# Patient Record
Sex: Female | Born: 1935 | Race: White | Hispanic: No | Smoking: Former smoker
Health system: Southern US, Community
[De-identification: ages and names within clinical notes are randomized; demographics above are authoritative.]

## PROBLEM LIST (undated history)

## (undated) DIAGNOSIS — E039 Hypothyroidism, unspecified: Secondary | ICD-10-CM

## (undated) DIAGNOSIS — I5042 Chronic combined systolic (congestive) and diastolic (congestive) heart failure: Secondary | ICD-10-CM

## (undated) DIAGNOSIS — I4891 Unspecified atrial fibrillation: Principal | ICD-10-CM

## (undated) DIAGNOSIS — K648 Other hemorrhoids: Secondary | ICD-10-CM

## (undated) DIAGNOSIS — M1990 Unspecified osteoarthritis, unspecified site: Secondary | ICD-10-CM

## (undated) DIAGNOSIS — N183 Chronic kidney disease, stage 3 unspecified: Secondary | ICD-10-CM

## (undated) DIAGNOSIS — S6291XA Unspecified fracture of right wrist and hand, initial encounter for closed fracture: Secondary | ICD-10-CM

## (undated) DIAGNOSIS — N63 Unspecified lump in unspecified breast: Secondary | ICD-10-CM

## (undated) DIAGNOSIS — N329 Bladder disorder, unspecified: Secondary | ICD-10-CM

## (undated) DIAGNOSIS — K5730 Diverticulosis of large intestine without perforation or abscess without bleeding: Secondary | ICD-10-CM

## (undated) DIAGNOSIS — R040 Epistaxis: Secondary | ICD-10-CM

## (undated) DIAGNOSIS — N3010 Interstitial cystitis (chronic) without hematuria: Secondary | ICD-10-CM

## (undated) DIAGNOSIS — I447 Left bundle-branch block, unspecified: Secondary | ICD-10-CM

## (undated) DIAGNOSIS — I2510 Atherosclerotic heart disease of native coronary artery without angina pectoris: Secondary | ICD-10-CM

## (undated) DIAGNOSIS — Z9889 Other specified postprocedural states: Secondary | ICD-10-CM

## (undated) DIAGNOSIS — I951 Orthostatic hypotension: Secondary | ICD-10-CM

## (undated) DIAGNOSIS — I10 Essential (primary) hypertension: Secondary | ICD-10-CM

## (undated) DIAGNOSIS — E785 Hyperlipidemia, unspecified: Secondary | ICD-10-CM

## (undated) DIAGNOSIS — R001 Bradycardia, unspecified: Secondary | ICD-10-CM

## (undated) DIAGNOSIS — K219 Gastro-esophageal reflux disease without esophagitis: Secondary | ICD-10-CM

## (undated) HISTORY — PX: APPENDECTOMY: SHX54

## (undated) HISTORY — DX: Interstitial cystitis (chronic) without hematuria: N30.10

## (undated) HISTORY — DX: Chronic combined systolic (congestive) and diastolic (congestive) heart failure: I50.42

## (undated) HISTORY — DX: Other specified postprocedural states: Z98.890

## (undated) HISTORY — PX: CHOLECYSTECTOMY: SHX55

## (undated) HISTORY — PX: BREAST SURGERY: SHX581

## (undated) HISTORY — PX: ABDOMINAL HYSTERECTOMY: SHX81

## (undated) HISTORY — DX: Bladder disorder, unspecified: N32.9

## (undated) HISTORY — PX: ANKLE RECONSTRUCTION: SHX1151

## (undated) HISTORY — DX: Gastro-esophageal reflux disease without esophagitis: K21.9

## (undated) HISTORY — PX: WRIST RECONSTRUCTION: SHX2675

## (undated) HISTORY — PX: CATARACT EXTRACTION, BILATERAL: SHX1313

## (undated) HISTORY — DX: Unspecified fracture of right wrist and hand, initial encounter for closed fracture: S62.91XA

## (undated) HISTORY — PX: TONSILLECTOMY: SHX5217

## (undated) HISTORY — DX: Chronic kidney disease, stage 3 (moderate): N18.3

## (undated) HISTORY — DX: Essential (primary) hypertension: I10

## (undated) HISTORY — DX: Unspecified lump in unspecified breast: N63.0

## (undated) HISTORY — DX: Atherosclerotic heart disease of native coronary artery without angina pectoris: I25.10

## (undated) HISTORY — DX: Hypothyroidism, unspecified: E03.9

## (undated) HISTORY — DX: Other hemorrhoids: K64.8

## (undated) HISTORY — DX: Epistaxis: R04.0

## (undated) HISTORY — DX: Chronic kidney disease, stage 3 unspecified: N18.30

## (undated) HISTORY — DX: Diverticulosis of large intestine without perforation or abscess without bleeding: K57.30

## (undated) HISTORY — DX: Hyperlipidemia, unspecified: E78.5

## (undated) HISTORY — DX: Unspecified osteoarthritis, unspecified site: M19.90

---

## 1998-12-14 ENCOUNTER — Encounter: Payer: Self-pay | Admitting: Emergency Medicine

## 1998-12-14 ENCOUNTER — Emergency Department (HOSPITAL_COMMUNITY): Admission: EM | Admit: 1998-12-14 | Discharge: 1998-12-14 | Payer: Self-pay | Admitting: Emergency Medicine

## 2000-02-14 ENCOUNTER — Encounter: Payer: Self-pay | Admitting: *Deleted

## 2000-02-14 ENCOUNTER — Encounter: Admission: RE | Admit: 2000-02-14 | Discharge: 2000-02-14 | Payer: Self-pay | Admitting: *Deleted

## 2000-06-05 ENCOUNTER — Inpatient Hospital Stay (HOSPITAL_COMMUNITY): Admission: AD | Admit: 2000-06-05 | Discharge: 2000-06-09 | Payer: Self-pay | Admitting: Cardiology

## 2000-06-23 ENCOUNTER — Encounter (HOSPITAL_COMMUNITY): Admission: RE | Admit: 2000-06-23 | Discharge: 2000-09-21 | Payer: Self-pay | Admitting: Cardiology

## 2001-03-29 ENCOUNTER — Other Ambulatory Visit: Admission: RE | Admit: 2001-03-29 | Discharge: 2001-03-29 | Payer: Self-pay | Admitting: Gynecology

## 2002-11-01 ENCOUNTER — Ambulatory Visit (HOSPITAL_COMMUNITY): Admission: RE | Admit: 2002-11-01 | Discharge: 2002-11-02 | Payer: Self-pay | Admitting: Cardiology

## 2003-09-08 ENCOUNTER — Ambulatory Visit (HOSPITAL_COMMUNITY): Admission: RE | Admit: 2003-09-08 | Discharge: 2003-09-08 | Payer: Self-pay | Admitting: Cardiology

## 2003-10-03 ENCOUNTER — Encounter: Payer: Self-pay | Admitting: Gastroenterology

## 2003-11-30 ENCOUNTER — Emergency Department (HOSPITAL_COMMUNITY): Admission: EM | Admit: 2003-11-30 | Discharge: 2003-12-01 | Payer: Self-pay | Admitting: Emergency Medicine

## 2003-12-01 ENCOUNTER — Inpatient Hospital Stay (HOSPITAL_COMMUNITY): Admission: EM | Admit: 2003-12-01 | Discharge: 2003-12-11 | Payer: Self-pay | Admitting: Specialist

## 2004-04-03 ENCOUNTER — Other Ambulatory Visit: Admission: RE | Admit: 2004-04-03 | Discharge: 2004-04-03 | Payer: Self-pay | Admitting: Obstetrics and Gynecology

## 2004-09-24 ENCOUNTER — Ambulatory Visit: Payer: Self-pay | Admitting: Internal Medicine

## 2004-12-19 ENCOUNTER — Ambulatory Visit: Payer: Self-pay | Admitting: Internal Medicine

## 2005-02-20 ENCOUNTER — Ambulatory Visit: Payer: Self-pay | Admitting: Internal Medicine

## 2005-03-18 ENCOUNTER — Ambulatory Visit: Payer: Self-pay | Admitting: Internal Medicine

## 2005-03-20 ENCOUNTER — Ambulatory Visit: Payer: Self-pay | Admitting: Cardiology

## 2005-03-21 ENCOUNTER — Ambulatory Visit: Payer: Self-pay

## 2005-03-27 ENCOUNTER — Ambulatory Visit: Payer: Self-pay | Admitting: Cardiology

## 2005-07-15 ENCOUNTER — Ambulatory Visit: Payer: Self-pay | Admitting: Cardiology

## 2005-07-22 ENCOUNTER — Ambulatory Visit: Payer: Self-pay | Admitting: Cardiology

## 2005-08-18 ENCOUNTER — Ambulatory Visit: Payer: Self-pay | Admitting: Internal Medicine

## 2005-09-17 ENCOUNTER — Ambulatory Visit: Payer: Self-pay | Admitting: Internal Medicine

## 2005-09-18 ENCOUNTER — Ambulatory Visit: Payer: Self-pay | Admitting: Cardiology

## 2005-10-01 ENCOUNTER — Ambulatory Visit: Payer: Self-pay | Admitting: Gastroenterology

## 2005-10-16 ENCOUNTER — Ambulatory Visit: Payer: Self-pay | Admitting: Gastroenterology

## 2005-10-16 ENCOUNTER — Encounter (INDEPENDENT_AMBULATORY_CARE_PROVIDER_SITE_OTHER): Payer: Self-pay | Admitting: *Deleted

## 2005-11-19 ENCOUNTER — Ambulatory Visit: Payer: Self-pay | Admitting: Internal Medicine

## 2005-11-21 ENCOUNTER — Ambulatory Visit: Payer: Self-pay | Admitting: Internal Medicine

## 2005-12-22 ENCOUNTER — Ambulatory Visit: Payer: Self-pay | Admitting: Internal Medicine

## 2006-03-23 ENCOUNTER — Ambulatory Visit: Payer: Self-pay | Admitting: Internal Medicine

## 2006-03-25 ENCOUNTER — Ambulatory Visit: Payer: Self-pay | Admitting: Cardiology

## 2006-05-25 ENCOUNTER — Ambulatory Visit: Payer: Self-pay | Admitting: Internal Medicine

## 2006-07-14 ENCOUNTER — Ambulatory Visit: Payer: Self-pay | Admitting: Internal Medicine

## 2006-07-17 ENCOUNTER — Ambulatory Visit: Payer: Self-pay | Admitting: Internal Medicine

## 2006-09-17 ENCOUNTER — Ambulatory Visit: Payer: Self-pay | Admitting: Internal Medicine

## 2006-11-17 ENCOUNTER — Ambulatory Visit: Payer: Self-pay | Admitting: Internal Medicine

## 2006-11-17 LAB — CONVERTED CEMR LAB
BUN: 28 mg/dL — ABNORMAL HIGH (ref 6–23)
Basophils Absolute: 0 10*3/uL (ref 0.0–0.1)
Basophils Relative: 0.8 % (ref 0.0–1.0)
Creatinine, Ser: 1.3 mg/dL — ABNORMAL HIGH (ref 0.4–1.2)
Eosinophil percent: 2.3 % (ref 0.0–5.0)
HCT: 37 % (ref 36.0–46.0)
Hemoglobin: 12.5 g/dL (ref 12.0–15.0)
Lymphocytes Relative: 31.2 % (ref 12.0–46.0)
MCHC: 33.7 g/dL (ref 30.0–36.0)
MCV: 87 fL (ref 78.0–100.0)
Monocytes Absolute: 0.4 10*3/uL (ref 0.2–0.7)
Monocytes Relative: 7.4 % (ref 3.0–11.0)
Neutro Abs: 3.1 10*3/uL (ref 1.4–7.7)
Neutrophils Relative %: 58.3 % (ref 43.0–77.0)
Platelets: 221 10*3/uL (ref 150–400)
Potassium: 3.6 meq/L (ref 3.5–5.1)
RBC: 4.25 M/uL (ref 3.87–5.11)
RDW: 12.5 % (ref 11.5–14.6)
TSH: 0.48 microintl units/mL (ref 0.35–5.50)
WBC: 5.3 10*3/uL (ref 4.5–10.5)

## 2007-02-24 ENCOUNTER — Ambulatory Visit: Payer: Self-pay | Admitting: Internal Medicine

## 2007-06-07 ENCOUNTER — Encounter: Payer: Self-pay | Admitting: Internal Medicine

## 2007-08-03 ENCOUNTER — Ambulatory Visit: Payer: Self-pay | Admitting: Internal Medicine

## 2007-08-03 DIAGNOSIS — E039 Hypothyroidism, unspecified: Secondary | ICD-10-CM

## 2007-08-03 DIAGNOSIS — T887XXA Unspecified adverse effect of drug or medicament, initial encounter: Secondary | ICD-10-CM

## 2007-08-03 DIAGNOSIS — I25119 Atherosclerotic heart disease of native coronary artery with unspecified angina pectoris: Secondary | ICD-10-CM

## 2007-08-03 DIAGNOSIS — Z8719 Personal history of other diseases of the digestive system: Secondary | ICD-10-CM

## 2007-08-03 DIAGNOSIS — N3289 Other specified disorders of bladder: Secondary | ICD-10-CM

## 2007-08-03 DIAGNOSIS — I10 Essential (primary) hypertension: Secondary | ICD-10-CM | POA: Insufficient documentation

## 2007-08-03 DIAGNOSIS — K5730 Diverticulosis of large intestine without perforation or abscess without bleeding: Secondary | ICD-10-CM | POA: Insufficient documentation

## 2007-08-03 HISTORY — DX: Essential (primary) hypertension: I10

## 2007-08-06 ENCOUNTER — Telehealth: Payer: Self-pay | Admitting: Internal Medicine

## 2007-08-06 ENCOUNTER — Encounter: Payer: Self-pay | Admitting: Internal Medicine

## 2007-08-31 ENCOUNTER — Ambulatory Visit (HOSPITAL_COMMUNITY): Admission: RE | Admit: 2007-08-31 | Discharge: 2007-08-31 | Payer: Self-pay | Admitting: Urology

## 2007-08-31 ENCOUNTER — Encounter: Payer: Self-pay | Admitting: Urology

## 2007-09-09 ENCOUNTER — Ambulatory Visit: Payer: Self-pay | Admitting: Internal Medicine

## 2007-09-16 ENCOUNTER — Encounter: Payer: Self-pay | Admitting: Internal Medicine

## 2007-09-30 ENCOUNTER — Telehealth: Payer: Self-pay | Admitting: Internal Medicine

## 2007-10-15 ENCOUNTER — Encounter: Payer: Self-pay | Admitting: Internal Medicine

## 2007-10-18 ENCOUNTER — Ambulatory Visit: Payer: Self-pay | Admitting: Internal Medicine

## 2007-10-18 DIAGNOSIS — K219 Gastro-esophageal reflux disease without esophagitis: Secondary | ICD-10-CM

## 2007-11-23 ENCOUNTER — Ambulatory Visit: Payer: Self-pay | Admitting: Internal Medicine

## 2007-11-23 LAB — CONVERTED CEMR LAB: TSH: 0.64 microintl units/mL (ref 0.35–5.50)

## 2008-01-14 ENCOUNTER — Encounter: Payer: Self-pay | Admitting: Internal Medicine

## 2008-02-22 ENCOUNTER — Ambulatory Visit: Payer: Self-pay | Admitting: Internal Medicine

## 2008-02-22 DIAGNOSIS — R197 Diarrhea, unspecified: Secondary | ICD-10-CM

## 2008-03-07 ENCOUNTER — Ambulatory Visit: Payer: Self-pay | Admitting: Internal Medicine

## 2008-03-07 DIAGNOSIS — E785 Hyperlipidemia, unspecified: Secondary | ICD-10-CM | POA: Insufficient documentation

## 2008-03-07 HISTORY — DX: Hyperlipidemia, unspecified: E78.5

## 2008-04-05 ENCOUNTER — Encounter: Admission: RE | Admit: 2008-04-05 | Discharge: 2008-04-05 | Payer: Self-pay | Admitting: Obstetrics and Gynecology

## 2008-04-11 ENCOUNTER — Encounter: Admission: RE | Admit: 2008-04-11 | Discharge: 2008-04-11 | Payer: Self-pay | Admitting: Obstetrics and Gynecology

## 2008-04-17 ENCOUNTER — Encounter: Payer: Self-pay | Admitting: Internal Medicine

## 2008-08-14 ENCOUNTER — Telehealth: Payer: Self-pay | Admitting: Internal Medicine

## 2008-08-15 ENCOUNTER — Telehealth (INDEPENDENT_AMBULATORY_CARE_PROVIDER_SITE_OTHER): Payer: Self-pay | Admitting: *Deleted

## 2008-08-16 ENCOUNTER — Ambulatory Visit: Payer: Self-pay | Admitting: Internal Medicine

## 2008-08-16 LAB — CONVERTED CEMR LAB
BUN: 23 mg/dL (ref 6–23)
Basophils Absolute: 0 10*3/uL (ref 0.0–0.1)
Basophils Relative: 0.9 % (ref 0.0–3.0)
CO2: 30 meq/L (ref 19–32)
Calcium: 9.2 mg/dL (ref 8.4–10.5)
Chloride: 108 meq/L (ref 96–112)
Creatinine, Ser: 1.1 mg/dL (ref 0.4–1.2)
Eosinophils Absolute: 0.2 10*3/uL (ref 0.0–0.7)
Eosinophils Relative: 3.5 % (ref 0.0–5.0)
GFR calc Af Amer: 61 mL/min
GFR calc non Af Amer: 51 mL/min
Glucose, Bld: 103 mg/dL — ABNORMAL HIGH (ref 70–99)
HCT: 35.5 % — ABNORMAL LOW (ref 36.0–46.0)
Hemoglobin: 12.2 g/dL (ref 12.0–15.0)
Lymphocytes Relative: 25.3 % (ref 12.0–46.0)
MCHC: 34.3 g/dL (ref 30.0–36.0)
MCV: 87.8 fL (ref 78.0–100.0)
Monocytes Absolute: 0.4 10*3/uL (ref 0.1–1.0)
Monocytes Relative: 7.4 % (ref 3.0–12.0)
Neutro Abs: 3.1 10*3/uL (ref 1.4–7.7)
Neutrophils Relative %: 62.9 % (ref 43.0–77.0)
Platelets: 188 10*3/uL (ref 150–400)
Potassium: 3.8 meq/L (ref 3.5–5.1)
RBC: 4.04 M/uL (ref 3.87–5.11)
RDW: 12.5 % (ref 11.5–14.6)
Sodium: 144 meq/L (ref 135–145)
TSH: 0.68 microintl units/mL (ref 0.35–5.50)
WBC: 5 10*3/uL (ref 4.5–10.5)

## 2008-08-24 ENCOUNTER — Ambulatory Visit: Payer: Self-pay

## 2008-08-25 ENCOUNTER — Encounter: Payer: Self-pay | Admitting: Internal Medicine

## 2008-08-25 ENCOUNTER — Ambulatory Visit: Payer: Self-pay | Admitting: Internal Medicine

## 2008-08-25 LAB — HM DEXA SCAN

## 2008-08-29 ENCOUNTER — Telehealth: Payer: Self-pay | Admitting: Internal Medicine

## 2008-09-11 ENCOUNTER — Encounter: Payer: Self-pay | Admitting: Internal Medicine

## 2008-10-02 ENCOUNTER — Encounter: Payer: Self-pay | Admitting: Internal Medicine

## 2008-10-12 ENCOUNTER — Encounter: Payer: Self-pay | Admitting: Internal Medicine

## 2008-11-15 ENCOUNTER — Ambulatory Visit: Payer: Self-pay | Admitting: Internal Medicine

## 2008-11-16 ENCOUNTER — Encounter: Payer: Self-pay | Admitting: Internal Medicine

## 2008-11-16 ENCOUNTER — Ambulatory Visit: Payer: Self-pay | Admitting: Gastroenterology

## 2008-11-30 ENCOUNTER — Telehealth: Payer: Self-pay | Admitting: Internal Medicine

## 2008-12-21 ENCOUNTER — Ambulatory Visit: Payer: Self-pay | Admitting: Internal Medicine

## 2008-12-21 DIAGNOSIS — R040 Epistaxis: Secondary | ICD-10-CM | POA: Insufficient documentation

## 2008-12-21 LAB — CONVERTED CEMR LAB
BUN: 33 mg/dL — ABNORMAL HIGH (ref 6–23)
CO2: 27 meq/L (ref 19–32)
Calcium: 9.5 mg/dL (ref 8.4–10.5)
Chloride: 105 meq/L (ref 96–112)
Cholesterol: 131 mg/dL (ref 0–200)
Creatinine, Ser: 1.1 mg/dL (ref 0.4–1.2)
Direct LDL: 60.8 mg/dL
GFR calc Af Amer: 61 mL/min
GFR calc non Af Amer: 51 mL/min
Glucose, Bld: 88 mg/dL (ref 70–99)
HDL: 48.1 mg/dL (ref >39.0)
Potassium: 4 meq/L (ref 3.5–5.1)
Sodium: 140 meq/L (ref 135–145)
TSH: 0.92 microintl units/mL (ref 0.35–5.50)

## 2009-01-29 ENCOUNTER — Encounter: Payer: Self-pay | Admitting: Internal Medicine

## 2009-02-02 ENCOUNTER — Encounter: Payer: Self-pay | Admitting: Internal Medicine

## 2009-02-17 ENCOUNTER — Encounter: Payer: Self-pay | Admitting: Internal Medicine

## 2009-02-19 ENCOUNTER — Ambulatory Visit: Payer: Self-pay | Admitting: Internal Medicine

## 2009-02-19 DIAGNOSIS — R609 Edema, unspecified: Secondary | ICD-10-CM | POA: Insufficient documentation

## 2009-03-21 ENCOUNTER — Ambulatory Visit: Payer: Self-pay | Admitting: Internal Medicine

## 2009-04-12 ENCOUNTER — Encounter: Admission: RE | Admit: 2009-04-12 | Discharge: 2009-04-12 | Payer: Self-pay | Admitting: Internal Medicine

## 2009-04-16 ENCOUNTER — Encounter: Payer: Self-pay | Admitting: Internal Medicine

## 2009-05-23 ENCOUNTER — Ambulatory Visit: Payer: Self-pay | Admitting: Internal Medicine

## 2009-05-23 DIAGNOSIS — Z9889 Other specified postprocedural states: Secondary | ICD-10-CM

## 2009-05-23 HISTORY — DX: Other specified postprocedural states: Z98.890

## 2009-07-24 ENCOUNTER — Ambulatory Visit: Payer: Self-pay | Admitting: Internal Medicine

## 2009-09-17 ENCOUNTER — Ambulatory Visit: Payer: Self-pay | Admitting: Internal Medicine

## 2009-09-17 LAB — CONVERTED CEMR LAB
ALT: 19 units/L (ref 0–35)
AST: 26 units/L (ref 0–37)
Albumin: 4 g/dL (ref 3.5–5.2)
Alkaline Phosphatase: 73 units/L (ref 39–117)
BUN: 20 mg/dL (ref 6–23)
Bilirubin, Direct: 0.1 mg/dL (ref 0.0–0.3)
CO2: 27 meq/L (ref 19–32)
Calcium: 9.7 mg/dL (ref 8.4–10.5)
Chloride: 108 meq/L (ref 96–112)
Cholesterol: 143 mg/dL (ref 0–200)
Creatinine, Ser: 1 mg/dL (ref 0.4–1.2)
GFR calc non Af Amer: 56.31 mL/min (ref >60)
Glucose, Bld: 110 mg/dL — ABNORMAL HIGH (ref 70–99)
HDL: 53.4 mg/dL (ref >39.00)
LDL Cholesterol: 70 mg/dL (ref 0–99)
Potassium: 4.3 meq/L (ref 3.5–5.1)
Sodium: 144 meq/L (ref 135–145)
TSH: 1.09 microintl units/mL (ref 0.35–5.50)
Total Bilirubin: 0.8 mg/dL (ref 0.3–1.2)
Total CHOL/HDL Ratio: 3
Total Protein: 7.5 g/dL (ref 6.0–8.3)
Triglycerides: 99 mg/dL (ref 0.0–149.0)
VLDL: 19.8 mg/dL (ref 0.0–40.0)

## 2009-09-25 ENCOUNTER — Ambulatory Visit: Payer: Self-pay | Admitting: Internal Medicine

## 2009-10-17 ENCOUNTER — Encounter (INDEPENDENT_AMBULATORY_CARE_PROVIDER_SITE_OTHER): Payer: Self-pay | Admitting: *Deleted

## 2009-10-23 ENCOUNTER — Encounter (INDEPENDENT_AMBULATORY_CARE_PROVIDER_SITE_OTHER): Payer: Self-pay | Admitting: *Deleted

## 2009-12-25 ENCOUNTER — Ambulatory Visit: Payer: Self-pay | Admitting: Internal Medicine

## 2010-02-06 ENCOUNTER — Telehealth: Payer: Self-pay | Admitting: Internal Medicine

## 2010-02-19 ENCOUNTER — Ambulatory Visit: Payer: Self-pay | Admitting: Internal Medicine

## 2010-02-19 LAB — CONVERTED CEMR LAB
BUN: 17 mg/dL (ref 6–23)
Basophils Absolute: 0 10*3/uL (ref 0.0–0.1)
Basophils Relative: 0.4 % (ref 0.0–3.0)
CO2: 30 meq/L (ref 19–32)
Calcium: 9.3 mg/dL (ref 8.4–10.5)
Chloride: 107 meq/L (ref 96–112)
Cholesterol: 140 mg/dL (ref 0–200)
Creatinine, Ser: 0.9 mg/dL (ref 0.4–1.2)
Direct LDL: 81 mg/dL
Eosinophils Absolute: 0.3 10*3/uL (ref 0.0–0.7)
Eosinophils Relative: 4.9 % (ref 0.0–5.0)
GFR calc non Af Amer: 63.52 mL/min (ref >60)
Glucose, Bld: 100 mg/dL — ABNORMAL HIGH (ref 70–99)
HCT: 36.8 % (ref 36.0–46.0)
HDL: 55.4 mg/dL (ref >39.00)
Hemoglobin: 12.5 g/dL (ref 12.0–15.0)
Lymphocytes Relative: 24.9 % (ref 12.0–46.0)
Lymphs Abs: 1.7 10*3/uL (ref 0.7–4.0)
MCHC: 34.1 g/dL (ref 30.0–36.0)
MCV: 87.5 fL (ref 78.0–100.0)
Monocytes Absolute: 0.4 10*3/uL (ref 0.1–1.0)
Monocytes Relative: 6.3 % (ref 3.0–12.0)
Neutro Abs: 4.2 10*3/uL (ref 1.4–7.7)
Neutrophils Relative %: 63.5 % (ref 43.0–77.0)
Platelets: 217 10*3/uL (ref 150.0–400.0)
Potassium: 3.6 meq/L (ref 3.5–5.1)
RBC: 4.21 M/uL (ref 3.87–5.11)
RDW: 14.6 % (ref 11.5–14.6)
Sodium: 145 meq/L (ref 135–145)
TSH: 0.85 microintl units/mL (ref 0.35–5.50)
WBC: 6.7 10*3/uL (ref 4.5–10.5)

## 2010-04-09 ENCOUNTER — Telehealth: Payer: Self-pay | Admitting: Internal Medicine

## 2010-04-16 ENCOUNTER — Encounter: Admission: RE | Admit: 2010-04-16 | Discharge: 2010-04-16 | Payer: Self-pay | Admitting: Internal Medicine

## 2010-04-17 ENCOUNTER — Encounter: Payer: Self-pay | Admitting: Internal Medicine

## 2010-05-08 ENCOUNTER — Telehealth: Payer: Self-pay | Admitting: Internal Medicine

## 2010-05-31 ENCOUNTER — Ambulatory Visit: Payer: Self-pay | Admitting: Internal Medicine

## 2010-06-14 ENCOUNTER — Encounter: Payer: Self-pay | Admitting: Internal Medicine

## 2010-07-26 ENCOUNTER — Telehealth: Payer: Self-pay | Admitting: Internal Medicine

## 2010-09-04 ENCOUNTER — Ambulatory Visit: Payer: Self-pay | Admitting: Internal Medicine

## 2010-09-04 LAB — CONVERTED CEMR LAB
ALT: 13 units/L (ref 0–35)
AST: 18 units/L (ref 0–37)
Albumin: 3.5 g/dL (ref 3.5–5.2)
Alkaline Phosphatase: 76 units/L (ref 39–117)
BUN: 14 mg/dL (ref 6–23)
Bilirubin, Direct: 0.1 mg/dL (ref 0.0–0.3)
CO2: 27 meq/L (ref 19–32)
Calcium: 8.9 mg/dL (ref 8.4–10.5)
Chloride: 105 meq/L (ref 96–112)
Cholesterol: 133 mg/dL (ref 0–200)
Creatinine, Ser: 1 mg/dL (ref 0.4–1.2)
GFR calc non Af Amer: 56.83 mL/min (ref >60)
Glucose, Bld: 97 mg/dL (ref 70–99)
HDL: 40.9 mg/dL (ref >39.00)
LDL Cholesterol: 73 mg/dL (ref 0–99)
Potassium: 4.2 meq/L (ref 3.5–5.1)
Sodium: 140 meq/L (ref 135–145)
TSH: 1.22 microintl units/mL (ref 0.35–5.50)
Total Bilirubin: 0.5 mg/dL (ref 0.3–1.2)
Total CHOL/HDL Ratio: 3
Total Protein: 6.4 g/dL (ref 6.0–8.3)
Triglycerides: 98 mg/dL (ref 0.0–149.0)
VLDL: 19.6 mg/dL (ref 0.0–40.0)

## 2010-09-11 ENCOUNTER — Ambulatory Visit: Payer: Self-pay | Admitting: Internal Medicine

## 2010-09-20 ENCOUNTER — Encounter: Payer: Self-pay | Admitting: Internal Medicine

## 2010-11-19 ENCOUNTER — Encounter: Payer: Self-pay | Admitting: *Deleted

## 2010-12-01 ENCOUNTER — Encounter: Payer: Self-pay | Admitting: Internal Medicine

## 2010-12-10 NOTE — Progress Notes (Signed)
Summary: call  Phone Note From Pharmacy   Caller: [facility] [phone] 8-7:30EST M-F Summary of Call: Ref # [id number].  Nexium 40mg  caps 3 stripe.  By 6-2 2pm to avaoid cancellation of RX.  Pharmacist notes that lansoprazole has just been refilled.  Nexium not on file.  He will not fill it.  Proble old Rx.  Rudy Jew, RN  Apr 09, 2010 5:04 PM  Initial call taken by: Rudy Jew, RN,  Apr 09, 2010 11:49 AM
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
## 2010-12-10 NOTE — Letter (Signed)
Summary: [facility]  [facility]   Imported By: Maryln Gottron 04/23/2010 13:40:03  _____________________________________________________________________  External Attachment:    Type:   Image     Comment:   External Document

## 2010-12-10 NOTE — Assessment & Plan Note (Signed)
Summary: 3 month rov/njr   Vital Signs:  Patient profile:   77 year old female Height:      66 inches Weight:      164 pounds BMI:     26.57 Temp:     98.2 degrees F oral Pulse rate:   72 / minute Resp:     14 per minute BP sitting:   140 / 70  (left arm)  Vitals Entered By: Willy Eddy, LPN (December 25, 2009 2:11 PM) CC: roa, Hypertension Management   Primary Care Provider:  Darryll Capers, MD  CC:  roa and Hypertension Management.  History of Present Illness: Weight is stable no signs of CHF ( the temporay swelling in her feet has improved) angina is stable takes a NTG  rarely ( had an episode where took them weekly but a shot in back stopped this) refills and reveiw of cholestrol and hypertensive meds as well asn synthjoid and NTG    Hypertension History:      She denies headache, chest pain, palpitations, dyspnea with exertion, orthopnea, PND, peripheral edema, visual symptoms, neurologic problems, syncope, and side effects from treatment.        Positive major cardiovascular risk factors include female age 14 years old or older, hyperlipidemia, and hypertension.  Negative major cardiovascular risk factors include negative family history for ischemic heart disease and non-tobacco-user status.        Positive history for target organ damage include ASHD (either angina/prior MI/prior CABG) and cardiac end organ damage (either CHF or LVH).     Preventive Screening-Counseling & Management  Alcohol-Tobacco     Smoking Status: quit     Passive Smoke Exposure: no  Problems Prior to Update: 1)  Acute On Chronic Systolic Heart Failure  (ICD-428.23) 2)  Hx of Breast Mass, Benign  (ICD-611.72) 3)  Edema  (ICD-782.3) 4)  Epistaxis, Recurrent  (ICD-784.7) 5)  Colorectal Cancer, Family Hx  (ICD-V16.0) 6)  Fracture, Right Hand  (ICD-815.00) 7)  Fracture, Hand  (ICD-815.00) 8)  Hyperlipidemia  (ICD-272.4) 9)  Diarrhea  (ICD-787.91) 10)  Esophageal Reflux   (ICD-530.81) 11)  Hypothyroidism  (ICD-244.9) 12)  Disorder, Bladder Nec  (ICD-596.8) 13)  Advef, Drug/medicinal/biological Subst Nos  (ICD-995.20) 14)  Coronary Artery Disease  (ICD-414.00) 15)  Diverticulosis, Colon  (ICD-562.10) 16)  Barrett's Esophagus, Hx of  (ICD-V12.79) 17)  Hypertension  (ICD-401.9)  Current Problems (verified): 1)  Acute On Chronic Systolic Heart Failure  (ICD-428.23) 2)  Hx of Breast Mass, Benign  (ICD-611.72) 3)  Edema  (ICD-782.3) 4)  Epistaxis, Recurrent  (ICD-784.7) 5)  Colorectal Cancer, Family Hx  (ICD-V16.0) 6)  Fracture, Right Hand  (ICD-815.00) 7)  Fracture, Hand  (ICD-815.00) 8)  Hyperlipidemia  (ICD-272.4) 9)  Diarrhea  (ICD-787.91) 10)  Esophageal Reflux  (ICD-530.81) 11)  Hypothyroidism  (ICD-244.9) 12)  Disorder, Bladder Nec  (ICD-596.8) 13)  Advef, Drug/medicinal/biological Subst Nos  (ICD-995.20) 14)  Coronary Artery Disease  (ICD-414.00) 15)  Diverticulosis, Colon  (ICD-562.10) 16)  Barrett's Esophagus, Hx of  (ICD-V12.79) 17)  Hypertension  (ICD-401.9)  Medications Prior to Update: 1)  Ditropan Xl 10 Mg  Tb24 (Oxybutynin Chloride) .... Once Daily 2)  Aspirin Ec 81 Mg Tbec (Aspirin) .... Take 1 Tablet By Mouth Once A Day 3)  Isosorbide Mononitrate Cr 60 Mg Xr24h-Tab (Isosorbide Mononitrate) .[name].. 1 Two Times A Day Pt Request [place] Tabs 4)  Klor-Con 10 10 Meq Tbcr (Potassium Chloride) .... Take 1 Tablet By Mouth Once A Day 5)  Nitroquick 0.4 Mg Subl (Nitroglycerin) .... As Needed 6)  Synthroid 50 Mcg Tabs (Levothyroxine Sodium) .... Take 1 Tablet By Mouth Once A Day 7)  Furosemide 40 Mg Tabs (Furosemide) .... One By Mouth Daily 8)  Bystolic 5 Mg Tabs (Nebivolol Hcl) .... One By Mouth Daly 9)  Multivitamins   Tabs (Multiple Vitamin) .... Once Daily 10)  Calcium 600/vitamin D 600-200 Mg-Unit  Tabs (Calcium Carbonate-Vitamin D) .... Two Times A Day 11)  Elmiron 100 Mg  Caps (Pentosan Polysulfate Sodium) .... One Tid 12)  Nexium 40 Mg   Cpdr (Esomeprazole Magnesium) .... One By Mouth Daily 13)  Exforge 5-320 Mg Tabs (Amlodipine Besylate-Valsartan) .... One By Mouth Daily 14)  Simvastatin 40 Mg Tabs (Simvastatin) .[name].. 1 Once Daily 15)  Tramadol Hcl 50 Mg Tabs (Tramadol Hcl) .... One By Mouth Q 4 Hour As Needed Pain  Current Medications (verified): 1)  Ditropan Xl 10 Mg  Tb24 (Oxybutynin Chloride) .... Once Daily 2)  Aspirin Ec 81 Mg Tbec (Aspirin) .... Take 1 Tablet By Mouth Once A Day 3)  Isosorbide Mononitrate Cr 60 Mg Xr24h-Tab (Isosorbide Mononitrate) .[name].. 1 Two Times A Day Pt Request [place] Tabs 4)  Klor-Con 10 10 Meq Tbcr (Potassium Chloride) .... Take 1 Tablet By Mouth Once A Day 5)  Nitroquick 0.4 Mg Subl (Nitroglycerin) .... As Needed 6)  Synthroid 50 Mcg Tabs (Levothyroxine Sodium) .... Take 1 Tablet By Mouth Once A Day 7)  Furosemide 40 Mg Tabs (Furosemide) .... One By Mouth Daily 8)  Bystolic 5 Mg Tabs (Nebivolol Hcl) .... One By Mouth Daly 9)  Multivitamins   Tabs (Multiple Vitamin) .... Once Daily 10)  Calcium 600/vitamin D 600-200 Mg-Unit  Tabs (Calcium Carbonate-Vitamin D) .... Two Times A Day 11)  Elmiron 100 Mg  Caps (Pentosan Polysulfate Sodium) .... One Tid 12)  Nexium 40 Mg  Cpdr (Esomeprazole Magnesium) .... One By Mouth Daily 13)  Exforge 5-320 Mg Tabs (Amlodipine Besylate-Valsartan) .... One By Mouth Daily 14)  Simvastatin 40 Mg Tabs (Simvastatin) .[name].. 1 Once Daily 15)  Tramadol Hcl 50 Mg Tabs (Tramadol Hcl) .... One By Mouth Q 4 Hour As Needed Pain  Allergies (verified): 1)  ! Codeine 2)  ! Sulfa  Past History:  Family History: Last updated: 11/16/2008 Family History Hypertension Family History of Colon Cancer: Mother Family History of Colon Polyps: Sister Family History of Diabetes: Son Family History of Heart Disease: Mother, Father, Brother x2, Sister  Social History: Last updated: 11/16/2008 Retired Married Alcohol use-no Patient is a former smoker. -stopped 40 years  ago Illicit Drug Use - no Patient does not get regular exercise.   Risk Factors: Exercise: no (11/16/2008)  Risk Factors: Smoking Status: quit (12/25/2009) Passive Smoke Exposure: no (12/25/2009)  Past medical, surgical, family and social histories (including risk factors) reviewed, and no changes noted (except as noted below).  Past Medical History: Reviewed history from 11/16/2008 and no changes required. GERD Hypertension Diverticulosis Internal hemorrhoids Coronary artery disease Hypothyroidism Hyperlipidemia Arthritis Interstitial Cystitis Pneumonia  Past Surgical History: Reviewed history from 11/16/2008 and no changes required. stent 06/08/00 Hysterectomy Cholecystectomy C-section Appendectomy Cataract Extraction-bilaterally Tonsillectomy  Family History: Reviewed history from 11/16/2008 and no changes required. Family History Hypertension Family History of Colon Cancer: Mother Family History of Colon Polyps: Sister Family History of Diabetes: Son Family History of Heart Disease: Mother, Father, Brother x2, Sister  Social History: Reviewed history from 11/16/2008 and no changes required. Retired Married Alcohol use-no Patient is a former smoker. -stopped  40 years ago Illicit Drug Use - no Patient does not get regular exercise.   Review of Systems  The patient denies anorexia, fever, weight loss, weight gain, vision loss, decreased hearing, hoarseness, chest pain, syncope, dyspnea on exertion, peripheral edema, prolonged cough, headaches, hemoptysis, abdominal pain, melena, hematochezia, severe indigestion/heartburn, hematuria, incontinence, genital sores, muscle weakness, suspicious skin lesions, transient blindness, difficulty walking, depression, unusual weight change, abnormal bleeding, enlarged lymph nodes, angioedema, and breast masses.    Physical Exam  General:  Well developed, well nourished, no acute distress. Head:  Normocephalic and  atraumatic. Ears:  Normal auditory acuity. Nose:  no external deformity and no nasal discharge.   Neck:  Supple; no masses or thyromegaly. Lungs:  normal respiratory effort and no crackles.   Heart:  normal rate and bradycardia.     Impression & Recommendations:  Problem # 1:  ACUTE ON CHRONIC SYSTOLIC HEART FAILURE (ICD-428.23) stable with no signs on weight changes! Her updated medication list for this problem includes:    Aspirin Ec 81 Mg Tbec (Aspirin) .[name]... Take 1 tablet by mouth once a day    Furosemide 40 Mg Tabs (Furosemide) ..... One by mouth daily    Bystolic 5 Mg Tabs (Nebivolol hcl) ..... One by mouth daly    Exforge 5-320 Mg Tabs (Amlodipine besylate-valsartan) ..... One by mouth daily  Problem # 2:  ESOPHAGEAL REFLUX (ICD-530.81) stable when watches food intake  chajge med to generic for saving Her updated medication list for this problem includes:    Lansoprazole 30 Mg Tbdp (Lansoprazole) ..... One by mouth two times a day  EGD: Findings: Normal  Location: [facility]   (10/16/2005)  Labs Reviewed: Hgb: 12.2 (08/16/2008)   Hct: 35.5 (08/16/2008)  Problem # 3:  HYPERTENSION (ICD-401.9) stable blood pressure Her updated medication list for this problem includes:    Furosemide 40 Mg Tabs (Furosemide) ..... One by mouth daily    Bystolic 5 Mg Tabs (Nebivolol hcl) ..... One by mouth daly    Exforge 5-320 Mg Tabs (Amlodipine besylate-valsartan) ..... One by mouth daily  BP today: 140/70 Prior BP: 110/60 (09/25/2009)  Prior 10 Yr Risk Heart Disease: N/A (08/03/2007)  Labs Reviewed: K+: 4.3 (09/17/2009) Creat: : 1.0 (09/17/2009)   Chol: 143 (09/17/2009)   HDL: 53.40 (09/17/2009)   LDL: 70 (09/17/2009)   TG: 99.0 (09/17/2009)  Problem # 4:  HYPOTHYROIDISM (ICD-244.9)  Her updated medication list for this problem includes:    Synthroid 50 Mcg Tabs (Levothyroxine sodium) .[name]... Take 1 tablet by mouth once a day  Labs Reviewed: TSH: 1.09  (09/17/2009)    Chol: 143 (09/17/2009)   HDL: 53.40 (09/17/2009)   LDL: 70 (09/17/2009)   TG: 99.0 (09/17/2009)  Problem # 5:  HYPERLIPIDEMIA (ICD-272.4)  Her updated medication list for this problem includes:    Simvastatin 40 Mg Tabs (Simvastatin) .[name]... 1 once daily  Labs Reviewed: SGOT: 26 (09/17/2009)   SGPT: 19 (09/17/2009)  Prior 10 Yr Risk Heart Disease: N/A (08/03/2007)   HDL:53.40 (09/17/2009), 48.1 (12/21/2008)  LDL:70 (09/17/2009)  Chol:143 (09/17/2009), 131 (12/21/2008)  Trig:99.0 (09/17/2009)  Orders: Prescription Created Electronically [id number])  Complete Medication List: 1)  Ditropan Xl 10 Mg Tb24 (Oxybutynin chloride) .... Once daily 2)  Aspirin Ec 81 Mg Tbec (Aspirin) .... Take 1 tablet by mouth once a day 3)  Isosorbide Mononitrate Cr 60 Mg Xr24h-tab (Isosorbide mononitrate) .[name].. 1 two times a day pt request [place] tabs 4)  Klor-con 10 10 Meq Tbcr (Potassium chloride) .Marland KitchenMarland KitchenMarland Kitchen  Take 1 tablet by mouth once a day 5)  Nitrolingual 0.4 Mg/spray Soln (Nitroglycerin) .... One spray every 5 min as needed chest pain not to exceed three sprays in 24 hours without calling md 6)  Synthroid 50 Mcg Tabs (Levothyroxine sodium) .... Take 1 tablet by mouth once a day 7)  Furosemide 40 Mg Tabs (Furosemide) .... One by mouth daily 8)  Bystolic 5 Mg Tabs (Nebivolol hcl) .... One by mouth daly 9)  Multivitamins Tabs (Multiple vitamin) .... Once daily 10)  Calcium 600/vitamin D 600-200 Mg-unit Tabs (Calcium carbonate-vitamin d) .... Two times a day 11)  Elmiron 100 Mg Caps (Pentosan polysulfate sodium) .... One tid 12)  Lansoprazole 30 Mg Tbdp (Lansoprazole) .... One by mouth two times a day 13)  Exforge 5-320 Mg Tabs (Amlodipine besylate-valsartan) .... One by mouth daily 14)  Simvastatin 40 Mg Tabs (Simvastatin) .[name].. 1 once daily 15)  Tramadol Hcl 50 Mg Tabs (Tramadol hcl) .... One by mouth q 4 hour as needed pain 16)  Meclizine Hcl 25 Mg Tabs (Meclizine hcl) .... One by mouth tid  prn  Hypertension Assessment/Plan:      The patient's hypertensive risk group is category C: Target organ damage and/or diabetes.  Today's blood pressure is 140/70.  Her blood pressure goal is < 140/90.  Patient Instructions: 1)  Please schedule a follow-up appointment in 3 months. morning  fasting Prescriptions: EXFORGE 5-320 MG TABS (AMLODIPINE BESYLATE-VALSARTAN) one by mouth daily  #30 x 11   Entered and Authorized by:   Stacie Glaze MD   Signed by:   Stacie Glaze MD on 12/25/2009   Method used:   Electronically to        [facility]  [address] [id number] (retail)       [address]       [place], [place]  [address]       Ph: [phone] or [phone]       Fax: [phone]   RxID:   [id number] MECLIZINE HCL 25 MG TABS (MECLIZINE HCL) one by mouth tid prn  #30 x 1   Entered and Authorized by:   Stacie Glaze MD   Signed by:   Stacie Glaze MD on 12/25/2009   Method used:   [facility]ronically to        [facility]  [address] [id number] (retail)       [address]       [place], [place]  [address]       Ph: [phone] or [phone]       Fax: [phone]   RxID:   [id number] SIMVASTATIN 40 MG TABS (SIMVASTATIN) 1 once daily  #30 x 11   Entered and Authorized by:   Stacie Glaze MD   Signed by:   Stacie Glaze MD on 12/25/2009   Method used:   [facility]ically to        [facility]  [address] [id number] ([facility]ail)       [address]       [place], [place]  [address]       Ph: [phone] or [phone]       Fax: [phone]   RxID:   [id number] SYNTHROID 50 MCG TABS (LEVOTHYROXINE SODIUM) Take 1 tablet by mouth once a day  #30 x 11   Entered and Authorized by:   Stacie Glaze  MD   Signed by:   Stacie Glaze MD on 12/25/2009   Method used:   [facility]ronically to        [facility]  [address] [id number] ([facility]ail)       [address]       [place], [place]  [address]       Ph: [phone] or [phone]        Fax: [phone]   RxID:   [id number] NITROLINGUAL 0.4 MG/SPRAY SOLN (NITROGLYCERIN) one spray every 5 min as needed chest pain not to exceed three sprays in 24 hours without calling MD  #1 x 11   Entered and Authorized by:   Stacie Glaze MD   Signed by:   Stacie Glaze MD on 12/25/2009   Method used:   [facility]ronically to        [facility]  [address] [id number] ([facility]       [address]       [place], [place]  [address]       Ph: [phone] or [phone]       Fax: [phone]   RxID:   [id number] LANSOPRAZOLE 30 MG TBDP (LANSOPRAZOLE) one by mouth two times a day  #60 x 11   Entered and Authorized by:   Stacie Glaze MD   Signed by:   Stacie Glaze MD on 12/25/2009   Method used:   [facility] to        [facility]  [address] [id number] ([facility]       [address]       [place], [place]  [address]       Ph: [phone] or [phone]       Fax: [phone]   RxID:   [id number]

## 2010-12-10 NOTE — Letter (Signed)
Summary: [facility]  [facility]   Imported By: Maryln Gottron 09/30/2010 10:45:42  _____________________________________________________________________  External Attachment:    Type:   Image     Comment:   External Document

## 2010-12-10 NOTE — Progress Notes (Signed)
Summary: samples needed  Phone Note Call from Patient Call back at Home Phone [phone]   Caller: Patient---live call Reason for Call: Acute Illness Summary of Call: pt has appt on 09-11-2010. Need more Bystolic 5 mg samples until her appt. Initial call taken by: Warnell Forester,  July 26, 2010 10:57 AM  Follow-up for Phone Call        pt informed--meds out front Follow-up by: Willy Eddy, LPN,  July 26, 2010 3:04 PM

## 2010-12-10 NOTE — Assessment & Plan Note (Signed)
Summary: 3 month rov/njr/pt rescd//ccm   Vital Signs:  Patient profile:   77 year old female Height:      68 inches Weight:      164 pounds BMI:     25.03 Temp:     98.2 degrees F oral Pulse rate:   60 / minute Resp:     14 per minute BP sitting:   122 / 74  (left arm)  Vitals Entered By: Willy Eddy, LPN (May 31, 2010 2:41 PM) CC: roa Is Patient Diabetic? No   Visit Type:  [name] is Primary Care Provider:  Darryll Capers, MD  CC:  roa.  History of Present Illness: Victoria Gomez is a pleasant  elderly female with hx of both agina and gerd that is sometimes dificult to diferentiate today she reoprts that she is doing well but has reguired additional NTG upon ocasion Blood pressure is well controlled GERD seen to be stable she has had not signes of CHF ( orthopnea, PND, edema)  Preventive Screening-Counseling & Management  Alcohol-Tobacco     Smoking Status: quit     Passive Smoke Exposure: no  Problems Prior to Update: 1)  Acute On Chronic Systolic Heart Failure  (ICD-428.23) 2)  Hx of Breast Mass, Benign  (ICD-611.72) 3)  Edema  (ICD-782.3) 4)  Epistaxis, Recurrent  (ICD-784.7) 5)  Colorectal Cancer, Family Hx  (ICD-V16.0) 6)  Fracture, Right Hand  (ICD-815.00) 7)  Fracture, Hand  (ICD-815.00) 8)  Hyperlipidemia  (ICD-272.4) 9)  Diarrhea  (ICD-787.91) 10)  Esophageal Reflux  (ICD-530.81) 11)  Hypothyroidism  (ICD-244.9) 12)  Disorder, Bladder Nec  (ICD-596.8) 13)  Advef, Drug/medicinal/biological Subst Nos  (ICD-995.20) 14)  Coronary Artery Disease  (ICD-414.00) 15)  Diverticulosis, Colon  (ICD-562.10) 16)  Barrett's Esophagus, Hx of  (ICD-V12.79) 17)  Hypertension  (ICD-401.9)  Current Problems (verified): 1)  Acute On Chronic Systolic Heart Failure  (ICD-428.23) 2)  Hx of Breast Mass, Benign  (ICD-611.72) 3)  Edema  (ICD-782.3) 4)  Epistaxis, Recurrent  (ICD-784.7) 5)  Colorectal Cancer, Family Hx  (ICD-V16.0) 6)  Fracture, Right Hand  (ICD-815.00) 7)   Fracture, Hand  (ICD-815.00) 8)  Hyperlipidemia  (ICD-272.4) 9)  Diarrhea  (ICD-787.91) 10)  Esophageal Reflux  (ICD-530.81) 11)  Hypothyroidism  (ICD-244.9) 12)  Disorder, Bladder Nec  (ICD-596.8) 13)  Advef, Drug/medicinal/biological Subst Nos  (ICD-995.20) 14)  Coronary Artery Disease  (ICD-414.00) 15)  Diverticulosis, Colon  (ICD-562.10) 16)  Barrett's Esophagus, Hx of  (ICD-V12.79) 17)  Hypertension  (ICD-401.9)  Medications Prior to Update: 1)  Ditropan Xl 10 Mg  Tb24 (Oxybutynin Chloride) .... Once Daily 2)  Aspirin Ec 81 Mg Tbec (Aspirin) .... Take 1 Tablet By Mouth Once A Day 3)  Isosorbide Mononitrate Cr 60 Mg Xr24h-Tab (Isosorbide Mononitrate) .[name].. 1 Two Times A Day Pt Request [place] Tabs 4)  Klor-Con 10 10 Meq Tbcr (Potassium Chloride) .... Take 1 Tablet By Mouth Once A Day 5)  Nabumetone 500 Mg  Tabs (Nabumetone) .[name].. 1 Two Times A Day Pc Prn 6)  Synthroid 50 Mcg Tabs (Levothyroxine Sodium) .... Name Brand Only 1 Once Daily 7)  Furosemide 40 Mg Tabs (Furosemide) .... One By Mouth Daily 8)  Bystolic 5 Mg Tabs (Nebivolol Hcl) .... One By Mouth Daly 9)  Multivitamins   Tabs (Multiple Vitamin) .... Once Daily 10)  Calcium 600/vitamin D 600-200 Mg-Unit  Tabs (Calcium Carbonate-Vitamin D) .... Two Times A Day 11)  Elmiron 100 Mg  Caps (Pentosan Polysulfate Sodium) .... One Tid  12)  Lansoprazole 30 Mg Tbdp (Lansoprazole) .... One By Mouth Two Times A Day 13)  Exforge 5-320 Mg Tabs (Amlodipine Besylate-Valsartan) .... One By Mouth Daily 14)  Simvastatin 40 Mg Tabs (Simvastatin) .[name].. 1 By Mouth Daily 15)  Tramadol Hcl 50 Mg Tabs (Tramadol Hcl) .... One By Mouth Q 4 Hour As Needed Pain 16)  Meclizine Hcl 25 Mg Tabs (Meclizine Hcl) .... One By Mouth Tid Prn 17)  Nitrostat 0.4 Mg Subl (Nitroglycerin) .[name].. 1 Sublingual Prn Chest Paini  Current Medications (verified): 1)  Ditropan Xl 10 Mg  Tb24 (Oxybutynin Chloride) .... Once Daily 2)  Aspirin Ec 81 Mg Tbec (Aspirin) .... Take 1  Tablet By Mouth Once A Day 3)  Isosorbide Mononitrate Cr 60 Mg Xr24h-Tab (Isosorbide Mononitrate) .[name].. 1 Two Times A Day Pt Request [place] Tabs 4)  Klor-Con 10 10 Meq Tbcr (Potassium Chloride) .... Take 1 Tablet By Mouth Once A Day 5)  Nabumetone 500 Mg  Tabs (Nabumetone) .[name].. 1 Two Times A Day Pc Prn 6)  Synthroid 50 Mcg Tabs (Levothyroxine Sodium) .... Name Brand Only 1 Once Daily 7)  Furosemide 40 Mg Tabs (Furosemide) .... One By Mouth Daily 8)  Bystolic 5 Mg Tabs (Nebivolol Hcl) .... One By Mouth Daly 9)  Multivitamins   Tabs (Multiple Vitamin) .... Once Daily 10)  Calcium 600/vitamin D 600-200 Mg-Unit  Tabs (Calcium Carbonate-Vitamin D) .... Two Times A Day 11)  Elmiron 100 Mg  Caps (Pentosan Polysulfate Sodium) .... One Tid 12)  Lansoprazole 30 Mg Tbdp (Lansoprazole) .... One By Mouth Two Times A Day 13)  Exforge 5-320 Mg Tabs (Amlodipine Besylate-Valsartan) .... One By Mouth Daily 14)  Crestor 20 Mg Tabs (Rosuvastatin Calcium) .... One By Mouth Daily 15)  Tramadol Hcl 50 Mg Tabs (Tramadol Hcl) .... One By Mouth Q 4 Hour As Needed Pain 16)  Meclizine Hcl 25 Mg Tabs (Meclizine Hcl) .... One By Mouth Tid Prn 17)  Nitrostat 0.4 Mg Subl (Nitroglycerin) .[name].. 1 Sublingual Prn Chest Paini  Allergies (verified): 1)  ! Codeine 2)  ! Sulfa  Past History:  Family History: Last updated: 11/16/2008 Family History Hypertension Family History of Colon Cancer: Mother Family History of Colon Polyps: Sister Family History of Diabetes: Son Family History of Heart Disease: Mother, Father, Brother x2, Sister  Social History: Last updated: 11/16/2008 Retired Married Alcohol use-no Patient is a former smoker. -stopped 40 years ago Illicit Drug Use - no Patient does not get regular exercise.   Risk Factors: Exercise: no (11/16/2008)  Risk Factors: Smoking Status: quit (05/31/2010) Passive Smoke Exposure: no (05/31/2010)  Past medical, surgical, family and social histories (including  risk factors) reviewed, and no changes noted (except as noted below).  Past Medical History: Reviewed history from 11/16/2008 and no changes required. GERD Hypertension Diverticulosis Internal hemorrhoids Coronary artery disease Hypothyroidism Hyperlipidemia Arthritis Interstitial Cystitis Pneumonia  Past Surgical History: Reviewed history from 11/16/2008 and no changes required. stent 06/08/00 Hysterectomy Cholecystectomy C-section Appendectomy Cataract Extraction-bilaterally Tonsillectomy  Family History: Reviewed history from 11/16/2008 and no changes required. Family History Hypertension Family History of Colon Cancer: Mother Family History of Colon Polyps: Sister Family History of Diabetes: Son Family History of Heart Disease: Mother, Father, Brother x2, Sister  Social History: Reviewed history from 11/16/2008 and no changes required. Retired Married Alcohol use-no Patient is a former smoker. -stopped 40 years ago Illicit Drug Use - no Patient does not get regular exercise.   Review of Systems       The  patient complains of chest pain and peripheral edema.  The patient denies anorexia, fever, weight loss, weight gain, vision loss, decreased hearing, hoarseness, syncope, dyspnea on exertion, prolonged cough, headaches, hemoptysis, abdominal pain, melena, hematochezia, severe indigestion/heartburn, hematuria, incontinence, genital sores, muscle weakness, suspicious skin lesions, transient blindness, difficulty walking, and depression.    Physical Exam  General:  Well developed, well nourished, no acute distress. Head:  Normocephalic and atraumatic. Eyes:  PERRLA, no icterus. Ears:  Normal auditory acuity. Nose:  no external deformity and no nasal discharge.   Neck:  Supple; no masses or thyromegaly. Lungs:  normal respiratory effort and no crackles.   Heart:  normal rate and bradycardia.   Abdomen:  Soft, nontender and nondistended. No masses,  hepatosplenomegaly or hernias noted. Normal bowel sounds. Msk:  decreased ROM and joint tenderness.   Extremities:  trace left pedal edema and trace right pedal edema.   Neurologic:  alert & oriented X3 and finger-to-nose normal.     Impression & Recommendations:  Problem # 1:  HYPERTENSION (ICD-401.9) Assessment Unchanged  Her updated medication list for this problem includes:    Furosemide 40 Mg Tabs (Furosemide) ..... One by mouth daily    Bystolic 5 Mg Tabs (Nebivolol hcl) ..... One by mouth daly    Exforge 5-320 Mg Tabs (Amlodipine besylate-valsartan) ..... One by mouth daily  BP today: 122/74 Prior BP: 130/72 (02/19/2010)  Prior 10 Yr Risk Heart Disease: N/A (08/03/2007)  Labs Reviewed: K+: 3.6 (02/19/2010) Creat: : 0.9 (02/19/2010)   Chol: 140 (02/19/2010)   HDL: 55.40 (02/19/2010)   LDL: 70 (09/17/2009)   TG: 99.0 (09/17/2009)  Problem # 2:  HYPERLIPIDEMIA (ICD-272.4) Assessment: Unchanged discussed use and side effects and gave samples Her updated medication list for this problem includes:    Crestor 20 Mg Tabs (Rosuvastatin calcium) ..... One by mouth daily  Labs Reviewed: SGOT: 26 (09/17/2009)   SGPT: 19 (09/17/2009)  Prior 10 Yr Risk Heart Disease: N/A (08/03/2007)   HDL:55.40 (02/19/2010), 53.40 (09/17/2009)  LDL:70 (09/17/2009)  Chol:140 (02/19/2010), 143 (09/17/2009)  Trig:99.0 (09/17/2009)  Problem # 3:  CORONARY ARTERY DISEASE (ICD-414.00) mild agina that has a stable pattern  Her updated medication list for this problem includes:    Aspirin Ec 81 Mg Tbec (Aspirin) .[name]... Take 1 tablet by mouth once a day    Isosorbide Mononitrate Cr 60 Mg Xr24h-tab (Isosorbide mononitrate) .[name]... 1 two times a day pt request [place] tabs    Furosemide 40 Mg Tabs (Furosemide) ..... One by mouth daily    Bystolic 5 Mg Tabs (Nebivolol hcl) ..... One by mouth daly    Exforge 5-320 Mg Tabs (Amlodipine besylate-valsartan) ..... One by mouth daily    Nitrostat 0.4 Mg Subl  (Nitroglycerin) .[name]... 1 sublingual prn chest paini  Labs Reviewed: Chol: 140 (02/19/2010)   HDL: 55.40 (02/19/2010)   LDL: 70 (09/17/2009)   TG: 99.0 (09/17/2009)  Problem # 4:  ACUTE ON CHRONIC SYSTOLIC HEART FAILURE (ICD-428.23) stable Her updated medication list for this problem includes:    Aspirin Ec 81 Mg Tbec (Aspirin) .[name]... Take 1 tablet by mouth once a day    Furosemide 40 Mg Tabs (Furosemide) ..... One by mouth daily    Bystolic 5 Mg Tabs (Nebivolol hcl) ..... One by mouth daly    Exforge 5-320 Mg Tabs (Amlodipine besylate-valsartan) ..... One by mouth daily  Complete Medication List: 1)  Ditropan Xl 10 Mg Tb24 (Oxybutynin chloride) .... Once daily 2)  Aspirin Ec 81 Mg Tbec (Aspirin) .Marland KitchenMarland KitchenMarland Kitchen  Take 1 tablet by mouth once a day 3)  Isosorbide Mononitrate Cr 60 Mg Xr24h-tab (Isosorbide mononitrate) .[name].. 1 two times a day pt request [place] tabs 4)  Klor-con 10 10 Meq Tbcr (Potassium chloride) .... Take 1 tablet by mouth once a day 5)  Nabumetone 500 Mg Tabs (Nabumetone) .[name].. 1 two times a day pc prn 6)  Synthroid 50 Mcg Tabs (Levothyroxine sodium) .... Name brand only 1 once daily 7)  Furosemide 40 Mg Tabs (Furosemide) .... One by mouth daily 8)  Bystolic 5 Mg Tabs (Nebivolol hcl) .... One by mouth daly 9)  Multivitamins Tabs (Multiple vitamin) .... Once daily 10)  Calcium 600/vitamin D 600-200 Mg-unit Tabs (Calcium carbonate-vitamin d) .... Two times a day 11)  Elmiron 100 Mg Caps (Pentosan polysulfate sodium) .... One tid 12)  Lansoprazole 30 Mg Tbdp (Lansoprazole) .... One by mouth two times a day 13)  Exforge 5-320 Mg Tabs (Amlodipine besylate-valsartan) .... One by mouth daily 14)  Crestor 20 Mg Tabs (Rosuvastatin calcium) .... One by mouth daily 15)  Tramadol Hcl 50 Mg Tabs (Tramadol hcl) .... One by mouth q 4 hour as needed pain 16)  Meclizine Hcl 25 Mg Tabs (Meclizine hcl) .... One by mouth tid prn 17)  Nitrostat 0.4 Mg Subl (Nitroglycerin) .[name].. 1 sublingual prn chest  paini  Patient Instructions: 1)  Please schedule a follow-up appointment in 3 months. 2)  BMP prior to visit, ICD-9:995.20 3)  Hepatic Panel prior to visit, ICD-9:995.20 4)  Lipid Panel prior to visit, ICD-9:272.4 5)  TSH prior to visit, ICD-9:272.4 Prescriptions: EXFORGE 5-320 MG TABS (AMLODIPINE BESYLATE-VALSARTAN) one by mouth daily  #90 x 3   Entered and Authorized by:   Stacie Glaze MD   Signed by:   Stacie Glaze MD on 05/31/2010   Method used:   Print then Give to Patient   RxID:   [id number] KLOR-CON 10 10 MEQ TBCR (POTASSIUM CHLORIDE) Take 1 tablet by mouth once a day  #90 x 3   Entered and Authorized by:   Stacie Glaze MD   Signed by:   Stacie Glaze MD on 05/31/2010   Method used:   Print then Give to Patient   RxID:   [id number]

## 2010-12-10 NOTE — Progress Notes (Signed)
Summary: safety concern meds  Phone Note From Pharmacy   Caller: Vickey Sages, [phone] option 2 Summary of Call: Rx safety concern simvastatin 40mg  & exforge which has amlodipine.  FDA guidelines no more than 20mg  with amlodipine because of rhabdomylosis.  Ref #[id number] Initial call taken by: Rudy Jew, RN,  May 08, 2010 4:24 PM  Follow-up for Phone Call        cut simvastatin to 20 ( break pills in 1/2) Follow-up by: Stacie Glaze MD,  May 09, 2010 8:52 AM  Additional Follow-up for Phone Call Additional follow up Details #1::        [facility] notified. Additional Follow-up by: Lynann Beaver CMA,  May 09, 2010 9:06 AM

## 2010-12-10 NOTE — Assessment & Plan Note (Signed)
Summary: 3 month rov/njr   Vital Signs:  Patient profile:   77 year old female Weight:      162 pounds BMI:     24.72 Temp:     98.4 degrees F oral Pulse rate:   70 / minute Resp:     16 per minute BP sitting:   156 / 72  Vitals Entered By: Lynann Beaver CMA AAMA (September 11, 2010 1:16 PM) CC: chest congestion and URI, Hypertension Management Is Patient Diabetic? No Pain Assessment Patient in pain? no        Primary Care Provider:  Darryll Capers, MD  CC:  chest congestion and URI and Hypertension Management.  History of Present Illness: Two weeks of cough and congestion. Was seen in urgent care and given a "zpack" and the symptoms have continued. Fever, chills in the afternoon and night. Cough is productive of thick yellow sputun=m and she complains of left maxilary face pain   follow up the lipids and the renal panel she cannot take the crestor.... gives the myalgias  Hypertension History:      She denies headache, chest pain, palpitations, dyspnea with exertion, orthopnea, PND, peripheral edema, visual symptoms, neurologic problems, syncope, and side effects from treatment.  elevated blood pressure with the pain form the sinuses.        Positive major cardiovascular risk factors include female age 27 years old or older, hyperlipidemia, and hypertension.  Negative major cardiovascular risk factors include negative family history for ischemic heart disease and non-tobacco-user status.        Positive history for target organ damage include ASHD (either angina/prior MI/prior CABG) and cardiac end organ damage (either CHF or LVH).     Preventive Screening-Counseling & Management  Alcohol-Tobacco     Smoking Status: never     Tobacco Counseling: not indicated; no tobacco use  Problems Prior to Update: 1)  Acute On Chronic Systolic Heart Failure  (ICD-428.23) 2)  Hx of Breast Mass, Benign  (ICD-611.72) 3)  Edema  (ICD-782.3) 4)  Epistaxis, Recurrent  (ICD-784.7) 5)   Colorectal Cancer, Family Hx  (ICD-V16.0) 6)  Fracture, Right Hand  (ICD-815.00) 7)  Fracture, Hand  (ICD-815.00) 8)  Hyperlipidemia  (ICD-272.4) 9)  Diarrhea  (ICD-787.91) 10)  Esophageal Reflux  (ICD-530.81) 11)  Hypothyroidism  (ICD-244.9) 12)  Disorder, Bladder Nec  (ICD-596.8) 13)  Advef, Drug/medicinal/biological Subst Nos  (ICD-995.20) 14)  Coronary Artery Disease  (ICD-414.00) 15)  Diverticulosis, Colon  (ICD-562.10) 16)  Barrett's Esophagus, Hx of  (ICD-V12.79) 17)  Hypertension  (ICD-401.9)  Medications Prior to Update: 1)  Ditropan Xl 10 Mg  Tb24 (Oxybutynin Chloride) .... Once Daily 2)  Aspirin Ec 81 Mg Tbec (Aspirin) .... Take 1 Tablet By Mouth Once A Day 3)  Isosorbide Mononitrate Cr 60 Mg Xr24h-Tab (Isosorbide Mononitrate) .[name].. 1 Two Times A Day Pt Request West-Wards Tabs 4)  Klor-Con 10 10 Meq Tbcr (Potassium Chloride) .... Take 1 Tablet By Mouth Once A Day 5)  Nabumetone 500 Mg  Tabs (Nabumetone) .[name].. 1 Two Times A Day Pc Prn 6)  Synthroid 50 Mcg Tabs (Levothyroxine Sodium) .... Name Brand Only 1 Once Daily 7)  Furosemide 40 Mg Tabs (Furosemide) .... One By Mouth Daily 8)  Bystolic 5 Mg Tabs (Nebivolol Hcl) .... One By Mouth Daly 9)  Multivitamins   Tabs (Multiple Vitamin) .... Once Daily 10)  Calcium 600/vitamin D 600-200 Mg-Unit  Tabs (Calcium Carbonate-Vitamin D) .... Two Times A Day 11)  Elmiron 100 Mg  Caps (Pentosan Polysulfate Sodium) .... One Tid 12)  Lansoprazole 30 Mg Tbdp (Lansoprazole) .... One By Mouth Two Times A Day 13)  Exforge 5-320 Mg Tabs (Amlodipine Besylate-Valsartan) .... One By Mouth Daily 14)  Crestor 20 Mg Tabs (Rosuvastatin Calcium) .... One By Mouth Daily 15)  Tramadol Hcl 50 Mg Tabs (Tramadol Hcl) .... One By Mouth Q 4 Hour As Needed Pain 16)  Meclizine Hcl 25 Mg Tabs (Meclizine Hcl) .... One By Mouth Tid Prn 17)  Nitrostat 0.4 Mg Subl (Nitroglycerin) .[name].. 1 Sublingual Prn Chest Paini  Current Medications (verified): 1)  Aspirin Ec 81 Mg  Tbec (Aspirin) .... Take 1 Tablet By Mouth Once A Day 2)  Isosorbide Mononitrate Cr 60 Mg Xr24h-Tab (Isosorbide Mononitrate) .[name].. 1 Two Times A Day Pt Request West-Wards Tabs 3)  Klor-Con 10 10 Meq Tbcr (Potassium Chloride) .... Take 1 Tablet By Mouth Once A Day 4)  Synthroid 50 Mcg Tabs (Levothyroxine Sodium) .... Name Brand Only 1 Once Daily 5)  Furosemide 40 Mg Tabs (Furosemide) .... One By Mouth Daily 6)  Bystolic 5 Mg Tabs (Nebivolol Hcl) .... One By Mouth Daly 7)  Multivitamins   Tabs (Multiple Vitamin) .... Once Daily 8)  Calcium 600/vitamin D 600-200 Mg-Unit  Tabs (Calcium Carbonate-Vitamin D) .... Two Times A Day 9)  Elmiron 100 Mg  Caps (Pentosan Polysulfate Sodium) .... One Tid 10)  Lansoprazole 30 Mg Tbdp (Lansoprazole) .... One By Mouth Two Times A Day 11)  Exforge 5-320 Mg Tabs (Amlodipine Besylate-Valsartan) .... One By Mouth Daily 12)  Pravastatin Sodium 40 Mg Tabs (Pravastatin Sodium) .... One By Mouth Daily 13)  Nitrostat 0.4 Mg Subl (Nitroglycerin) .[name].. 1 Sublingual Prn Chest Paini 14)  Bromatan Plus 45-3.5-30 Mg/87ml Susp (Pse Tan-Dexchlor Tan-Dm Tan) .... Two Tsp By Mouth Two Times A Day  Allergies (verified): 1)  ! Codeine 2)  ! Sulfa  Past History:  Family History: Last updated: 11/16/2008 Family History Hypertension Family History of Colon Cancer: Mother Family History of Colon Polyps: Sister Family History of Diabetes: Son Family History of Heart Disease: Mother, Father, Brother x2, Sister  Social History: Last updated: 11/16/2008 Retired Married Alcohol use-no Patient is a former smoker. -stopped 40 years ago Illicit Drug Use - no Patient does not get regular exercise.   Risk Factors: Exercise: no (11/16/2008)  Risk Factors: Smoking Status: never (09/11/2010) Passive Smoke Exposure: no (05/31/2010)  Past medical, surgical, family and social histories (including risk factors) reviewed, and no changes noted (except as noted below).  Past Medical  History: Reviewed history from 11/16/2008 and no changes required. GERD Hypertension Diverticulosis Internal hemorrhoids Coronary artery disease Hypothyroidism Hyperlipidemia Arthritis Interstitial Cystitis Pneumonia  Past Surgical History: Reviewed history from 11/16/2008 and no changes required. stent 06/08/00 Hysterectomy Cholecystectomy C-section Appendectomy Cataract Extraction-bilaterally Tonsillectomy  Family History: Reviewed history from 11/16/2008 and no changes required. Family History Hypertension Family History of Colon Cancer: Mother Family History of Colon Polyps: Sister Family History of Diabetes: Son Family History of Heart Disease: Mother, Father, Brother x2, Sister  Social History: Reviewed history from 11/16/2008 and no changes required. Retired Married Alcohol use-no Patient is a former smoker. -stopped 40 years ago Illicit Drug Use - no Patient does not get regular exercise.  Smoking Status:  never  Review of Systems  The patient denies anorexia, fever, weight loss, weight gain, vision loss, decreased hearing, hoarseness, chest pain, syncope, dyspnea on exertion, peripheral edema, prolonged cough, headaches, hemoptysis, abdominal pain, melena, hematochezia, severe indigestion/heartburn, hematuria, incontinence, genital sores,  muscle weakness, suspicious skin lesions, transient blindness, difficulty walking, depression, unusual weight change, abnormal bleeding, enlarged lymph nodes, angioedema, and breast masses.    Physical Exam  General:  Well developed, well nourished, no acute distress. Head:  Normocephalic and atraumatic. Eyes:  PERRLA, no icterus. Nose:  mucosal edema and airflow obstruction.   Mouth:  posterior lymphoid hypertrophy and postnasal drip.   Neck:  Supple; no masses or thyromegaly. Lungs:  normal respiratory effort and no crackles.   Heart:  normal rate and bradycardia.   Abdomen:  Soft, nontender and nondistended. No  masses, hepatosplenomegaly or hernias noted. Normal bowel sounds.   Impression & Recommendations:  Problem # 1:  ACUTE BRONCHITIS (ICD-466.0)  Her updated medication list for this problem includes:    Bromatan Plus 45-3.5-30 Mg/78ml Susp (Pse tan-dexchlor tan-dm tan) .[name]..[name] Two tsp by mouth two times a day  Take antibiotics and other medications as directed. Encouraged to push clear liquids, get enough rest, and take acetaminophen as needed. To be seen in 5-7 days if no improvement, sooner if worse.  Problem # 2:  CORONARY ARTERY DISEASE (ICD-414.00) no chest pain Her updated medication list for this problem includes:    Aspirin Ec 81 Mg Tbec (Aspirin) .[name]... Take 1 tablet by mouth once a day    Isosorbide Mononitrate Cr 60 Mg Xr24h-tab (Isosorbide mononitrate) .[name]... 1 two times a day pt request west-wards tabs    Furosemide 40 Mg Tabs (Furosemide) ..... One by mouth daily    Bystolic 5 Mg Tabs (Nebivolol hcl) ..... One by mouth daly    Exforge 5-320 Mg Tabs (Amlodipine besylate-valsartan) ..... One by mouth daily    Nitrostat 0.4 Mg Subl (Nitroglycerin) .[name]... 1 sublingual prn chest paini  Problem # 3:  HYPERTENSION (ICD-401.9) Assessment: Deteriorated due to use of deconjestants Her updated medication list for this problem includes:    Furosemide 40 Mg Tabs (Furosemide) ..... One by mouth daily    Bystolic 5 Mg Tabs (Nebivolol hcl) ..... One by mouth daly    Exforge 5-320 Mg Tabs (Amlodipine besylate-valsartan) ..... One by mouth daily  BP today: 156/72 Prior BP: 122/74 (05/31/2010)  Prior 10 Yr Risk Heart Disease: N/A (08/03/2007)  Labs Reviewed: K+: 4.2 (09/04/2010) Creat: : 1.0 (09/04/2010)   Chol: 133 (09/04/2010)   HDL: 40.90 (09/04/2010)   LDL: 73 (09/04/2010)   TG: 98.0 (09/04/2010)  Complete Medication List: 1)  Aspirin Ec 81 Mg Tbec (Aspirin) .... Take 1 tablet by mouth once a day 2)  Isosorbide Mononitrate Cr 60 Mg Xr24h-tab (Isosorbide mononitrate) .[name].. 1 two times a  day pt request west-wards tabs 3)  Klor-con 10 10 Meq Tbcr (Potassium chloride) .... Take 1 tablet by mouth once a day 4)  Synthroid 50 Mcg Tabs (Levothyroxine sodium) .... Name brand only 1 once daily 5)  Furosemide 40 Mg Tabs (Furosemide) .... One by mouth daily 6)  Bystolic 5 Mg Tabs (Nebivolol hcl) .... One by mouth daly 7)  Multivitamins Tabs (Multiple vitamin) .... Once daily 8)  Calcium 600/vitamin D 600-200 Mg-unit Tabs (Calcium carbonate-vitamin d) .... Two times a day 9)  Elmiron 100 Mg Caps (Pentosan polysulfate sodium) .... One tid 10)  Lansoprazole 30 Mg Tbdp (Lansoprazole) .... One by mouth two times a day 11)  Exforge 5-320 Mg Tabs (Amlodipine besylate-valsartan) .... One by mouth daily 12)  Pravastatin Sodium 40 Mg Tabs (Pravastatin sodium) .... One by mouth daily 13)  Nitrostat 0.4 Mg Subl (Nitroglycerin) .[name].. 1 sublingual prn chest paini 14)  Bromatan Plus 45-3.5-30 Mg/62ml Susp (Pse tan-dexchlor tan-dm tan) .... Two tsp by mouth two times a day  Hypertension Assessment/Plan:      The patient's hypertensive risk group is category C: Target organ damage and/or diabetes.  Today's blood pressure is 156/72.  Her blood pressure goal is < 140/90.  Patient Instructions: 1)  Please schedule a follow-up appointment in 3 months. Prescriptions: BROMATAN PLUS 45-3.5-30 MG/5ML SUSP (PSE TAN-DEXCHLOR TAN-DM TAN) two tsp by mouth two times a day  #6 oz x 1   Entered and Authorized by:   Stacie Glaze MD   Signed by:   Stacie Glaze MD on 09/11/2010   Method used:   [facility] to        [facility]  [address] [id number]       [address]       [place], [place]  [address]       Ph: [phone] or [phone]       Fax: [phone]   RxID:   [id number] AVELOX 400 MG TABS (MOXIFLOXACIN HCL) one by mouth daily for 7 days  #7 x 0   Entered and Authorized by:   Stacie Glaze MD   Signed by:   Stacie Glaze MD on 09/11/2010   Method used:    [facility]        [facility]  [address] [id number]       [address]       [place], [place]  [address]       Ph: [phone] or [phone]       Fax: [phone]   RxID:   [id number] ISOSORBIDE MONONITRATE CR 60 MG XR24H-TAB (ISOSORBIDE MONONITRATE) 1 two times a day pt request west-wards tabs  #180 x 3   Entered and Authorized by:   Stacie Glaze MD   Signed by:   Stacie Glaze MD on 09/11/2010   Method used:   Faxed to ...       [facility] (mail-order)             , [place]         Ph: [phone]       Fax: [phone]   RxID:   [id number] LANSOPRAZOLE 30 MG TBDP (LANSOPRAZOLE) one by mouth two times a day  #180 x 3   Entered and Authorized by:   Stacie Glaze MD   Signed by:   Stacie Glaze MD on 09/11/2010   Method used:   Faxed to ...       [facility] (mail-order)             , [place]         Ph: [phone]       Fax: [phone]   RxID:   [id number] PRAVASTATIN SODIUM 40 MG TABS (PRAVASTATIN SODIUM) one by mouth daily  #90 x 3   Entered and Authorized by:   Stacie Glaze MD   Signed by:   Stacie Glaze MD on 09/11/2010   Method used:   Faxed to ...       [facility] (mail-order)             , [place]         Ph: [phone]       Fax: [phone]   RxID:   [id number]    Orders Added: 1)  Est. Patient  Level IV [id number]    Prevention & Chronic Care Immunizations   Influenza vaccine: Fluvax 3+  (08/16/2008)    Tetanus booster: Not documented    Pneumococcal vaccine: Pneumovax (Medicare)  (09/09/2007)    H. zoster vaccine: Not documented  Colorectal Screening   Hemoccult: Not documented    Colonoscopy: Results: Hemorrhoids.     Results: Diverticulosis.       Location:  [facility].    (10/03/2003)   Colonoscopy due: 10/2008  Other Screening   Pap smear: Not documented    Mammogram: ASSESSMENT: Negative - BI-RADS 1^MM DIGITAL SCREENING  (04/16/2010)    DXA bone density scan: Not  documented   Smoking status: never  (09/11/2010)  Lipids   Total Cholesterol: 133  (09/04/2010)   LDL: 73  (09/04/2010)   LDL Direct: 81.0  (02/19/2010)   HDL: 40.90  (09/04/2010)   Triglycerides: 98.0  (09/04/2010)    SGOT (AST): 18  (09/04/2010)   SGPT (ALT): 13  (09/04/2010)   Alkaline phosphatase: 76  (09/04/2010)   Total bilirubin: 0.5  (09/04/2010)  Hypertension   Last Blood Pressure: 156 / 72  (09/11/2010)   Serum creatinine: 1.0  (09/04/2010)   Serum potassium 4.2  (09/04/2010)  Self-Management Support :    Hypertension self-management support: Not documented    Lipid self-management support: Not documented

## 2010-12-10 NOTE — Op Note (Signed)
Summary: Lumbar epidural steroid injection/Dr. Sheran Luz  Lumbar epidural steroid injection/Dr. Sheran Luz   Imported By: Maryln Gottron 07/03/2010 13:27:02  _____________________________________________________________________  External Attachment:    Type:   Image     Comment:   External Document

## 2010-12-10 NOTE — Progress Notes (Signed)
Summary: sinus  Phone Note From Other Clinic   Caller: Patient Call For: Stacie Glaze MD Summary of Call: Pt is calling askng for RX for URI with sinus headache and pressure and fever of 100 x several days.  [facility] [address] [phone] Initial call taken by: Lynann Beaver CMA,  February 06, 2010 3:54 PM  Follow-up for Phone Call        per dr Lovell Sheehan- may have z pack and zyrtec d once daily  Follow-up by: Willy Eddy, LPN,  February 06, 2010 4:16 PM    New/Updated Medications: ZITHROMAX Z-PAK 250 MG TABS (AZITHROMYCIN) as directed ZYRTEC-D ALLERGY & CONGESTION 5-120 MG XR12H-TAB (CETIRIZINE-PSEUDOEPHEDRINE) one by mouth daily Prescriptions: ZYRTEC-D ALLERGY & CONGESTION 5-120 MG XR12H-TAB (CETIRIZINE-PSEUDOEPHEDRINE) one by mouth daily  #30 x 0   Entered by:   Lynann Beaver CMA   Authorized by:   Stacie Glaze MD   Signed by:   Lynann Beaver CMA on 02/06/2010   Method used:   [facility]        [facility]  [address] [id number]       [address]       [place], [place]  [address]       Ph: [phone] or [phone]       Fax: [phone]   RxID:   [id number] ZITHROMAX Z-PAK 250 MG TABS (AZITHROMYCIN) as directed  #6 x 0   Entered by:   Lynann Beaver CMA   Authorized by:   Stacie Glaze MD   Signed by:   Lynann Beaver CMA on 02/06/2010   Method used:   [facility]        [facility]  [id number]       [address]       [place], [place]  [address]       Ph: [phone] or [phone]       Fax: [phone]   RxID:   [id number]  Pt. notified.

## 2010-12-11 ENCOUNTER — Encounter: Payer: Self-pay | Admitting: Internal Medicine

## 2010-12-11 ENCOUNTER — Ambulatory Visit: Admit: 2010-12-11 | Payer: Self-pay | Admitting: Internal Medicine

## 2010-12-11 ENCOUNTER — Ambulatory Visit (INDEPENDENT_AMBULATORY_CARE_PROVIDER_SITE_OTHER): Payer: Medicare Other | Admitting: Internal Medicine

## 2010-12-11 DIAGNOSIS — I10 Essential (primary) hypertension: Secondary | ICD-10-CM

## 2010-12-11 DIAGNOSIS — IMO0002 Reserved for concepts with insufficient information to code with codable children: Secondary | ICD-10-CM

## 2010-12-11 DIAGNOSIS — E039 Hypothyroidism, unspecified: Secondary | ICD-10-CM

## 2010-12-11 DIAGNOSIS — E785 Hyperlipidemia, unspecified: Secondary | ICD-10-CM

## 2010-12-11 DIAGNOSIS — I2510 Atherosclerotic heart disease of native coronary artery without angina pectoris: Secondary | ICD-10-CM

## 2010-12-11 DIAGNOSIS — M5416 Radiculopathy, lumbar region: Secondary | ICD-10-CM

## 2010-12-11 DIAGNOSIS — N3010 Interstitial cystitis (chronic) without hematuria: Secondary | ICD-10-CM | POA: Insufficient documentation

## 2010-12-11 LAB — BASIC METABOLIC PANEL
Calcium: 9.6 mg/dL (ref 8.4–10.5)
Chloride: 108 mEq/L (ref 96–112)
Creatinine, Ser: 1 mg/dL (ref 0.4–1.2)
GFR: 58.85 mL/min — ABNORMAL LOW (ref >60.00)
Glucose, Bld: 88 mg/dL (ref 70–99)
Potassium: 4.1 mEq/L (ref 3.5–5.1)
Sodium: 143 mEq/L (ref 135–145)

## 2010-12-11 LAB — TSH: TSH: 0.74 u[IU]/mL (ref 0.35–5.50)

## 2010-12-11 LAB — CONVERTED CEMR LAB: T4, Total: 11 ug/dL (ref 5.0–12.5)

## 2010-12-11 LAB — T3, FREE: T3, Free: 2.5 pg/mL (ref 2.3–4.2)

## 2010-12-11 LAB — T4: T4, Total: 11 ug/dL (ref 5.0–12.5)

## 2010-12-11 LAB — LDL CHOLESTEROL, DIRECT: Direct LDL: 79.7 mg/dL

## 2010-12-11 MED ORDER — OMEGA-3 FATTY ACIDS 1000 MG PO CAPS: 2.0000 g | ORAL_CAPSULE | Freq: Every day | ORAL | Status: DC

## 2010-12-11 NOTE — Assessment & Plan Note (Addendum)
A lipid panel will be ordered today to monitor success of current therapy review of prior therapy reveals that she neared goal however recent guidelines have suggested that LDL cholesterol be 70 or less in persons with coronary artery disease in her is is 90. She has been intolerant of other statins rather than pravastatin. Therefore we will add visual 1000 mg capsules twice daily.

## 2010-12-11 NOTE — Progress Notes (Deleted)
  Subjective:    Patient ID: Victoria Gomez, female    DOB: June 22, 1925, 77 y.o.   MRN: [id number]  HPI    Review of Systems     Objective:   Physical Exam        Assessment & Plan:

## 2010-12-11 NOTE — Progress Notes (Signed)
  Subjective:    Patient ID: Victoria Gomez, female    DOB: 1925/01/20, 77 y.o.   MRN: [id number]  HPI [name] an 77 year old white female who presents for followup of multiple chronic medical problems including hypertension hyperlipidemia history of coronary artery disease and history of interstitial cystitis.  She also has a history of lumbar radiculopathy with an L4-L5 spondylosis for which she has received prior injections by Dr. Modesta Messing and is planning on calling Dr. Ethelene Hal for another injection soon.  Her CAD has been somewhat unstable in episode of chest pain several weeks ago that required 2 nitroglycerin to alleviate the pain. There was still some residual pain even after the 2 nitroglycerin. A stent placed in the past by Dr. Dickie La but it's been a while since she had followup with cardiology. It has been greater than a year since she had any sort of cardiac imaging. Her blood pressure and vital signs appear stable. Her esophageal reflux with a history of Barrett's esophagus appear stable however her chest pain is sometimes fused with esophageal reflux she is due monitoring of her thyroid     Review of Systems  Constitutional: Positive for fatigue.  HENT: Positive for rhinorrhea.   Eyes: Negative.   Respiratory: Positive for cough.   Cardiovascular: Positive for chest pain.  Gastrointestinal: Negative.   Musculoskeletal: Negative for myalgias, arthralgias and gait problem.  Skin: Positive for rash.       Noted a red rash across the top of her chest and the top of her stomach last week that occurred along with a sore throat. This rash has gone away quickly  Neurological: Positive for weakness. Negative for syncope, light-headedness and headaches.  Psychiatric/Behavioral: Negative for confusion.       Objective:   Physical Exam  Constitutional: She is oriented to person, place, and time. She appears well-developed and well-nourished.  HENT:  Head: Normocephalic and atraumatic.    Cardiovascular: Normal rate and regular rhythm.   Murmur heard. Pulmonary/Chest: Breath sounds normal.  Abdominal: Soft. Bowel sounds are normal.  Neurological: She is alert and oriented to person, place, and time. She has normal reflexes.  Skin: Skin is warm and dry. No rash noted.  Psychiatric: She has a normal mood and affect. Her speech is normal and behavior is normal. Judgment and thought content normal. Cognition and memory are normal.          Assessment & Plan:

## 2010-12-11 NOTE — Assessment & Plan Note (Signed)
She is having episodes of chest pain that required sublingual nitroglycerin. She does have Barrett's esophagus and certainly esophageal pain might be similar to coronary pain but it is worrisome that these episodes are occurring with the severity that she reports. It has been over a year since she had any sort of stress testing therefore would be appropriate to get a a Myoview stress test so that we can differentiate this from angina or esophageal pain. She does have a history of coronary stent placed by Dr. Dickie La a referral to cardiology should be considered along with appropriate stress testing.

## 2010-12-11 NOTE — Assessment & Plan Note (Signed)
The patient has been on Synthroid and will need a TSH T3 and T4 obtained today there is no indication that her thyroid is out of balance and that her weight metabolism appear to be stable

## 2010-12-11 NOTE — Assessment & Plan Note (Signed)
Her blood pressure is well controlled on her current medications no changes planned

## 2010-12-11 NOTE — Patient Instructions (Signed)
Victoria Gomez, it is appropriate for you to call Dr. Modesta Messing his office and schedule a lumbar injection for your back pain. We have referred you to [facility] cardiology for a stress Myoview to help [place] to understand whether or not your chest pain is coming from your Barrett's esophagus or your heart disease I have also set up a referral to a cardiologist for you to have a followup visit and an ongoing relationship.  We'll check appropriate laboratory values including your thyroid your cholesterol and a measure of how your kidneys are doing on her blood pressure medicines if these results are abnormal we will call you.  I will see you back in 2 months

## 2010-12-11 NOTE — Assessment & Plan Note (Signed)
She will self refer back to Dr. Ethelene Hal for a lumbar injection

## 2010-12-30 DIAGNOSIS — M129 Arthropathy, unspecified: Secondary | ICD-10-CM | POA: Insufficient documentation

## 2010-12-30 DIAGNOSIS — K648 Other hemorrhoids: Secondary | ICD-10-CM | POA: Insufficient documentation

## 2011-01-02 ENCOUNTER — Encounter: Payer: Self-pay | Admitting: Cardiology

## 2011-01-02 ENCOUNTER — Ambulatory Visit: Payer: Medicare Other | Admitting: Cardiovascular Disease

## 2011-01-02 ENCOUNTER — Ambulatory Visit (INDEPENDENT_AMBULATORY_CARE_PROVIDER_SITE_OTHER): Payer: Medicare Other | Admitting: Cardiology

## 2011-01-02 DIAGNOSIS — R072 Precordial pain: Secondary | ICD-10-CM

## 2011-01-07 NOTE — Assessment & Plan Note (Signed)
Summary: Specialty Services Required.conoary artery atherosclerois per...  Medications Added FISH OIL   OIL (FISH OIL) 2 by mouth daily      Allergies Added:   Visit Type:  Initial Consult Primary Provider:  Darryll Capers, MD  CC:  CAD.  History of Present Illness: The patient presents for evaluation of coronary disease. It has been several years since her last visit. She has a history of described below. She does report that 3 or 4 weeks ago she had an episode of chest discomfort requiring 2 sublingual nitroglycerin. This was a severe "hurting". She did not describe radiation to her jaw or to her arms.  She wasn't sure whether this was similar to previous angina. She did not describe associated nausea vomiting or diaphoresis. She didn't think the nitroglycerin might have helped. Since that time she said no further severe episodes. She has been under stress with a son who is undergoing chemotherapy. She's had some palpitations but no presyncope or syncope. She has had no PND or orthopnea. She has had some decreased exercise tolerance however. She and her husband are mall walkers.  Current Medications (verified): 1)  Aspirin Ec 81 Mg Tbec (Aspirin) .... Take 1 Tablet By Mouth Once A Day 2)  Isosorbide Mononitrate Cr 60 Mg Xr24h-Tab (Isosorbide Mononitrate) .[name].. 1 Two Times A Day Pt Request [place]ards Tabs 3)  Klor-Con 10 10 Meq Tbcr (Potassium Chloride) .... Take 1 Tablet By Mouth Once A Day 4)  Synthroid 50 Mcg Tabs (Levothyroxine Sodium) .... Name Brand Only 1 Once Daily 5)  Furosemide 40 Mg Tabs (Furosemide) .... One By Mouth Daily 6)  Bystolic 5 Mg Tabs (Nebivolol Hcl) .... One By Mouth Daly 7)  Multivitamins   Tabs (Multiple Vitamin) .... Once Daily 8)  Calcium 600/vitamin D 600-200 Mg-Unit  Tabs (Calcium Carbonate-Vitamin D) .... Two Times A Day 9)  Elmiron 100 Mg  Caps (Pentosan Polysulfate Sodium) .... One Tid 10)  Lansoprazole 30 Mg Tbdp (Lansoprazole) .... One By Mouth Two Times A  Day 11)  Exforge 5-320 Mg Tabs (Amlodipine Besylate-Valsartan) .... One By Mouth Daily 12)  Pravastatin Sodium 40 Mg Tabs (Pravastatin Sodium) .... One By Mouth Daily 13)  Nitrostat 0.4 Mg Subl (Nitroglycerin) .[name].. 1 Sublingual Prn Chest Paini 14)  Bromatan Plus 45-3.5-30 Mg/3ml Susp (Pse Tan-Dexchlor Tan-Dm Tan) .... Two Tsp By Mouth Two Times A Day 15)  Fish Oil   Oil (Fish Oil) .... 2 By Mouth Daily  Allergies (verified): 1)  ! Codeine 2)  ! Sulfa  Past History:  Past Medical History: CORONARY ARTERY DISEASE  (Dr. Rollene Rotunda with a history of coronary artery  disease, status post PCI of the RCA in 2001 with repeat PCI of the RCA  secondary to in-stent restenosis in December of 2003, and last  catheterization in October of 2004, revealing occluded RCA stent with distal  collateralization from septal perforator.) HYPERLIPIDEMIA  HYPERTENSION  INTERSTITIAL CYSTITIS ARTHRITIS INTERNAL HEMORRHOIDS  ACUTE BRONCHITIS ACUTE ON CHRONIC SYSTOLIC HEART FAILURE  Hx of BREAST MASS, BENIGN  EDEMA  EPISTAXIS, RECURRENT  COLORECTAL CANCER, FAMILY HX FRACTURE, RIGHT HAND FRACTURE, HAND DIARRHEA ESOPHAGEAL REFLUX HYPOTHYROIDISM DISORDER, BLADDER NEC ADVEF, DRUG/MEDICINAL/BIOLOGICAL SUBST NOS DIVERTICULOSIS, COLON  BARRETT'S ESOPHAGUS, HX OF   Past Surgical History: Stent 06/08/00 Hysterectomy Cholecystectomy C-section Appendectomy Cataract Extraction-bilaterally Tonsillectomy Left ankle repair Right wrist repair.  Family History: Reviewed history from 11/16/2008 and no changes required. Family History Hypertension Family History of Colon Cancer: Mother Family History of Colon Polyps: Sister Family  History of Diabetes: Son Family History of Heart Disease: Mother, Father, Brother x2, Sister  Social History: Reviewed history from 11/16/2008 and no changes required. Retired Married Alcohol use-no Patient is a former smoker. -stopped 40 years ago Illicit Drug Use -  no Patient does not get regular exercise.   Review of Systems       As stated in the HPI and negative for all other systems.   Vital Signs:  Patient profile:   77 year old female Height:      68 inches Weight:      165 pounds BMI:     25.18 Pulse rate:   55 / minute Resp:     16 per minute BP sitting:   138 / 84  (right arm)  Vitals Entered By: Marrion Coy, CNA (January 02, 2011 3:09 PM)  Physical Exam  General:  Well developed, well nourished, in no acute distress. Head:  normocephalic and atraumatic Eyes:  PERRLA/EOM intact; conjunctiva and lids normal. Mouth:  Teeth, gums and palate normal. Oral mucosa normal. Neck:  Neck supple, no JVD. No masses, thyromegaly or abnormal cervical nodes. Chest Wall:  no deformities or breast masses noted Lungs:  Clear bilaterally to auscultation and percussion. Abdomen:  Bowel sounds positive; abdomen soft and non-tender without masses, organomegaly, or hernias noted. No hepatosplenomegaly. Msk:  Back normal, normal gait. Muscle strength and tone normal. Extremities:  No clubbing or cyanosis. Neurologic:  Alert and oriented x 3. Skin:  Intact without lesions or rashes. Cervical Nodes:  no significant adenopathy Axillary Nodes:  no significant adenopathy Inguinal Nodes:  no significant adenopathy Psych:  Normal affect.   Detailed Cardiovascular Exam  Neck    Carotids: Carotids full and equal bilaterally without bruits.      Neck Veins: Normal, no JVD.    Heart    Inspection: no deformities or lifts noted.      Palpation: normal PMI with no thrills palpable.      Auscultation: regular rate and rhythm, S1, S2 without murmurs, rubs, gallops, or clicks.    Vascular    Abdominal Aorta: no palpable masses, pulsations, or audible bruits.      Femoral Pulses: normal femoral pulses bilaterally.      Pedal Pulses: normal pedal pulses bilaterally.      Radial Pulses: normal radial pulses bilaterally.      Peripheral Circulation: no  clubbing, cyanosis, or edema noted with normal capillary refill.     EKG  Procedure date:  01/02/2011  Findings:      Normal sinus rhythm, premature atrial contractions, left bundle branch block, right axis deviation  Impression & Recommendations:  Problem # 1:  CORONARY ARTERY DISEASE (ICD-414.00) It has been several years since her last stress testing catheterization. She has had recurrent discomfort. She would need a pharmacologic perfusion study given her baseline conduction abnormalities. Until then she will continue with risk reduction. Of note I do understand with her occluded right coronary that her stress test might be somewhat more difficult to interpret. Orders: Nuclear Stress Test (Nuc Stress Test)  Problem # 2:  HYPERTENSION (ICD-401.9) Her blood pressure is controlled and she will continue the meds as listed.  Problem # 3:  HYPERLIPIDEMIA (ICD-272.4) Her LDL in Feb was 79.7.  I do not see a recent HDL.  She will continue the meds as listed. Her updated medication list for this problem includes:    Pravastatin Sodium 40 Mg Tabs (Pravastatin sodium) ..... One by mouth daily  Patient  Instructions: 1)  Your physician recommends that you schedule a follow-up appointment in: 12 months with Dr Antoine Poche 2)  Your physician recommends that you continue on your current medications as directed. Please refer to the Current Medication list given to you today. 3)  Your physician has requested that you have an adenosine myoview.  For further information please visit [email or web address]  Please follow instruction sheet, as given.

## 2011-01-14 ENCOUNTER — Telehealth (INDEPENDENT_AMBULATORY_CARE_PROVIDER_SITE_OTHER): Payer: Self-pay | Admitting: *Deleted

## 2011-01-15 ENCOUNTER — Ambulatory Visit (HOSPITAL_COMMUNITY): Payer: Medicare Other | Attending: Cardiology

## 2011-01-15 ENCOUNTER — Encounter: Payer: Self-pay | Admitting: Cardiology

## 2011-01-15 DIAGNOSIS — W1789XA Other fall from one level to another, initial encounter: Secondary | ICD-10-CM | POA: Insufficient documentation

## 2011-01-15 DIAGNOSIS — M79609 Pain in unspecified limb: Secondary | ICD-10-CM | POA: Insufficient documentation

## 2011-01-15 DIAGNOSIS — R0989 Other specified symptoms and signs involving the circulatory and respiratory systems: Secondary | ICD-10-CM

## 2011-01-15 DIAGNOSIS — Y9279 Other farm location as the place of occurrence of the external cause: Secondary | ICD-10-CM | POA: Insufficient documentation

## 2011-01-15 DIAGNOSIS — R0789 Other chest pain: Secondary | ICD-10-CM

## 2011-01-15 DIAGNOSIS — M7989 Other specified soft tissue disorders: Secondary | ICD-10-CM | POA: Insufficient documentation

## 2011-01-15 DIAGNOSIS — I447 Left bundle-branch block, unspecified: Secondary | ICD-10-CM

## 2011-01-15 DIAGNOSIS — S8010XA Contusion of unspecified lower leg, initial encounter: Secondary | ICD-10-CM | POA: Insufficient documentation

## 2011-01-15 DIAGNOSIS — Y998 Other external cause status: Secondary | ICD-10-CM | POA: Insufficient documentation

## 2011-01-15 DIAGNOSIS — I2510 Atherosclerotic heart disease of native coronary artery without angina pectoris: Secondary | ICD-10-CM

## 2011-01-21 NOTE — Assessment & Plan Note (Addendum)
Summary: Cardiology Nuclear Testing  Nuclear Med Background Indications for Stress Test: Evaluation for Ischemia, Stent Patency, PTCA Patency   History: Angioplasty, Heart Catheterization, Myocardial Perfusion Study, Stents  History Comments: '01 Stent-RCA; '03 PTCA-RCA; '04 Cath: RCA occluded  w/collaterals, EF=65%, med. tx.;  '09 ZOX:WRUEAV, EF=75%; h/o CHF  Symptoms: Chest Pain, Chest Tightness, Dizziness, DOE, Fatigue, Fatigue with Exertion, Palpitations, Rapid HR  Symptoms Comments: Last episode of CP:3-4 weeks ago.   Nuclear Pre-Procedure Cardiac Risk Factors: Family History - CAD, History of Smoking, Hypertension, LBBB, Lipids Caffeine/Decaff Intake: None NPO After: 7:00 AM Lungs: Clear.  O2 sat 97% on RA. IV 0.9% NS with Angio Cath: 18g     IV Site: R Antecubital IV Started by: Stanton Kidney, EMT-P Chest Size (in) 38     Cup Size B     Height (in): 63 Weight (lb): 163 BMI: 28.98 Tech Comments: Bystolic held this a.m., per patient  Nuclear Med Study 1 or 2 day study:  1 day     Stress Test Type:  Adenosine Reading MD:  Willa Rough, MD     Referring MD:  Rollene Rotunda, MD Resting Radionuclide:  Technetium 27m Tetrofosmin     Resting Radionuclide Dose:  10.9 mCi  Stress Radionuclide:  Technetium 66m Tetrofosmin     Stress Radionuclide Dose:  33 mCi   Stress Protocol  Dose of Adenosine:  41.5 mg    Stress Test Technologist:  Rea College, CMA-N     Nuclear Technologist:  Doyne Keel, CNMT  Rest Procedure  Myocardial perfusion imaging was performed at rest 45 minutes following the intravenous administration of Technetium 71m Tetrofosmin.  Stress Procedure  The patient received IV adenosine at 140 mcg/kg/min for 4 minutes. There were no significant changes with infusion, only occasional PAC's. She did c/o chest pressure with infusion.  Technetium 33m Tetrofosmin was injected at the 2 minute mark and quantitative spect images were obtained after a 45 minute  delay.  QPS Raw Data Images:  Normal; no motion artifact; normal heart/lung ratio. Stress Images:  Moderate decrease in activity in the anterior wall. Rest Images:  Mild decrease in activity in the anterior wall. Subtraction (SDS):  Reversibility in the anterior wall. Transient Ischemic Dilatation:  1.50  (Normal <1.22)  Lung/Heart Ratio:  0.34  (Normal <0.45)  Quantitative Gated Spect Images QGS EDV:  81 ml QGS ESV:  45 ml QGS EF:  45 % QGS cine images:  Dyssynergy of many walls  Findings abnormal      Overall Impression  Exercise Capacity: Adenosine study with no exercise.  (LBBB) BP Response: Normal blood pressure response. Clinical Symptoms: SOB ECG Impression: LBBB Overall Impression Comments: The images have changed since the prior study. I can not be sure how much is related to LBBB. However, LBBB was described at the time of the prior study. There is decreased activity in the anterior wall with reversibility. This suggests anterior ischemia. Wall motion reveals dyssynergy in several walls (which seems unusual). The study is read as showing moderate anterior ischemia  Appended Document: Cardiology Nuclear Testing      Needs  a follow up with me to discuss abnormal results.  Appended Document: Cardiology Nuclear Testing pt aware of resutls

## 2011-01-21 NOTE — Progress Notes (Signed)
Summary: Nuclear Pre-Procedure  Phone Note Outgoing Call Call back at [facility] Phone [phone]   Call placed by: Stanton Kidney, EMT-P,  January 14, 2011 2:58 PM Action Taken: Phone Call Completed Summary of Call: Left message with information on Myoview Information Sheet (see scanned document for details). Stanton Kidney, EMT-P  January 14, 2011 2:59 PM      Nuclear Med Background Indications for Stress Test: Evaluation for Ischemia, Stent Patency, PTCA Patency   History: Angioplasty, Heart Catheterization, Myocardial Perfusion Study, Stents  History Comments: '01 RCA Stent '03 Angioplasty '04 Heart Cath: EF=65%, RCA occluded collaterals 10/09 MPS: EF= 75%, (-) ischemis/scar  Symptoms: Chest Pain, Fatigue, Palpitations    Nuclear Pre-Procedure Cardiac Risk Factors: Family History - CAD, History of Smoking, Hypertension, LBBB, Lipids Height (in): 68

## 2011-01-23 ENCOUNTER — Telehealth: Payer: Self-pay | Admitting: Cardiology

## 2011-01-27 ENCOUNTER — Encounter: Payer: Self-pay | Admitting: Cardiology

## 2011-01-27 ENCOUNTER — Ambulatory Visit (INDEPENDENT_AMBULATORY_CARE_PROVIDER_SITE_OTHER): Payer: Medicare Other | Admitting: Cardiology

## 2011-01-27 ENCOUNTER — Other Ambulatory Visit: Payer: Self-pay | Admitting: Cardiology

## 2011-01-27 DIAGNOSIS — R0789 Other chest pain: Secondary | ICD-10-CM

## 2011-01-27 DIAGNOSIS — I200 Unstable angina: Secondary | ICD-10-CM

## 2011-01-27 DIAGNOSIS — Z01810 Encounter for preprocedural cardiovascular examination: Secondary | ICD-10-CM

## 2011-01-27 DIAGNOSIS — I2510 Atherosclerotic heart disease of native coronary artery without angina pectoris: Secondary | ICD-10-CM

## 2011-01-27 DIAGNOSIS — R9431 Abnormal electrocardiogram [ECG] [EKG]: Secondary | ICD-10-CM

## 2011-01-27 LAB — CBC WITH DIFFERENTIAL/PLATELET
Basophils Absolute: 0 10*3/uL (ref 0.0–0.1)
Eosinophils Absolute: 0.3 10*3/uL (ref 0.0–0.7)
Hemoglobin: 12.3 g/dL (ref 12.0–15.0)
Lymphocytes Relative: 19.2 % (ref 12.0–46.0)
MCHC: 33.9 g/dL (ref 30.0–36.0)
Monocytes Relative: 7.8 % (ref 3.0–12.0)
Neutro Abs: 4.7 10*3/uL (ref 1.4–7.7)
Neutrophils Relative %: 68.4 % (ref 43.0–77.0)
RDW: 14 % (ref 11.5–14.6)

## 2011-01-27 LAB — BASIC METABOLIC PANEL
CO2: 28 mEq/L (ref 19–32)
Chloride: 106 mEq/L (ref 96–112)
Potassium: 3.8 mEq/L (ref 3.5–5.1)
Sodium: 139 mEq/L (ref 135–145)

## 2011-01-27 LAB — PROTIME-INR
INR: 1 ratio (ref 0.8–1.0)
Prothrombin Time: 10.8 s (ref 10.2–12.4)

## 2011-01-27 LAB — APTT: aPTT: 25.8 s (ref 21.7–28.8)

## 2011-01-28 NOTE — Progress Notes (Signed)
Summary: pt would like results of stress test   Phone Note Call from Patient   Caller: Patient Reason for Call: Talk to Nurse, Talk to Doctor, Lab or Test Results, Privacy/Consent Authorization Summary of Call: pt would like results of stress test Initial call taken by: Omer Jack,  January 23, 2011 4:14 PM  Follow-up for Phone Call        pt awa re of results and an appointment was given to pt for 01/27/2011 at 9:15 am.  Pt aware to call back if she has problems prior to them Follow-up by: Charolotte Capuchin, RN,  January 23, 2011 4:40 PM

## 2011-01-31 ENCOUNTER — Inpatient Hospital Stay (HOSPITAL_COMMUNITY)
Admission: RE | Admit: 2011-01-31 | Discharge: 2011-01-31 | Disposition: A | Discharge status: Home / Self Care | Payer: Medicare Other | Source: Ambulatory Visit | Attending: Cardiology | Admitting: Cardiology

## 2011-01-31 DIAGNOSIS — T82897A Other specified complication of cardiac prosthetic devices, implants and grafts, initial encounter: Secondary | ICD-10-CM | POA: Insufficient documentation

## 2011-01-31 DIAGNOSIS — I2510 Atherosclerotic heart disease of native coronary artery without angina pectoris: Secondary | ICD-10-CM | POA: Insufficient documentation

## 2011-01-31 DIAGNOSIS — Y831 Surgical operation with implant of artificial internal device as the cause of abnormal reaction of the patient, or of later complication, without mention of misadventure at the time of the procedure: Secondary | ICD-10-CM | POA: Insufficient documentation

## 2011-01-31 DIAGNOSIS — R079 Chest pain, unspecified: Secondary | ICD-10-CM | POA: Insufficient documentation

## 2011-01-31 DIAGNOSIS — Z9861 Coronary angioplasty status: Secondary | ICD-10-CM | POA: Insufficient documentation

## 2011-02-06 NOTE — Letter (Signed)
Summary: Cardiac Catheterization Instructions- [facility]  [employer], Main Office  [address]   [place], [place] [address]   Phone: [phone]  Fax: [phone]     01/27/2011 MRN: [id number]  [facility] [address] [place], [place]  [address]  [place]  Dear Ms. Peet,   You are scheduled for a Cardiac Catheterization on Friday January 10, 2011  with Dr.Maral Lampe Please arrive to the 1st floor of the Heart and Vascular Center at [facility] at 6:30 am on the day of your procedure. Please do not arrive before 6:30 a.m. Call the Heart and Vascular Center at [phone] if you are unable to make your appointmnet. The Code to get into the parking garage under the building is 3000. Take the elevators to the 1st floor. You must have someone to drive you home. Someone must be with you for the first 24 hours after you arrive home. Please wear clothes that are easy to get on and off and wear slip-on shoes. Do not eat or drink after midnight except water with your medications that morning. Bring all your medications and current insurance cards with you.    ___ Make sure you take your aspirin.  ___ You may take ALL of your medications with water that morning.  The usual length of stay after your procedure is 2 to 3 hours. This can vary.  If you have any questions, please call the office at the number listed above.   Rocco Serene, RN

## 2011-02-06 NOTE — Assessment & Plan Note (Signed)
Summary: f/u abnormal myoview   appt 9:15am  PF,RN  Medications Added ASPIRIN EC 81 MG TBEC (ASPIRIN) 4 tablets daily ISOSORBIDE MONONITRATE CR 60 MG XR24H-TAB (ISOSORBIDE MONONITRATE) take 2 tablets at the same time daily BYSTOLIC 5 MG TABS (NEBIVOLOL HCL) take 1 and 1/2 tablets daily      Allergies Added:   Visit Type:  Follow-up Primary Provider:  Darryll Capers, MD  CC:  CAD and Chest pain.  History of Present Illness: The patient presents for followup of her known coronary disease. Since I last saw her she has had more chest discomfort and actually yesterday took sublingual nitroglycerin. This discomfort is epigastric.  She has described it as a severe "hurting". She did not describe radiation to her jaw or to her arms.  She wasn't sure whether this was similar to previous angina. She did not describe associated nausea vomiting or diaphoresis.   I did review with her her stress perfusion study.  It demonstrated decreased activity in the anterior wall with reversibility. This suggests anterior ischemia. Wall motion reveals dyssynergy in several walls (which seems unusual). The study is read as showing moderate anterior ischemia.  Current Medications (verified): 1)  Aspirin Ec 81 Mg Tbec (Aspirin) .... Take 1 Tablet By Mouth Once A Day 2)  Isosorbide Mononitrate Cr 60 Mg Xr24h-Tab (Isosorbide Mononitrate) .[name].. 1 Two Times A Day Pt Request [place] Tabs 3)  Klor-Con 10 10 Meq Tbcr (Potassium Chloride) .... Take 1 Tablet By Mouth Once A Day 4)  Synthroid 50 Mcg Tabs (Levothyroxine Sodium) .... Name Brand Only 1 Once Daily 5)  Furosemide 40 Mg Tabs (Furosemide) .... One By Mouth Daily 6)  Bystolic 5 Mg Tabs (Nebivolol Hcl) .... One By Mouth Daly 7)  Multivitamins   Tabs (Multiple Vitamin) .... Once Daily 8)  Calcium 600/vitamin D 600-200 Mg-Unit  Tabs (Calcium Carbonate-Vitamin D) .... Two Times A Day 9)  Elmiron 100 Mg  Caps (Pentosan Polysulfate Sodium) .... One Tid 10)  Lansoprazole  30 Mg Tbdp (Lansoprazole) .... One By Mouth Two Times A Day 11)  Exforge 5-320 Mg Tabs (Amlodipine Besylate-Valsartan) .... One By Mouth Daily 12)  Pravastatin Sodium 40 Mg Tabs (Pravastatin Sodium) .... One By Mouth Daily 13)  Nitrostat 0.4 Mg Subl (Nitroglycerin) .[name].. 1 Sublingual Prn Chest Paini 14)  Bromatan Plus 45-3.5-30 Mg/29ml Susp (Pse Tan-Dexchlor Tan-Dm Tan) .... Two Tsp By Mouth Two Times A Day  Allergies (verified): 1)  ! Codeine 2)  ! Sulfa  Past History:  Past Medical History: Reviewed history from 01/02/2011 and no changes required. CORONARY ARTERY DISEASE  (Dr. Rollene Rotunda with a history of coronary artery  disease, status post PCI of the RCA in 2001 with repeat PCI of the RCA  secondary to in-stent restenosis in December of 2003, and last  catheterization in October of 2004, revealing occluded RCA stent with distal  collateralization from septal perforator.) HYPERLIPIDEMIA  HYPERTENSION  INTERSTITIAL CYSTITIS ARTHRITIS INTERNAL HEMORRHOIDS  ACUTE BRONCHITIS ACUTE ON CHRONIC SYSTOLIC HEART FAILURE  Hx of BREAST MASS, BENIGN  EDEMA  EPISTAXIS, RECURRENT  COLORECTAL CANCER, FAMILY HX FRACTURE, RIGHT HAND FRACTURE, HAND DIARRHEA ESOPHAGEAL REFLUX HYPOTHYROIDISM DISORDER, BLADDER NEC ADVEF, DRUG/MEDICINAL/BIOLOGICAL SUBST NOS DIVERTICULOSIS, COLON  BARRETT'S ESOPHAGUS, HX OF   Past Surgical History: Reviewed history from 01/02/2011 and no changes required. Stent 06/08/00 Hysterectomy Cholecystectomy C-section Appendectomy Cataract Extraction-bilaterally Tonsillectomy Left ankle repair Right wrist repair.  Family History: Hypertension Colon Cancer: Mother Colon Polyps: Sister Diabetes: Son Heart Disease: Mother, Father, Brother x2,  Sister  Social History: Reviewed history from 11/16/2008 and no changes required. Retired Married Alcohol use-no Patient is a former smoker. -stopped 40 years ago Illicit Drug Use - no Patient does not get  regular exercise.   Review of Systems       As stated in the HPI and negative for all other systems.   Vital Signs:  Patient profile:   77 year old female Height:      63 inches Weight:      165 pounds Pulse rate:   70 / minute Resp:     18 per minute BP sitting:   158 / 72  (left arm) Cuff size:   regular  Vitals Entered By: Celestia Khat, CMA (January 27, 2011 9:10 AM)  Physical Exam  General:  Well developed, well nourished, in no acute distress. Head:  normocephalic and atraumatic Eyes:  PERRLA/EOM intact; conjunctiva and lids normal. Mouth:  Upper denturesl. Oral mucosa normal. Neck:  Neck supple, no JVD. No masses, thyromegaly or abnormal cervical nodes. Chest Wall:  no deformities or breast masses noted Lungs:  Clear bilaterally to auscultation and percussion. Abdomen:  Bowel sounds positive; abdomen soft and non-tender without masses, organomegaly, or hernias noted. No hepatosplenomegaly. Msk:  Back normal, normal gait. Muscle strength and tone normal. Extremities:  No clubbing or cyanosis. Neurologic:  Alert and oriented x 3. Skin:  Intact without lesions or rashes. Cervical Nodes:  no significant adenopathy Psych:  Normal affect.   Detailed Cardiovascular Exam  Neck    Carotids: Carotids full and equal bilaterally without bruits.      Neck Veins: Normal, no JVD.    Heart    Inspection: no deformities or lifts noted.      Palpation: normal PMI with no thrills palpable.      Auscultation: regular rate and rhythm, S1, S2 without murmurs, rubs, gallops, or clicks.    Vascular    Abdominal Aorta: no palpable masses, pulsations, or audible bruits.      Femoral Pulses: normal femoral pulses bilaterally.      Pedal Pulses: normal pedal pulses bilaterally.      Radial Pulses: normal radial pulses bilaterally.      Peripheral Circulation: no clubbing, cyanosis, or edema noted with normal capillary refill.     EKG  Procedure date:  01/27/2011  Findings:       sinus rhythm, left bundle branch block, left axis deviation  Impression & Recommendations:  Problem # 1:  CORONARY ARTERY DISEASE (ICD-414.00) Given her ongoing symptoms and her abnormal stress test cardiac catheterization is indicated. She will increase to work baby aspirin daily. She has been taking her Imdur b.i.d. the patient being once daily 120 mg. I will increase her beta blocker dose. She will electively have a cardiac catheterization were urgently if she has increased symptoms at which point she will present to the emergency room. Orders: EKG w/ Interpretation (93000) TLB-CBC Platelet - w/Differential (85025-CBCD) TLB-BMP (Basic Metabolic Panel-BMET) (80048-METABOL) TLB-PTT (85730-PTTL) TLB-PT (Protime) (85610-PTP)  Problem # 2:  HYPERLIPIDEMIA (ICD-272.4) I will follow up with a goal LDL less than 100 and HDL greater than 50. Her updated medication list for this problem includes:    Pravastatin Sodium 40 Mg Tabs (Pravastatin sodium) ..... One by mouth daily  Problem # 3:  HYPERTENSION (ICD-401.9) Her blood pressure remained slightly elevated she will be managed as above.  Patient Instructions: 1)  Your physician recommends that you have  lab work today:  BMP, CBC, PT and  PTT 2)  Your physician has recommended you make the following change in your medication: Increase ASA to 4 daily, take Isosorbide tablets together once daily, increase Bystolic to 5mg  - 1 and 1/2 tablets daily. 3)  Your physician has requested that you have a cardiac catheterization.  Cardiac catheterization is used to diagnose and/or treat various heart conditions. Doctors may recommend this procedure for a number of different reasons. The most common reason is to evaluate chest pain. Chest pain can be a symptom of coronary artery disease (CAD), and cardiac catheterization can show whether plaque is narrowing or blocking your heart's arteries. This procedure is also used to evaluate the valves, as well as measure  the blood flow and oxygen levels in different parts of your heart.  For further information please visit [email or web address]  Please follow instruction sheet, as given. Prescriptions: BYSTOLIC 5 MG TABS (NEBIVOLOL HCL) take 1 and 1/2 tablets daily  #45 x 6   Entered by:   Charolotte Capuchin, RN   Authorized by:   Rollene Rotunda, MD, [facility]   Signed by:   Charolotte Capuchin, RN on 01/27/2011   Method used:   Electronically to        [facility]  [address] [id number] ([facility]ail)       [address]       [place], [place]  [address]       Ph: [phone] or [phone]       Fax: [phone]   RxID:   [id number]  I have reviewed and approved all prescriptions at the time of this visit. Rollene Rotunda, MD, [facility]  January 27, 2011 9:59 AM

## 2011-02-10 ENCOUNTER — Ambulatory Visit: Payer: Medicare Other | Admitting: Internal Medicine

## 2011-02-19 ENCOUNTER — Telehealth: Payer: Self-pay | Admitting: Cardiology

## 2011-02-19 NOTE — Telephone Encounter (Signed)
Pt. Would like to know if she can be off Asprin for 7 days,  prior  back surgery. Pt. Need to know soon, so she can be put on the  scheduled for the procedure.

## 2011-02-24 NOTE — Telephone Encounter (Signed)
She can come off of ASA as needed if absolutely necessary to perform this procedure

## 2011-02-24 NOTE — Telephone Encounter (Signed)
I spoke with the pt and she is scheduled for an injection in her back on 02/28/11 with Dr Ethelene Hal.  I made her aware that per Dr Antoine Poche she can hold ASA for 5 days if necessary.  The pt did not take her ASA today, so she will start holding ASA from 02/24/11 until injection.

## 2011-03-06 ENCOUNTER — Encounter: Payer: Self-pay | Admitting: Internal Medicine

## 2011-03-06 ENCOUNTER — Ambulatory Visit: Payer: Medicare Other | Admitting: Internal Medicine

## 2011-03-06 DIAGNOSIS — I10 Essential (primary) hypertension: Secondary | ICD-10-CM

## 2011-03-06 DIAGNOSIS — E785 Hyperlipidemia, unspecified: Secondary | ICD-10-CM

## 2011-03-06 DIAGNOSIS — I2510 Atherosclerotic heart disease of native coronary artery without angina pectoris: Secondary | ICD-10-CM

## 2011-03-06 MED ORDER — LEVOTHYROXINE SODIUM 50 MCG PO TABS: 50.0000 ug | ORAL_TABLET | Freq: Every day | ORAL | Status: DC

## 2011-03-06 MED ORDER — BIO-FLAX 1000 MG PO CAPS: 1.0000 | ORAL_CAPSULE | Freq: Two times a day (BID) | ORAL | Status: DC

## 2011-03-06 MED ORDER — AMLODIPINE BESYLATE-VALSARTAN 5-320 MG PO TABS: 1.0000 | ORAL_TABLET | Freq: Every day | ORAL | Status: DC

## 2011-03-06 NOTE — Progress Notes (Signed)
Subjective:    Patient ID: Victoria Gomez, female    DOB: [birth year], 77 y.o.   MRN: [id number]  HPI The patient had a thorough workup with Dr. [place] Lions including stress test and subsequent cardiac catheterization she did have multivessel cardiovascular disease the report reads multiple 25-40 occlusions but only one vs with severe dz. The conclusion was to continue medical management to control her angina.  . In light of her recent chest pain this is reassuring and we will aggressively control her cholesterol and blood pressure   Review of Systems  Constitutional: Negative for activity change, appetite change and fatigue.  HENT: Negative for ear pain, congestion, neck pain, postnasal drip and sinus pressure.   Eyes: Negative for redness and visual disturbance.  Respiratory: Negative for cough, shortness of breath and wheezing.   Gastrointestinal: Negative for abdominal pain and abdominal distention.  Genitourinary: Negative for dysuria, frequency and menstrual problem.  Musculoskeletal: Negative for myalgias, joint swelling and arthralgias.  Skin: Negative for rash and wound.  Neurological: Negative for dizziness, weakness and headaches.  Hematological: Negative for adenopathy. Does not bruise/bleed easily.  Psychiatric/Behavioral: Negative for sleep disturbance and decreased concentration.       Past Medical History  Diagnosis Date  . HYPERLIPIDEMIA 03/07/2008  . HYPERTENSION 08/03/2007  . CORONARY ARTERY DISEASE 08/03/2007  . Acute on chronic systolic heart failure 05/23/2009  . HYPOTHYROIDISM 08/03/2007  . Esophageal reflux 10/18/2007  . DIVERTICULOSIS, COLON 08/03/2007  . DISORDER, BLADDER NEC 08/03/2007  . BREAST MASS, BENIGN 05/23/2009  . Edema 02/19/2009  . BARRETT'S ESOPHAGUS, HX OF 08/03/2007  . Interstitial cystitis    Past Surgical History  Procedure Date  . Breast surgery     benign mass  . Cholecystectomy   . Cesarean section   . Appendectomy   . Cataract extraction,  bilateral   . Tonsillectomy   . Abdominal hysterectomy     reports that she has never smoked. She has never used smokeless tobacco. She reports that she does not drink alcohol or use illicit drugs. family history includes Colon cancer in her mother; Colon polyps in her sister; Coronary artery disease in her brother, father, mother, and sister; and Diabetes type II in her son. Allergies  Allergen Reactions  . Codeine   . Sulfonamide Derivatives     Objective:   Physical Exam  Constitutional: She is oriented to person, place, and time. She appears well-developed and well-nourished. No distress.  HENT:  Head: Normocephalic and atraumatic.  Right Ear: External ear normal.  Left Ear: External ear normal.  Nose: Nose normal.  Mouth/Throat: Oropharynx is clear and moist.  Eyes: Conjunctivae and EOM are normal. Pupils are equal, round, and reactive to light.  Neck: Normal range of motion. Neck supple. No JVD present. No tracheal deviation present. No thyromegaly present.  Cardiovascular: Normal rate, regular rhythm, normal heart sounds and intact distal pulses.   No murmur heard. Pulmonary/Chest: Effort normal and breath sounds normal. She has no wheezes. She exhibits no tenderness.  Abdominal: Soft. Bowel sounds are normal.  Musculoskeletal: Normal range of motion. She exhibits no edema and no tenderness.  Lymphadenopathy:    She has no cervical adenopathy.  Neurological: She is alert and oriented to person, place, and time. She has normal reflexes. No cranial nerve deficit.  Skin: Skin is warm and dry. She is not diaphoretic.  Psychiatric: She has a normal mood and affect. Her behavior is normal.          Assessment &  Plan:  Review the results of the coronary study for her since she switched in the summer and to see what her give her copy of her catheter report should she have any chest pain requires her to seek attention.  Keep her current medications stable and aggressively  control her cholesterol she is currently on pravastatin she could not tolerate Crestor in the past she also cannot take fish oil  We have recommended the addition of flaxseed oil to her cholesterol drugs

## 2011-03-07 NOTE — Cardiovascular Report (Signed)
  NAME:  Victoria Gomez, Victoria Gomez NO.:  [id number]  MEDICAL RECORD NO.:  [id number]           PATIENT TYPE:  LOCATION:                                 FACILITY:  PHYSICIAN:  Rollene Rotunda, MD, FACCDATE OF BIRTH:  [birth year]  DATE OF PROCEDURE:  01/31/2011 DATE OF DISCHARGE:                           CARDIAC CATHETERIZATION   PRIMARY CARE PHYSICIAN:  Stacie Glaze, MD  CARDIOLOGIST:  Rollene Rotunda, MD, [facility]  PROCEDURE:  Left heart catheterization/coronary arteriography.  INDICATIONS:  Evaluate the patient with chest pain and known coronary disease with previously occluded right coronary artery status post stenting.  The right coronary was occluded in the stent in a 2004 catheterization.  PROCEDURE NOTE:  Left heart catheterization was performed via the right femoral artery.  The artery was cannulated using anterior wall puncture. A #4-French sheath was inserted via the modified Seldinger technique. Preformed Judkins and a pigtail catheter were utilized.  The patient tolerated the procedure well and left the lab in stable condition.  RESULTS:  Hemodynamics:  LV 156/12, AO 155/95.  Coronaries:  Left main was normal.  The LAD had proximal circumferential 40% stenosis.  There was mid 25% stenosis.  First diagonal was small with ostial 80% stenosis.  The circumflex was a large vessel.  In the AV groove, there is 25% stenosis.  First obtuse marginal was large and branching.  There was proximal 25% stenosis.  In the inferior branch, there was long 60% stenosis.  Right coronary artery was occluded at the ostium.  It was noted to fill via collaterals and there was not high- grade disease visualized in the PDA.  Left ventriculogram.  The left ventriculogram was obtained in the RAO projection.  The EF was 55% with mild inferior hypokinesis.  CONCLUSION:  Severe single-vessel coronary artery disease. Nonobstructive disease elsewhere.[name]  PLAN:  The patient will have  continued medical management.     Rollene Rotunda, MD, [facility]     JH/MEDQ  D:  01/31/2011  T:  02/01/2011  Job:  [id number]  cc:   Stacie Glaze, MD  Electronically Signed by Rollene Rotunda MD [facility] on 03/07/2011 11:43:18 AM

## 2011-03-11 ENCOUNTER — Other Ambulatory Visit: Payer: Self-pay | Admitting: Internal Medicine

## 2011-03-11 DIAGNOSIS — Z1231 Encounter for screening mammogram for malignant neoplasm of breast: Secondary | ICD-10-CM

## 2011-03-24 ENCOUNTER — Encounter: Payer: Self-pay | Admitting: Cardiology

## 2011-03-25 ENCOUNTER — Encounter: Payer: Self-pay | Admitting: Cardiology

## 2011-03-25 ENCOUNTER — Ambulatory Visit (INDEPENDENT_AMBULATORY_CARE_PROVIDER_SITE_OTHER): Payer: Medicare Other | Admitting: Cardiology

## 2011-03-25 DIAGNOSIS — I10 Essential (primary) hypertension: Secondary | ICD-10-CM

## 2011-03-25 DIAGNOSIS — E785 Hyperlipidemia, unspecified: Secondary | ICD-10-CM

## 2011-03-25 DIAGNOSIS — I2510 Atherosclerotic heart disease of native coronary artery without angina pectoris: Secondary | ICD-10-CM

## 2011-03-25 NOTE — Assessment & Plan Note (Signed)
Blood pressure is controlled. She will continue meds as listed.

## 2011-03-25 NOTE — Assessment & Plan Note (Signed)
Her coronary anatomy is unchanged. She will continue with risk reduction.

## 2011-03-25 NOTE — Op Note (Signed)
NAMEARNOLD, Victoria Gomez                ACCOUNT NO.:  [id number]   MEDICAL RECORD NO.:  [id number]          PATIENT TYPE:  AMB   LOCATION:  DAY                          FACILITY:  [facility]   PHYSICIAN:  Excell Seltzer. Annabell Howells, M.D.    DATE OF BIRTH:  19-Feb-1925   DATE OF PROCEDURE:  08/31/2007  DATE OF DISCHARGE:                               OPERATIVE REPORT   PREOPERATIVE DIAGNOSIS:  History of interstitial cystitis with the  bladder wall lesions.   POSTOPERATIVE DIAGNOSIS:  History of interstitial cystitis with the  bladder wall lesions.   PROCEDURE:  1. Cystoscopy.  2. Hydrodistention of the bladder.  3. Biopsy and fulguration of median bladder wall lesion.  4. Urethral dilation.  5. Installation of Pyridium and Marcaine.   SURGEON:  Excell Seltzer. Annabell Howells, M.D.   ANESTHESIA:  General.   DRAINS:  Foley.   SPECIMENS:  Right and left bladder wall cup biopsies to pathology.   COMPLICATIONS:  None.   INDICATIONS:  The patient is an 77 year old white female with a history  of interstitial cystitis who was found to have bladder wall lesions on  cystoscopy.  It was felt that cysto, hydrodistention of the bladder and  biopsy with fulguration of the bladder wall lesions was indicated.   FINDINGS AND DESCRIPTION OF THE OPERATION:  The patient is given Cipro.  She was brought operating room where a general anesthetic was induced.  She was placed in the lithotomy position.  Her perineum and genitalia  were prepped with Betadine solution and she was draped in the usual  sterile fashion.  Cystoscopy was performed using the 22 [place] scope and  12 and 70-degree lenses.  Examination revealed a normal urethra with a  few benign polyps at the bladder neck.  The bladder wall had mild-to-  moderate trabeculation.  There was an approximately 1-2 x 3 cm flat  erythematous lesion on the left lateral wall, which was suspicious for  inflammation versus carcinoma in situ.  There was a similar, but smaller  lesion on  the right lateral wall.  The ureteral orifices were  unremarkable.   After completion of cystoscopy hydrodistention was performed filling the  bladder to 80 cmH2O pressure to capacity.  The bladder was then drained.  Her capacity under anesthesia was only about 450 mL.   Repeat cystoscopy after hydrodistention revealed bleeding from the two  mucosal lesions, but no other areas of glomerulization.   A cup biopsy forceps was then used to biopsy the right and left lateral  wall lesions, both of which were widely fulgurated with the Bugbee  electrode.  At this point the urethra was calibrated with female sounds.  She had no evidence of stricture up to a 14 [place].   A 20 French Foley catheter was inserted.  The balloon was filled with 10  mL of sterile fluid.  The bladder was instilled with 15 mL of a quarter  percent Marcaine and 400 mg of crushed Pyridium.  This was left  indwelling for approximately 5 minutes before the catheter was placed to  drainage.   The  patient was taken down from the lithotomy position.  Her anesthetic  was reversed.  She was admitted to the recovery room in stable  condition.   There were no complications.      Excell Seltzer. Annabell Howells, M.D.  Electronically Signed     JJW/MEDQ  D:  08/31/2007  T:  08/31/2007  Job:  [id number]   cc:   Stacie Glaze, MD  [address] [place]  [place] [address]

## 2011-03-25 NOTE — Assessment & Plan Note (Signed)
Her LDL was 79 in February. She will continue with meds as listed.

## 2011-03-25 NOTE — Patient Instructions (Signed)
MD. recommends for you to take Melatonin over the counter for sleep. Your physician recommends that you schedule a follow-up appointment in: 6 months with Dr. Antoine Poche.

## 2011-03-25 NOTE — Progress Notes (Signed)
HPI The patient presents for followup after recent catheterization with results described below. She was having some chest discomfort prior to this catheterization but she is no longer having this. She denies any palpitations, presyncope or syncope. She is having no new shortness of breath, PND or orthopnea. He complains of just not having any energy.  Left main was normal. The LAD had proximal circumferential  40% stenosis. There was mid 25% stenosis. First diagonal was small  with ostial 80% stenosis. The circumflex was a large vessel. In the AV  groove, there is 25% stenosis. First obtuse marginal was large and  branching. There was proximal 25% stenosis. In the inferior branch,  there was long 60% stenosis. Right coronary artery was occluded at the  ostium. It was noted to fill via collaterals and there was not high-  grade disease visualized in the PDA.    Allergies  Allergen Reactions  . Codeine   . Sulfonamide Derivatives     Current Outpatient Prescriptions  Medication Sig Dispense Refill  . amLODipine-valsartan (EXFORGE) 5-320 MG per tablet Take 1 tablet by mouth daily.  90 tablet  3  . aspirin 81 MG tablet Take 81 mg by mouth daily.        . Calcium Carbonate-Vitamin D (CALCIUM 600+D) 600-200 MG-UNIT TABS Take by mouth 2 (two) times daily.        . furosemide (LASIX) 40 MG tablet Take 40 mg by mouth daily.        [name] HYDROcodone-acetaminophen (NORCO) 5-325 MG per tablet       . isosorbide mononitrate (IMDUR) 60 MG 24 hr tablet Take 60 mg by mouth 2 (two) times daily. Pt request west-wards tabs       . lansoprazole (PREVACID) 30 MG capsule Take 30 mg by mouth 2 (two) times daily.        [name] levothyroxine (SYNTHROID, LEVOTHROID) 50 MCG tablet Take 1 tablet (50 mcg total) by mouth daily. Name brand only  90 tablet  3  . Multiple Vitamin (MULTIVITAMIN) tablet Take 1 tablet by mouth daily.        . nebivolol (BYSTOLIC) 5 MG tablet Take 5 mg by mouth daily.        . nitroGLYCERIN  (NITROSTAT) 0.4 MG SL tablet Place 0.4 mg under the tongue every 5 (five) minutes as needed. If pain after 3 pills- call 911       . pentosan polysulfate (ELMIRON) 100 MG capsule Take 100 mg by mouth 3 (three) times daily before meals.        . potassium chloride (KLOR-CON 10) 10 MEQ CR tablet Take 10 mEq by mouth daily.        . pravastatin (PRAVACHOL) 40 MG tablet Take 40 mg by mouth daily. Pt request west-wards tabs       . Flaxseed, Linseed, (BIO-FLAX) 1000 MG CAPS Take 1 capsule by mouth 2 (two) times daily.    0  . DISCONTD: brompheniramine-pseudoephedrine-DM 30-2-10 MG/5ML syrup Take 10 mLs by mouth 2 (two) times daily.          Past Medical History  Diagnosis Date  . HYPERLIPIDEMIA 03/07/2008  . HYPERTENSION 08/03/2007  . CORONARY ARTERY DISEASE 08/03/2007  . Acute on chronic systolic heart failure 05/23/2009  . HYPOTHYROIDISM 08/03/2007  . Esophageal reflux 10/18/2007  . DIVERTICULOSIS, COLON 08/03/2007  . DISORDER, BLADDER NEC 08/03/2007  . BREAST MASS, BENIGN 05/23/2009  . Edema 02/19/2009  . BARRETT'S ESOPHAGUS, HX OF 08/03/2007  . Interstitial cystitis   .  Arthritis   . Internal hemorrhoids   . Epistaxis   . Right hand fracture   . Diarrhea   . Bladder disorder     Past Surgical History  Procedure Date  . Breast surgery     benign mass  . Cholecystectomy   . Cesarean section   . Appendectomy   . Cataract extraction, bilateral   . Tonsillectomy   . Abdominal hysterectomy   . Coronary stent placement   . Ankle reconstruction     Left  . Wrist reconstruction     Right    ROS:  Fatigue, insominia.  Otherwise as stated in the HPI and negative for all other systems. PHYSICAL EXAM BP 116/58  Pulse 68  Ht 5\' 3"  (1.6 m)  Wt 168 lb 1.9 oz (76.259 kg)  BMI 29.78 kg/m2 GENERAL:  Well appearing HEENT:  Pupils equal round and reactive, fundi not visualized, oral mucosa unremarkable NECK:  No jugular venous distention, waveform within normal limits, carotid upstroke brisk  and symmetric, no bruits, no thyromegaly LYMPHATICS:  No cervical, inguinal adenopathy LUNGS:  Clear to auscultation bilaterally BACK:  No CVA tenderness CHEST:  Unremarkable HEART:  PMI not displaced or sustained,S1 and S2 within normal limits, no S3, no S4, no clicks, no rubs, no murmurs ABD:  Flat, positive bowel sounds normal in frequency in pitch, no bruits, no rebound, no guarding, no midline pulsatile mass, no hepatomegaly, no splenomegaly EXT:  2 plus pulses throughout, no edema, no cyanosis no clubbing SKIN:  No rashes no nodules NEURO:  Cranial nerves II through XII grossly intact, motor grossly intact throughout PSYCH:  Cognitively intact, oriented to person place and time   ASSESSMENT AND PLAN

## 2011-03-28 NOTE — Consult Note (Signed)
NAME:  Victoria Gomez, Victoria Gomez                          ACCOUNT NO.:  [id number]   MEDICAL RECORD NO.:  [id number]                   PATIENT TYPE:  INP   LOCATION:  0457                                 FACILITY:  [facility]   PHYSICIAN:  Corwin Levins, M.D. [facility]             DATE OF BIRTH:  10-11-1925   DATE OF CONSULTATION:  12/01/2003  DATE OF DISCHARGE:                                   CONSULTATION   CHIEF COMPLAINT:  Asked to see per Dr. Thomasena Edis regarding questionable  irregular heart beat and multiple medical problems after a left ankle  fracture.   HISTORY:  Victoria Gomez is a 77 year old white female who unfortunately fell  down three steps after she tripped over her dog under her feet.  She was  seen in the emergency room with an obvious left ankle fracture.  This was  felt to be amenable to surgery on Monday, December 04, 2003.  Also, she  sprained her right ankle.  Until then, she keeps the ankles elevated.  She  was felt to have some possible irregular heart beats in the ER.  No ECG.  At that time, she said she had some vague palpitations, but it has all  resolved now after relaxing in the room just coming from the ER.  She never  had any chest pain, shortness of breath, syncope, or other symptoms.  There  was some nausea and vomiting in the ER last p.m. after she was given  morphine, but otherwise none.   PAST MEDICAL HISTORY:  1. History of coronary artery disease, severe single vessel RCA disease.     Status post RCA stent, and other minor abnormalities such as 25% LAD with     2% ostial lesion.  Ejection fraction 65% by catheterization in October     2004.  2. Hypothyroidism.  3. Hypertension.  4. Anemia with hemoglobin 10.9 in December 2003.  5. Hypercholesterolemia.  6. History of recurrent UTI with urinary incontinence.   PAST SURGICAL HISTORY:  1. Status post ___________.  2. Status post cholecystectomy.  3. History of cesarean section.   ALLERGIES:  1. SULFA.  2.  CODEINE.   CURRENT MEDICATIONS:  1. Aciphex 20 mg p.o. daily.  2. Ditropan XL 10 mg q.h.s.  3. Norvasc 5 mg p.o. daily.  4. Lipitor 10 mg p.o. daily.  5. Synthroid 0.05 mg p.o. daily.  6. Toprol XL 25 mg p.o. daily.  7. Cozaar 50 mg p.o. daily.  8. Some sort of fluid pill, I suspect either Lasix or Demodex.  9. Baby aspirin 81 mg p.o. daily.  10.      Imdur 120 mg p.o. daily.  11.      Nitroglycerin sublingual.  12.      Chlorocon 10 mEq p.o. b.i.d.   SOCIAL HISTORY:  She is married.  She lives in [place] with her husband.  No tobacco, no alcohol.  FAMILY HISTORY:  Mother with colon cancer, and the patient has had her  colonoscopy.   REVIEW OF SYSTEMS:  Otherwise noncontributory.   PHYSICAL EXAMINATION:  VITAL SIGNS:  Victoria Gomez is afebrile, 142/74,  respirations 20, pulse 80.  ENT:  Sclerae clear.  TMs clear.  Pharynx benign.  NECK:  Without lymphadenopathy, JVD, thyromegaly.  CHEST:  No rales or wheezing.  CARDIAC:  Regular rhythm, no murmur.  ABDOMEN:  Soft, nontender, positive bowel sounds, no organomegaly or masses.  EXTREMITIES:  With edematous ankles.  Otherwise, no peripheral edema.   LABORATORY DATA:  ECG shows sinus rhythm with sinus arrhythmia only and non-  specific ST-T wave changes.  INR of 1.  Sodium 139, potassium 3.4, chloride  108, bicarbonate 26, BUN 22, creatinine 1.1, glucose 146.  LFTs otherwise  within normal limits.  Hemoglobin 11.4, white blood cell count 8.   ASSESSMENT AND PLAN:  1. Minor palpitations.  Questionable premature ventricular complexes versus     transient atrial fibrillation versus other.  She is in sinus rhythm now.     No evidence of significant problems such as atrial fibrillation on the     monitor in the emergency room.  No apparent complications such as     congestive heart failure or chest pain, etc.  It is felt that she should     be simply followed for now.  I doubt that she needs telemetry at this     time.  2.  Coronary artery disease with other medical problems including     hypertension, anemia, hypercholesterolemia, hypothyroidism, otherwise     stable.  Re-start home medications.  3. Hypokalemia.  Replace p.o.  Re-check in the a.m.  4. Hyperglycemia.  Likely stress related.  Minor.  We will check CBGs for 48     hours, give sliding scale insulin.  Otherwise, check hemoglobin A1C.  I     doubt she needs oral hypoglycemics at this time.  5. Orthopaedics.  Ortho to follow.  We will apply Lovenox 40 mg     subcutaneously b.i.d. for prevention therapy at this time.                                               Corwin Levins, M.D. [facility]    JWJ/MEDQ  D:  12/01/2003  T:  12/02/2003  Job:  [id number]   cc:   Victoria Gomez, M.D. [facility]   Victoria Gomez, M.D.

## 2011-03-28 NOTE — Discharge Summary (Signed)
[place]  Patient:    Victoria Gomez                        MRN: [id number] Adm. Date:  [id number] Disc. Date: [id number] Attending:  Rollene Rotunda Dictator:   Tereso Newcomer, P.A. CC:         Rollene Rotunda, M.D. [facility]             Stacie Glaze, M.D. [facility]                  Referring Physician Discharge [place]  DATE OF BIRTH:  08/14/1925  REASON FOR ADMISSION:  Unstable angina.  DISCHARGE DIAGNOSES: 1. Coronary artery disease. 2. Hypertension. 3. Urinary incontinence. 4. Status post hysterectomy. 5. Status post cholecystectomy. 6. Frequent urinary tract infections.  PROCEDURES PERFORMED THIS ADMISSION: 1. Cardiac catheterization on June 05, 2000 revealing normal left main, LAD    with proximal stenosis of 30%, mid left 20%.  First diagonal large with    50% stenosis.  Left circumflex with stenosis mid vessel of 60-70% after    large obtuse marginal.  Obtuse marginal #1 with 30% and then 40%.  RCA    ostial 80%, proximal 30%, mid 40%.  Left ventriculogram with normal wall    motion and an EF of greater than 60%. 2. Percutaneous coronary intervention on June 08, 2000 - stent to the ostium    of the RCA with reduction of stenosis of 80% to 0.  ADMISSION HISTORY:  This 77 year old female with history of hypertension and family history of coronary disease had history of cardiac catheterization by Dr. Delfin Edis approximately 10 years prior.  At that time, she had beginning "two blockages" that were treated medically.  Since that time, she had done well.  She had a nonischemic Cardiolite in September 1997. Approximately nine days prior to admission, while working with her computer, she had an episode of severe, diffuse substernal chest pain which radiated across her chest.  She thought it was indigestion and had tried some Tums without relief.  She vomited and her husband gave her a nitroglycerin, which did help her symptoms.   She laid down but the symptoms recurred, and she took a second nitroglycerin.  She had no further symptoms until four days prior, while transplanting flowers, she had recurrent symptoms.  She did not require nitroglycerin at that time, but rested and her symptoms relieved.  She had no further episodes of chest pain until initial evaluation.  She saw Dr. 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  She was therefore referred to our office for further evaluation.  INITIAL PHYSICAL EXAMINATION:  GENERAL:  Revealed a pleasant female.  VITAL SIGNS:  Blood pressure 148/84.  NECK:  Without bruits or JVD.  LUNGS:  Clear to auscultation and percussion bilaterally.  HEART:  Sinus rhythm, S1 and S2 normal, 2/6 systolic ejection murmur, no S3.  ABDOMEN:  Soft without mass, hepatosplenomegaly, bruits, or tenderness.  EXTREMITIES:  Active pedal pulses with no edema.  HOSPITAL COURSE:  Given the patients history of coronary artery disease, abnormal EKG, and her recent symptoms, it was felt that she should undergo coronary angiography.  She was immediately sent to [facility] and admitted.  She went for cardiac catheterization later that day.  The results are noted above.  Due an  emergent case, the patient was sent back to a telemetry bed to await percutaneous coronary intervention after the weekend. She remained stable throughout the weekend.  She had no further episodes of chest pain.  She did complain of some dysuria on June 06, 2000.  She reported taking fluoroquinolone as needed for UTIs.  A urinalysis was checked and this revealed few bacteria and positive leukocyte esterase.  She was treated appropriately.  Initial laboratory revealed WBC 6000; hemoglobin 13.3; hematocrit 37.4; platelet count 236,000.  INR 1.1.  Sodium 139, potassium 3.2, chloride 104, CO2 27, glucose 101, BUN 16, creatinine  0.8, calcium 9.5.  Total CK 39, CK-MB 1.0.  Lipid profile revealed total cholesterol 182, triglycerides 118, HDL 61, LDL 97.  She was given 40 mEq of potassium and her potassium was 3.7 on June 07, 2000. She went back to the cardiac catheterization laboratory on June 08, 2000 for percutaneous coronary intervention.  She underwent successful stenting of the ostial RCA lesion.  She had no immediate complications.  Her groin remained stable.  On the morning of June 09, 2000, after careful ambulation, she was felt to be stable enough for discharge to home.  DISCHARGE MEDICATIONS:  1. Plavix 75 mg q.d. for four weeks.  2. Premarin 0.625 mg p.o. q.d.  3. Synthroid 0.05 mg p.o. q.d.  4. Hyzaar 50/12.5 p.o. q.d.  5. Norvasc 5 mg p.o. q.d.  6. Toprol XL 100 mg p.o. q.d.  7. Nitrostat 0.4 mg p.r.n. chest pain.  8. Coated aspirin 81 mg p.o. q.d.  9. Detrol LA 4 mg p.o. q.d. 10. Noroxin 400 mg p.o. b.i.d. p.r.n. UTI.  ACTIVITY:  No driving, heavy lifting, or exertion for three days.  DIET:  Low fat/low cholesterol/low sodium diet.  WOUND CARE:  She is to watch her groin for any increased swelling, bleeding, or bruising and call our office with concerns.  SPECIAL INSTRUCTIONS:  She has been advised to stop using the nitroglycerin patch.  FOLLOW-UP:  She is to follow up with Dian Queen, the physician assistant for Dr. Antoine Poche in [place], in two weeks on Monday, June 22, 2000 at 8:30 a.m.  After seeing Mr. Rodena Goldmann, she is to follow up with Dr. Lovell Sheehan in the future, and she should call him for an appointment. DD:  06/09/00 TD:  06/09/00 Job: [id number]

## 2011-03-28 NOTE — Cardiovascular Report (Signed)
   NAME:  Victoria Gomez, Victoria Gomez                          ACCOUNT NO.:  [id number]   MEDICAL RECORD NO.:  [id number]                   PATIENT TYPE:  OIB   LOCATION:  2899                                 FACILITY:  [facility]   PHYSICIAN:  Rollene Rotunda, M.D.                DATE OF BIRTH:  November 28, 1924   DATE OF PROCEDURE:  DATE OF DISCHARGE:                              CARDIAC CATHETERIZATION   PRIMARY PHYSICIAN:  Dr. Darryll Capers.   PROCEDURE:  Left-heart catheterization/ coronary arteriography.   INDICATION:  A patient with unstable angina and previous stenting of her  right coronary artery.  She has also had brachytherapy on in-stent  restenosis.   PROCEDURE:  Left-heart catheterization was performed via the right femoral  artery.  The artery was cannulated using anterior wall puncture.  A number 6-  French arterial sheath was inserted via the modified Seldinger technique.  Preformed Judkins and a pigtail catheter were utilized.  The patient  tolerated the procedure well and left the lab in stable condition.  An Angio-  Seal device was deployed to achieve vascular hemostasis at the end of the  case.   RESULTS:   HEMODYNAMICS:  LV 149/20, AO 149/63.  CORONARIES:  The left main was normal.  The LAD had 2% ostial stenosis.  There was mid 25% stenosis.  The first diagonal was large with branching,  luminal irregularities.  There are two septal perforators which were large,  with luminal irregularities.  These fed collaterals to the right coronary  artery.  The circumflex in the AV grove was normal.  There was an OM1 which  was large and branching with diffuse luminal irregularities.  The right  coronary artery was a dominant vessel.  There was an ostial stent.  It was  occluded at the ostium.  There was scant bridging collateralization.  There  was septal perforator to right coronary artery distal collateralization.   LEFT VENTRICULOGRAM:  The left ventriculogram is seen in the RAO  projection.  Ejection fraction was 65% with normal wall motion.   CONCLUSION:  1. High-grade, single-vessel coronary artery disease.  2. Well-preserved ejection fraction.   PLAN:  The patient will continue on medical management and secondary risk  reduction.                                               Rollene Rotunda, M.D.    JH/MEDQ  D:  09/08/2003  T:  09/08/2003  Job:  [id number]

## 2011-03-28 NOTE — Cardiovascular Report (Signed)
[place]  Patient:    Victoria Gomez, Victoria Gomez                       MRN: [id number] Proc. Date: 06/05/00 Adm. Date:  [id number] Attending:  Rollene Rotunda CC:         Stacie Glaze, M.D. [facility]             Rollene Rotunda, M.D. [facility]             Cardiac Catheterization Laboratory                        Cardiac Catheterization  PROCEDURES PERFORMED:  Left heart catheterization with coronary angiography and left ventriculography.  INDICATIONS:  [name] is a 77 year old woman who has had two episodes of prolonged chest pain in the past week.  An ECG in the office today showed Q waves in the inferior and anterior leads.  She was referred for urgent catheterization.  DESCRIPTION OF PROCEDURE:  A 6 French sheath was placed in the right femoral artery.  Standard Judkins 6 French catheters were utilized.  Contrast was Omnipaque.  There were no complications.  CATHETERIZATION RESULTS:  HEMODYNAMICS:  Left ventricular pressure 175/13.  Aortic pressure 178/75. There was no aortic valve gradient.  LEFT VENTRICULOGRAM:  Wall motion is normal.  Ejection fraction estimated at greater than 60%.  CORONARY ARTERIOGRAPHY:  (Right dominant).  Left main:  Left main has minor luminal irregularities.  Left anterior descending:  The LAD has a 30% stenosis in the proximal vessel. The mid vessel has minor luminal irregularities.  There is a small septal perforator arising from the proximal LAD which has a 95% stenosis at its origin.  There is a large first diagonal branch which has a 50% stenosis at its origin and a 30% stenosis in a branch.  There is a small second diagonal.  Left circumflex:  The left circumflex gives rise to a large branching OM-1 and a small OM-2.  There is a 60% stenosis in the circumflex just after OM-1. OM-1 itself has a diffuse 30% stenosis followed by a discrete 40% stenosis.  Right coronary artery:  The right coronary artery is a dominant  vessel.  There is an 80% stenosis at the ostium of the right coronary artery with damping of pressure wave form with catheter engagement.  This did not change with intracoronary nitroglycerin.  The proximal vessel has a diffuse 30% stenosis from the mid vessel 40% stenosis.  The distal right coronary artery gives rise to a normal sized posterior descending artery, a small posterolateral branch.  IMPRESSION: 1. Normal left ventricular systolic function. 2. One-vessel coronary artery disease characterized by an 80% stenosis in the    ostium of the right coronary artery.  PLAN:  Cine will be reviewed.  At this point anticipate with elective percutaneous intervention of the ostial right coronary artery. DD:  06/05/00 TD:  06/06/00 Job: [id number]

## 2011-03-28 NOTE — Cardiovascular Report (Signed)
NAME:  Victoria Gomez, Victoria Gomez                          ACCOUNT NO.:  [id number]   MEDICAL RECORD NO.:  [id number]                   PATIENT TYPE:  OIB   LOCATION:  2875                                 FACILITY:  [facility]   PHYSICIAN:  Charlies Constable, M.D. [facility]              DATE OF BIRTH:  11-14-1924   DATE OF PROCEDURE:  11/01/2002  DATE OF DISCHARGE:                              CARDIAC CATHETERIZATION   PROCEDURE PERFORMED:  1. Cutting Balloon angioplasty of the right coronary artery.  2. Brachytherapy of the right coronary artery.   CLINICAL HISTORY:  The patient is 77 years old and has documented coronary  disease and had undergone stenting of the ostium of the right coronary  artery in July 2001.  She was recently hospitalized in [place] with chest  pain and underwent catheterization and was found to have 80% stenosis within  the stent in the ostium of the right coronary artery.  She also had 75-80%  stenosis in a diagonal branch to the LAD and 50% stenosis in a marginal  branch of the circumflex artery.  She elected to come back here for  treatment and was seen by Dr. Antoine Poche, who arranged for her to come in for  intervention and brachytherapy on the right coronary artery.   DESCRIPTION OF PROCEDURE:  The procedure was performed via the right femoral  artery using an arterial sheath and a 7 [place] JR4 guiding catheter with  side holes.  We used a short BMW and crossed the lesion with the wire  without difficulty.  We dilated with a 3.5 x 15 mm Cutting Balloon  performing three inflations up to 8 atmospheres for 30 seconds.  We then  preformed brachytherapy using a 3.5 x 72 mm stenting catheter. We Dr.  Rennie Plowman assistance, we delivered 20 Gy to the affected area.  Dwell time  was about 5 minutes due to a older source. She did not have any ischemia  during the dwell time. We then did touch up Cutting Balloon angioplasty  following the brachytherapy with the 3.5 x 15 mm Cutting Balloon  performing  one inflation of 6 atmospheres for 30 seconds.  Repeat diagnostic studies  were then performed through the guiding catheter.  The patient tolerated the  procedure well and left the laboratory in satisfactory condition.   RESULTS:  Initially the stenosis was located within the stent at the ostium  of the right coronary artery and was estimated at 80%.  The stent was a 3.5  x 13 mm new [place].  Following Cutting Balloon angioplasty and  brachytherapy, the stenosis improved from 80% to 10%.   CONCLUSIONS:  Successful Cutting Balloon angioplasty and brachytherapy for  in-stent re-stenosis at the ostium of the right coronary artery with  improvement in percent diameter narrowing from 80% to 10%.   DISPOSITION:  The patient was returned to the postangioplasty unit for  further observation.  Charlies Constable, M.D. [facility]    BB/MEDQ  D:  11/01/2002  T:  11/02/2002  Job:  [id number]   cc:   Stacie Glaze, M.D. [facility]   Rollene Rotunda, M.D. [facility]  [address]  [place]  [place] [address]  Fax: 1   Cardiopulmonary Laboratory

## 2011-03-28 NOTE — Consult Note (Signed)
NAME:  Victoria Gomez, Victoria Gomez NO.:  [id number]   MEDICAL RECORD NO.:  [id number]                   PATIENT TYPE:  INP   LOCATION:  0356                                 FACILITY[facility]  [facility]   PHYSICIAN:  Carole Binning, M.D. [facility]         DATE OF BIRTH:  17-Nov-1924   DATE OF CONSULTATION:  DATE OF DISCHARGE:                                   CONSULTATION   PRIMARY CARDIOLOGIST:  Rollene Rotunda, M.D.   PRIMARY CARE PHYSICIAN:  Dr. Lovell Sheehan.   REASON FOR EVALUATION:  Preoperative evaluation.   HISTORY OF PRESENT ILLNESS:  Victoria Gomez is a very-pleasant 77 year old  female patient of Dr. Rollene Rotunda with a history of coronary artery  disease, status post PCI of the RCA in 2001 with repeat PCI of the RCA  secondary to in-stent restenosis in December of 2003, and last  catheterization in October of 2004, revealing occluded RCA stent with distal  collateralization from septal perforator.  Unfortunately, she fell and broke  her left ankle when she tripped over her dog Thursday of last week.  She was  eventually admitted to [facility] on Friday.   PLAN:  ORIF on Monday, January 24.  Generally, the patient has been  complaining of some chest discomfort.  We are asked to see her  preoperatively.  Victoria Gomez has occasional chest discomfort that is  relieved by nitroglycerin.  Prior to admission, she had last episode of  chest discomfort about two weeks ago.  Last night, she did have another  episode of chest discomfort located in the center of her chest that she  describes as hurt.  One nitroglycerin made this pain go away.  There was  no radiating symptoms.  There was no associated diaphoresis, nausea or  shortness of breath.  The patient does note that prior to her injury at  home, she was getting short of breath with over-exertion.  She denies any  exertional chest discomfort at home.  She denies any orthopnea or paroxysmal  nocturnal dyspnea or syncope.   She does have lower-extremity edema that she  attributes to Norvasc.  Apparently prior to admission at the orthopedists  office, an abnormal rhythm was noted.  In the chart, there is an EKG with  some PVC's.  So far, there is no record of any arrhythmia in the chart.   PAST MEDICAL HISTORY:  1. Coronary artery disease as noted above with stenting to the RCA in 2001     and PTCA to RCA secondary to in-stent restenosis in December of 2003.     Catheterization September 08, 2003, reveals a normal left main, LAD 20%     ostial stenosis, and a mid 25% stenosis.  The first diagonal had luminal     irregularities.  Two large septal perforators with luminal     irregularities.  Circumflex was okay.  The first obtuse marginal had  diffuse, luminal irregularities.  As noted previously, the RCA stent was     totally occluded at the obtuse, with septal to distal RCA     collateralization.  2. She has a history of hypertension.  3. Hypothyroidism which was treated.  4. Anemia treated.  5. Dyslipidemia.  6. She is status post cholecystectomy.   ALLERGIES:  SULFA AND CODEINE.   MEDICATIONS AT HOME:  1. Aciphex 20 mg daily.  2. Ditropan XL 10 mg q.h.s.  3. Norvasc 5 mg daily.  4. Lipitor 10 mg q.h.s.  5. Synthroid 0.05 mg daily.  6. Toprol XL 25 mg daily.  7. Cozaar 50 mg daily.  8. Fluid pill.  9. Aspirin 81 mg daily.  10.      Imdur 120 mg daily.  11.      Nitroglycerin p.r.n.  12.      Klor-Con 10 mEq b.i.d.   SOCIAL HISTORY:  The patient lives in [place] with her husband.  She  denies any tobacco or alcohol abuse.   FAMILY HISTORY:  Positive for coronary artery disease in several relatives.   REVIEW OF SYSTEMS:  She denies any fevers, chills, sweats, headache, sore  throat, rash, orthopnea, palpitations, syncope, dysuria, hematuria,  numbness, myalgias, arthralgias, nausea, vomiting, diarrhea, blood per  rectum or melena or skin changes.  As noted above, she has had some chest   discomfort, shortness of breath and dyspnea on exertion, edema, presyncope.  The rest of review of systems is negative.   PHYSICAL EXAMINATION:  GENERAL:  She is well-nourished, well-developed, in  no acute distress.  VITAL SIGNS:  Blood pressure 114/56, temperature 99.5, pulse 60,  respirations 20, oxygen saturations 89% on room air.  HEENT:  Normocephalic, atraumatic.  NECK:  Without JVD.  ENDOCRINE:  Without thyromegaly.  LYMPH:  Without lymphadenopathy.  CARDIAC:  Normal S1 and S2, regular rate and rhythm with occasional  premature beat.  LUNGS:  Clear to auscultation bilaterally.  VASCULAR:  Exam without carotid bruits bilateral.  SKIN:  Without rashes.  ABDOMEN:  Soft, nontender with normoactive bowel sounds.  EXTREMITIES:  No edema noted in bilateral thighs.  MUSCULOSKELETAL:  The patient has a soft cast to her left lower extremity  and a splint to her right lower extremity.  NEUROLOGIC:  Alert and oriented x3, nonfocal.   STUDIES:  Electrocardiogram reveals sinus rhythm with a heart rate of 78,  left axis deviation, inferior and anterolateral Q waves, and no acute  changes with occasional PVC's.   LABORATORY DATA:  Reveals hemoglobin 10.5, hematocrit 31.7, platelet count  214,000.  White count 6900, sodium 137, potassium 3.3, chloride 103, CO2 29,  BUN 19, creatinine 1.2, glucose 121.  Total bilirubin 1.1, AST 23, ALT 16.  Alkaline phosphatase 80.  Total protein 7.1, albumin 3.8.  INR 1.0.  Calcium  8.9.   IMPRESSION:  1. Status post left ankle fracture.     A. Needs open reduction, internal fixation, planned for December 04, 2003.  2. Chest discomfort.  Probable stable angina.  3. Coronary artery disease.     A. History of totally-occluded ostial RCA stent with distal        collateralization and minimal nonobstructive disease elsewhere.    B. Preserved left-ventricular function with an ejection fraction of 55%.     C. Hypertension.     D. Treated hypothyroidism.      E. Treated dyslipidemia.     F. Anemia.     G. Hypokalemia.  PLAN:  The patient seen by Dr. Emilie Rutter. Pulsipher.  He formulated the  following plan:  1. We plan to check cardiac enzymes and another electrocardiogram.  If the     cardiac enzymes are negative for myocardial infarction and the patient     remains pain-free, then she will require no further cardiac workup prior     to her noncardiac surgery.  She will be at increased but acceptable risk.     It is imperative that beta-blockers are continued throughout the     perioperative period to reduce the risk of perioperative myocardial     infarction.  2. Potassium will be repleted.  3. Also at this time, a chest CT is pending to rule out pulmonary embolism.     This is per the Internal Medicine consultation service.  4. We will definitely be available in the perioperative period as needed.     Tereso Newcomer, P.A.                        Carole Binning, M.D. [facility]    SW/MEDQ  D:  12/02/2003  T:  12/02/2003  Job:  [id number]

## 2011-03-28 NOTE — Discharge Summary (Signed)
NAME:  Victoria Gomez, Victoria Gomez                          ACCOUNT NO.:  [id number]   MEDICAL RECORD NO.:  [id number]                   PATIENT TYPE:  OIB   LOCATION:  6533                                 FACILITY:  [facility]   PHYSICIAN:  Rollene Rotunda, M.D. [facility]            DATE OF BIRTH:  04-17-1925   DATE OF ADMISSION:  11/01/2002  DATE OF DISCHARGE:  11/02/2002                                 DISCHARGE SUMMARY   BRIEF HISTORY:  This is a 77 year old female with a recent history outlined  in a note on 10/10/02, which is not available at this time.  In short, she  was in [facility] where she was admitted for chest pain and she  ruled out for an MI, a Cardiolite showed apical ischemia and she had a  cardiac catheterization which suggested that she might need brachytherapy to  the right coronary artery end stent restenosis.  The patient saw Dian Queen, P.A. and Willa Rough, M.D. at the beginning of the month.  She was  managed conservatively at that time by adjusting her medications during  which time her catheterization films were being obtained from [facility].  The patient was seen back in the office on 10/20/02, by Dr. Rollene Rotunda,  and arrangements were made to admit the patient to [facility] for  cardiac catheterization.   PAST MEDICAL HISTORY:  The patient does have a history of coronary artery  disease, as noted she had a recent cardiac catheterization in [facility]  that showed a normal left main, 75-80% stenosis of the small diagonal  branch, 40-50% stenosis of the marginal branch, 65% stenosis of the proximal  RCA, which was previously stented.  This was performed by Dr. Juanda Chance in July  of 2001.  The patient had a negative Cardiolite in 3/02.   The patient had a Cesarean section in the past, as well as a hysterectomy,  cholecystectomy, she has a history of hypothyroidism and a history of  hypertension.   ALLERGIES:  CODEINE, SULFA.   HOSPITAL COURSE:   As noted, this patient was admitted to [facility]  for further evaluation and treatment of coronary artery disease as  documented above.  On 11/01/02, she underwent PTCA of the right coronary  artery end stent stenosis performed by Dr. Charlies Constable, reducing an 80%  lesion to approximately 10%.  The patient tolerated this well and  arrangements were made to discharge the patient the following day; there was  some question about a right groin possible pseudoaneurysm.  She had an  ultrasound performed that was negative for pseudoaneurysm.  Arrangements  were made to proceed with discharge with early followup.  Prior to discharge  she was noted to be hypokalemic, this was supplemented with arrangements to  be followed on an outpatient basis.   LABORATORY DATA:  On the day of discharge hemoglobin 10.9, hematocrit 32.1,  WBC  4.5, platelets 193,000.  Chemistries on the day of discharge revealed a  BUN of 15, creatinine 0.8, potassium 2.9, glucose 93.  PT/PTT were within  normal limits.  Cardiac enzymes were negative.   DISCHARGE MEDICATIONS:  1. Plavix 75 mg daily for one month.  2. Synthroid 50 mcg daily.  3. Norvasc 5 mg daily.  4. Aspirin 81 mg daily.  5. Hyzaar 100 mg daily.  6. Isosorbide 90 mg daily.  7. Klor-Con 10 mEq daily.  8. Lipitor 10 mg daily.  9. Ditropan as previously taken.  10.      Toprol XL 50 mg a.m. and 25 mg at bedtime.  11.      Tylenol as needed for pain.   DISCHARGE ACTIVITIES:  The patient was told to avoid any strenuous activity  or driving for two days.   DISCHARGE DIET:  She was to be on a low salt low fat diet.   DISCHARGE INSTRUCTIONS:  1. She was told to call the office if she had any increased pain, swelling     or bleeding from her groin.  2. She was to have a basic metabolic panel at the [place] office on     11/04/02, Friday, to be called to Dr. Rollene Rotunda.  3. She would see Dr. Antoine Poche in 2-3 weeks and  Dr. Lovell Sheehan as needed or  as     scheduled.   PROBLEM LIST AT DISCHARGE:  1. Coronary artery disease as outlined above with PTCA of end stent     restenosis this admission.  2. Ultrasound of the right groin to rule out pseudoaneurysm prior to     discharge this was negative.  3. Hypokalemia.  4. History of hypertension.  5. Status post multiple surgeries.  6. I do not see any information regarding the patient's ejection fraction at     this time.     Delton See, P.A. [facility]                  Rollene Rotunda, M.D. [facility]    DR/MEDQ  D:  11/02/2002  T:  11/03/2002  Job:  [id number]   cc:   Stacie Glaze, M.D. [facility]

## 2011-03-28 NOTE — Op Note (Signed)
NAME:  Victoria Gomez, Victoria Gomez                          ACCOUNT NO.:  [id number]   MEDICAL RECORD NO.:  [id number]                   PATIENT TYPE:  INP   LOCATION:  0356                                 FACILITY:  [facility]   PHYSICIAN:  Erasmo Leventhal, M.D.         DATE OF BIRTH:  03-19-1925   DATE OF PROCEDURE:  12/04/2003  DATE OF DISCHARGE:                                 OPERATIVE REPORT   PREOPERATIVE DIAGNOSIS:  Left ankle displaced unstable trimalleolar ankle  fracture.   POSTOPERATIVE DIAGNOSIS:  Left ankle displaced unstable trimalleolar ankle  fracture.   PROCEDURE:  Left ankle open reduction and internal fixation, intraoperative  use of C-arm radiography.   SURGEON:  Erasmo Leventhal, M.D.   ASSISTANT:  Jaquelyn Bitter. Chabon, P.A.   ANESTHESIA:  Spinal.   ESTIMATED BLOOD LOSS:  Less than 20 mL.   DRAINS:  None.   COMPLICATIONS:  None.   TOURNIQUET TIME:  1 hour and 5 minutes at 350 mmHg.   DISPOSITION:  To PACU stable.   DESCRIPTION OF PROCEDURE:  The patient had undergone all preoperative  internal medicine and cardiac clearance for surgery. She was taken to the  operating room, placed in supine position.  Rocephin had been given over the  weekend.  We gave her another gram of Ancef intravenously. Spinal anesthetic  was administered. Following ___________ elevator, splint was removed, she  was elevated, she was prepped with duraprep and awl draped in a sterile  fashion. The skin edges were palpated and I did feel that the swelling had  sufficiently decreased to proceed with surgery.  Exsanguinated with an  esmarch, tourniquet was inflated to 350 mmHg.   A straight lateral incision was made through the skin and subcutaneous  tissue as well as the fibula. Dissection of the skin and subcutaneous  tissue, cutaneous veins and nerves were retracted out of the way.  Subperiosteal dissection was undertaken on the distal fibula, she had a  complex comminuted Weber type  B distal fibular fracture. The fracture was  opened, the joint was irrigated, fracture was reduced anatomically and held  securely with an interfrag screw placed from anterior to posterior.   A six hole 1/3 tubular plate was then applied on the posterior aspect of the  fibula and a neutralization formed.  3.5 screws were placed proximally and  then 4.0 cancellous distally.  It gave a nice stable construct to the  lateral malleolus.   X-rays revealed excellent reduction of the lateral malleolus and placement  of implants.   Attention directed to the medial side and there was an oblique incision made  over the skin overlying the medial malleolus, dissecting the skin only. At  this point in time, the saphenous vein and nerve were retracted out of the  way.  Fresh hematoma was encountered and irrigated out of the way. There was  soft tissue flap in the fracture site, it was opened  and the edge of the  fracture was exposed.  This was found to be commuted on the medial side with  some plastic deformation of the bone.  It was reduced. The joint was  irrigated. The fracture was reduced as anatomically as possible and securely  fixed internally with an AO 4.0 cancellous screw placed across the fracture  site in interfragmentary manner with the soft tissue washer.  This gave  excellent reduction. At this point in time, we checked on AP, lateral, and  mortis planes and we found we had excellent reduction of the fractures. We  placed the implants and the posterior malleolus was in satisfactory  position.  It had been reduced indirectly with reduction of the lateral  malleolus and did not require internal fixation.   Both medial and lateral sides were irrigated several times.  The medial side  and the subcu closed with Vicryl, skin closed with nylon on the lateral  side.  Periosteum closed with Vicryl, subcu Vicryl, skin closed with  staples.  For anesthesia 10 mL of 0.25% Marcaine was placed into  the  subcutaneous tissue and skin edges making sure we avoided the major veins  and we did.  A sterile compressive dressing applied to the foot and ankle.  Tourniquet was deflated and she had normal circulation of the foot and ankle  at the end of the case. There were no complications post plaster splints.  Taken from the operating room to PACU in stable condition.   To decrease surgical time throughout the entire procedure, Mr. Jeannett Senior  Chabon's assistance was needed.                                               Erasmo Leventhal, M.D.    RAC/MEDQ  D:  12/04/2003  T:  12/05/2003  Job:  [id number]

## 2011-03-28 NOTE — Procedures (Signed)
[place]  Patient:    Victoria Gomez                        MRN: [id number] Proc. Date: 06/08/00 Adm. Date:  [id number] Attending:  Rollene Rotunda CC:         Stacie Glaze, M.D. [facility]             Bruce R. Juanda Chance, M.D. [facility]             Daisey Must, M.D. [facility]                           Procedure Report  PROCEDURE: Stent implantation.  CARDIOLOGISTEverardo Beals Juanda Chance, M.D.  INDICATIONS FOR PROCEDURE: Victoria Gomez is a 77 year old and has hypertension and positive family history of coronary disease, and presented with a history of chest pain for about nine days.  Her ECG suggested possible old diaphragmatic and anteroseptal infarction and she was thought to possibly have had an out-of-hospital infarction.  She was studied last week by Dr. Gerri Spore and found to have moderate disease in the left coronary system and 80% ostial stenosis of the right coronary artery, and normal LV function.  Intervention had to be postponed due to another emergency.  DESCRIPTION OF PROCEDURE: The patient was performed by right femoral artery and arterial sheath and a 7 [place] JR4 guiding catheter with side holes.  We used a short floppy wire and crossed the lesion in the proximal right coronary artery without any ostium of the right coronary artery without difficulty. The patient had been given weight-adjusted heparin and upon ACD gradient of 200 seconds we actually achieved an ACT of greater than 300 seconds.  She was given a double bolus of Integrilin infusion.  We predilated with a 3.25 Mavik performing window inflation of 6 atmospheres, +50 seconds.  We then positioned a 3.5 12 mm. Nir-Royal stent.  We took a great deal of time to position the stent at the precise location to cover the ostium without being into the aorta and deployed this with one inflation of 14 atmospheres for 50 seconds.  Repeat diagnostic studies were then performed through the guiding catheter.   The patient tolerated the procedure well and left the laboratory in satisfactory condition.  RESULTS: Initially the stenosis of the ostium of the right coronary artery was estimated at 70-80%.  Following stenting this improved to 0%.  There was a step-down at the distal edge of the stent with some slight lucency but we took multiple views and did not see any edge tear.  CONCLUSION: Successful stenting of the ostium of the right coronary artery with improvement in narrowing from 80% to 0%.  DISPOSITION: The patient is returned to the [facility] for further observation. DD:  06/08/00 TD:  06/08/00 Job: [id number]

## 2011-03-28 NOTE — Discharge Summary (Signed)
NAME:  Victoria Gomez, Victoria Gomez                          ACCOUNT NO.:  [id number]   MEDICAL RECORD NO.:  [id number]                   PATIENT TYPE:  INP   LOCATION:  0470                                 FACILITY:  [facility]   PHYSICIAN:  Erasmo Leventhal, M.D.         DATE OF BIRTH:  Jan 19, 1925   DATE OF ADMISSION:  12/01/2003  DATE OF DISCHARGE:  12/11/2003                                 DISCHARGE SUMMARY   ADMISSION DIAGNOSIS:  Trimalleolar ankle fracture.   DISCHARGE DIAGNOSIS:  Trimalleolar ankle fracture.   OPERATION:  Open reduction, internal fixation ankle fracture.   BRIEF HISTORY:  A 77 year old lady with significant cardiac and medical  history who fell the evening of admission with a trimalleolar ankle  fracture. The patient was seen in the emergency room and obtained cardiac  and medical clearance prior to surgical intervention for her ankle fracture  and subsequently open reduction, internal fixation of her ankle fracture.   LABORATORY VALUES:  Admission CBC, hemoglobin and hematocrit low at 11.4 and  33.4. Admission CMET, potassium slightly low at 3.4, glucose elevated at  146.  Hemoglobin A1C at 5.5. CK and CK-MB through admission within normal  limits. Lipid profile showed total cholesterol of 115, triglycerides 79,  with HDL 47, VLDL 16, LDL 52.  Anemia studies were normal. TSH was normal.  Blood culture showed no growth.   COURSE IN THE HOSPITAL:  The patient was cleared by medical service and  underwent her surgery. The first postoperative day, vital signs were stable,  she was afebrile, and lungs were with bibasilar rales. Heart sounds were  normal and regular. Bowel sounds were active. No calf tenderness.  Neurovascular status was grossly intact in the foot. Chest x-ray and chest  CT were obtained by medical service and were negative for PE, but positive  for atelectasis. I&Os were good.  The patient on her first postoperative day  had chest pain. Her troponin levels  were normal. She was noted to have an  irregular heart rate, on PVCs. The previous PM she was moved to a monitored  floor and was followed by medical service for this. Her cardiac enzymes did  not rise and her troponin was normal. The second postoperative day, the  patient was feeling pretty good. Vital signs were stable, she afebrile.  Hemoglobin and hematocrit 8.9 and 27.7. Neurovascular status was intact in  the leg and there was no calf tenderness. She was awaiting a rehab consult  and PT/OT.  The patient did have a fever and this was felt secondary to drug  medications by ID consult and this was observed.  On the third postoperative  day, vital signs remained stable, her fever was gone, I&Os were good.  Hemoglobin and hematocrit were starting on the up side with normal WBC.  Neurovascular status was grossly intact in the feet and the patient  ambulated 40 feet with the assistance of PT. It was  felt that the patient  would benefit from rehab stay and consult was obtained. On December 09, 2003,  she continued to do well, her vital signs were stable, she was afebrile, she  was having no problems, and was learning to ambulate well with her walker.  On the sixth postoperative day she continued to do well. Temperature was to  a maximum of 99.4, vital signs were stable. She had no further problems with  cardiac or medical and due to her continued improvement in therapy and  ability to ambulate, she was not felt to be a rehab candidate and  subsequently discharge planning was made for home. On the 7th postoperative  day, the patient was feeling good, much better, vital signs were stable, and  she was afebrile. No chest pain, shortness of breath. Calves were negative.  Neurovascular status grossly intact in the feet. The patient had discharged  from the medical service and subsequently discharged home for follow-up in  our office.   CONDITION ON DISCHARGE:  Improved.   DISCHARGE  MEDICATIONS:  1. Percocet 5/325 mg one q.6h. p.r.n. pain.  2. Robaxin 500 mg one p.o. q.8h. p.r.n. spasm.  3. Aspirin one daily for three weeks as a DVT prophylaxis.   DISCHARGE INSTRUCTIONS:  She is to be nonweightbearing, use her walker,  elevate and ice, and call the office for recheck in ten days or call sooner  p.r.n. problems.     Jaquelyn Bitter. Chabon, P.A.                   Erasmo Leventhal, M.D.    SJC/MEDQ  D:  01/17/2004  T:  01/17/2004  Job:  [id number]

## 2011-05-01 ENCOUNTER — Telehealth: Payer: Self-pay | Admitting: Internal Medicine

## 2011-05-01 NOTE — Telephone Encounter (Signed)
Pt is in [place]. Pt says that she is gaining weight, due to Exforge. Pt is on fluid pill, fursosemide, but it is not removing excess fluid. Pt req alternative med. Pls call diff med into [facility].

## 2011-05-02 ENCOUNTER — Other Ambulatory Visit: Payer: Self-pay | Admitting: *Deleted

## 2011-05-02 NOTE — Telephone Encounter (Signed)
Labs first then ov- doesn't want her to stop exforge-pt informed and appointment made

## 2011-05-05 ENCOUNTER — Other Ambulatory Visit (INDEPENDENT_AMBULATORY_CARE_PROVIDER_SITE_OTHER): Payer: Medicare Other

## 2011-05-05 DIAGNOSIS — I10 Essential (primary) hypertension: Secondary | ICD-10-CM

## 2011-05-05 DIAGNOSIS — T887XXA Unspecified adverse effect of drug or medicament, initial encounter: Secondary | ICD-10-CM

## 2011-05-05 LAB — HEPATIC FUNCTION PANEL
ALT: 16 U/L (ref 0–35)
AST: 21 U/L (ref 0–37)
Albumin: 3.9 g/dL (ref 3.5–5.2)
Alkaline Phosphatase: 93 U/L (ref 39–117)
Bilirubin, Direct: 0.1 mg/dL (ref 0.0–0.3)
Total Protein: 6.6 g/dL (ref 6.0–8.3)

## 2011-05-05 LAB — BASIC METABOLIC PANEL
CO2: 26 mEq/L (ref 19–32)
Calcium: 8.8 mg/dL (ref 8.4–10.5)
Chloride: 109 mEq/L (ref 96–112)
Glucose, Bld: 115 mg/dL — ABNORMAL HIGH (ref 70–99)
Sodium: 143 mEq/L (ref 135–145)

## 2011-05-06 ENCOUNTER — Encounter: Payer: Self-pay | Admitting: Internal Medicine

## 2011-05-06 ENCOUNTER — Ambulatory Visit (INDEPENDENT_AMBULATORY_CARE_PROVIDER_SITE_OTHER): Payer: Medicare Other | Admitting: Internal Medicine

## 2011-05-06 VITALS — BP 130/80 | HR 60 | Temp 98.2°F | Resp 16 | Ht 63.0 in | Wt 170.0 lb

## 2011-05-06 DIAGNOSIS — Z8719 Personal history of other diseases of the digestive system: Secondary | ICD-10-CM

## 2011-05-06 DIAGNOSIS — E039 Hypothyroidism, unspecified: Secondary | ICD-10-CM

## 2011-05-06 DIAGNOSIS — E785 Hyperlipidemia, unspecified: Secondary | ICD-10-CM

## 2011-05-06 DIAGNOSIS — I10 Essential (primary) hypertension: Secondary | ICD-10-CM

## 2011-05-06 DIAGNOSIS — R5383 Other fatigue: Secondary | ICD-10-CM

## 2011-05-06 DIAGNOSIS — I2510 Atherosclerotic heart disease of native coronary artery without angina pectoris: Secondary | ICD-10-CM

## 2011-05-06 LAB — CBC WITH DIFFERENTIAL/PLATELET
Basophils Absolute: 0 10*3/uL (ref 0.0–0.1)
Eosinophils Absolute: 0.1 10*3/uL (ref 0.0–0.7)
Hemoglobin: 12.4 g/dL (ref 12.0–15.0)
Lymphocytes Relative: 25.4 % (ref 12.0–46.0)
MCHC: 33.4 g/dL (ref 30.0–36.0)
Monocytes Relative: 7.6 % (ref 3.0–12.0)
Neutrophils Relative %: 64.7 % (ref 43.0–77.0)
Platelets: 203 10*3/uL (ref 150.0–400.0)
RDW: 14.8 % — ABNORMAL HIGH (ref 11.5–14.6)

## 2011-05-06 LAB — TSH: TSH: 0.87 u[IU]/mL (ref 0.35–5.50)

## 2011-05-06 LAB — T4, FREE: Free T4: 0.96 ng/dL (ref 0.60–1.60)

## 2011-05-06 LAB — HEMOGLOBIN A1C: Hgb A1c MFr Bld: 6.8 % — ABNORMAL HIGH (ref 4.6–6.5)

## 2011-05-06 MED ORDER — FUROSEMIDE 40 MG PO TABS: 40.0000 mg | ORAL_TABLET | Freq: Every day | ORAL | Status: DC

## 2011-05-06 NOTE — Progress Notes (Signed)
Addended by: Stacie Glaze MD E on: 05/06/2011 10:07 AM   Modules accepted: Orders

## 2011-05-06 NOTE — Progress Notes (Signed)
Subjective:    Patient ID: Victoria Gomez, female    DOB: 1925-03-18, 77 y.o.   MRN: [id number]  HPI Chest pain is better she has a sense of extreme fatigue. Has a history of hypothyroidism but TSH and T4 free were normal in February of 2012 Blood pressure stable at 130/80. Reflux is controlled. She does have easy bruising. As reported weight gain.... of which have significant portion may be edema she reports bloating A review of renal function shows an increased creatinine from a baseline of 1.0-1.2 liver functions are normal BUN is stable She has gained 4-5 lbs.     Review of Systems  Constitutional: Negative for activity change, appetite change and fatigue.  HENT: Negative for ear pain, congestion, neck pain, postnasal drip and sinus pressure.   Eyes: Negative for redness and visual disturbance.  Respiratory: Positive for wheezing. Negative for cough.   Gastrointestinal: Negative for abdominal pain and abdominal distention.  Genitourinary: Negative for dysuria, frequency and menstrual problem.  Musculoskeletal: Negative for myalgias, joint swelling and arthralgias.  Skin: Negative for rash and wound.  Neurological: Positive for weakness. Negative for dizziness and headaches.  Hematological: Negative for adenopathy. Does not bruise/bleed easily.  Psychiatric/Behavioral: Negative for sleep disturbance and decreased concentration.   Past Medical History  Diagnosis Date  . HYPERLIPIDEMIA 03/07/2008  . HYPERTENSION 08/03/2007  . CORONARY ARTERY DISEASE 08/03/2007  . Acute on chronic systolic heart failure 05/23/2009  . HYPOTHYROIDISM 08/03/2007  . Esophageal reflux 10/18/2007  . DIVERTICULOSIS, COLON 08/03/2007  . DISORDER, BLADDER NEC 08/03/2007  . BREAST MASS, BENIGN 05/23/2009  . Edema 02/19/2009  . BARRETT'S ESOPHAGUS, HX OF 08/03/2007  . Interstitial cystitis   . Arthritis   . Internal hemorrhoids   . Epistaxis   . Right hand fracture   . Diarrhea   . Bladder disorder    Past  Surgical History  Procedure Date  . Breast surgery     benign mass  . Cholecystectomy   . Cesarean section   . Appendectomy   . Cataract extraction, bilateral   . Tonsillectomy   . Abdominal hysterectomy   . Coronary stent placement   . Ankle reconstruction     Left  . Wrist reconstruction     Right    reports that she quit smoking about 41 years ago. She has never used smokeless tobacco. She reports that she does not drink alcohol or use illicit drugs. family history includes Colon cancer in her mother; Colon polyps in her sister; Coronary artery disease in her brother, father, mother, and sister; Diabetes type II in her son; and Heart disease in her father and mother. Allergies  Allergen Reactions  . Codeine   . Sulfonamide Derivatives        Objective:   Physical Exam  Constitutional: She is oriented to person, place, and time.       Weight gain noted  Eyes: Conjunctivae are normal. Pupils are equal, round, and reactive to light.  Neck: Neck supple.  Pulmonary/Chest: No respiratory distress. She has wheezes. She has no rales. She exhibits no tenderness.  Abdominal: She exhibits distension. There is no tenderness. There is no rebound and no guarding.  Musculoskeletal: She exhibits edema. She exhibits no tenderness.  Neurological: She is alert and oriented to person, place, and time.  Skin: Skin is warm and dry.  Blood pressure 130/80, pulse 60, temperature 98.2 F (36.8 C), resp. rate 16, height 5\' 3"  (1.6 m), weight 179 lb (81.194 kg).  Assessment & Plan:  Weight gain of 10 pounds, has been eating increased fruit, and has not been walking as much. To investigate causes of her fatigue we will do a CBC with differential and a hemoglobin A1c we have urged walking and a 5 pound weight loss looking at the bread carbohydrates and portion control. Her blood pressure is in excellent control and for cardiovascular standpoint she appears to be stable.  Her liver  functions were normal and physical examination she has trace edema in the lower extremities and no lung field examination consistent with heart failure.  I think the main issues today to address are progressive deconditioning needing to do 20 minute walks 4 times a week and monitoring for blood count repeat thyroid studies and a hemoglobin A1c

## 2011-05-08 ENCOUNTER — Ambulatory Visit
Admission: RE | Admit: 2011-05-08 | Discharge: 2011-05-08 | Disposition: A | Discharge status: Home / Self Care | Payer: Medicare Other | Source: Ambulatory Visit | Attending: Internal Medicine | Admitting: Internal Medicine

## 2011-05-08 DIAGNOSIS — Z1231 Encounter for screening mammogram for malignant neoplasm of breast: Secondary | ICD-10-CM

## 2011-06-23 ENCOUNTER — Other Ambulatory Visit: Payer: Self-pay | Admitting: *Deleted

## 2011-06-23 MED ORDER — POTASSIUM CHLORIDE 10 MEQ PO TBCR: 10.0000 meq | EXTENDED_RELEASE_TABLET | Freq: Every day | ORAL | Status: DC

## 2011-07-21 ENCOUNTER — Encounter: Payer: Self-pay | Admitting: Internal Medicine

## 2011-07-21 ENCOUNTER — Ambulatory Visit (INDEPENDENT_AMBULATORY_CARE_PROVIDER_SITE_OTHER): Payer: Medicare Other | Admitting: Internal Medicine

## 2011-07-21 ENCOUNTER — Ambulatory Visit (INDEPENDENT_AMBULATORY_CARE_PROVIDER_SITE_OTHER)
Admission: RE | Admit: 2011-07-21 | Discharge: 2011-07-21 | Disposition: A | Discharge status: Home / Self Care | Payer: Medicare Other | Source: Ambulatory Visit | Attending: Internal Medicine | Admitting: Internal Medicine

## 2011-07-21 VITALS — BP 140/80 | Temp 98.7°F | Wt 174.0 lb

## 2011-07-21 DIAGNOSIS — R0989 Other specified symptoms and signs involving the circulatory and respiratory systems: Secondary | ICD-10-CM

## 2011-07-21 DIAGNOSIS — I10 Essential (primary) hypertension: Secondary | ICD-10-CM

## 2011-07-21 DIAGNOSIS — R0600 Dyspnea, unspecified: Secondary | ICD-10-CM

## 2011-07-21 DIAGNOSIS — R0609 Other forms of dyspnea: Secondary | ICD-10-CM

## 2011-07-21 DIAGNOSIS — I509 Heart failure, unspecified: Secondary | ICD-10-CM

## 2011-07-21 DIAGNOSIS — E039 Hypothyroidism, unspecified: Secondary | ICD-10-CM

## 2011-07-21 DIAGNOSIS — E785 Hyperlipidemia, unspecified: Secondary | ICD-10-CM

## 2011-07-21 DIAGNOSIS — K219 Gastro-esophageal reflux disease without esophagitis: Secondary | ICD-10-CM

## 2011-07-21 LAB — CBC WITH DIFFERENTIAL/PLATELET
Eosinophils Relative: 1.1 % (ref 0.0–5.0)
Lymphocytes Relative: 20.6 % (ref 12.0–46.0)
Monocytes Relative: 6.7 % (ref 3.0–12.0)
Neutrophils Relative %: 71.3 % (ref 43.0–77.0)
Platelets: 192 10*3/uL (ref 150.0–400.0)
WBC: 6.3 10*3/uL (ref 4.5–10.5)

## 2011-07-21 MED ORDER — NITROGLYCERIN 0.4 MG SL SUBL: 0.4000 mg | SUBLINGUAL_TABLET | SUBLINGUAL | Status: DC | PRN

## 2011-07-21 MED ORDER — NEBIVOLOL HCL 5 MG PO TABS: 5.0000 mg | ORAL_TABLET | Freq: Every day | ORAL | Status: DC

## 2011-07-21 MED ORDER — PRAVASTATIN SODIUM 40 MG PO TABS: 40.0000 mg | ORAL_TABLET | Freq: Every day | ORAL | Status: DC

## 2011-07-21 NOTE — Progress Notes (Signed)
  Subjective:    Patient ID: Victoria Gomez, female    DOB: Dec 05, 1924, 77 y.o.   MRN: [id number]  HPI Has been feeling week. Endurance is down. Feels SOB. Increased swelling in feet. Has not noted any rapid heart beats but has noted some pain under left breast. Blood pressure stable. Denies dark stools, diarrhea. Has noted a cough. Had mole removed from chest stated was a "melanoma" and has been seeing Dr in [place].      Review of Systems  Constitutional: Positive for activity change. Negative for appetite change and fatigue.  HENT: Negative for ear pain, congestion, neck pain, postnasal drip and sinus pressure.   Eyes: Negative for redness and visual disturbance.  Respiratory: Positive for shortness of breath. Negative for cough and wheezing.   Cardiovascular: Positive for palpitations and leg swelling.  Gastrointestinal: Negative for abdominal pain and abdominal distention.  Genitourinary: Negative for dysuria, frequency and menstrual problem.  Musculoskeletal: Positive for back pain. Negative for myalgias, joint swelling and arthralgias.  Skin: Negative for rash and wound.  Neurological: Positive for light-headedness. Negative for dizziness and headaches.  Hematological: Negative for adenopathy. Does not bruise/bleed easily.  Psychiatric/Behavioral: Negative for sleep disturbance and decreased concentration.       Objective:   Physical Exam  Nursing note and vitals reviewed. Constitutional: She is oriented to person, place, and time. She appears well-developed and well-nourished. No distress.  HENT:  Head: Normocephalic and atraumatic.  Right Ear: External ear normal.  Left Ear: External ear normal.  Nose: Nose normal.  Mouth/Throat: Oropharynx is clear and moist.  Eyes: Conjunctivae and EOM are normal. Pupils are equal, round, and reactive to light.  Neck: Normal range of motion. Neck supple. No JVD present. No tracheal deviation present. No thyromegaly present.    Cardiovascular: Normal rate and intact distal pulses.   Murmur heard.      bigemeny  Pulmonary/Chest: Effort normal and breath sounds normal. She has no wheezes. She has no rales. She exhibits no tenderness.  Abdominal: Soft. Bowel sounds are normal.  Musculoskeletal: Normal range of motion. She exhibits no edema and no tenderness.  Lymphadenopathy:    She has no cervical adenopathy.  Neurological: She is alert and oriented to person, place, and time. She has normal reflexes. No cranial nerve deficit.  Skin: Skin is warm and dry. She is not diaphoretic.  Psychiatric: She has a normal mood and affect. Her behavior is normal.          Assessment & Plan:  We'll obtain appropriate lab screening including a CBC TSH beta natruretic peptide. Also would like her to get a chest x-ray. She has a complicated past medical history including CAD and CHF.  I am concerned that her upper respiratory tract infection may mask CHF.  Her hypertension and esophageal reflux with a history of Barrett's appear to be stable

## 2011-07-22 LAB — BASIC METABOLIC PANEL
BUN: 24 mg/dL — ABNORMAL HIGH (ref 6–23)
Calcium: 9.4 mg/dL (ref 8.4–10.5)
Creatinine, Ser: 1.3 mg/dL — ABNORMAL HIGH (ref 0.4–1.2)
GFR: 42.54 mL/min — ABNORMAL LOW (ref >60.00)
Potassium: 4.2 mEq/L (ref 3.5–5.1)

## 2011-07-22 LAB — TSH: TSH: 0.47 u[IU]/mL (ref 0.35–5.50)

## 2011-07-25 ENCOUNTER — Telehealth: Payer: Self-pay | Admitting: *Deleted

## 2011-07-25 NOTE — Telephone Encounter (Signed)
Pt called requesting lab results- given to pt , but pt states she is still somewhat sob. Informed bnp was elevated- talked with dr Kirtland Bouchard and states not that high and can wait until dr Lovell Sheehan returns on Tuesday- also cxr was ok.  Pt informed

## 2011-07-29 ENCOUNTER — Telehealth: Payer: Self-pay | Admitting: Internal Medicine

## 2011-07-29 NOTE — Telephone Encounter (Signed)
Per dr Temple Pacini to see cardiologist due to waking up in night sob-pt informed

## 2011-07-29 NOTE — Telephone Encounter (Signed)
Pt would like to discuss chest xray result with doc.

## 2011-08-20 LAB — HEMOGLOBIN AND HEMATOCRIT, BLOOD
HCT: 35.4 — ABNORMAL LOW
Hemoglobin: 11.9 — ABNORMAL LOW

## 2011-08-20 LAB — BASIC METABOLIC PANEL
BUN: 17
Calcium: 9.5
Chloride: 108
Creatinine, Ser: 0.99
GFR calc Af Amer: >60
GFR calc non Af Amer: 54 — ABNORMAL LOW

## 2011-09-08 ENCOUNTER — Ambulatory Visit (INDEPENDENT_AMBULATORY_CARE_PROVIDER_SITE_OTHER)
Admission: RE | Admit: 2011-09-08 | Discharge: 2011-09-08 | Disposition: A | Discharge status: Home / Self Care | Payer: Medicare Other | Source: Ambulatory Visit | Attending: Cardiology | Admitting: Cardiology

## 2011-09-08 ENCOUNTER — Encounter: Payer: Self-pay | Admitting: Cardiology

## 2011-09-08 ENCOUNTER — Ambulatory Visit (INDEPENDENT_AMBULATORY_CARE_PROVIDER_SITE_OTHER): Payer: Medicare Other | Admitting: Cardiology

## 2011-09-08 DIAGNOSIS — R05 Cough: Secondary | ICD-10-CM

## 2011-09-08 DIAGNOSIS — R0602 Shortness of breath: Secondary | ICD-10-CM

## 2011-09-08 DIAGNOSIS — R059 Cough, unspecified: Secondary | ICD-10-CM | POA: Insufficient documentation

## 2011-09-08 DIAGNOSIS — I10 Essential (primary) hypertension: Secondary | ICD-10-CM

## 2011-09-08 DIAGNOSIS — E785 Hyperlipidemia, unspecified: Secondary | ICD-10-CM

## 2011-09-08 DIAGNOSIS — I2510 Atherosclerotic heart disease of native coronary artery without angina pectoris: Secondary | ICD-10-CM

## 2011-09-08 NOTE — Assessment & Plan Note (Signed)
The blood pressure is at target. No change in medications is indicated. We will continue with therapeutic lifestyle changes (TLC).  

## 2011-09-08 NOTE — Assessment & Plan Note (Signed)
Despite the mildly elevated BNP I doubt that this is CHF.  I will check an echo.  I will repeat a chest XRAY.  I will make a new patient appt to pulmonary.

## 2011-09-08 NOTE — Patient Instructions (Addendum)
Your physician has requested that you have an echocardiogram. Echocardiography is a painless test that uses sound waves to create images of your heart. It provides your doctor with information about the size and shape of your heart and how well your heart's chambers and valves are working. This procedure takes approximately one hour. There are no restrictions for this procedure.  Please have a chest X-ray at the [facility].  You have been referred to pulmonary for the evaluation of cough.  The current medical regimen is effective;  continue present plan and medications.  Follow up in 2 months with Dr Antoine Poche

## 2011-09-08 NOTE — Progress Notes (Signed)
HPI The patient presents for followup of CAD.  She spent the summer in [place].  Since I last saw her she has had a cough.  This has been over a couple of months.  Her chest XRAY in Sept was OK.  BNP was very mildly elevated.  She says that the cough is non productive. She denies fevers or chills. The cough has been somewhat paroxysmal. She is not describing any new PND or orthopnea. She has been progressively short of breath with exertion. However, she is not describing any chest pressure, neck or arm discomfort. She's had no new palpitations, presyncope or syncope. She's had some fleeting shooting chest discomfort but no substernal pressure, neck or arm discomfort. She's had no weight gain or edema.  Allergies  Allergen Reactions  . Codeine   . Sulfonamide Derivatives     Current Outpatient Prescriptions  Medication Sig Dispense Refill  . amLODipine-valsartan (EXFORGE) 5-320 MG per tablet Take 1 tablet by mouth daily.  90 tablet  3  . aspirin 81 MG tablet Take 81 mg by mouth daily.        . Calcium Carbonate-Vitamin D (CALCIUM 600+D) 600-200 MG-UNIT TABS Take by mouth 2 (two) times daily.        . Flaxseed, Linseed, (BIO-FLAX) 1000 MG CAPS Take 1 capsule by mouth 2 (two) times daily.    0  . furosemide (LASIX) 40 MG tablet Take 1 tablet (40 mg total) by mouth daily.  90 tablet  3  . isosorbide mononitrate (IMDUR) 60 MG 24 hr tablet Take 60 mg by mouth 2 (two) times daily. Pt request west-wards tabs       . lansoprazole (PREVACID) 30 MG capsule Take 30 mg by mouth 2 (two) times daily.        [name] levothyroxine (SYNTHROID, LEVOTHROID) 50 MCG tablet Take 1 tablet (50 mcg total) by mouth daily. Name brand only  90 tablet  3  . Multiple Vitamin (MULTIVITAMIN) tablet Take 1 tablet by mouth daily.        . nebivolol (BYSTOLIC) 5 MG tablet Take 1 tablet (5 mg total) by mouth daily.  90 tablet  3  . nitroGLYCERIN (NITROSTAT) 0.4 MG SL tablet Place 1 tablet (0.4 mg total) under the tongue every 5 (five) minutes  as needed. If pain after 3 pills- call 911  90 tablet  3  . pentosan polysulfate (ELMIRON) 100 MG capsule Take 100 mg by mouth 3 (three) times daily before meals.        . potassium chloride (KLOR-CON 10) 10 MEQ CR tablet Take 1 tablet (10 mEq total) by mouth daily.  90 tablet  3  . pravastatin (PRAVACHOL) 40 MG tablet Take 1 tablet (40 mg total) by mouth daily. Pt request west-wards tabs  90 tablet  3    Past Medical History  Diagnosis Date  . HYPERLIPIDEMIA 03/07/2008  . HYPERTENSION 08/03/2007  . CORONARY ARTERY DISEASE 08/03/2007  . Acute on chronic systolic heart failure 05/23/2009  . HYPOTHYROIDISM 08/03/2007  . Esophageal reflux 10/18/2007  . DIVERTICULOSIS, COLON 08/03/2007  . DISORDER, BLADDER NEC 08/03/2007  . BREAST MASS, BENIGN 05/23/2009  . Edema 02/19/2009  . BARRETT'S ESOPHAGUS, HX OF 08/03/2007  . Interstitial cystitis   . Arthritis   . Internal hemorrhoids   . Epistaxis   . Right hand fracture   . Diarrhea   . Bladder disorder     Past Surgical History  Procedure Date  . Breast surgery     benign  mass  . Cholecystectomy   . Cesarean section   . Appendectomy   . Cataract extraction, bilateral   . Tonsillectomy   . Abdominal hysterectomy   . Coronary stent placement   . Ankle reconstruction     Left  . Wrist reconstruction     Right    ROS:  Otherwise as stated in the HPI and negative for all other systems.  PHYSICAL EXAM BP 132/61  Pulse 57  Ht 5\' 3"  (1.6 m)  Wt 169 lb 1.9 oz (76.712 kg)  BMI 29.96 kg/m2 GENERAL:  Well appearing HEENT:  Pupils equal round and reactive, fundi not visualized, oral mucosa unremarkable NECK:  No jugular venous distention, waveform within normal limits, carotid upstroke brisk and symmetric, no bruits, no thyromegaly LYMPHATICS:  No cervical, inguinal adenopathy LUNGS:  Decreased breath sounds in the left lung base with few basilar crackles. BACK:  No CVA tenderness CHEST:  Unremarkable HEART:  PMI not displaced or  sustained,S1 and S2 within normal limits, no S3, no S4, no clicks, no rubs, no murmurs ABD:  Flat, positive bowel sounds normal in frequency in pitch, no bruits, no rebound, no guarding, no midline pulsatile mass, no hepatomegaly, no splenomegaly EXT:  2 plus pulses throughout, no edema, no cyanosis no clubbing SKIN:  No rashes no nodules NEURO:  Cranial nerves II through XII grossly intact, motor grossly intact throughout PSYCH:  Cognitively intact, oriented to person place and time  EKG:  NSR, LBBB, LAD, PACs  ASSESSMENT AND PLAN

## 2011-09-08 NOTE — Assessment & Plan Note (Signed)
She has no new symptoms consistent with angina.  No further testing is indicated at this point.  She will continue with risk reduction.

## 2011-09-08 NOTE — Assessment & Plan Note (Signed)
Lab Results  Component Value Date   CHOL 133 09/04/2010   HDL 40.90 09/04/2010   LDLCALC 73 09/04/2010   LDLDIRECT 79.7 12/11/2010   TRIG 98.0 09/04/2010   CHOLHDL 3 09/04/2010   She will continue the meds as listed.

## 2011-09-10 ENCOUNTER — Ambulatory Visit (HOSPITAL_COMMUNITY): Payer: Medicare Other | Attending: Cardiology | Admitting: Radiology

## 2011-09-10 DIAGNOSIS — R0602 Shortness of breath: Secondary | ICD-10-CM

## 2011-09-10 DIAGNOSIS — E039 Hypothyroidism, unspecified: Secondary | ICD-10-CM | POA: Insufficient documentation

## 2011-09-10 DIAGNOSIS — I10 Essential (primary) hypertension: Secondary | ICD-10-CM | POA: Insufficient documentation

## 2011-09-10 DIAGNOSIS — Z87891 Personal history of nicotine dependence: Secondary | ICD-10-CM | POA: Insufficient documentation

## 2011-09-10 DIAGNOSIS — I2510 Atherosclerotic heart disease of native coronary artery without angina pectoris: Secondary | ICD-10-CM | POA: Insufficient documentation

## 2011-09-10 DIAGNOSIS — I5022 Chronic systolic (congestive) heart failure: Secondary | ICD-10-CM | POA: Insufficient documentation

## 2011-09-10 DIAGNOSIS — E785 Hyperlipidemia, unspecified: Secondary | ICD-10-CM | POA: Insufficient documentation

## 2011-09-12 ENCOUNTER — Encounter: Payer: Self-pay | Admitting: Internal Medicine

## 2011-09-12 ENCOUNTER — Ambulatory Visit (INDEPENDENT_AMBULATORY_CARE_PROVIDER_SITE_OTHER): Payer: Medicare Other | Admitting: Internal Medicine

## 2011-09-12 VITALS — BP 122/72 | HR 52 | Temp 98.4°F | Ht 63.0 in | Wt 171.8 lb

## 2011-09-12 DIAGNOSIS — R05 Cough: Secondary | ICD-10-CM

## 2011-09-12 MED ORDER — HYDROCODONE-ACETAMINOPHEN 5-325 MG PO TABS: 1.0000 | ORAL_TABLET | ORAL | Status: DC | PRN

## 2011-09-12 MED ORDER — FAMOTIDINE 20 MG PO TABS: ORAL_TABLET | ORAL | Status: DC

## 2011-09-12 NOTE — Progress Notes (Signed)
  Subjective:    Patient ID: Victoria Gomez, female    DOB: [birth year], 77 y.o.   MRN: [id number]  HPI  60 yowf quit smoking at age 6 due to husband's illness with variable cough and sob since Christmas 2011 referred to pulmonary clinic by Dr Antoine Poche  09/12/2011 Initial pulmonary office eval in EMR cc recurrent cough started in [place] in Dec 2011 resolved p abx and prednisone then better for months now worse again since September 2012  Waxes and wanes s identifiable pattern and x 2 weeks  back  Bad again, worse p supper > mostly dry assoc sometimes with sneezing wut no wheezing, some sob with coughing fits only.   No excess mucus production, sense of too much throat mucus but  Sleeping ok without nocturnal  or early am exacerbation  of respiratory  c/o's or need for noct saba. Also denies any obvious fluctuation of symptoms with weather or environmental changes or other aggravating or alleviating factors except as outlined above    Review of Systems  Constitutional: Negative for fever and unexpected weight change.  HENT: Negative for ear pain, nosebleeds, congestion, sore throat, rhinorrhea, sneezing, trouble swallowing, dental problem, postnasal drip and sinus pressure.   Eyes: Negative for redness and itching.  Respiratory: Positive for cough and shortness of breath. Negative for chest tightness and wheezing.   Cardiovascular: Positive for chest pain. Negative for palpitations and leg swelling.  Gastrointestinal: Negative for nausea and vomiting.  Genitourinary: Negative for dysuria.  Musculoskeletal: Positive for joint swelling and arthralgias.  Skin: Negative for rash.  Neurological: Positive for headaches.  Hematological: Does not bruise/bleed easily.  Psychiatric/Behavioral: Negative for dysphoric mood. The patient is not nervous/anxious.        Objective:   Physical Exam  09/12/2011   89 amb eledler wf looks < stated age  HEENT: nl dentition, turbinates, and orophanx. Nl external  ear canals without cough reflex   NECK :  without JVD/Nodes/TM/ nl carotid upstrokes bilaterally   LUNGS: no acc muscle use, clear to A and P bilaterally without cough on insp or exp maneuvers   CV:  RRR  no s3 or murmur or increase in P2, no edema   ABD:  soft and nontender with nl excursion in the supine position. No bruits or organomegaly, bowel sounds nl  MS:  warm without deformities, calf tenderness, cyanosis or clubbing  SKIN: warm and dry without lesions    NEURO:  alert, approp, no deficits      cxr 09/08/11  Findings: Trachea is midline. Heart is mildly enlarged, stable.  Scarring is seen in the right middle lobe and lingula. No focal  airspace consolidation or pleural fluid. There may be mild  compression of a lower thoracic vertebral body superior endplate,  stable.  IMPRESSION:  Bibasilar scarring. No acute findings.       Assessment & Plan:

## 2011-09-12 NOTE — Patient Instructions (Signed)
Prevacid 30mg   Take 30- 60 min before your first and last meals of the day   Pepcid 20mg  one at Bedtime when coughing at all  GERD (REFLUX)  is an extremely common cause of respiratory symptoms, many times with no significant heartburn at all.    It can be treated with medication, but also with lifestyle changes including avoidance of late meals, excessive alcohol, smoking cessation, and avoid fatty foods, chocolate, peppermint, colas, red wine, and acidic juices such as orange juice.  NO MINT OR MENTHOL PRODUCTS SO NO COUGH DROPS  USE SUGARLESS CANDY INSTEAD (jolley ranchers or Stover's)  NO OIL BASED VITAMINS - use powdered substitutes.  Prednisone 10 mg take  4 each am x 2 days,   2 each am x 2 days,  1 each am x2days and stop    Take delsym two tsp every 12 hours and supplement if needed with narco  up to 2 every 4 hours to suppress the urge to cough. Swallowing water or using ice chips/non mint and menthol containing candies (such as lifesavers or sugarless jolly ranchers) are also effective.  You should rest your voice and avoid activities that you know make you cough.  Once you have eliminated the cough for 3 straight days try reducing the narco first,  then the delsym as tolerated.     If you are satisfied with your treatment plan let your doctor know and he/she can either refill your medications or you can return here when your prescription runs out.     If in any way you are not 100% satisfied,  please tell us.  If 100% better, tell your friends!

## 2011-09-13 NOTE — Assessment & Plan Note (Signed)
The most common causes of chronic cough in immunocompetent adults include the following: upper airway cough syndrome (UACS), previously referred to as postnasal drip syndrome (PNDS), which is caused by variety of rhinosinus conditions; (2) asthma; (3) GERD; (4) chronic bronchitis from cigarette smoking or other inhaled environmental irritants; (5) nonasthmatic eosinophilic bronchitis; and (6) bronchiectasis.   These conditions, singly or in combination, have accounted for up to 94% of the causes of chronic cough in prospective studies.   Other conditions have constituted no >6% of the causes in prospective studies These have included bronchogenic carcinoma, chronic interstitial pneumonia, sarcoidosis, left ventricular failure, ACEI-induced cough, and aspiration from a condition associated with pharyngeal dysfunction.   This is most likely  Classic Upper airway cough syndrome, so named because it's frequently impossible to sort out how much is  CR/sinusitis with freq throat clearing (which can be related to primary GERD)   vs  causing  secondary (" extra esophageal")  GERD from wide swings in gastric pressure that occur with throat clearing, often  promoting self use of mint and menthol lozenges that reduce the lower esophageal sphincter tone and exacerbate the problem further in a cyclical fashion.   These are the same pts who not infrequently have failed to tolerate ace inhibitors,  dry powder inhalers or biphosphonates or report having reflux symptoms that don't respond to standard doses of PPI , and are easily confused as having aecopd or asthma flares.  She already has known gerd.  Of the three most common causes of chronic cough, only one (GERD)  can actually cause the other two (asthma and post nasal drip syndrome)  and perpetuate the cylce of cough inducing airway trauma, inflammation, heightened sensitivity to reflux which is prompted by the cough itself via a cyclical mechanism.    This may  partially respond to steroids and look like asthma and post nasal drainage but never erradicated completely unless the cough and the secondary reflux are eliminated, preferably both at the same time.  While not intuitively obvious, many patients with chronic low grade reflux do not cough until there is a secondary insult that disturbs the protective epithelial barrier and exposes sensitive nerve endings.  This can be viral or direct physical injury such as with an endotracheal tube.   The point is that once this occurs, it is difficult to eliminate using anything but a maximally effective acid suppression regimen at least in the short run, accompanied by an appropriate diet to address non acid GERD.   See instructions for specific recommendations which were reviewed directly with the patient who was given a copy with highlighter outlining the key components.

## 2011-09-15 ENCOUNTER — Telehealth: Payer: Self-pay | Admitting: Internal Medicine

## 2011-09-15 MED ORDER — PREDNISONE 10 MG PO TABS: ORAL_TABLET | ORAL | Status: DC

## 2011-09-15 NOTE — Telephone Encounter (Signed)
Pt walked in today and spoke with Nicholos Johns and left msg stating that prednisone taper was never sent to pharm. According to records pt was supposed to have this sent in and so I have sent rx to [facility] [address]. Nicholos Johns made pt aware this is being done.

## 2011-09-19 ENCOUNTER — Ambulatory Visit: Payer: Medicare Other | Admitting: Cardiology

## 2011-09-19 NOTE — Patient Instructions (Signed)
Pt was scheduled to see pulmonary for the evaluation of her shortness of breathe and cough

## 2011-09-22 ENCOUNTER — Encounter: Payer: Self-pay | Admitting: Internal Medicine

## 2011-09-22 ENCOUNTER — Ambulatory Visit (INDEPENDENT_AMBULATORY_CARE_PROVIDER_SITE_OTHER): Payer: Medicare Other | Admitting: Internal Medicine

## 2011-09-22 VITALS — BP 140/78 | HR 56 | Temp 98.3°F | Resp 16 | Ht 63.0 in | Wt 168.0 lb

## 2011-09-22 DIAGNOSIS — E039 Hypothyroidism, unspecified: Secondary | ICD-10-CM

## 2011-09-22 DIAGNOSIS — E785 Hyperlipidemia, unspecified: Secondary | ICD-10-CM

## 2011-09-22 DIAGNOSIS — I5023 Acute on chronic systolic (congestive) heart failure: Secondary | ICD-10-CM

## 2011-09-22 DIAGNOSIS — I10 Essential (primary) hypertension: Secondary | ICD-10-CM

## 2011-09-22 DIAGNOSIS — K219 Gastro-esophageal reflux disease without esophagitis: Secondary | ICD-10-CM

## 2011-09-22 MED ORDER — ESOMEPRAZOLE MAGNESIUM 40 MG PO CPDR: 40.0000 mg | DELAYED_RELEASE_CAPSULE | Freq: Every day | ORAL | Status: DC

## 2011-09-22 NOTE — Patient Instructions (Signed)
The patient is instructed to continue all medications as prescribed. Schedule followup with check out clerk upon leaving the clinic  

## 2011-09-22 NOTE — Progress Notes (Signed)
Subjective:    Patient ID: Victoria Gomez, female    DOB: February 03, 1925, 77 y.o.   MRN: [id number]  HPI Patient seen in cardiology for her shortness of breath an echocardiogram was obtained which did not show any worsening of her diastolic dysfunction or diastolic dysfunction with classified as mild to moderate.  Her shortness of breath resulted in her being referred to pulmonary for evaluation and consultation with Dr. Sherene Sires. A BNP was slightly elevated shortness of breath was out of proportion with the BNP elevation Her CBC and thyroid were stable as of September  Review of Systems  Constitutional: Positive for fatigue. Negative for activity change and appetite change.  HENT: Negative for ear pain, congestion, neck pain, postnasal drip and sinus pressure.   Eyes: Negative for redness and visual disturbance.  Respiratory: Positive for shortness of breath. Negative for cough and wheezing.   Gastrointestinal: Negative for abdominal pain and abdominal distention.  Genitourinary: Negative for dysuria, frequency and menstrual problem.  Musculoskeletal: Negative for myalgias, joint swelling and arthralgias.  Skin: Negative for rash and wound.  Neurological: Negative for dizziness, weakness and headaches.  Hematological: Negative for adenopathy. Does not bruise/bleed easily.  Psychiatric/Behavioral: Negative for sleep disturbance and decreased concentration.   Past Medical History  Diagnosis Date  . HYPERLIPIDEMIA 03/07/2008  . HYPERTENSION 08/03/2007  . CORONARY ARTERY DISEASE 08/03/2007  . Acute on chronic systolic heart failure 05/23/2009  . HYPOTHYROIDISM 08/03/2007  . Esophageal reflux 10/18/2007  . DIVERTICULOSIS, COLON 08/03/2007  . DISORDER, BLADDER NEC 08/03/2007  . BREAST MASS, BENIGN 05/23/2009  . Edema 02/19/2009  . BARRETT'S ESOPHAGUS, HX OF 08/03/2007  . Interstitial cystitis   . Arthritis   . Internal hemorrhoids   . Epistaxis   . Right hand fracture   . Diarrhea   . Bladder  disorder    Past Surgical History  Procedure Date  . Breast surgery     benign mass  . Cholecystectomy   . Cesarean section   . Appendectomy   . Cataract extraction, bilateral   . Tonsillectomy   . Abdominal hysterectomy   . Ankle reconstruction     Left  . Wrist reconstruction     Right    reports that she quit smoking about 41 years ago. Her smoking use included Cigarettes. She has a 15 pack-year smoking history. She has never used smokeless tobacco. She reports that she does not drink alcohol or use illicit drugs. family history includes Colon cancer in her mother; Colon polyps in her sister; Coronary artery disease in her brother, father, mother, and sister; Diabetes type II in her son; and Heart disease in her father and mother. Allergies  Allergen Reactions  . Codeine   . Sulfonamide Derivatives         Objective:   Physical Exam  Nursing note reviewed. Constitutional: She is oriented to person, place, and time. She appears well-developed and well-nourished. No distress.  HENT:  Head: Normocephalic and atraumatic.  Right Ear: External ear normal.  Left Ear: External ear normal.  Nose: Nose normal.  Mouth/Throat: Oropharynx is clear and moist.  Eyes: Conjunctivae and EOM are normal. Pupils are equal, round, and reactive to light.  Neck: Normal range of motion. Neck supple. No JVD present. No tracheal deviation present. No thyromegaly present.  Cardiovascular: Normal rate, regular rhythm, normal heart sounds and intact distal pulses.   No murmur heard. Pulmonary/Chest: Effort normal and breath sounds normal. She has no wheezes. She exhibits no tenderness.  Abdominal: Soft.  Bowel sounds are normal.  Musculoskeletal: Normal range of motion. She exhibits no edema and no tenderness.  Lymphadenopathy:    She has no cervical adenopathy.  Neurological: She is alert and oriented to person, place, and time. She has normal reflexes. No cranial nerve deficit.  Skin: Skin is  warm and dry. She is not diaphoretic.  Psychiatric: She has a normal mood and affect. Her behavior is normal.          Assessment & Plan:  The pulmonary consultant believes that his esophageal reflux as the primary etiology of her cough and shortness of breath and that heart failure is not a major contributor to her symptoms.  This was also confirmed by her cardiologist who did an echocardiogram which shows no progression of diastolic dysfunction which has been classified as mild.  Her symptoms have improved on a PPI  And steroids A complicating issue is that she was given prednisone which also assisted her breathing.. this could be do to inflammation/asthma related to the GERD.  Samples of Nexium were given to the patient to take daily

## 2011-10-07 ENCOUNTER — Other Ambulatory Visit: Payer: Self-pay | Admitting: *Deleted

## 2011-10-07 MED ORDER — ISOSORBIDE MONONITRATE ER 60 MG PO TB24: 60.0000 mg | ORAL_TABLET | Freq: Two times a day (BID) | ORAL | Status: DC

## 2011-10-10 ENCOUNTER — Other Ambulatory Visit: Payer: Self-pay | Admitting: *Deleted

## 2011-10-10 MED ORDER — ISOSORBIDE MONONITRATE ER 60 MG PO TB24: 60.0000 mg | ORAL_TABLET | Freq: Two times a day (BID) | ORAL | Status: DC

## 2011-10-17 ENCOUNTER — Telehealth: Payer: Self-pay | Admitting: Internal Medicine

## 2011-10-17 NOTE — Telephone Encounter (Signed)
Pt's son says that the pt's cough has improved, however, pt is requesting a refill for Norco to help with her back pain. Dr. Sherene Sires, pls advise if okay for refill.

## 2011-10-17 NOTE — Telephone Encounter (Signed)
LMOM for son TCB

## 2011-10-17 NOTE — Telephone Encounter (Signed)
Ok x one refill but all further refills for back pain need to come from whoever is treating her back ? Ramos?

## 2011-10-20 NOTE — Telephone Encounter (Signed)
LMTCB x2  

## 2011-10-21 NOTE — Telephone Encounter (Signed)
LMOMTCB x 1. Will send refill after speaking with pt's son or the pt.

## 2011-10-22 ENCOUNTER — Telehealth: Payer: Self-pay | Admitting: Internal Medicine

## 2011-10-22 MED ORDER — HYDROCODONE-ACETAMINOPHEN 5-325 MG PO TABS: 1.0000 | ORAL_TABLET | Freq: Four times a day (QID) | ORAL | Status: DC | PRN

## 2011-10-22 NOTE — Telephone Encounter (Signed)
RX not sent. LMOMTCB. This is the 4th time we have tried to reach the pt or son and have received no callback. We will close this msg and pt will need to leave a new msg if still needing anything from our office.

## 2011-10-22 NOTE — Telephone Encounter (Signed)
Per previous phone note, okay to fill Norco one time only per MW. Future refills will need to come from the physician that is treating her back problems. Pt verbalized understanding. [facility] to [facility] [address] Per pt request.

## 2011-10-29 ENCOUNTER — Other Ambulatory Visit: Payer: Self-pay | Admitting: Internal Medicine

## 2011-10-29 MED ORDER — HYDROCODONE-ACETAMINOPHEN 5-325 MG PO TABS: 1.0000 | ORAL_TABLET | ORAL | Status: DC | PRN

## 2011-11-13 DIAGNOSIS — J019 Acute sinusitis, unspecified: Secondary | ICD-10-CM | POA: Diagnosis not present

## 2011-11-17 ENCOUNTER — Encounter: Payer: Self-pay | Admitting: Cardiology

## 2011-11-17 ENCOUNTER — Ambulatory Visit (INDEPENDENT_AMBULATORY_CARE_PROVIDER_SITE_OTHER): Payer: Medicare Other | Admitting: Cardiology

## 2011-11-17 VITALS — BP 118/62 | HR 57 | Ht 63.0 in | Wt 167.4 lb

## 2011-11-17 DIAGNOSIS — R0602 Shortness of breath: Secondary | ICD-10-CM | POA: Diagnosis not present

## 2011-11-17 DIAGNOSIS — I5030 Unspecified diastolic (congestive) heart failure: Secondary | ICD-10-CM

## 2011-11-17 DIAGNOSIS — I50814 Right heart failure due to left heart failure: Secondary | ICD-10-CM | POA: Insufficient documentation

## 2011-11-17 DIAGNOSIS — I2510 Atherosclerotic heart disease of native coronary artery without angina pectoris: Secondary | ICD-10-CM

## 2011-11-17 DIAGNOSIS — I5040 Unspecified combined systolic (congestive) and diastolic (congestive) heart failure: Secondary | ICD-10-CM | POA: Insufficient documentation

## 2011-11-17 DIAGNOSIS — I10 Essential (primary) hypertension: Secondary | ICD-10-CM

## 2011-11-17 NOTE — Progress Notes (Signed)
HPI The patient presents for followup of CAD.  At the last visit she had a cough with some dyspnea.  I sent her to pulmonary and she was treated with steroids and for reflux with some success.  She has had her PPI changed with some improvement in her cough.  The patient denies any new symptoms such as chest discomfort, neck or arm discomfort. There has been no new shortness of breath, PND or orthopnea. There have been no reported palpitations, presyncope or syncope.  Allergies  Allergen Reactions  . Codeine   . Sulfonamide Derivatives     Current Outpatient Prescriptions  Medication Sig Dispense Refill  . amLODipine-valsartan (EXFORGE) 5-320 MG per tablet Take 1 tablet by mouth daily.  90 tablet  3  . aspirin 81 MG tablet Take 81 mg by mouth daily.        . Calcium Carbonate-Vitamin D (CALCIUM 600+D) 600-200 MG-UNIT TABS Take by mouth 2 (two) times daily.        [name] esomeprazole (NEXIUM) 40 MG capsule Take 1 capsule (40 mg total) by mouth daily.  30 capsule  1  . famotidine (PEPCID) 20 MG tablet 20 mg at bedtime as needed. One at bedtime       . furosemide (LASIX) 40 MG tablet Take 1 tablet (40 mg total) by mouth daily.  90 tablet  3  . HYDROcodone-acetaminophen (NORCO) 5-325 MG per tablet Take 1 tablet by mouth every 4 (four) hours as needed for pain.  40 tablet  0  . isosorbide mononitrate (IMDUR) 60 MG 24 hr tablet Take 1 tablet (60 mg total) by mouth 2 (two) times daily. Pt request west-wards tabs  180 tablet  3  . levothyroxine (SYNTHROID, LEVOTHROID) 50 MCG tablet Take 1 tablet (50 mcg total) by mouth daily. Name brand only  90 tablet  3  . Multiple Vitamin (MULTIVITAMIN) tablet Take 1 tablet by mouth daily.        . nebivolol (BYSTOLIC) 5 MG tablet Take 1 tablet (5 mg total) by mouth daily.  90 tablet  3  . nitroGLYCERIN (NITROSTAT) 0.4 MG SL tablet Place 1 tablet (0.4 mg total) under the tongue every 5 (five) minutes as needed. If pain after 3 pills- call 911  90 tablet  3  .  pentosan polysulfate (ELMIRON) 100 MG capsule Take 100 mg by mouth 3 (three) times daily before meals.        . potassium chloride (KLOR-CON 10) 10 MEQ CR tablet Take 1 tablet (10 mEq total) by mouth daily.  90 tablet  3  . pravastatin (PRAVACHOL) 40 MG tablet Take 1 tablet (40 mg total) by mouth daily. Pt request west-wards tabs  90 tablet  3    Past Medical History  Diagnosis Date  . HYPERLIPIDEMIA 03/07/2008  . HYPERTENSION 08/03/2007  . CORONARY ARTERY DISEASE 08/03/2007  . Acute on chronic systolic heart failure 05/23/2009  . HYPOTHYROIDISM 08/03/2007  . Esophageal reflux 10/18/2007  . DIVERTICULOSIS, COLON 08/03/2007  . DISORDER, BLADDER NEC 08/03/2007  . BREAST MASS, BENIGN 05/23/2009  . Edema 02/19/2009  . BARRETT'S ESOPHAGUS, HX OF 08/03/2007  . Interstitial cystitis   . Arthritis   . Internal hemorrhoids   . Epistaxis   . Right hand fracture   . Diarrhea   . Bladder disorder     Past Surgical History  Procedure Date  . Breast surgery     benign mass  . Cholecystectomy   . Cesarean section   . Appendectomy   .  Cataract extraction, bilateral   . Tonsillectomy   . Abdominal hysterectomy   . Ankle reconstruction     Left  . Wrist reconstruction     Right    ROS:  Otherwise as stated in the HPI and negative for all other systems.  PHYSICAL EXAM BP 118/62  Pulse 57  Ht 5\' 3"  (1.6 m)  Wt 167 lb 6.4 oz (75.932 kg)  BMI 29.65 kg/m2 GENERAL:  Well appearing HEENT:  Pupils equal round and reactive, fundi not visualized, oral mucosa unremarkable NECK:  No jugular venous distention, waveform within normal limits, carotid upstroke brisk and symmetric, no bruits, no thyromegaly LYMPHATICS:  No cervical, inguinal adenopathy LUNGS:  Decreased breath sounds in the left lung base with few basilar crackles. BACK:  No CVA tenderness CHEST:  Unremarkable HEART:  PMI not displaced or sustained,S1 and S2 within normal limits, no S3, no S4, no clicks, no rubs, no murmurs ABD:  Flat,  positive bowel sounds normal in frequency in pitch, no bruits, no rebound, no guarding, no midline pulsatile mass, no hepatomegaly, no splenomegaly EXT:  2 plus pulses throughout, no edema, no cyanosis no clubbing SKIN:  No rashes no nodules NEURO:  Cranial nerves II through XII grossly intact, motor grossly intact throughout PSYCH:  Cognitively intact, oriented to person place and time  EKG:  NSR, LBBB, LAD, PACs  Rate 57. No change.  11/17/2011   ASSESSMENT AND PLAN

## 2011-11-17 NOTE — Patient Instructions (Signed)
The current medical regimen is effective;  continue present plan and medications.  Follow up in June 2013 with Dr Antoine Poche.  You will receive a letter in the mail 2 months before you are due.  Please call us when you receive this letter to schedule your follow up appointment.

## 2011-11-17 NOTE — Assessment & Plan Note (Signed)
She seems to be euvolemic.  At this point, no change in therapy is indicated.  We have reviewed salt and fluid restrictions.  No further cardiovascular testing is indicated.   

## 2011-11-17 NOTE — Assessment & Plan Note (Signed)
The patient has no new sypmtoms.  No further cardiovascular testing is indicated.  We will continue with aggressive risk reduction and meds as listed.  

## 2011-11-17 NOTE — Assessment & Plan Note (Addendum)
The blood pressure is at target. No change in medications is indicated. We will continue with therapeutic lifestyle changes (TLC).  

## 2011-11-24 ENCOUNTER — Ambulatory Visit (INDEPENDENT_AMBULATORY_CARE_PROVIDER_SITE_OTHER): Payer: Medicare Other | Admitting: Internal Medicine

## 2011-11-24 ENCOUNTER — Encounter: Payer: Self-pay | Admitting: Internal Medicine

## 2011-11-24 VITALS — BP 134/76 | HR 60 | Temp 98.2°F | Resp 16 | Ht 63.0 in | Wt 166.0 lb

## 2011-11-24 DIAGNOSIS — I10 Essential (primary) hypertension: Secondary | ICD-10-CM | POA: Diagnosis not present

## 2011-11-24 DIAGNOSIS — E039 Hypothyroidism, unspecified: Secondary | ICD-10-CM | POA: Diagnosis not present

## 2011-11-24 DIAGNOSIS — I2510 Atherosclerotic heart disease of native coronary artery without angina pectoris: Secondary | ICD-10-CM

## 2011-11-24 DIAGNOSIS — I5023 Acute on chronic systolic (congestive) heart failure: Secondary | ICD-10-CM

## 2011-11-24 DIAGNOSIS — K219 Gastro-esophageal reflux disease without esophagitis: Secondary | ICD-10-CM

## 2011-11-24 DIAGNOSIS — R0609 Other forms of dyspnea: Secondary | ICD-10-CM | POA: Diagnosis not present

## 2011-11-24 NOTE — Patient Instructions (Signed)
Take 1-1/2 tablets of the Lasix or 60 mg total for the next 3 days then resume your normal dose

## 2011-11-24 NOTE — Progress Notes (Signed)
Subjective:    Patient ID: Victoria Gomez, female    DOB: 1925-02-26, 78 y.o.   MRN: [id number]  HPI Patient is an 78 year old female who is followed for hypertension hyperlipidemia and mild congestive heart she was recently seen by cardiology with an elevated amount of extra heartbeats and with some fluid retention in her legs. When we saw her in September she had a slightly elevated BNP and a slight increase in her creatinine to 1.3. Her potassium was normal. Today she presents with normal blood pressure appears to be breathing comfortably has moderate edema in her feet and no respiratory distress.   Review of Systems  Constitutional: Negative for activity change, appetite change and fatigue.  HENT: Positive for congestion. Negative for ear pain, neck pain, postnasal drip and sinus pressure.   Eyes: Negative for redness and visual disturbance.  Respiratory: Positive for cough and chest tightness. Negative for shortness of breath and wheezing.   Cardiovascular: Positive for leg swelling.  Gastrointestinal: Negative for abdominal pain and abdominal distention.  Genitourinary: Negative for dysuria, frequency and menstrual problem.  Musculoskeletal: Negative for myalgias, joint swelling and arthralgias.  Skin: Negative for rash and wound.  Neurological: Negative for dizziness, weakness and headaches.  Hematological: Negative for adenopathy. Does not bruise/bleed easily.  Psychiatric/Behavioral: Negative for sleep disturbance and decreased concentration.   Past Medical History  Diagnosis Date  . HYPERLIPIDEMIA 03/07/2008  . HYPERTENSION 08/03/2007  . CORONARY ARTERY DISEASE 08/03/2007  . Acute on chronic systolic heart failure 05/23/2009  . HYPOTHYROIDISM 08/03/2007  . Esophageal reflux 10/18/2007  . DIVERTICULOSIS, COLON 08/03/2007  . DISORDER, BLADDER NEC 08/03/2007  . BREAST MASS, BENIGN 05/23/2009  . Edema 02/19/2009  . BARRETT'S ESOPHAGUS, HX OF 08/03/2007  . Interstitial cystitis   .  Arthritis   . Internal hemorrhoids   . Epistaxis   . Right hand fracture   . Diarrhea   . Bladder disorder     History   Social History  . Marital Status: Married    Spouse Name: N/A    Number of Children: N/A  . Years of Education: N/A   Occupational History  . retired    Social History Main Topics  . Smoking status: Former Smoker -- 1.0 packs/day for 15 years    Types: Cigarettes    Quit date: 11/10/1969  . Smokeless tobacco: Never Used  . Alcohol Use: No  . Drug Use: No  . Sexually Active: Yes    Birth Control/ Protection: Post-menopausal   Other Topics Concern  . Not on file   Social History Narrative  . No narrative on file    Past Surgical History  Procedure Date  . Breast surgery     benign mass  . Cholecystectomy   . Cesarean section   . Appendectomy   . Cataract extraction, bilateral   . Tonsillectomy   . Abdominal hysterectomy   . Ankle reconstruction     Left  . Wrist reconstruction     Right    Family History  Problem Relation Age of Onset  . Colon cancer Mother   . Coronary artery disease Mother   . Heart disease Mother   . Coronary artery disease Father   . Heart disease Father   . Colon polyps Sister   . Coronary artery disease Sister   . Coronary artery disease Brother   . Diabetes type II Son     Allergies  Allergen Reactions  . Codeine   . Sulfonamide Derivatives  Current Outpatient Prescriptions on File Prior to Visit  Medication Sig Dispense Refill  . amLODipine-valsartan (EXFORGE) 5-320 MG per tablet Take 1 tablet by mouth daily.  90 tablet  3  . aspirin 81 MG tablet Take 81 mg by mouth daily.        . Calcium Carbonate-Vitamin D (CALCIUM 600+D) 600-200 MG-UNIT TABS Take by mouth 2 (two) times daily.        [name] esomeprazole (NEXIUM) 40 MG capsule Take 1 capsule (40 mg total) by mouth daily.  30 capsule  1  . famotidine (PEPCID) 20 MG tablet 20 mg at bedtime as needed. One at bedtime       . furosemide (LASIX) 40 MG  tablet Take 1 tablet (40 mg total) by mouth daily.  90 tablet  3  . HYDROcodone-acetaminophen (NORCO) 5-325 MG per tablet Take 1 tablet by mouth every 4 (four) hours as needed for pain.  40 tablet  0  . isosorbide mononitrate (IMDUR) 60 MG 24 hr tablet Take 1 tablet (60 mg total) by mouth 2 (two) times daily. Pt request west-wards tabs  180 tablet  3  . levothyroxine (SYNTHROID, LEVOTHROID) 50 MCG tablet Take 1 tablet (50 mcg total) by mouth daily. Name brand only  90 tablet  3  . Multiple Vitamin (MULTIVITAMIN) tablet Take 1 tablet by mouth daily.        . nebivolol (BYSTOLIC) 5 MG tablet Take 1 tablet (5 mg total) by mouth daily.  90 tablet  3  . nitroGLYCERIN (NITROSTAT) 0.4 MG SL tablet Place 1 tablet (0.4 mg total) under the tongue every 5 (five) minutes as needed. If pain after 3 pills- call 911  90 tablet  3  . pentosan polysulfate (ELMIRON) 100 MG capsule Take 100 mg by mouth 3 (three) times daily before meals.        . potassium chloride (KLOR-CON 10) 10 MEQ CR tablet Take 1 tablet (10 mEq total) by mouth daily.  90 tablet  3  . pravastatin (PRAVACHOL) 40 MG tablet Take 1 tablet (40 mg total) by mouth daily. Pt request west-wards tabs  90 tablet  3    BP 134/76  Pulse 60  Temp 98.2 F (36.8 C)  Resp 16  Ht 5\' 3"  (1.6 m)  Wt 166 lb (75.297 kg)  BMI 29.41 kg/m2       Objective:   Physical Exam  Nursing note and vitals reviewed. Constitutional: She is oriented to person, place, and time. She appears well-developed and well-nourished.  HENT:  Head: Normocephalic and atraumatic.  Eyes: Conjunctivae are normal. Pupils are equal, round, and reactive to light.  Neck: Normal range of motion. Neck supple.  Cardiovascular: Normal rate.        bigeminy   Pulmonary/Chest: She has rales.       Slight crackles at the bottom of the lung fields bilaterally that clear partially with cough  Abdominal: Soft. Bowel sounds are normal.  Musculoskeletal: She exhibits edema.  Neurological: She  is alert and oriented to person, place, and time.  Skin: Skin is warm and dry.  Psychiatric: She has a normal mood and affect. Her behavior is normal.          Assessment & Plan:  Monitor basic metabolic panel today and blood count.  Increase Lasix for 3 days to 60 mg by mouth daily in the morning continue all other medications as prescribed.  Monitor for potassium replacement   Monitor creatinine for slight increase seen at last  office visit   Generally she appears very stable except for a minor imbalance in fluid

## 2012-01-19 ENCOUNTER — Ambulatory Visit: Payer: Medicare Other | Admitting: Internal Medicine

## 2012-01-22 ENCOUNTER — Other Ambulatory Visit: Payer: Self-pay | Admitting: Internal Medicine

## 2012-01-26 ENCOUNTER — Ambulatory Visit (INDEPENDENT_AMBULATORY_CARE_PROVIDER_SITE_OTHER): Payer: Medicare Other | Admitting: Internal Medicine

## 2012-01-26 ENCOUNTER — Encounter: Payer: Self-pay | Admitting: Internal Medicine

## 2012-01-26 VITALS — BP 140/80 | HR 56 | Temp 98.2°F | Resp 16 | Ht 63.0 in | Wt 164.0 lb

## 2012-01-26 DIAGNOSIS — I509 Heart failure, unspecified: Secondary | ICD-10-CM | POA: Diagnosis not present

## 2012-01-26 DIAGNOSIS — E785 Hyperlipidemia, unspecified: Secondary | ICD-10-CM

## 2012-01-26 DIAGNOSIS — R0600 Dyspnea, unspecified: Secondary | ICD-10-CM

## 2012-01-26 DIAGNOSIS — R0989 Other specified symptoms and signs involving the circulatory and respiratory systems: Secondary | ICD-10-CM

## 2012-01-26 DIAGNOSIS — I10 Essential (primary) hypertension: Secondary | ICD-10-CM | POA: Diagnosis not present

## 2012-01-26 DIAGNOSIS — R0609 Other forms of dyspnea: Secondary | ICD-10-CM | POA: Diagnosis not present

## 2012-01-26 MED ORDER — FUROSEMIDE 40 MG PO TABS: 60.0000 mg | ORAL_TABLET | Freq: Every day | ORAL | Status: DC

## 2012-01-26 NOTE — Patient Instructions (Addendum)
Continue a low-sodium diet and we are going to  increase the Lasix to 60 mg or 1-1/2 tablets a day  May use Tylenol PM for sleep

## 2012-01-26 NOTE — Progress Notes (Signed)
  Subjective:    Patient ID: Victoria Gomez, female    DOB: [birth year], 78 y.o.   MRN: [id number]  HPI The pt resent for follow up of mild CHF. Patient is a 78-year-old female who presents for followup of hypertension hyperlipidemia and a recent evidence of mild to moderate CHF.  She was given an increased dose of Lasix for 3 days she states that that did improve her shortness of breath but no shortness of breath is back in a stable manner.  Her blood pressure was initially elevated when she came in to the office at 154/80 after rest her blood pressure changed to 140/80.  She probably has mild persistent volume overload and needs to increase diuresis for both blood pressure and her shortness of breath    Review of Systems  Constitutional: Negative for activity change, appetite change and fatigue.  HENT: Negative for ear pain, congestion, neck pain, postnasal drip and sinus pressure.   Eyes: Negative for redness and visual disturbance.  Respiratory: Negative for cough, shortness of breath and wheezing.   Gastrointestinal: Negative for abdominal pain and abdominal distention.  Genitourinary: Negative for dysuria, frequency and menstrual problem.  Musculoskeletal: Negative for myalgias, joint swelling and arthralgias.  Skin: Negative for rash and wound.  Neurological: Negative for dizziness, weakness and headaches.  Hematological: Negative for adenopathy. Does not bruise/bleed easily.  Psychiatric/Behavioral: Negative for sleep disturbance and decreased concentration.       Objective:   Physical Exam  Constitutional: She is oriented to person, place, and time. She appears well-developed and well-nourished. No distress.  HENT:  Head: Normocephalic and atraumatic.  Right Ear: External ear normal.  Left Ear: External ear normal.  Nose: Nose normal.  Mouth/Throat: Oropharynx is clear and moist.  Eyes: Conjunctivae and EOM are normal. Pupils are equal, round, and reactive to light.   Neck: Normal range of motion. Neck supple. No JVD present. No tracheal deviation present. No thyromegaly present.  Cardiovascular: Normal rate, regular rhythm, normal heart sounds and intact distal pulses.   No murmur heard. Pulmonary/Chest: Effort normal and breath sounds normal. She has no wheezes. She exhibits no tenderness.  Abdominal: Soft. Bowel sounds are normal.  Musculoskeletal: Normal range of motion. She exhibits no edema and no tenderness.  Lymphadenopathy:    She has no cervical adenopathy.  Neurological: She is alert and oriented to person, place, and time. She has normal reflexes. No cranial nerve deficit.  Skin: Skin is warm and dry. She is not diaphoretic.  Psychiatric: She has a normal mood and affect. Her behavior is normal.          Assessment & Plan:   Stay on the Nexium as this has helped with the chest pain.  The shortness of breath is persistent but did improve with the increased diuresis therefore we are going to titrate the Lasix from 40-60 mg by mouth daily I believe this will also help with blood pressure control this has been a low-sodium diet and this has also helped.  She will continue all the medications we will see her in 6 weeks' time for that visit we have ordered a basic metabolic panel lipid and liver as well as a BNP.  For sleep we have suggested Tylenol PM but may consider a prescription drugs that fails

## 2012-02-13 DIAGNOSIS — J018 Other acute sinusitis: Secondary | ICD-10-CM | POA: Diagnosis not present

## 2012-03-01 ENCOUNTER — Other Ambulatory Visit (INDEPENDENT_AMBULATORY_CARE_PROVIDER_SITE_OTHER): Payer: Medicare Other

## 2012-03-01 DIAGNOSIS — I509 Heart failure, unspecified: Secondary | ICD-10-CM | POA: Diagnosis not present

## 2012-03-01 DIAGNOSIS — R0609 Other forms of dyspnea: Secondary | ICD-10-CM | POA: Diagnosis not present

## 2012-03-01 DIAGNOSIS — E785 Hyperlipidemia, unspecified: Secondary | ICD-10-CM

## 2012-03-01 DIAGNOSIS — I10 Essential (primary) hypertension: Secondary | ICD-10-CM | POA: Diagnosis not present

## 2012-03-01 DIAGNOSIS — R0600 Dyspnea, unspecified: Secondary | ICD-10-CM

## 2012-03-01 LAB — HEPATIC FUNCTION PANEL
AST: 18 U/L (ref 0–37)
Alkaline Phosphatase: 94 U/L (ref 39–117)
Bilirubin, Direct: 0.1 mg/dL (ref 0.0–0.3)
Total Bilirubin: 0.4 mg/dL (ref 0.3–1.2)

## 2012-03-01 LAB — BASIC METABOLIC PANEL
CO2: 23 mEq/L (ref 19–32)
Calcium: 9.1 mg/dL (ref 8.4–10.5)
Chloride: 108 mEq/L (ref 96–112)
Creatinine, Ser: 1.3 mg/dL — ABNORMAL HIGH (ref 0.4–1.2)
Glucose, Bld: 103 mg/dL — ABNORMAL HIGH (ref 70–99)

## 2012-03-01 LAB — LIPID PANEL
LDL Cholesterol: 63 mg/dL (ref 0–99)
Total CHOL/HDL Ratio: 3
Triglycerides: 121 mg/dL (ref 0.0–149.0)

## 2012-03-08 ENCOUNTER — Encounter: Payer: Self-pay | Admitting: Internal Medicine

## 2012-03-08 ENCOUNTER — Ambulatory Visit (INDEPENDENT_AMBULATORY_CARE_PROVIDER_SITE_OTHER): Payer: Medicare Other | Admitting: Internal Medicine

## 2012-03-08 VITALS — BP 130/80 | HR 60 | Temp 98.3°F | Resp 16 | Ht 63.0 in | Wt 162.0 lb

## 2012-03-08 DIAGNOSIS — E785 Hyperlipidemia, unspecified: Secondary | ICD-10-CM

## 2012-03-08 DIAGNOSIS — R0989 Other specified symptoms and signs involving the circulatory and respiratory systems: Secondary | ICD-10-CM

## 2012-03-08 DIAGNOSIS — I10 Essential (primary) hypertension: Secondary | ICD-10-CM

## 2012-03-08 DIAGNOSIS — R0600 Dyspnea, unspecified: Secondary | ICD-10-CM

## 2012-03-08 DIAGNOSIS — R0609 Other forms of dyspnea: Secondary | ICD-10-CM

## 2012-03-08 DIAGNOSIS — Z23 Encounter for immunization: Secondary | ICD-10-CM | POA: Diagnosis not present

## 2012-03-08 DIAGNOSIS — Z0000 Encounter for general adult medical examination without abnormal findings: Secondary | ICD-10-CM

## 2012-03-08 DIAGNOSIS — E039 Hypothyroidism, unspecified: Secondary | ICD-10-CM

## 2012-03-08 NOTE — Progress Notes (Signed)
Subjective:    Patient ID: Victoria Gomez, female    DOB: 1925-09-21, 78 y.o.   MRN: [id number]  HPI patient is a 78-year-old female who is followed for multiple problems including hypertension hypothyroidism hyperlipidemia and a history of CAD recently she has experienced increased fatigue and a sense of weakness that she describes as "shakiness" and a sense that her legs would drop out from under her.  She states that she begins the morning feeling okay but by midday she is at increased sense of weakness and that eating or rest during the day does not seem to affect this sensation.  The morning she takes her isosorbide to her by systolic and her Synthroid in the afternoon she takes her exforge  She takes the furosemide late in the        Review of Systems  Constitutional: Negative for activity change, appetite change and fatigue.  HENT: Negative for ear pain, congestion, neck pain, postnasal drip and sinus pressure.   Eyes: Negative for redness and visual disturbance.  Respiratory: Negative for cough, shortness of breath and wheezing.   Gastrointestinal: Negative for abdominal pain and abdominal distention.  Genitourinary: Negative for dysuria, frequency and menstrual problem.  Musculoskeletal: Negative for myalgias, joint swelling and arthralgias.  Skin: Negative for rash and wound.  Neurological: Negative for dizziness, weakness and headaches.  Hematological: Negative for adenopathy. Does not bruise/bleed easily.  Psychiatric/Behavioral: Negative for sleep disturbance and decreased concentration.   Past Medical History  Diagnosis Date  . HYPERLIPIDEMIA 03/07/2008  . HYPERTENSION 08/03/2007  . CORONARY ARTERY DISEASE 08/03/2007  . Acute on chronic systolic heart failure 05/23/2009  . HYPOTHYROIDISM 08/03/2007  . Esophageal reflux 10/18/2007  . DIVERTICULOSIS, COLON 08/03/2007  . DISORDER, BLADDER NEC 08/03/2007  . BREAST MASS, BENIGN 05/23/2009  . Edema 02/19/2009  . BARRETT'S  ESOPHAGUS, HX OF 08/03/2007  . Interstitial cystitis   . Arthritis   . Internal hemorrhoids   . Epistaxis   . Right hand fracture   . Diarrhea   . Bladder disorder     History   Social History  . Marital Status: Married    Spouse Name: N/A    Number of Children: N/A  . Years of Education: N/A   Occupational History  . retired    Social History Main Topics  . Smoking status: Former Smoker -- 1.0 packs/day for 15 years    Types: Cigarettes    Quit date: 11/10/1969  . Smokeless tobacco: Never Used  . Alcohol Use: No  . Drug Use: No  . Sexually Active: Yes    Birth Control/ Protection: Post-menopausal   Other Topics Concern  . Not on file   Social History Narrative  . No narrative on file    Past Surgical History  Procedure Date  . Breast surgery     benign mass  . Cholecystectomy   . Cesarean section   . Appendectomy   . Cataract extraction, bilateral   . Tonsillectomy   . Abdominal hysterectomy   . Ankle reconstruction     Left  . Wrist reconstruction     Right    Family History  Problem Relation Age of Onset  . Colon cancer Mother   . Coronary artery disease Mother   . Heart disease Mother   . Coronary artery disease Father   . Heart disease Father   . Colon polyps Sister   . Coronary artery disease Sister   . Coronary artery disease Brother   . Diabetes  type II Son     Allergies  Allergen Reactions  . Codeine   . Sulfonamide Derivatives     Current Outpatient Prescriptions on File Prior to Visit  Medication Sig Dispense Refill  . amLODipine-valsartan (EXFORGE) 5-320 MG per tablet Take 1 tablet by mouth daily.  90 tablet  3  . aspirin 81 MG tablet Take 81 mg by mouth daily.        . Calcium Carbonate-Vitamin D (CALCIUM 600+D) 600-200 MG-UNIT TABS Take by mouth 2 (two) times daily.        [name] esomeprazole (NEXIUM) 40 MG capsule Take 1 capsule (40 mg total) by mouth daily.  30 capsule  1  . famotidine (PEPCID) 20 MG tablet 20 mg at bedtime as  needed. One at bedtime       . furosemide (LASIX) 40 MG tablet Take 1.5 tablets (60 mg total) by mouth daily.  120 tablet  3  . HYDROcodone-acetaminophen (NORCO) 5-325 MG per tablet Take 1 tablet by mouth every 4 (four) hours as needed for pain.  40 tablet  0  . isosorbide mononitrate (IMDUR) 60 MG 24 hr tablet Take 1 tablet (60 mg total) by mouth 2 (two) times daily. Pt request west-wards tabs  180 tablet  3  . Multiple Vitamin (MULTIVITAMIN) tablet Take 1 tablet by mouth daily.        . nebivolol (BYSTOLIC) 5 MG tablet Take 1 tablet (5 mg total) by mouth daily.  90 tablet  3  . nitroGLYCERIN (NITROSTAT) 0.4 MG SL tablet Place 1 tablet (0.4 mg total) under the tongue every 5 (five) minutes as needed. If pain after 3 pills- call 911  90 tablet  3  . pentosan polysulfate (ELMIRON) 100 MG capsule Take 100 mg by mouth 3 (three) times daily before meals.        . potassium chloride (KLOR-CON 10) 10 MEQ CR tablet Take 1 tablet (10 mEq total) by mouth daily.  90 tablet  3  . pravastatin (PRAVACHOL) 40 MG tablet Take 1 tablet (40 mg total) by mouth daily. Pt request west-wards tabs  90 tablet  3  . SYNTHROID 50 MCG tablet TAKE 1 TABLET DAILY  90 tablet  3  . DISCONTD: famotidine (PEPCID) 20 MG tablet One at bedtime        BP 130/80  Pulse 60  Temp 98.3 F (36.8 C)  Resp 16  Ht 5\' 3"  (1.6 m)  Wt 162 lb (73.483 kg)  BMI 28.70 kg/m2       Objective:   Physical Exam  Nursing note and vitals reviewed. Constitutional: She is oriented to person, place, and time. She appears well-developed and well-nourished. No distress.  HENT:  Head: Normocephalic and atraumatic.  Right Ear: External ear normal.  Left Ear: External ear normal.  Nose: Nose normal.  Mouth/Throat: Oropharynx is clear and moist.  Eyes: Conjunctivae and EOM are normal. Pupils are equal, round, and reactive to light.  Neck: Normal range of motion. Neck supple. No JVD present. No tracheal deviation present. No thyromegaly present.    Cardiovascular: Normal rate, regular rhythm, normal heart sounds and intact distal pulses.   No murmur heard. Pulmonary/Chest: Effort normal and breath sounds normal. She has no wheezes. She exhibits no tenderness.  Abdominal: Soft. Bowel sounds are normal.  Musculoskeletal: Normal range of motion. She exhibits no edema and no tenderness.  Lymphadenopathy:    She has no cervical adenopathy.  Neurological: She is alert and oriented to person, place, and  time. She has normal reflexes. No cranial nerve deficit.  Skin: Skin is warm and dry. She is not diaphoretic.  Psychiatric: She has a normal mood and affect. Her behavior is normal.          Assessment & Plan:  We will reduce the Lasix from  60- 40. We will ask her to keep a record of her weight on daily basis if she gains 3 pounds or more in one day and her feet are swelling she should take the entire Lasix tablet a day. We will measure a beta natruretic peptide today to make sure that this is not heart failure but indeed overdiuresis. We'll  look at a basic metabolic panel to look at the creatinine and BUN the BUN is slightly elevated the creatinine is slightly elevated indicating slight overdiuresis her liver functions are great her cholesterol is great on her current medications.  I believe that we are over diuresing her and we will adjust that despite the BNP not showing a drop of the BMP is reflected more of the renal condition that it is CHF.  Her potassium is slightly low I will ask her to take an extra potassium for the next 2 day   Other possibilities could be anemia.  Could be a tachyarrhythmia Symptoms do not change fairly quickly with the adjustment of the diuresis she should see cardiology and that should be a consideration for Holter monitor

## 2012-03-08 NOTE — Patient Instructions (Signed)
Please take an extra potassium for the next 2 days Changed the 40 some mild to 40 mg daily

## 2012-03-08 NOTE — Progress Notes (Signed)
Addended by: Willy Eddy on: 03/08/2012 04:11 PM   Modules accepted: Orders

## 2012-03-15 DIAGNOSIS — M5137 Other intervertebral disc degeneration, lumbosacral region: Secondary | ICD-10-CM | POA: Diagnosis not present

## 2012-03-15 DIAGNOSIS — M545 Low back pain: Secondary | ICD-10-CM | POA: Diagnosis not present

## 2012-03-17 DIAGNOSIS — H35379 Puckering of macula, unspecified eye: Secondary | ICD-10-CM | POA: Diagnosis not present

## 2012-03-17 DIAGNOSIS — H01009 Unspecified blepharitis unspecified eye, unspecified eyelid: Secondary | ICD-10-CM | POA: Diagnosis not present

## 2012-03-17 DIAGNOSIS — H524 Presbyopia: Secondary | ICD-10-CM | POA: Diagnosis not present

## 2012-03-19 DIAGNOSIS — M5137 Other intervertebral disc degeneration, lumbosacral region: Secondary | ICD-10-CM | POA: Diagnosis not present

## 2012-03-19 DIAGNOSIS — M47817 Spondylosis without myelopathy or radiculopathy, lumbosacral region: Secondary | ICD-10-CM | POA: Diagnosis not present

## 2012-03-19 DIAGNOSIS — M4310 Spondylolisthesis, site unspecified: Secondary | ICD-10-CM | POA: Diagnosis not present

## 2012-03-30 ENCOUNTER — Other Ambulatory Visit: Payer: Self-pay | Admitting: Internal Medicine

## 2012-03-30 DIAGNOSIS — Z1231 Encounter for screening mammogram for malignant neoplasm of breast: Secondary | ICD-10-CM

## 2012-04-02 DIAGNOSIS — M5137 Other intervertebral disc degeneration, lumbosacral region: Secondary | ICD-10-CM | POA: Diagnosis not present

## 2012-04-07 DIAGNOSIS — N3010 Interstitial cystitis (chronic) without hematuria: Secondary | ICD-10-CM | POA: Diagnosis not present

## 2012-04-14 DIAGNOSIS — N3010 Interstitial cystitis (chronic) without hematuria: Secondary | ICD-10-CM | POA: Diagnosis not present

## 2012-04-14 DIAGNOSIS — N39498 Other specified urinary incontinence: Secondary | ICD-10-CM | POA: Diagnosis not present

## 2012-04-19 ENCOUNTER — Encounter: Payer: Self-pay | Admitting: Internal Medicine

## 2012-04-19 ENCOUNTER — Ambulatory Visit (INDEPENDENT_AMBULATORY_CARE_PROVIDER_SITE_OTHER): Payer: Medicare Other | Admitting: Internal Medicine

## 2012-04-19 ENCOUNTER — Ambulatory Visit: Payer: Medicare Other | Admitting: Cardiology

## 2012-04-19 VITALS — BP 132/76 | HR 72 | Temp 98.2°F | Resp 16 | Ht 63.0 in | Wt 164.0 lb

## 2012-04-19 DIAGNOSIS — I509 Heart failure, unspecified: Secondary | ICD-10-CM | POA: Diagnosis not present

## 2012-04-19 DIAGNOSIS — I10 Essential (primary) hypertension: Secondary | ICD-10-CM | POA: Diagnosis not present

## 2012-04-19 DIAGNOSIS — K219 Gastro-esophageal reflux disease without esophagitis: Secondary | ICD-10-CM

## 2012-04-19 LAB — BASIC METABOLIC PANEL
BUN: 23 mg/dL (ref 6–23)
CO2: 28 mEq/L (ref 19–32)
Calcium: 8.9 mg/dL (ref 8.4–10.5)
GFR: 56.61 mL/min — ABNORMAL LOW (ref >60.00)
Glucose, Bld: 85 mg/dL (ref 70–99)
Sodium: 145 mEq/L (ref 135–145)

## 2012-04-19 MED ORDER — AMLODIPINE BESYLATE-VALSARTAN 5-320 MG PO TABS: 1.0000 | ORAL_TABLET | Freq: Every day | ORAL | Status: DC

## 2012-04-19 MED ORDER — FUROSEMIDE 40 MG PO TABS: 60.0000 mg | ORAL_TABLET | Freq: Every day | ORAL | Status: DC

## 2012-04-19 MED ORDER — LANSOPRAZOLE 30 MG PO CPDR: 30.0000 mg | DELAYED_RELEASE_CAPSULE | Freq: Two times a day (BID) | ORAL | Status: DC

## 2012-04-19 NOTE — Progress Notes (Signed)
Subjective:    Patient ID: Victoria Gomez, female    DOB: May 08, 1925, 78 y.o.   MRN: [id number]  HPI Patient is an 78 year old patient who is followed for hypertension hyperlipidemia gastroesophageal reflux and osteoarthritis.  At her last office visit we felt that she had chronic cough from mild volume overload and she had wet crackles in the lung fields.  We increased her diuretics slightly and it has resulted in much better blood pressure control and significant improvement if not extinguished her cough. That leaves [place] to the conclusion that mild CHF was the etiology of her cough   Review of Systems  Constitutional: Negative for activity change, appetite change and fatigue.  HENT: Negative for ear pain, congestion, neck pain, postnasal drip and sinus pressure.   Eyes: Negative for redness and visual disturbance.  Respiratory: Negative for cough, shortness of breath and wheezing.   Gastrointestinal: Negative for abdominal pain and abdominal distention.  Genitourinary: Negative for dysuria, frequency and menstrual problem.  Musculoskeletal: Negative for myalgias, joint swelling and arthralgias.  Skin: Negative for rash and wound.  Neurological: Negative for dizziness, weakness and headaches.  Hematological: Negative for adenopathy. Does not bruise/bleed easily.  Psychiatric/Behavioral: Negative for sleep disturbance and decreased concentration.   Past Medical History  Diagnosis Date  . HYPERLIPIDEMIA 03/07/2008  . HYPERTENSION 08/03/2007  . CORONARY ARTERY DISEASE 08/03/2007  . Acute on chronic systolic heart failure 05/23/2009  . HYPOTHYROIDISM 08/03/2007  . Esophageal reflux 10/18/2007  . DIVERTICULOSIS, COLON 08/03/2007  . DISORDER, BLADDER NEC 08/03/2007  . BREAST MASS, BENIGN 05/23/2009  . Edema 02/19/2009  . BARRETT'S ESOPHAGUS, HX OF 08/03/2007  . Interstitial cystitis   . Arthritis   . Internal hemorrhoids   . Epistaxis   . Right hand fracture   . Diarrhea   . Bladder  disorder     History   Social History  . Marital Status: Married    Spouse Name: N/A    Number of Children: N/A  . Years of Education: N/A   Occupational History  . retired    Social History Main Topics  . Smoking status: Former Smoker -- 1.0 packs/day for 15 years    Types: Cigarettes    Quit date: 11/10/1969  . Smokeless tobacco: Never Used  . Alcohol Use: No  . Drug Use: No  . Sexually Active: Yes    Birth Control/ Protection: Post-menopausal   Other Topics Concern  . Not on file   Social History Narrative  . No narrative on file    Past Surgical History  Procedure Date  . Breast surgery     benign mass  . Cholecystectomy   . Cesarean section   . Appendectomy   . Cataract extraction, bilateral   . Tonsillectomy   . Abdominal hysterectomy   . Ankle reconstruction     Left  . Wrist reconstruction     Right    Family History  Problem Relation Age of Onset  . Colon cancer Mother   . Coronary artery disease Mother   . Heart disease Mother   . Coronary artery disease Father   . Heart disease Father   . Colon polyps Sister   . Coronary artery disease Sister   . Coronary artery disease Brother   . Diabetes type II Son     Allergies  Allergen Reactions  . Codeine   . Sulfonamide Derivatives     Current Outpatient Prescriptions on File Prior to Visit  Medication Sig Dispense Refill  .  aspirin 81 MG tablet Take 81 mg by mouth daily.        . Calcium Carbonate-Vitamin D (CALCIUM 600+D) 600-200 MG-UNIT TABS Take by mouth 2 (two) times daily.        [name] HYDROcodone-acetaminophen (NORCO) 5-325 MG per tablet Take 1 tablet by mouth every 4 (four) hours as needed for pain.  40 tablet  0  . isosorbide mononitrate (IMDUR) 60 MG 24 hr tablet Take 1 tablet (60 mg total) by mouth 2 (two) times daily. Pt request west-wards tabs  180 tablet  3  . Multiple Vitamin (MULTIVITAMIN) tablet Take 1 tablet by mouth daily.        . nebivolol (BYSTOLIC) 5 MG tablet Take 1  tablet (5 mg total) by mouth daily.  90 tablet  3  . nitroGLYCERIN (NITROSTAT) 0.4 MG SL tablet Place 1 tablet (0.4 mg total) under the tongue every 5 (five) minutes as needed. If pain after 3 pills- call 911  90 tablet  3  . pentosan polysulfate (ELMIRON) 100 MG capsule Take 100 mg by mouth 3 (three) times daily before meals.        . potassium chloride (KLOR-CON 10) 10 MEQ CR tablet Take 1 tablet (10 mEq total) by mouth daily.  90 tablet  3  . pravastatin (PRAVACHOL) 40 MG tablet Take 1 tablet (40 mg total) by mouth daily. Pt request west-wards tabs  90 tablet  3  . SYNTHROID 50 MCG tablet TAKE 1 TABLET DAILY  90 tablet  3  . lansoprazole (PREVACID) 30 MG capsule Take 1 capsule (30 mg total) by mouth 2 (two) times daily.  180 capsule  3  . DISCONTD: amLODipine-valsartan (EXFORGE) 5-320 MG per tablet Take 1 tablet by mouth daily.  90 tablet  3  . DISCONTD: furosemide (LASIX) 40 MG tablet Take 1.5 tablets (60 mg total) by mouth daily.  120 tablet  3    BP 132/76  Pulse 72  Temp 98.2 F (36.8 C)  Resp 16  Ht 5\' 3"  (1.6 m)  Wt 164 lb (74.39 kg)  BMI 29.05 kg/m2        Objective:   Physical Exam  Constitutional: She is oriented to person, place, and time. She appears well-developed and well-nourished. No distress.  HENT:  Head: Normocephalic and atraumatic.  Right Ear: External ear normal.  Left Ear: External ear normal.  Nose: Nose normal.  Mouth/Throat: Oropharynx is clear and moist.  Eyes: Conjunctivae and EOM are normal. Pupils are equal, round, and reactive to light.  Neck: Normal range of motion. Neck supple. No JVD present. No tracheal deviation present. No thyromegaly present.  Cardiovascular: Normal rate and regular rhythm.   Murmur heard. Pulmonary/Chest: Effort normal. She has wheezes. She exhibits no tenderness.       No crackles  Abdominal: Soft. Bowel sounds are normal.  Musculoskeletal: Normal range of motion. She exhibits no edema and no tenderness.    Lymphadenopathy:    She has no cervical adenopathy.  Neurological: She is alert and oriented to person, place, and time. She has normal reflexes. No cranial nerve deficit.  Skin: Skin is warm and dry. She is not diaphoretic.  Psychiatric: She has a normal mood and affect. Her behavior is normal.          Assessment & Plan:  Patient is a complicated female with multiple concurrent problems but I do believe that the titration of the diuretic has been very effective but the blood pressure control and CHF control.  Patient's cough has completely ceased  We will give her samples of her medications today including her exforge  I believe that she should be back on a proton pump inhibitor for gastroesophageal reflux and we will place her on Prevacid 30 mg by mouth twice a day we have given her some samples of dexilant For her GERD. Blood pressure stable on current medications She has no current worsening chest pain

## 2012-04-19 NOTE — Patient Instructions (Signed)
The patient is instructed to continue all medications as prescribed. Schedule followup with check out clerk upon leaving the clinic  

## 2012-04-20 ENCOUNTER — Ambulatory Visit (INDEPENDENT_AMBULATORY_CARE_PROVIDER_SITE_OTHER): Payer: Medicare Other | Admitting: Cardiology

## 2012-04-20 ENCOUNTER — Encounter: Payer: Self-pay | Admitting: Cardiology

## 2012-04-20 VITALS — BP 130/55 | HR 49 | Ht 63.0 in | Wt 163.8 lb

## 2012-04-20 DIAGNOSIS — I5023 Acute on chronic systolic (congestive) heart failure: Secondary | ICD-10-CM | POA: Diagnosis not present

## 2012-04-20 DIAGNOSIS — I10 Essential (primary) hypertension: Secondary | ICD-10-CM

## 2012-04-20 DIAGNOSIS — R5381 Other malaise: Secondary | ICD-10-CM

## 2012-04-20 DIAGNOSIS — I2510 Atherosclerotic heart disease of native coronary artery without angina pectoris: Secondary | ICD-10-CM | POA: Diagnosis not present

## 2012-04-20 DIAGNOSIS — R5383 Other fatigue: Secondary | ICD-10-CM | POA: Insufficient documentation

## 2012-04-20 MED ORDER — NEBIVOLOL HCL 2.5 MG PO TABS: 2.5000 mg | ORAL_TABLET | Freq: Every day | ORAL | Status: DC

## 2012-04-20 NOTE — Patient Instructions (Signed)
Please decrease your Bystolic to 2.5 mg daily Continue all other medications as listed  Follow up with Dr Antoine Poche in October.  Please call the office in September to scheduled your appointment.

## 2012-04-20 NOTE — Assessment & Plan Note (Signed)
I will have her reduce her Bystolic to 2.5 mg po daily.  Her heart rate is low and I will see if this works.

## 2012-04-20 NOTE — Progress Notes (Signed)
HPI The patient presents for followup of CAD.  Since I last saw her her cough has resolved. She's on a slightly higher dose of diuretic and seems to be breathing better. She's not having any chest pressure, neck or arm discomfort. She's not having any PND or orthopnea. She has had no palpitations, presyncope or syncope. She does complain of episodic tiredness and more fatigued than she recalls.  Allergies  Allergen Reactions  . Codeine   . Sulfonamide Derivatives     Current Outpatient Prescriptions  Medication Sig Dispense Refill  . amLODipine-valsartan (EXFORGE) 5-320 MG per tablet Take 1 tablet by mouth daily.  90 tablet  3  . aspirin 81 MG tablet Take 81 mg by mouth daily.        . Calcium Carbonate-Vitamin D (CALCIUM 600+D) 600-200 MG-UNIT TABS Take by mouth 2 (two) times daily.        . furosemide (LASIX) 40 MG tablet Take 1.5 tablets (60 mg total) by mouth daily.  140 tablet  3  . HYDROcodone-acetaminophen (NORCO) 5-325 MG per tablet Take 1 tablet by mouth every 4 (four) hours as needed for pain.  40 tablet  0  . isosorbide mononitrate (IMDUR) 60 MG 24 hr tablet Take 1 tablet (60 mg total) by mouth 2 (two) times daily. Pt request west-wards tabs  180 tablet  3  . lansoprazole (PREVACID) 30 MG capsule Take 1 capsule (30 mg total) by mouth 2 (two) times daily.  180 capsule  3  . Multiple Vitamin (MULTIVITAMIN) tablet Take 1 tablet by mouth daily.        . nebivolol (BYSTOLIC) 5 MG tablet Take 1 tablet (5 mg total) by mouth daily.  90 tablet  3  . nitroGLYCERIN (NITROSTAT) 0.4 MG SL tablet Place 1 tablet (0.4 mg total) under the tongue every 5 (five) minutes as needed. If pain after 3 pills- call 911  90 tablet  3  . pentosan polysulfate (ELMIRON) 100 MG capsule Take 100 mg by mouth 3 (three) times daily before meals.        . potassium chloride (KLOR-CON 10) 10 MEQ CR tablet Take 1 tablet (10 mEq total) by mouth daily.  90 tablet  3  . pravastatin (PRAVACHOL) 40 MG tablet Take 1  tablet (40 mg total) by mouth daily. Pt request west-wards tabs  90 tablet  3  . SYNTHROID 50 MCG tablet TAKE 1 TABLET DAILY  90 tablet  3    Past Medical History  Diagnosis Date  . HYPERLIPIDEMIA 03/07/2008  . HYPERTENSION 08/03/2007  . CORONARY ARTERY DISEASE 08/03/2007  . Acute on chronic systolic heart failure 05/23/2009  . HYPOTHYROIDISM 08/03/2007  . Esophageal reflux 10/18/2007  . DIVERTICULOSIS, COLON 08/03/2007  . DISORDER, BLADDER NEC 08/03/2007  . BREAST MASS, BENIGN 05/23/2009  . Edema 02/19/2009  . BARRETT'S ESOPHAGUS, HX OF 08/03/2007  . Interstitial cystitis   . Arthritis   . Internal hemorrhoids   . Epistaxis   . Right hand fracture   . Diarrhea   . Bladder disorder     Past Surgical History  Procedure Date  . Breast surgery     benign mass  . Cholecystectomy   . Cesarean section   . Appendectomy   . Cataract extraction, bilateral   . Tonsillectomy   . Abdominal hysterectomy   . Ankle reconstruction     Left  . Wrist reconstruction     Right    ROS:  Otherwise as stated in the HPI and  negative for all other systems.  PHYSICAL EXAM BP 130/55  Pulse 49  Ht 5\' 3"  (1.6 m)  Wt 163 lb 12.8 oz (74.299 kg)  BMI 29.02 kg/m2 GENERAL:  Well appearing HEENT:  Pupils equal round and reactive, fundi not visualized, oral mucosa unremarkable NECK:  No jugular venous distention, waveform within normal limits, carotid upstroke brisk and symmetric, no bruits, no thyromegaly LYMPHATICS:  No cervical, inguinal adenopathy LUNGS:  Decreased breath sounds in the left lung base with few basilar crackles. BACK:  No CVA tenderness CHEST: Unremarkable HEART:  PMI not displaced or sustained,S1 and S2 within normal limits, no S3, no S4, no clicks, no rubs, no murmurs ABD:  Flat, positive bowel sounds normal in frequency in pitch, no bruits, no rebound, no guarding, no midline pulsatile mass, no hepatomegaly, no splenomegaly EXT:  2 plus pulses throughout, no edema, no cyanosis no  clubbing SKIN:  No rashes no nodules NEURO:  Cranial nerves II through XII grossly intact, motor grossly intact throughout PSYCH:  Cognitively intact, oriented to person place and time  EKG:  NSR, LBBB, LAD, PACs  Rate 49. No change.  04/20/2012   ASSESSMENT AND PLAN

## 2012-04-20 NOTE — Assessment & Plan Note (Signed)
Her echo in October last year demonstrated a preserved ejection fraction. She has some diastolic dysfunction. She seems to be euvolemic and she will continue the meds as listed.

## 2012-04-20 NOTE — Assessment & Plan Note (Signed)
The patient has no new sypmtoms.  No further cardiovascular testing is indicated.  We will continue with aggressive risk reduction and meds as listed.  

## 2012-04-20 NOTE — Assessment & Plan Note (Signed)
The blood pressure is at target. No change in medications is indicated. We will continue with therapeutic lifestyle changes (TLC).  

## 2012-04-28 DIAGNOSIS — M5137 Other intervertebral disc degeneration, lumbosacral region: Secondary | ICD-10-CM | POA: Diagnosis not present

## 2012-04-28 DIAGNOSIS — M545 Low back pain: Secondary | ICD-10-CM | POA: Diagnosis not present

## 2012-05-10 ENCOUNTER — Ambulatory Visit
Admission: RE | Admit: 2012-05-10 | Discharge: 2012-05-10 | Disposition: A | Discharge status: Home / Self Care | Payer: Medicare Other | Source: Ambulatory Visit | Attending: Internal Medicine | Admitting: Internal Medicine

## 2012-05-10 DIAGNOSIS — Z1231 Encounter for screening mammogram for malignant neoplasm of breast: Secondary | ICD-10-CM

## 2012-06-04 DIAGNOSIS — J018 Other acute sinusitis: Secondary | ICD-10-CM | POA: Diagnosis not present

## 2012-06-04 DIAGNOSIS — H6690 Otitis media, unspecified, unspecified ear: Secondary | ICD-10-CM | POA: Diagnosis not present

## 2012-06-04 DIAGNOSIS — J209 Acute bronchitis, unspecified: Secondary | ICD-10-CM | POA: Diagnosis not present

## 2012-06-24 DIAGNOSIS — M5137 Other intervertebral disc degeneration, lumbosacral region: Secondary | ICD-10-CM | POA: Diagnosis not present

## 2012-07-07 ENCOUNTER — Other Ambulatory Visit: Payer: Self-pay | Admitting: Internal Medicine

## 2012-08-10 DIAGNOSIS — Z8679 Personal history of other diseases of the circulatory system: Secondary | ICD-10-CM | POA: Insufficient documentation

## 2012-08-10 DIAGNOSIS — I951 Orthostatic hypotension: Secondary | ICD-10-CM

## 2012-08-10 DIAGNOSIS — R001 Bradycardia, unspecified: Secondary | ICD-10-CM

## 2012-08-10 HISTORY — DX: Bradycardia, unspecified: R00.1

## 2012-08-10 HISTORY — DX: Orthostatic hypotension: I95.1

## 2012-08-13 DIAGNOSIS — Z23 Encounter for immunization: Secondary | ICD-10-CM | POA: Diagnosis not present

## 2012-08-16 ENCOUNTER — Ambulatory Visit: Payer: Medicare Other | Admitting: Internal Medicine

## 2012-08-30 ENCOUNTER — Observation Stay (HOSPITAL_COMMUNITY)
Admission: EM | Admit: 2012-08-30 | Discharge: 2012-09-01 | Disposition: A | Discharge status: Home / Self Care | Payer: Medicare Other | Attending: Internal Medicine | Admitting: Internal Medicine

## 2012-08-30 ENCOUNTER — Encounter (HOSPITAL_COMMUNITY): Payer: Self-pay | Admitting: Emergency Medicine

## 2012-08-30 ENCOUNTER — Emergency Department (HOSPITAL_COMMUNITY): Payer: Medicare Other

## 2012-08-30 DIAGNOSIS — I517 Cardiomegaly: Secondary | ICD-10-CM | POA: Diagnosis not present

## 2012-08-30 DIAGNOSIS — N3010 Interstitial cystitis (chronic) without hematuria: Secondary | ICD-10-CM

## 2012-08-30 DIAGNOSIS — R55 Syncope and collapse: Secondary | ICD-10-CM

## 2012-08-30 DIAGNOSIS — R079 Chest pain, unspecified: Secondary | ICD-10-CM | POA: Diagnosis not present

## 2012-08-30 DIAGNOSIS — I498 Other specified cardiac arrhythmias: Secondary | ICD-10-CM

## 2012-08-30 DIAGNOSIS — Z8719 Personal history of other diseases of the digestive system: Secondary | ICD-10-CM

## 2012-08-30 DIAGNOSIS — E039 Hypothyroidism, unspecified: Secondary | ICD-10-CM

## 2012-08-30 DIAGNOSIS — I10 Essential (primary) hypertension: Secondary | ICD-10-CM

## 2012-08-30 DIAGNOSIS — I50814 Right heart failure due to left heart failure: Secondary | ICD-10-CM | POA: Diagnosis present

## 2012-08-30 DIAGNOSIS — R209 Unspecified disturbances of skin sensation: Secondary | ICD-10-CM | POA: Insufficient documentation

## 2012-08-30 DIAGNOSIS — E785 Hyperlipidemia, unspecified: Secondary | ICD-10-CM

## 2012-08-30 DIAGNOSIS — K219 Gastro-esophageal reflux disease without esophagitis: Secondary | ICD-10-CM

## 2012-08-30 DIAGNOSIS — I509 Heart failure, unspecified: Secondary | ICD-10-CM

## 2012-08-30 DIAGNOSIS — N644 Mastodynia: Secondary | ICD-10-CM | POA: Diagnosis not present

## 2012-08-30 DIAGNOSIS — I5030 Unspecified diastolic (congestive) heart failure: Secondary | ICD-10-CM

## 2012-08-30 DIAGNOSIS — M79609 Pain in unspecified limb: Secondary | ICD-10-CM | POA: Insufficient documentation

## 2012-08-30 DIAGNOSIS — N3289 Other specified disorders of bladder: Secondary | ICD-10-CM

## 2012-08-30 DIAGNOSIS — Z9861 Coronary angioplasty status: Secondary | ICD-10-CM | POA: Diagnosis not present

## 2012-08-30 DIAGNOSIS — M7989 Other specified soft tissue disorders: Secondary | ICD-10-CM | POA: Diagnosis not present

## 2012-08-30 DIAGNOSIS — R040 Epistaxis: Secondary | ICD-10-CM

## 2012-08-30 DIAGNOSIS — I2510 Atherosclerotic heart disease of native coronary artery without angina pectoris: Secondary | ICD-10-CM

## 2012-08-30 DIAGNOSIS — I5040 Unspecified combined systolic (congestive) and diastolic (congestive) heart failure: Secondary | ICD-10-CM | POA: Diagnosis present

## 2012-08-30 DIAGNOSIS — K648 Other hemorrhoids: Secondary | ICD-10-CM

## 2012-08-30 DIAGNOSIS — R5383 Other fatigue: Secondary | ICD-10-CM

## 2012-08-30 DIAGNOSIS — R609 Edema, unspecified: Secondary | ICD-10-CM

## 2012-08-30 DIAGNOSIS — K5730 Diverticulosis of large intestine without perforation or abscess without bleeding: Secondary | ICD-10-CM | POA: Diagnosis not present

## 2012-08-30 DIAGNOSIS — N63 Unspecified lump in unspecified breast: Secondary | ICD-10-CM

## 2012-08-30 DIAGNOSIS — M5416 Radiculopathy, lumbar region: Secondary | ICD-10-CM

## 2012-08-30 DIAGNOSIS — M129 Arthropathy, unspecified: Secondary | ICD-10-CM

## 2012-08-30 DIAGNOSIS — T887XXA Unspecified adverse effect of drug or medicament, initial encounter: Secondary | ICD-10-CM

## 2012-08-30 DIAGNOSIS — R001 Bradycardia, unspecified: Secondary | ICD-10-CM

## 2012-08-30 DIAGNOSIS — R404 Transient alteration of awareness: Secondary | ICD-10-CM | POA: Diagnosis not present

## 2012-08-30 DIAGNOSIS — I5023 Acute on chronic systolic (congestive) heart failure: Secondary | ICD-10-CM

## 2012-08-30 DIAGNOSIS — I25119 Atherosclerotic heart disease of native coronary artery with unspecified angina pectoris: Secondary | ICD-10-CM | POA: Diagnosis present

## 2012-08-30 LAB — TROPONIN I
Troponin I: <0.3 ng/mL (ref <0.30)
Troponin I: <0.3 ng/mL (ref <0.30)

## 2012-08-30 LAB — BASIC METABOLIC PANEL
CO2: 28 mEq/L (ref 19–32)
Calcium: 8.5 mg/dL (ref 8.4–10.5)
Chloride: 106 mEq/L (ref 96–112)
Creatinine, Ser: 1.28 mg/dL — ABNORMAL HIGH (ref 0.50–1.10)
Glucose, Bld: 126 mg/dL — ABNORMAL HIGH (ref 70–99)

## 2012-08-30 LAB — CBC
HCT: 35.3 % — ABNORMAL LOW (ref 36.0–46.0)
Hemoglobin: 11.5 g/dL — ABNORMAL LOW (ref 12.0–15.0)
MCH: 28.3 pg (ref 26.0–34.0)
MCH: 28.1 pg (ref 26.0–34.0)
MCHC: 32.6 g/dL (ref 30.0–36.0)
MCHC: 32.4 g/dL (ref 30.0–36.0)
MCV: 86.7 fL (ref 78.0–100.0)
MCV: 86.7 fL (ref 78.0–100.0)
Platelets: 171 10*3/uL (ref 150–400)
RDW: 13.2 % (ref 11.5–15.5)
RDW: 13.2 % (ref 11.5–15.5)

## 2012-08-30 MED ORDER — AMLODIPINE BESYLATE-VALSARTAN 5-320 MG PO TABS: 1.0000 | ORAL_TABLET | Freq: Every day | ORAL | Status: DC

## 2012-08-30 MED ORDER — PENTOSAN POLYSULFATE SODIUM 100 MG PO CAPS
100.0000 mg | ORAL_CAPSULE | Freq: Three times a day (TID) | ORAL | Status: DC
  Administered 2012-08-30 – 2012-09-01 (×7): 100 mg via ORAL
  Filled 2012-08-30 (×12): qty 1

## 2012-08-30 MED ORDER — ACETAMINOPHEN 650 MG RE SUPP: 650.0000 mg | Freq: Four times a day (QID) | RECTAL | Status: DC | PRN

## 2012-08-30 MED ORDER — AMLODIPINE BESYLATE 5 MG PO TABS
5.0000 mg | ORAL_TABLET | Freq: Every day | ORAL | Status: DC
  Administered 2012-08-30 – 2012-09-01 (×3): 5 mg via ORAL
  Filled 2012-08-30 (×3): qty 1

## 2012-08-30 MED ORDER — POTASSIUM CHLORIDE CRYS ER 10 MEQ PO TBCR
10.0000 meq | EXTENDED_RELEASE_TABLET | Freq: Every day | ORAL | Status: DC
  Administered 2012-08-30 – 2012-09-01 (×3): 10 meq via ORAL
  Filled 2012-08-30 (×3): qty 1

## 2012-08-30 MED ORDER — ENOXAPARIN SODIUM 40 MG/0.4ML ~~LOC~~ SOLN
40.0000 mg | SUBCUTANEOUS | Status: DC
  Administered 2012-08-30 – 2012-09-01 (×3): 40 mg via SUBCUTANEOUS
  Filled 2012-08-30 (×3): qty 0.4

## 2012-08-30 MED ORDER — IRBESARTAN 300 MG PO TABS
300.0000 mg | ORAL_TABLET | Freq: Every day | ORAL | Status: DC
  Administered 2012-08-30 – 2012-09-01 (×3): 300 mg via ORAL
  Filled 2012-08-30 (×3): qty 1

## 2012-08-30 MED ORDER — ONDANSETRON HCL 4 MG/2ML IJ SOLN: 4.0000 mg | Freq: Four times a day (QID) | INTRAMUSCULAR | Status: DC | PRN

## 2012-08-30 MED ORDER — ASPIRIN EC 81 MG PO TBEC
81.0000 mg | DELAYED_RELEASE_TABLET | Freq: Every day | ORAL | Status: DC
  Filled 2012-08-30: qty 1

## 2012-08-30 MED ORDER — ASPIRIN EC 81 MG PO TBEC
81.0000 mg | DELAYED_RELEASE_TABLET | Freq: Every day | ORAL | Status: DC
  Administered 2012-08-30 – 2012-09-01 (×3): 81 mg via ORAL
  Filled 2012-08-30 (×3): qty 1

## 2012-08-30 MED ORDER — FUROSEMIDE 40 MG PO TABS
40.0000 mg | ORAL_TABLET | Freq: Every day | ORAL | Status: DC
  Administered 2012-08-30 – 2012-09-01 (×3): 40 mg via ORAL
  Filled 2012-08-30 (×3): qty 1

## 2012-08-30 MED ORDER — ISOSORBIDE MONONITRATE ER 60 MG PO TB24
60.0000 mg | ORAL_TABLET | Freq: Two times a day (BID) | ORAL | Status: DC
Start: 2012-08-30 — End: 2012-08-30
  Filled 2012-08-30 (×2): qty 1

## 2012-08-30 MED ORDER — LEVOTHYROXINE SODIUM 50 MCG PO TABS
50.0000 ug | ORAL_TABLET | Freq: Every day | ORAL | Status: DC
  Administered 2012-08-30 – 2012-09-01 (×3): 50 ug via ORAL
  Filled 2012-08-30 (×3): qty 1

## 2012-08-30 MED ORDER — SODIUM CHLORIDE 0.9 % IJ SOLN
3.0000 mL | Freq: Two times a day (BID) | INTRAMUSCULAR | Status: DC
  Administered 2012-08-30 – 2012-09-01 (×4): 3 mL via INTRAVENOUS

## 2012-08-30 MED ORDER — NITROGLYCERIN 0.4 MG SL SUBL: 0.4000 mg | SUBLINGUAL_TABLET | SUBLINGUAL | Status: DC | PRN

## 2012-08-30 MED ORDER — SODIUM CHLORIDE 0.9 % IJ SOLN
3.0000 mL | Freq: Two times a day (BID) | INTRAMUSCULAR | Status: DC
  Administered 2012-08-30: 3 mL via INTRAVENOUS

## 2012-08-30 MED ORDER — FUROSEMIDE 20 MG PO TABS
60.0000 mg | ORAL_TABLET | Freq: Every day | ORAL | Status: DC
  Filled 2012-08-30: qty 1

## 2012-08-30 MED ORDER — PANTOPRAZOLE SODIUM 40 MG PO TBEC
40.0000 mg | DELAYED_RELEASE_TABLET | Freq: Every day | ORAL | Status: DC
  Administered 2012-08-30 – 2012-09-01 (×3): 40 mg via ORAL
  Filled 2012-08-30 (×3): qty 1

## 2012-08-30 MED ORDER — ISOSORBIDE MONONITRATE ER 60 MG PO TB24
60.0000 mg | ORAL_TABLET | Freq: Every day | ORAL | Status: DC
  Administered 2012-08-30 – 2012-09-01 (×3): 60 mg via ORAL
  Filled 2012-08-30 (×3): qty 1

## 2012-08-30 MED ORDER — ASPIRIN 81 MG PO TABS: 81.0000 mg | ORAL_TABLET | Freq: Every day | ORAL | Status: DC

## 2012-08-30 MED ORDER — SIMVASTATIN 20 MG PO TABS
20.0000 mg | ORAL_TABLET | Freq: Every day | ORAL | Status: DC
  Administered 2012-08-30 – 2012-08-31 (×2): 20 mg via ORAL
  Filled 2012-08-30 (×3): qty 1

## 2012-08-30 MED ORDER — ACETAMINOPHEN 325 MG PO TABS: 650.0000 mg | ORAL_TABLET | Freq: Four times a day (QID) | ORAL | Status: DC | PRN

## 2012-08-30 MED ORDER — ONDANSETRON HCL 4 MG PO TABS: 4.0000 mg | ORAL_TABLET | Freq: Four times a day (QID) | ORAL | Status: DC | PRN

## 2012-08-30 MED ORDER — HYDROCODONE-ACETAMINOPHEN 5-325 MG PO TABS
1.0000 | ORAL_TABLET | ORAL | Status: DC | PRN
  Administered 2012-08-30 – 2012-08-31 (×2): 1 via ORAL
  Filled 2012-08-30 (×2): qty 1

## 2012-08-30 NOTE — Plan of Care (Signed)
Problem: Phase I Progression Outcomes Goal: Pain controlled with appropriate interventions Outcome: Completed/Met Date Met:  08/30/12 Pt has had no c/o pain, goal met, will continue to monitor  Goal: OOB as tolerated unless otherwise ordered Outcome: Completed/Met Date Met:  08/30/12 Pt oob ad lib, no c/o dizziness or sob, will continue to monitor Goal: Initial discharge plan identified Outcome: Completed/Met Date Met:  08/30/12 pts initial plan for d/c is to go home with husband, goal met, will continue to assess Goal: Voiding-avoid urinary catheter unless indicated Outcome: Completed/Met Date Met:  08/30/12 Pt on bathroom privileges, voids adequate amounts, no need for foley, goal met Goal: Other Phase I Outcomes/Goals Outcome: Completed/Met Date Met:  08/30/12 Pt comes in with syncope, no c/o dizziness, c/p, or sob, pt brady's into the 40's at time, pt asymptomatic, vss, will continue to monitor

## 2012-08-30 NOTE — ED Notes (Signed)
Per EMS, near syncopal event. Pt led herself to floor, and denies fall. Son found pt in floor approx 0240. Pt having pressure under L breast, numbness in L jaw, and L arm pain. Pt has hx of 2 previous MI. Aspirin, and 3 nitro sublingual en route to [facility] ED. 22G in R hand. Edema present in bilaterally in lower extremities, pt does have hx of HF. Clear and equal BS bilateral. VS: BP 168/88 HR 60 RR 18.

## 2012-08-30 NOTE — Progress Notes (Signed)
[facility] PROGRESS NOTE  Victoria Gomez ZOX:[id number] DOB: 1925/02/14 DOA: 08/30/2012 PCP: Carrie Mew, MD  Assessment/Plan: #1. Syncope with sinus bradycardia - patient's EKG shows sinus bradycardia with LBBB. LBBB is chronic. Appreciate cards assistance. Bystolic discontinued. + orthostatics. Lasix  Dose decreased and imdur decreased as well per cards.  Closely observe in telemetry. HR range 40-50 #2. CAD status post stenting - denies any chest pain at this time. On aspirin.  #3. History of diastolic CHF last EF measured October 2012 was 55-60% - see #1. Presently not short of breath.  #4. Hypertension - SBP range 116-178. Will recheck orthostatic blood pressures. In am given med changes.  See #1.  #5. Hypothyroidism - continue Synthroid. Check TSH.     Code Status: full Family Communication: Pt and husband at bedside Disposition Plan: home when medically stable   Consultants:  cards  Procedures:  none  Antibiotics:  none  HPI/Subjective: Sitting up in bed eating breakfast. NAD. Denies pain/dizziness  Objective: Filed Vitals:   08/30/12 0619 08/30/12 0630 08/30/12 0700 08/30/12 0747  BP: 127/51 139/51 116/60 178/70  Pulse:  48 59 45  Temp:    98.2 F (36.8 C)  TempSrc:    Oral  Resp:  14 18 20   Weight:    73.8 kg (162 lb 11.2 oz)  SpO2:  99% 100% 99%   No intake or output data in the 24 hours ending 08/30/12 0956 Filed Weights   08/30/12 0747  Weight: 73.8 kg (162 lb 11.2 oz)    Exam:   General:  Awake alert oriented x3.   Cardiovascular: regular, bradycardia. No LEE  Respiratory: normal effort. BSCTAB No wheeze, rhonchi  Abdomen: soft +BS non-tender to palpation  Data Reviewed: Basic Metabolic Panel:  Lab 08/30/12 8119  NA 142  K 3.4*  CL 106  CO2 28  GLUCOSE 126*  BUN 12  CREATININE 1.28*  CALCIUM 8.5  MG 2.1  PHOS --   Liver Function Tests: No results found for this basename:  AST:5,ALT:5,ALKPHOS:5,BILITOT:5,PROT:5,ALBUMIN:5 in the last 168 hours No results found for this basename: LIPASE:5,AMYLASE:5 in the last 168 hours No results found for this basename: AMMONIA:5 in the last 168 hours CBC:  Lab 08/30/12 0845 08/30/12 0440  WBC 5.0 5.7  NEUTROABS -- --  HGB 11.4* 11.5*  HCT 35.2* 35.3*  MCV 86.7 86.7  PLT 171 169   Cardiac Enzymes:  Lab 08/30/12 0845 08/30/12 0437  CKTOTAL -- --  CKMB -- --  CKMBINDEX -- --  TROPONINI <0.30 <0.30   BNP (last 3 results)  Basename 08/30/12 0437 03/01/12 0959 11/24/11 1426  PROBNP 346.3 141.0* 138.0*   CBG: No results found for this basename: GLUCAP:5 in the last 168 hours  No results found for this or any previous visit (from the past 240 hour(s)).   Studies: Dg Chest 2 View  08/30/2012  *RADIOLOGY REPORT*  Clinical Data: Left-sided chest pain  CHEST - 2 VIEW  Comparison: 09/08/2011  Findings: Heart size upper normal to mildly enlarged.  Chronic interstitial markings and central bronchitic changes.  No new airspace opacity, pleural effusion, or pneumothorax.  Diffuse osteopenia with mild compression of a lower thoracic vertebrae is similar to prior.  Surgical clips project over the upper abdomen.  IMPRESSION: Chronic interstitial markings.  No confluent airspace opacity.  Heart size upper normal to mildly enlarged.  Similar to prior.   Original Report Authenticated By: Waneta Martins, M.D.     Scheduled Meds:   . amLODipine  5 mg Oral Daily   And  . irbesartan  300 mg Oral Daily  . aspirin EC  81 mg Oral Daily  . enoxaparin (LOVENOX) injection  40 mg Subcutaneous Q24H  . furosemide  40 mg Oral Daily  . isosorbide mononitrate  60 mg Oral Daily  . levothyroxine  50 mcg Oral Daily  . pantoprazole  40 mg Oral Daily  . pentosan polysulfate  100 mg Oral TID AC  . potassium chloride  10 mEq Oral Daily  . simvastatin  20 mg Oral q1800  . sodium chloride  3 mL Intravenous Q12H  . sodium chloride  3 mL  Intravenous Q12H  . DISCONTD: amLODipine-valsartan  1 tablet Oral Daily  . DISCONTD: aspirin EC  81 mg Oral Daily  . DISCONTD: aspirin  81 mg Oral Daily  . DISCONTD: furosemide  60 mg Oral Daily  . DISCONTD: isosorbide mononitrate  60 mg Oral BID   Continuous Infusions:   Principal Problem:  *Syncope Active Problems:  HYPERTENSION  CORONARY ARTERY DISEASE  Diastolic HF (heart failure)    Time spent: 30 minutes    [facility] M NP [facility]  If 8PM-8AM, please contact night-coverage at [email or web address], password [facility] 08/30/2012, 9:56 AM  LOS: 0 days

## 2012-08-30 NOTE — Progress Notes (Signed)
UR done. 

## 2012-08-30 NOTE — ED Notes (Signed)
Patient and family members given warm blankets.

## 2012-08-30 NOTE — H&P (Signed)
Victoria Gomez is an 78 y.o. female.   Patient was seen and examined on August 30, 2012. PCP - Dr. Darryll Capers. Cardiologist - Dr. Rollene Rotunda. Chief Complaint: Loss of consciousness. HPI: 78 year-old female with history of CAD status post stenting, hypothyroidism, diastolic CHF last EF measured in October 2012 was 55-60% was brought to the ER after patient had an episode of loss of consciousness. Patient had waken up at around 2 AM today to open the to for her son. After which patient felt little dizzy and she passed out. She does not recall loosing consciousness. Her husband who was by her side states that she begin her consciousness spontaneously after a few minutes. Has not had any focal deficits. Patient did not have any shortness of breath or chest pain, palpitations prior or after the incident. Patient did not have any incontinence of urine, tongue bite or any seizure-like activities.  Patient was recently found to be having bradycardia. Patient's cardiologist had decreased her by starting dose to half. In the ER patient was found to have heart rate around 44-48 per minute in sinus. Patient has been admitted for further observation.  Past Medical History  Diagnosis Date  . HYPERLIPIDEMIA 03/07/2008  . HYPERTENSION 08/03/2007  . CORONARY ARTERY DISEASE 08/03/2007  . Acute on chronic systolic heart failure 05/23/2009  . HYPOTHYROIDISM 08/03/2007  . Esophageal reflux 10/18/2007  . DIVERTICULOSIS, COLON 08/03/2007  . DISORDER, BLADDER NEC 08/03/2007  . BREAST MASS, BENIGN 05/23/2009  . Edema 02/19/2009  . BARRETT'S ESOPHAGUS, HX OF 08/03/2007  . Interstitial cystitis   . Arthritis   . Internal hemorrhoids   . Epistaxis   . Right hand fracture   . Diarrhea   . Bladder disorder     Past Surgical History  Procedure Date  . Breast surgery     benign mass  . Cholecystectomy   . Cesarean section   . Appendectomy   . Cataract extraction, bilateral   . Tonsillectomy   . Abdominal  hysterectomy   . Ankle reconstruction     Left  . Wrist reconstruction     Right    Family History  Problem Relation Age of Onset  . Colon cancer Mother   . Coronary artery disease Mother   . Heart disease Mother   . Coronary artery disease Father   . Heart disease Father   . Colon polyps Sister   . Coronary artery disease Sister   . Coronary artery disease Brother   . Diabetes type II Son    Social History:  reports that she quit smoking about 42 years ago. Her smoking use included Cigarettes. She has a 15 pack-year smoking history. She has never used smokeless tobacco. She reports that she does not drink alcohol or use illicit drugs.  Allergies:  Allergies  Allergen Reactions  . Codeine   . Sulfonamide Derivatives      (Not in a hospital admission)  Results for orders placed during the hospital encounter of 08/30/12 (from the past 48 hour(s))  PRO B NATRIURETIC PEPTIDE     Status: Normal   Collection Time   08/30/12  4:37 AM      Component Value Range Comment   Pro B Natriuretic peptide (BNP) 346.3  0 - 450 pg/mL   TROPONIN I     Status: Normal   Collection Time   08/30/12  4:37 AM      Component Value Range Comment   Troponin I <0.30  <0.30 ng/mL  CBC     Status: Abnormal   Collection Time   08/30/12  4:40 AM      Component Value Range Comment   WBC 5.7  4.0 - 10.5 K/uL    RBC 4.07  3.87 - 5.11 MIL/uL    Hemoglobin 11.5 (*) 12.0 - 15.0 g/dL    HCT 16.1 (*) 09.6 - 46.0 %    MCV 86.7  78.0 - 100.0 fL    MCH 28.3  26.0 - 34.0 pg    MCHC 32.6  30.0 - 36.0 g/dL    RDW 04.5  40.9 - 81.1 %    Platelets 169  150 - 400 K/uL   MAGNESIUM     Status: Normal   Collection Time   08/30/12  4:40 AM      Component Value Range Comment   Magnesium 2.1  1.5 - 2.5 mg/dL   BASIC METABOLIC PANEL     Status: Abnormal   Collection Time   08/30/12  4:40 AM      Component Value Range Comment   Sodium 142  135 - 145 mEq/L    Potassium 3.4 (*) 3.5 - 5.1 mEq/L    Chloride 106   96 - 112 mEq/L    CO2 28  19 - 32 mEq/L    Glucose, Bld 126 (*) 70 - 99 mg/dL    BUN 12  6 - 23 mg/dL    Creatinine, Ser 9.14 (*) 0.50 - 1.10 mg/dL    Calcium 8.5  8.4 - 78.2 mg/dL    GFR calc non Af Amer 37 (*) >90 mL/min    GFR calc Af Amer 43 (*) >90 mL/min    Dg Chest 2 View  08/30/2012  *RADIOLOGY REPORT*  Clinical Data: Left-sided chest pain  CHEST - 2 VIEW  Comparison: 09/08/2011  Findings: Heart size upper normal to mildly enlarged.  Chronic interstitial markings and central bronchitic changes.  No new airspace opacity, pleural effusion, or pneumothorax.  Diffuse osteopenia with mild compression of a lower thoracic vertebrae is similar to prior.  Surgical clips project over the upper abdomen.  IMPRESSION: Chronic interstitial markings.  No confluent airspace opacity.  Heart size upper normal to mildly enlarged.  Similar to prior.   Original Report Authenticated By: Waneta Martins, M.D.     Review of Systems  Constitutional: Negative.   HENT: Negative.   Eyes: Negative.   Respiratory: Negative.   Cardiovascular: Negative.   Gastrointestinal: Negative.   Genitourinary: Negative.   Musculoskeletal: Negative.   Skin: Negative.   Neurological: Positive for loss of consciousness.  Endo/Heme/Allergies: Negative.   Psychiatric/Behavioral: Negative.     Blood pressure 127/51, pulse 53, temperature 98.6 F (37 C), temperature source Oral, resp. rate 14, SpO2 99.00%. Physical Exam  Constitutional: She is oriented to person, place, and time. She appears well-developed and well-nourished. No distress.  HENT:  Head: Normocephalic.  Right Ear: External ear normal.  Left Ear: External ear normal.  Nose: Nose normal.  Mouth/Throat: Oropharynx is clear and moist. No oropharyngeal exudate.  Eyes: Conjunctivae normal are normal. Pupils are equal, round, and reactive to light. Right eye exhibits no discharge. Left eye exhibits no discharge. No scleral icterus.  Neck: Normal range of  motion. Neck supple.  Cardiovascular:       Sinus bradycardia.  Respiratory: Effort normal and breath sounds normal. No respiratory distress. She has no wheezes. She has no rales.  GI: Soft. Bowel sounds are normal. She exhibits no distension. There  is no tenderness. There is no rebound and no guarding.  Musculoskeletal: She exhibits no edema and no tenderness.  Neurological: She is alert and oriented to person, place, and time.       Moves all extremities.  Skin: Skin is warm and dry. She is not diaphoretic.     Assessment/Plan #1. Syncope with sinus bradycardia - patient's EKG shows sinus bradycardia with LBBB. LBBB is chronic. At this time we will discontinue Bystolic given her bradycardia. We'll check orthostatic blood pressure and if patient orthostatic then we will need to discontinue Lasix probably contributing to her syncope. I have consulted cardiology for further recommendations. Closely observe in telemetry. #2. CAD status post stenting - denies any chest pain at this time. On aspirin. #3. History of diastolic CHF last EF measured October 2012 was 55-60% - see #1. Presently not short of breath. #4. Hypertension - check orthostatic blood pressures. See #1.  #5. Hypothyroidism - continue Synthroid. Check TSH.  Patient has mild hypokalemia for which I have ordered repeat metabolic panel and magnesium levels.  CODE STATUS - full code.  Eduard Clos. 08/30/2012, 6:55 AM

## 2012-08-30 NOTE — Progress Notes (Signed)
Patient seen and examined by me.  Appreciate cardiology's input.  Medications adjusted and will continue to monitor on tele.   Marlin Canary DO

## 2012-08-30 NOTE — ED Provider Notes (Signed)
History     CSN: [id number]  Arrival date & time 08/30/12  3086   First MD Initiated Contact with Patient 08/30/12 [phone]      Chief Complaint  Patient presents with  . Chest Pain    (Consider location/radiation/quality/duration/timing/severity/associated sxs/prior treatment) Patient is a 78 y.o. female presenting with chest pain.  Chest Pain Primary symptoms include fatigue. Pertinent negatives for primary symptoms include no shortness of breath.    78 year old female presents to the emergency department via EMS from home with report of syncope, chest pain. Patient reports her son called her to unlock the door, she recorded walking to the door unlocking it, but then felt "funny" on her way back to the bedroom, she felt as though she was going to pass out and so try to sit down the couch but slid down onto the floor instead. Patient denies striking her head. She has no headache or head pain. Husband reports he was called soon after by their son who found his mother on the floor unconscious. Total time unconscious about a minute or 2 per patient, and family. A she denies previous history of syncope. After arousing from being unconscious, patient reports total body numbness. This is not described as loss of sensation or tingling, she cannot describe the sensation. When this wore off she developed left-sided chest pain under her breast also with some pain in her left arm. She had some mild shortness of breath. All pain resolved after nitroglycerin. Patient reports she has occasional chest pain maybe once or twice a month that is usually relieved with nitroglycerin. She has a history of coronary disease, CHF Dr. Antoine Poche is her cardiologist, Dr. Lovell Sheehan is her primary care Dr. She denies any symptoms at present. She reports her weight has been steady. She has noticed increasing swelling in her lower legs. She denies missing any medications. She admits to sometimes not sticking to her low-salt  diet.  Past Medical History  Diagnosis Date  . HYPERLIPIDEMIA 03/07/2008  . HYPERTENSION 08/03/2007  . CORONARY ARTERY DISEASE 08/03/2007  . Acute on chronic systolic heart failure 05/23/2009  . HYPOTHYROIDISM 08/03/2007  . Esophageal reflux 10/18/2007  . DIVERTICULOSIS, COLON 08/03/2007  . DISORDER, BLADDER NEC 08/03/2007  . BREAST MASS, BENIGN 05/23/2009  . Edema 02/19/2009  . BARRETT'S ESOPHAGUS, HX OF 08/03/2007  . Interstitial cystitis   . Arthritis   . Internal hemorrhoids   . Epistaxis   . Right hand fracture   . Diarrhea   . Bladder disorder     Past Surgical History  Procedure Date  . Breast surgery     benign mass  . Cholecystectomy   . Cesarean section   . Appendectomy   . Cataract extraction, bilateral   . Tonsillectomy   . Abdominal hysterectomy   . Ankle reconstruction     Left  . Wrist reconstruction     Right    Family History  Problem Relation Age of Onset  . Colon cancer Mother   . Coronary artery disease Mother   . Heart disease Mother   . Coronary artery disease Father   . Heart disease Father   . Colon polyps Sister   . Coronary artery disease Sister   . Coronary artery disease Brother   . Diabetes type II Son     History  Substance Use Topics  . Smoking status: Former Smoker -- 1.0 packs/day for 15 years    Types: Cigarettes    Quit date: 11/10/1969  . Smokeless  tobacco: Never Used  . Alcohol Use: No    OB History    Grav Para Term Preterm Abortions TAB SAB Ect Mult Living                  Review of Systems  Constitutional: Positive for fatigue.  Respiratory: Negative for shortness of breath.   Cardiovascular: Positive for chest pain and leg swelling.  Neurological: Positive for syncope.  All other systems reviewed and are negative.    Allergies  Codeine and Sulfonamide derivatives  Home Medications   Current Outpatient Rx  Name Route Sig Dispense Refill  . AMLODIPINE BESYLATE-VALSARTAN 5-320 MG PO TABS Oral Take 1 tablet  by mouth daily. 90 tablet 3  . ASPIRIN 81 MG PO TABS Oral Take 81 mg by mouth daily.      [name] CALCIUM CARBONATE-VITAMIN D 600-200 MG-UNIT PO TABS Oral Take by mouth 2 (two) times daily.      . FUROSEMIDE 40 MG PO TABS Oral Take 1.5 tablets (60 mg total) by mouth daily. 140 tablet 3  . HYDROCODONE-ACETAMINOPHEN 5-325 MG PO TABS Oral Take 1 tablet by mouth every 4 (four) hours as needed for pain. 40 tablet 0    NO REFILLS UNTIL COMES IN FOR OV  . ISOSORBIDE MONONITRATE ER 60 MG PO TB24 Oral Take 1 tablet (60 mg total) by mouth 2 (two) times daily. Pt request [place] tabs 180 tablet 3  . KLOR-CON 10 10 MEQ PO TBCR  TAKE 1 TABLET DAILY 90 each 3  . LANSOPRAZOLE 30 MG PO CPDR Oral Take 1 capsule (30 mg total) by mouth 2 (two) times daily. 180 capsule 3  . ONE-DAILY MULTI VITAMINS PO TABS Oral Take 1 tablet by mouth daily.      . NEBIVOLOL HCL 2.5 MG PO TABS Oral Take 1 tablet (2.5 mg total) by mouth daily. 30 tablet 3  . NITROGLYCERIN 0.4 MG SL SUBL Sublingual Place 1 tablet (0.4 mg total) under the tongue every 5 (five) minutes as needed. If pain after 3 pills- call 911 90 tablet 3  . PENTOSAN POLYSULFATE SODIUM 100 MG PO CAPS Oral Take 100 mg by mouth 3 (three) times daily before meals.      [name] PRAVASTATIN SODIUM 40 MG PO TABS Oral Take 1 tablet (40 mg total) by mouth daily. Pt request [place] tabs 90 tablet 3  . SYNTHROID 50 MCG PO TABS  TAKE 1 TABLET DAILY 90 tablet 3    BP 153/54  Pulse 53  Temp 98.6 F (37 C) (Oral)  Resp 20  SpO2 99%  Physical Exam  Nursing note and vitals reviewed. Constitutional: She appears well-developed and well-nourished. No distress.  HENT:  Head: Normocephalic and atraumatic.  Nose: Nose normal.  Mouth/Throat: Oropharynx is clear and moist. No oropharyngeal exudate.  Eyes: Conjunctivae normal are normal. Pupils are equal, round, and reactive to light.  Neck: Normal range of motion. Neck supple. No JVD present. No tracheal deviation present. No thyromegaly  present.  Cardiovascular: Regular rhythm, normal heart sounds and intact distal pulses.  Exam reveals no gallop and no friction rub.   No murmur heard.      Bradycardia noted  Pulmonary/Chest: Effort normal. No respiratory distress. She has no wheezes. She has no rales. She exhibits no tenderness.       Breath sounds diminished in the bases bilaterally  Abdominal: Soft. Bowel sounds are normal. She exhibits no distension. There is no tenderness. There is no rebound and no guarding.  Musculoskeletal: She exhibits edema (2+ pitting edema bilateral lower extremities). She exhibits no tenderness.  Lymphadenopathy:    She has no cervical adenopathy.  Skin: Skin is warm and dry. No rash noted. No erythema. No pallor.    ED Course  Procedures (including critical care time)   Labs Reviewed  CBC  PRO B NATRIURETIC PEPTIDE  MAGNESIUM  BASIC METABOLIC PANEL  TROPONIN I   No results found.   Date: 08/30/2012  Rate: 51  Rhythm: sinus bradycardia  QRS Axis: left  Intervals: normal  ST/T Wave abnormalities: normal  Conduction Disutrbances:left bundle branch block  Narrative Interpretation:   Old EKG Reviewed: unchanged    1. Syncope   2. Bradycardia   3. CHF (congestive heart failure)       MDM  78 year old female with syncopal event with history of CAD and CHF. Patient had brief episode of chest pain now resolved after nitroglycerin. Will discuss with hospitalist for admission, we'll need cardiology consult either now or in the morning.        Olivia Mackie, MD 08/30/12 [phone]

## 2012-08-30 NOTE — Consult Note (Signed)
CARDIOLOGY CONSULT NOTE  Patient ID: Victoria Gomez, MRN: [id number], DOB/AGE: [birth year] 78 y.o. Admit date: 08/30/2012 Date of Consult: 08/30/2012  Primary Physician: Carrie Mew, MD Primary Cardiologist: Dr. Antoine Poche  Chief Complaint: syncope Reason for Consultation: syncope and bradycardia  HPI: 78 y.o. female w/ PMHx significant for CAD (s/p PCI to RCA '01, repeat PCI to RCA '03 2/2 ISR), Chronic Combined Systolic & Diastolic CHF, Stage 3 CKD (baseline Crt ~1.0-1.3), HTN, HLD, and Hypothyroidism who presented to [facility] on 08/30/2012 after a syncopal episode.  Cath 2012 showed severe single vessel CAD of the RCA and otherwise nonobstructive dz in the LCx and LAD, treated medically. Echo 2012 showed mild LVH, EF 55-60%, grade 2 diastolic dysfunction, mild-mod LAE. Her PCP increased her Lasix from 40-60mg  daily in March due to shortness of breath and cough. Last clinic visit with Dr. Antoine Poche 04/2012 she noted fatigue for which her Bystolic was reduced from 5mg  to 2.5mg  daily. At that visit her heart rate was 49bpm.  She reports being in her usual state of health until this morning when she got out of bed around 2am to let her son inside. She noted letting him in and then the next thing she remembers she was in the sunroom and began feeling "woozy" and numb all over. She reached for the couch and slumped to the ground. Her husband thinks she lost consciousness for ~35mins. When she regained consciousness she felt woozy and weak all over, but no confusion or focal neuro deficits. Denies injuries, incontinence, or seizure-like activity. Denies preceding chest pain, palpitations, or sob. Denies h/o syncope. Did have tachypalpitations in the past, but has not experienced this for about 6mos. She has otherwise been doing well without weight gain (baseline wt ~161lbs), change in sob or orthopnea, LE edema, change in bladder or bowels. She rode from [place] to [place] on 10/10 which is  about a 4hr car ride. Reports mild left leg pain while in [place] a few weeks ago, but none since that time and no worsening of sob.   In the ED, EKG showed sinus rhythm w/ 1st degree AVB 51bpm, chronic LBBB. CXR showed no acute cardiopulmonary findings. Labs are significant for normal troponin, pBNP 346, Hgb 11.5, K+3.4, Glucose 126, BUN/Crt 12/1.28, otherwise unremarkable BMET/CBC. Vital signs are remarkable for heart rates in the 40-50s and orthostatic hypotension (139/51 48bpm --> 116/60 59bpm). She is not hypoxic, tachypneic, or tachycardic.   Past Medical History  Diagnosis Date  . HYPERLIPIDEMIA 03/07/2008  . HYPERTENSION 08/03/2007  . CORONARY ARTERY DISEASE 08/03/2007  . Acute on chronic systolic heart failure 05/23/2009  . HYPOTHYROIDISM 08/03/2007  . Esophageal reflux 10/18/2007  . DIVERTICULOSIS, COLON 08/03/2007  . DISORDER, BLADDER NEC 08/03/2007  . BREAST MASS, BENIGN 05/23/2009  . Edema 02/19/2009  . BARRETT'S ESOPHAGUS, HX OF 08/03/2007  . Interstitial cystitis   . Arthritis   . Internal hemorrhoids   . Epistaxis   . Right hand fracture   . Diarrhea   . Bladder disorder      09/10/11 - Echo Study Conclusions: - Left ventricle: The cavity size was normal. Wall thickness was increased in a pattern of mild LVH. Systolic function was normal. The estimated ejection fraction was in the range of 55% to 60%. Features are consistent with a pseudonormal left ventricular filling pattern, with concomitant abnormal relaxation and increased filling pressure (grade 2 diastolic dysfunction). Doppler parameters are consistent with high ventricular filling pressure. - Aortic valve: Trivial regurgitation. - Mitral  valve: Calcified annulus. - Left atrium: The atrium was mildly to moderately dilated. - Right ventricle: The cavity size was normal. Wall thickness was mildly increased.   01/31/11 - Cardiac Cath Hemodynamics: LV 156/12, AO 155/95.  Coronaries: Left main was normal.  The LAD had  proximal circumferential 40% stenosis. There was mid 25% stenosis. First diagonal was small with ostial 80% stenosis.  The circumflex was a large vessel. In the AV groove, there is 25% stenosis. First obtuse marginal was large and branching. There was proximal 25% stenosis. In the inferior branch, there was long 60% stenosis.  Right coronary artery was occluded at the ostium. It was noted to fill via collaterals and there was not high-grade disease visualized in the PDA.  Left ventriculogram. The left ventriculogram was obtained in the RAO projection. The EF was 55% with mild inferior hypokinesis.  CONCLUSION: Severe single-vessel coronary artery disease. Nonobstructive disease elsewhere.  Surgical History:  Past Surgical History  Procedure Date  . Breast surgery     benign mass  . Cholecystectomy   . Cesarean section   . Appendectomy   . Cataract extraction, bilateral   . Tonsillectomy   . Abdominal hysterectomy   . Ankle reconstruction     Left  . Wrist reconstruction     Right     Home Meds: Medication Sig  amLODipine-valsartan (EXFORGE) 5-320 MG per tablet Take 1 tablet by mouth daily.  aspirin 81 MG tablet Take 81 mg by mouth daily.    Calcium Carbonate-Vitamin D (CALCIUM 600+D) 600-200 MG-UNIT TABS Take by mouth 2 (two) times daily.    furosemide (LASIX) 40 MG tablet Take 1.5 tablets (60 mg total) by mouth daily.  HYDROcodone-acetaminophen (NORCO) 5-325 MG per tablet Take 1 tablet by mouth every 4 (four) hours as needed for pain.  isosorbide mononitrate (IMDUR) 60 MG 24 hr tablet Take 1 tablet (60 mg total) by mouth 2 (two) times daily. Pt request west-wards tabs  lansoprazole (PREVACID) 30 MG capsule Take 1 capsule (30 mg total) by mouth 2 (two) times daily.  levothyroxine (SYNTHROID, LEVOTHROID) 50 MCG tablet Take 50 mcg by mouth daily.  Multiple Vitamin (MULTIVITAMIN) tablet Take 1 tablet by mouth daily.    nebivolol (BYSTOLIC) 2.5 MG tablet Take 1 tablet (2.5 mg total) by  mouth daily.  nitroGLYCERIN (NITROSTAT) 0.4 MG SL tablet Place 1 tablet (0.4 mg total) under the tongue every 5 (five) minutes as needed. If pain after 3 pills- call 911  pentosan polysulfate (ELMIRON) 100 MG capsule Take 100 mg by mouth 3 (three) times daily before meals.    potassium chloride (K-DUR,KLOR-CON) 10 MEQ tablet Take 10 mEq by mouth daily.  pravastatin (PRAVACHOL) 40 MG tablet Take 1 tablet (40 mg total) by mouth daily. Pt request west-wards tabs    Inpatient Medications:  . aspirin EC  81 mg Oral Daily  . aspirin  81 mg Oral Daily  . enoxaparin (LOVENOX) injection  40 mg Subcutaneous Q24H  . furosemide  60 mg Oral Daily  . isosorbide mononitrate  60 mg Oral BID  . levothyroxine  50 mcg Oral Daily  . pantoprazole  40 mg Oral Daily  . pentosan polysulfate  100 mg Oral TID AC  . potassium chloride  10 mEq Oral Daily  . simvastatin  20 mg Oral q1800  . sodium chloride  3 mL Intravenous Q12H  . sodium chloride  3 mL Intravenous Q12H  . DISCONTD: amLODipine-valsartan  1 tablet Oral Daily  Allergies:  Allergies  Allergen Reactions  . Codeine   . Sulfonamide Derivatives     History   Social History  . Marital Status: Married    Spouse Name: N/A    Number of Children: N/A  . Years of Education: N/A   Occupational History  . retired    Social History Main Topics  . Smoking status: Former Smoker -- 1.0 packs/day for 15 years    Types: Cigarettes    Quit date: 11/10/1969  . Smokeless tobacco: Never Used  . Alcohol Use: No  . Drug Use: No  . Sexually Active: Yes    Birth Control/ Protection: Post-menopausal   Other Topics Concern  . Not on file   Social History Narrative  . No narrative on file     Family History  Problem Relation Age of Onset  . Colon cancer Mother   . Coronary artery disease Mother   . Heart disease Mother   . Coronary artery disease Father   . Heart disease Father   . Colon polyps Sister   . Coronary artery disease Sister     . Coronary artery disease Brother   . Diabetes type II Son      Review of Systems: General: negative for chills, fever, night sweats or weight changes.  Cardiovascular: negative for chest pain, shortness of breath, dyspnea on exertion, edema, orthopnea, palpitations, or paroxysmal nocturnal dyspnea Dermatological: negative for rash Respiratory: negative for cough or wheezing Urologic: negative for hematuria Abdominal: negative for nausea, vomiting, diarrhea, bright red blood per rectum, melena, or hematemesis Neurologic: As per HPI All other systems reviewed and are otherwise negative except as noted above.  Labs:  [facility] 08/30/12 0437  TROPONINI <0.30     08/30/2012 04:37  Pro B Natriuretic peptide (BNP) 346.3   Component Value Date   WBC 5.7 08/30/2012   HGB 11.5* 08/30/2012   HCT 35.3* 08/30/2012   MCV 86.7 08/30/2012   PLT 169 08/30/2012    Lab 08/30/12 0440  NA 142  K 3.4*  CL 106  CO2 28  BUN 12  CREATININE 1.28*  CALCIUM 8.5  GLUCOSE 126*  MAGENSIUM 2.1    Radiology/Studies:   08/30/2012 - CHEST - 2 VIEW  Findings: Heart size upper normal to mildly enlarged.  Chronic interstitial markings and central bronchitic changes.  No new airspace opacity, pleural effusion, or pneumothorax.  Diffuse osteopenia with mild compression of a lower thoracic vertebrae is similar to prior.  Surgical clips project over the upper abdomen.  IMPRESSION: Chronic interstitial markings.  No confluent airspace opacity.  Heart size upper normal to mildly enlarged.  Similar to prior.       EKG: 08/30/12 @ 0340 - sinus rhythm w/ 1st degree AVB 51bpm, chronic LBBB  Physical Exam: Blood pressure 178/70, pulse 45, temperature 98.2 F (36.8 C), temperature source Oral, resp. rate 20, weight 162 lb 11.2 oz (73.8 kg), SpO2 99.00%. General: Well developed, elderly white female in no acute distress. Head: Normocephalic, atraumatic, sclera non-icteric, no xanthomas, nares are without  discharge.  Neck: Supple. Negative for carotid bruits. No JVD. Lungs: Clear bilaterally to auscultation without wheezes, rales, or rhonchi. Breathing is unlabored. Heart: Bradycardic with S1 S2. No murmurs, rubs, or gallops appreciated. Abdomen: Soft, non-tender, non-distended with normoactive bowel sounds.  No rebound/guarding. No obvious abdominal masses. Msk:  Strength and tone appear normal for age. Extremities: No clubbing or cyanosis. No edema.  Distal pedal pulses are intact and equal bilaterally. No palpable cord  or erythema on bilat calves Neuro: Alert and oriented X 3. Moves all extremities spontaneously. Psych:  Responds to questions appropriately with a normal affect.   Assessment and Plan:  78 y.o. female w/ PMHx significant for CAD (s/p PCI to RCA '01, repeat PCI to RCA '03 2/2 ISR), Chronic Combined Systolic & Diastolic CHF, HTN, HLD, and Hypothyroidism who presented to [facility] on 08/30/2012 after a syncopal episode.  1. Syncope 2. Bradycardia 3. Orthostatic Hypotension 4. Chronic Combined Systolic & Diastolic CHF 5. Coronary Artery Disease 6. Hypertension 7. Hypothyroidism  Patient presents after a syncopal episode this morning while walking. She has known conduction disease with LBBB, 1st degree AVB, and bradycardia, but no prior history of syncope. Also reports a history of tachypalpitations. Lasix was recently increased and Bystolic decreased. She has had improvement in fatigue, but does report dizziness when standing up. (+) orthostatics in the ED. Rates are currently 40-50s on telemetry. Suspect her syncopal episode is from orthostatic hypotension and bradycardia. Will decrease Imdur to once daily, stop Bystolic, and decrease Lasix to 40mg . No signs of volume overload on exam. No anginal symptoms. EKG unchanged. Troponin and BNP normal. TSH pending. She is not hypoxic or tachycardic. No signs of DVT. Low suspicion for PE. Cont to monitor on telemetry. May need  pacemaker in the future if she has recurrent symptomatic bradycardia.   Signed, HOPE, JESSICA PA-C 08/30/2012, 8:28 AM   Patient seen, examined. Available data reviewed. Agree with findings, assessment, and plan as outlined by [facility], PA-C. The patient was independently interviewed and examined. Exam reveals normal JVP, bradycardic heart sounds without murmur or gallop, and no peripheral edema. I have reviewed her telemetry, old EKG's, and all lab/imaging data. She may have had syncope related to hypotension/neurodepressor event, but somewhat concerning that this was a first syncopal episode in an 78 year-old with LBBB, first degree AVB, and marked bradycardia. Will discontinue bisoprolol, monitor on tele, and adjust other meds as outlined above. Dr Antoine Poche to see in the am and decide on EP referral.  Tonny Bollman, M.D. 08/30/2012 9:30 AM

## 2012-08-31 ENCOUNTER — Observation Stay (HOSPITAL_COMMUNITY): Payer: Medicare Other

## 2012-08-31 DIAGNOSIS — Z86718 Personal history of other venous thrombosis and embolism: Secondary | ICD-10-CM | POA: Diagnosis not present

## 2012-08-31 DIAGNOSIS — I498 Other specified cardiac arrhythmias: Secondary | ICD-10-CM | POA: Diagnosis not present

## 2012-08-31 DIAGNOSIS — E039 Hypothyroidism, unspecified: Secondary | ICD-10-CM | POA: Diagnosis not present

## 2012-08-31 DIAGNOSIS — J9819 Other pulmonary collapse: Secondary | ICD-10-CM | POA: Diagnosis not present

## 2012-08-31 DIAGNOSIS — R0602 Shortness of breath: Secondary | ICD-10-CM | POA: Diagnosis not present

## 2012-08-31 DIAGNOSIS — I10 Essential (primary) hypertension: Secondary | ICD-10-CM | POA: Diagnosis not present

## 2012-08-31 DIAGNOSIS — R55 Syncope and collapse: Secondary | ICD-10-CM | POA: Diagnosis not present

## 2012-08-31 LAB — D-DIMER, QUANTITATIVE: D-Dimer, Quant: 0.79 ug/mL-FEU — ABNORMAL HIGH (ref 0.00–0.48)

## 2012-08-31 LAB — BASIC METABOLIC PANEL
Calcium: 9.3 mg/dL (ref 8.4–10.5)
Creatinine, Ser: 1.23 mg/dL — ABNORMAL HIGH (ref 0.50–1.10)
GFR calc Af Amer: 45 mL/min — ABNORMAL LOW (ref >90)
GFR calc non Af Amer: 39 mL/min — ABNORMAL LOW (ref >90)

## 2012-08-31 MED ORDER — TECHNETIUM TO 99M ALBUMIN AGGREGATED
6.0000 | Freq: Once | INTRAVENOUS | Status: AC | PRN
  Administered 2012-08-31: 6 via INTRAVENOUS

## 2012-08-31 MED ORDER — TECHNETIUM TC 99M DIETHYLENETRIAME-PENTAACETIC ACID: 40.0000 | Freq: Once | INTRAVENOUS | Status: AC | PRN

## 2012-08-31 NOTE — Progress Notes (Signed)
PT Cancellation Note  Patient Details Name: Victoria Gomez MRN: [id number] DOB: 05-20-1925   Cancelled Treatment:    Reason Eval/Treat Not Completed: Patient at procedure or test/unavailable (off the floor for vq scan)   [facility] HELEN 08/31/2012, 2:58 PM

## 2012-08-31 NOTE — Progress Notes (Signed)
Occupational Therapy Evaluation Patient Details Name: Victoria Gomez MRN: [id number] DOB: 12-09-1924 Today's Date: 08/31/2012 Time: 1191-4782 OT Time Calculation (min): 17 min  OT Assessment / Plan / Recommendation Clinical Impression  78 yo with CAD who experienced syncopal episode at home. Pt Bradycardic with HR 40s - 60s. Pt c/o dizziness with standing at times since June when her MD changed her medicine.    OT Assessment  Patient does not need any further OT services    Follow Up Recommendations  No OT follow up    Barriers to Discharge      Equipment Recommendations  None recommended by OT    Recommendations for Other Services    Frequency       Precautions / Restrictions Precautions Precautions: Fall Restrictions Weight Bearing Restrictions: No   Pertinent Vitals/Pain No c/o pain. Will complete orthostatics    ADL  Eating/Feeding: Independent Grooming: Independent Upper Body Bathing: Modified independent Lower Body Bathing: Supervision/safety Upper Body Dressing: Modified independent Lower Body Dressing: Modified independent Toilet Transfer: Banker: Comfort height toilet Toileting - Clothing Manipulation and Hygiene: Modified independent Where Assessed - Glass blower/designer Manipulation and Hygiene: Standing Transfers/Ambulation Related to ADLs: S ADL Comments: overall mod Ito S    OT Diagnosis:    OT Problem List:   OT Treatment Interventions:     OT Goals Acute Rehab OT Goals OT Goal Formulation: With patient Time For Goal Achievement: 09/07/12 Potential to Achieve Goals: Good Miscellaneous OT Goals Miscellaneous OT Goal #1: Pt/husband to verbalize home safety modifications and use of DME to increase safety and independence within the home. OT Goal: Miscellaneous Goal #1 - Progress: Goal set today  Visit Information  Assistance Needed: +1    Subjective Data      Prior Functioning     Home Living Lives  With: Spouse;Son (son has cancer) Available Help at Discharge: Available 24 hours/day Type of Home: House Home Access: Stairs to enter [employer] of Steps: 3 Entrance Stairs-Rails: Can reach both Home Layout: One level Bathroom Shower/Tub: Walk-in shower;Door [employer] Toilet: Handicapped height Bathroom Accessibility: Yes How Accessible: Accessible via walker Home Adaptive Equipment: Walker - rolling;Bedside commode/3-in-1 Prior Function Level of Independence: Independent Able to Take Stairs?: Yes Driving: No Vocation: Retired Musician: No difficulties Dominant Hand: Right         Vision/Perception     Cognition  Overall Cognitive Status: Appears within functional limits for tasks assessed/performed Arousal/Alertness: Awake/alert Orientation Level: Appears intact for tasks assessed Behavior During Session: [facility] for tasks performed    Extremity/Trunk Assessment Right Upper Extremity Assessment RUE ROM/Strength/Tone: Within functional levels Left Upper Extremity Assessment LUE ROM/Strength/Tone: Within functional levels Right Lower Extremity Assessment RLE ROM/Strength/Tone: WFL for tasks assessed Left Lower Extremity Assessment LLE ROM/Strength/Tone: [facility] for tasks assessed Trunk Assessment Trunk Assessment: Normal     Mobility Bed Mobility Bed Mobility: Supine to Sit;Sit to Supine Supine to Sit: 7: Independent Sit to Supine: 7: Independent Transfers Transfers: Sit to Stand;Stand to Sit Sit to Stand: 5: Supervision Stand to Sit: 5: Supervision     Shoulder Instructions     Exercise     Balance  S   End of Session OT - End of Session Equipment Utilized During Treatment: Gait belt Activity Tolerance: Patient tolerated treatment well Patient left: in chair;with call bell/phone within reach  GO Functional Assessment Tool Used: clinical judgement Functional Limitation: Self care Self Care Current Status (N5621): At least 1  percent but less than 20 percent  impaired, limited or restricted Self Care Goal Status [phone]): At least 1 percent but less than 20 percent impaired, limited or restricted Self Care Discharge Status [phone]): At least 1 percent but less than 20 percent impaired, limited or restricted   Davien Malone,HILLARY 08/31/2012, 6:01 PM [facility], OTR/L  [phone] 08/31/2012

## 2012-08-31 NOTE — Progress Notes (Signed)
Cardiology Progress Note Patient Name: Victoria Gomez Date of Encounter: 08/31/2012, 7:11 AM     Subjective  No overnight events. Patient reports continued dizziness with walking to the bathroom yesterday, but thinks it is somewhat better. Did not ambulate in the halls. Reports a dull pain "deep" in her chest that is worse with deep inspiration and palpation. Also notes throbbing/sore left leg pain. No shortness of breath   Objective   Telemetry: sinus bradycardia 40-60s  Medications: . amLODipine  5 mg Oral Daily   And  . irbesartan  300 mg Oral Daily  . aspirin EC  81 mg Oral Daily  . enoxaparin (LOVENOX) injection  40 mg Subcutaneous Q24H  . furosemide  40 mg Oral Daily  . isosorbide mononitrate  60 mg Oral Daily  . levothyroxine  50 mcg Oral Daily  . pantoprazole  40 mg Oral Daily  . pentosan polysulfate  100 mg Oral TID AC  . potassium chloride  10 mEq Oral Daily  . simvastatin  20 mg Oral q1800  . sodium chloride  3 mL Intravenous Q12H  . DISCONTD: furosemide  60 mg Oral Daily  . DISCONTD: isosorbide mononitrate  60 mg Oral BID      Physical Exam: Temp:  [97.9 F (36.6 C)-99.3 F (37.4 C)] 97.9 F (36.6 C) (10/22 0449) Pulse Rate:  [45-55] 52  (10/22 0449) Resp:  [18-20] 18  (10/22 0449) BP: (117-178)/(59-80) 117/80 mmHg (10/22 0449) SpO2:  [95 %-99 %] 98 % (10/22 0449) Weight:  [161 lb 6 oz (73.2 kg)-162 lb 11.2 oz (73.8 kg)] 161 lb 6 oz (73.2 kg) (10/22 0449)  General: Well developed, elderly white female in no acute distress.  Head: Normocephalic, atraumatic, sclera non-icteric, no xanthomas, nares are without discharge.  Neck: Supple. Negative for carotid bruits. No JVD.  Lungs: Clear bilaterally to auscultation without wheezes, rales, or rhonchi. Breathing is unlabored. Chest Wall: Tender to palpation Heart: Bradycardic with S1 S2. No murmurs, rubs, or gallops appreciated.  Abdomen: Soft, non-tender, non-distended with normoactive bowel sounds. No  rebound/guarding. No obvious abdominal masses.  Msk: Strength and tone appear normal for age.  Extremities: No clubbing or cyanosis. No edema. Distal pedal pulses are intact and equal bilaterally. No palpable cord or erythema on bilat calves  Neuro: Alert and oriented X 3. Moves all extremities spontaneously.  Psych: Responds to questions appropriately with a normal affect.     Intake/Output Summary (Last 24 hours) at 08/31/12 0711 Last data filed at 08/31/12 0450  Gross per 24 hour  Intake    420 ml  Output    100 ml  Net    320 ml    Labs:  [facility] 08/31/12 0550 08/30/12 0440  NA 144 142  K 3.8 3.4*  CL 106 106  CO2 28 28  GLUCOSE 113* 126*  BUN 17 12  CREATININE 1.23* 1.28*  CALCIUM 9.3 8.5  MG -- 2.1   Basename 08/30/12 0845 08/30/12 0440  WBC 5.0 5.7  NEUTROABS -- --  HGB 11.4* 11.5*  HCT 35.2* 35.3*  MCV 86.7 86.7  PLT 171 169    08/30/2012 04:37   Pro B Natriuretic peptide (BNP)  346.3    Basename 08/30/12 2011 08/30/12 1311 08/30/12 0845 08/30/12 0437  TROPONINI <0.30 <0.30 <0.30 <0.30   Basename 08/30/12 0845  TSH 1.612    Radiology/Studies:   08/30/2012 - CHEST - 2 VIEW  Findings: Heart size upper normal to mildly enlarged. Chronic  interstitial markings and central bronchitic changes. No new airspace opacity, pleural effusion, or pneumothorax. Diffuse osteopenia with mild compression of a lower thoracic vertebrae is similar to prior. Surgical clips project over the upper abdomen. IMPRESSION: Chronic interstitial markings. No confluent airspace opacity. Heart size upper normal to mildly enlarged. Similar to prior.     Assessment and Plan  78 y.o. female w/ PMHx significant for CAD (s/p PCI to RCA '01, repeat PCI to RCA '03 2/2 ISR), Chronic Combined Systolic & Diastolic CHF, Stage 3 CKD (baseline Crt ~1.0-1.3), HTN, HLD, and Hypothyroidism who presented to [facility] on 08/30/2012 after a syncopal episode.  1. Syncope: Syncopal episode  yesterday morning while walking. No injuries. (+) orthostatics on presentation. Rates 40-50s initially. Syncope felt related to orthostatic hypotension and bradycardia. Med changes included: Decrease Imdur to once daily, Stop Bystolic, Decrease Lasix to 40mg  daily. She still has some dizziness with standing and ambulation. May need work up for PE given recent car ride from [place] and complaints of pleuritic chest pain and left leg pain. No shortness of breath or hypoxia. MD please advise on work up (Crt 1.23, baseline ~1-1.3).   2. Bradycardia: She has known conduction disease with LBBB, 1st degree AVB, and bradycardia. Med changes as above. Remains bradycardic with rates mainly 40-50s. Will need to ambulate in the halls today and assess heart rate response and symptoms. May need pacemaker in the future if she has recurrent symptomatic bradycardia.    3. Orthostatic Hypotension: (+) orthostatics on admission. Will check again this morning   4. Chronic Combined Systolic & Diastolic CHF: EF normal by echo 08/2011.  Euvolemic on exam. No edema on CXR. BNP normal.    5. Coronary Artery Disease: Stable. Troponin normal x4.    6. Hypertension: BP stable.    7. Hypothyroidism: TSH normal. Cont Synthroid  Signed, HOPE, JESSICA PA-C  History and all data above reviewed.  Patient examined.  I agree with the findings as above.  I ambulated her in the hallway and her heart rate increased to the 70s.  She was weak and mildly dizzy.  No pain.The patient exam reveals COR:RRR  ,  Lungs: Clear  ,  Abd: Positive bowel sounds, no rebound no guarding, Ext No edema  .  All available labs, radiology testing, previous records reviewed. Agree with documented assessment and plan. I agree with the meds as listed.  I will schedule her for an outpatient Holter. At this point I do not see any further reason for in patient evaluation.    Fayrene Fearing Lancelot Alyea  10:20 AM  08/31/2012

## 2012-08-31 NOTE — Progress Notes (Signed)
[facility] PROGRESS NOTE  Victoria Gomez BJY:[id number] DOB: October 21, 1925 DOA: 08/30/2012 PCP: Carrie Mew, MD  Assessment/Plan: #1. Syncope with sinus bradycardia - patient's EKG shows sinus bradycardia with LBBB. LBBB is chronic. Appreciate cards assistance. Bystolic discontinued. + orthostatics. Lasix  Dose decreased and imdur decreased as well per cards.  Closely observe in telemetry. HR range 40-50- cards to arrange outpatient holter monitor.  Patient had recent long car trip from [place] and is sob with exertion and some dizziness- d-dimer elevated, check v/q scan (not hypoxic) #2. CAD status post stenting - denies any chest pain at this time. On aspirin.  #3. History of diastolic CHF last EF measured October 2012 was 55-60% - see #1. Presently not short of breath.  #4. Hypertension - SBP range 116-178. Will recheck orthostatic blood pressures. In am given med changes.  See #1.  #5. Hypothyroidism - continue Synthroid. Check TSH.     Code Status: full Family Communication: Pt and husband at bedside Disposition Plan: home tomm after PE work up   Consultants:  cards  Procedures:  none  Antibiotics:  none  HPI/Subjective: Sitting up in bed eating breakfast. NAD. Denies pain/dizziness  Objective: Filed Vitals:   08/30/12 1409 08/30/12 2001 08/31/12 0449 08/31/12 0917  BP: 129/59 123/66 117/80 138/60  Pulse: 49 55 52   Temp: 98.3 F (36.8 C) 99.3 F (37.4 C) 97.9 F (36.6 C)   TempSrc: Oral Oral Oral   Resp: 20 19 18    Height:      Weight:   73.2 kg (161 lb 6 oz)   SpO2: 95% 95% 98%     Intake/Output Summary (Last 24 hours) at 08/31/12 1221 Last data filed at 08/31/12 0857  Gross per 24 hour  Intake    660 ml  Output    200 ml  Net    460 ml   Filed Weights   08/30/12 0747 08/31/12 0449  Weight: 73.8 kg (162 lb 11.2 oz) 73.2 kg (161 lb 6 oz)    Exam:   General:  Awake alert oriented x3.   Cardiovascular: regular, bradycardia. No  LEE  Respiratory: normal effort. BSCTAB No wheeze, rhonchi  Abdomen: soft +BS non-tender to palpation  Data Reviewed: Basic Metabolic Panel:  Lab 08/31/12 0865 08/30/12 0440  NA 144 142  K 3.8 3.4*  CL 106 106  CO2 28 28  GLUCOSE 113* 126*  BUN 17 12  CREATININE 1.23* 1.28*  CALCIUM 9.3 8.5  MG -- 2.1  PHOS -- --   Liver Function Tests: No results found for this basename: AST:5,ALT:5,ALKPHOS:5,BILITOT:5,PROT:5,ALBUMIN:5 in the last 168 hours No results found for this basename: LIPASE:5,AMYLASE:5 in the last 168 hours No results found for this basename: AMMONIA:5 in the last 168 hours CBC:  Lab 08/30/12 0845 08/30/12 0440  WBC 5.0 5.7  NEUTROABS -- --  HGB 11.4* 11.5*  HCT 35.2* 35.3*  MCV 86.7 86.7  PLT 171 169   Cardiac Enzymes:  Lab 08/30/12 2011 08/30/12 1311 08/30/12 0845 08/30/12 0437  CKTOTAL -- -- -- --  CKMB -- -- -- --  CKMBINDEX -- -- -- --  TROPONINI <0.30 <0.30 <0.30 <0.30   BNP (last 3 results)  Basename 08/30/12 0437 03/01/12 0959 11/24/11 1426  PROBNP 346.3 141.0* 138.0*   CBG: No results found for this basename: GLUCAP:5 in the last 168 hours  No results found for this or any previous visit (from the past 240 hour(s)).   Studies: Dg Chest 2 View  08/30/2012  *  RADIOLOGY REPORT*  Clinical Data: Left-sided chest pain  CHEST - 2 VIEW  Comparison: 09/08/2011  Findings: Heart size upper normal to mildly enlarged.  Chronic interstitial markings and central bronchitic changes.  No new airspace opacity, pleural effusion, or pneumothorax.  Diffuse osteopenia with mild compression of a lower thoracic vertebrae is similar to prior.  Surgical clips project over the upper abdomen.  IMPRESSION: Chronic interstitial markings.  No confluent airspace opacity.  Heart size upper normal to mildly enlarged.  Similar to prior.   Original Report Authenticated By: Waneta Martins, M.D.     Scheduled Meds:    . amLODipine  5 mg Oral Daily   And  . irbesartan   300 mg Oral Daily  . aspirin EC  81 mg Oral Daily  . enoxaparin (LOVENOX) injection  40 mg Subcutaneous Q24H  . furosemide  40 mg Oral Daily  . isosorbide mononitrate  60 mg Oral Daily  . levothyroxine  50 mcg Oral Daily  . pantoprazole  40 mg Oral Daily  . pentosan polysulfate  100 mg Oral TID AC  . potassium chloride  10 mEq Oral Daily  . simvastatin  20 mg Oral q1800  . sodium chloride  3 mL Intravenous Q12H  . DISCONTD: sodium chloride  3 mL Intravenous Q12H   Continuous Infusions:   Principal Problem:  *Syncope Active Problems:  HYPERTENSION  CORONARY ARTERY DISEASE  Diastolic HF (heart failure)    Time spent: 30 minutes    Yehudah Standing  [facility]  08/31/2012, 12:21 PM  LOS: 1 day

## 2012-08-31 NOTE — Progress Notes (Signed)
Occupational Therapy Treatment Patient Details Name: Victoria Gomez MRN: [id number] DOB: 01/06/1925 Today's Date: 08/31/2012 Time: 1740-1800 OT Time Calculation (min): 20 min  OT Assessment / Plan / Recommendation Comments on Treatment Session Orthostatics taken. supine 133/55. sitting 129/66. standing 114/60. HR 72 - 74. O2 sats 96 RA. Pt asymptomatic. During ambulation, HR 75. Pt with occasional sway of balance. Most likely from inactivity. Rec for pt to continue with basic ADL and to ambulate in halls with staff. No further OT needs.  (Pt has RW and BSC.) Rec to husband that pt have initial 24 hour supervision until her strength returns, then intermittent S. Husband will be able to provide level of support.     Follow Up Recommendations  No OT follow up    Barriers to Discharge   none    Equipment Recommendations  None recommended by OT    Recommendations for Other Services  none  Frequency     Plan Discharge plan remains appropriate    Precautions / Restrictions Precautions Precautions: None Restrictions Weight Bearing Restrictions: No   Pertinent Vitals/Pain     ADL  Eating/Feeding: Independent Grooming: Independent Upper Body Bathing: Modified independent Lower Body Bathing: Supervision/safety Upper Body Dressing: Modified independent Lower Body Dressing: Modified independent Toilet Transfer: Supervision/safety Toilet Transfer Equipment: Comfort height toilet Toileting - Clothing Manipulation and Hygiene: Modified independent Where Assessed - Toileting Clothing Manipulation and Hygiene: Standing Transfers/Ambulation Related to ADLs: S ADL Comments: Educated pt/husband on home safety and modification sto reduce risk of falls and maximize independence    OT Diagnosis:    OT Problem List:   OT Treatment Interventions:     OT Goals Acute Rehab OT Goals OT Goal Formulation: With patient Time For Goal Achievement: 09/07/12 Potential to Achieve Goals:  Good Miscellaneous OT Goals Miscellaneous OT Goal #1: Pt/husband to verbalize home safety modifications and use of DME to increase safety and independence within the home. OT Goal: Miscellaneous Goal #1 - Progress: Met  Visit Information  Last OT Received On: 08/31/12 Assistance Needed: +1    Subjective Data      Prior Functioning  Home Living Lives With: Spouse;Son (son has cancer) Available Help at Discharge: Available 24 hours/day Type of Home: House Home Access: Stairs to enter [employer] of Steps: 3 Entrance Stairs-Rails: Can reach both Home Layout: One level Bathroom Shower/Tub: Walk-in shower;Door [employer] Toilet: Handicapped height Bathroom Accessibility: Yes How Accessible: Accessible via walker Home Adaptive Equipment: Walker - rolling;Bedside commode/3-in-1 Prior Function Level of Independence: Independent Able to Take Stairs?: Yes Driving: No Vocation: Retired Musician: No difficulties Dominant Hand: Right    Cognition  Overall Cognitive Status: Appears within functional limits for tasks assessed/performed Arousal/Alertness: Awake/alert Orientation Level: Appears intact for tasks assessed Behavior During Session: [facility] for tasks performed    Mobility  Shoulder Instructions Bed Mobility Bed Mobility: Supine to Sit;Sit to Supine Supine to Sit: 7: Independent Sit to Supine: 7: Independent Transfers Transfers: Sit to Stand;Stand to Sit Sit to Stand: 5: Supervision Stand to Sit: 5: Supervision       Exercises      Balance    S   End of Session OT - End of Session Equipment Utilized During Treatment: Gait belt Activity Tolerance: Patient tolerated treatment well Patient left: in bed;with call bell/phone within reach;with family/visitor present Nurse Communication: Other (comment) (orthostatics)  GO Functional Assessment Tool Used: clinical judgement Functional Limitation: Self care Self Care Current Status (W0981):  At least 1 percent but less than  20 percent impaired, limited or restricted Self Care Goal Status (Z6109): At least 1 percent but less than 20 percent impaired, limited or restricted Self Care Discharge Status [phone]): At least 1 percent but less than 20 percent impaired, limited or restricted   Jansel Vonstein,HILLARY 08/31/2012, 6:07 PM [facility], OTR/L  [phone] 08/31/2012

## 2012-08-31 NOTE — Plan of Care (Signed)
Problem: Phase II Progression Outcomes Goal: IV changed to normal saline lock Outcome: Not Applicable Date Met:  08/31/12 Pt never on IVF, goal NA  Problem: Phase III Progression Outcomes Goal: Pain controlled on oral analgesia Outcome: Completed/Met Date Met:  08/31/12 Pt has had no c/o pain, goal met Goal: Voiding independently Outcome: Completed/Met Date Met:  08/31/12 Pt on bathroom privileges voiding adequate amounts of urine, no need for foley, goal met Goal: Foley discontinued Outcome: Not Applicable Date Met:  08/31/12 Pt never had foley, goal NA

## 2012-09-01 ENCOUNTER — Ambulatory Visit: Payer: Medicare Other | Admitting: Internal Medicine

## 2012-09-01 DIAGNOSIS — K219 Gastro-esophageal reflux disease without esophagitis: Secondary | ICD-10-CM

## 2012-09-01 DIAGNOSIS — I509 Heart failure, unspecified: Secondary | ICD-10-CM | POA: Diagnosis not present

## 2012-09-01 DIAGNOSIS — I10 Essential (primary) hypertension: Secondary | ICD-10-CM

## 2012-09-01 DIAGNOSIS — E785 Hyperlipidemia, unspecified: Secondary | ICD-10-CM

## 2012-09-01 DIAGNOSIS — I2510 Atherosclerotic heart disease of native coronary artery without angina pectoris: Secondary | ICD-10-CM | POA: Diagnosis not present

## 2012-09-01 DIAGNOSIS — E039 Hypothyroidism, unspecified: Secondary | ICD-10-CM

## 2012-09-01 DIAGNOSIS — R55 Syncope and collapse: Secondary | ICD-10-CM | POA: Diagnosis not present

## 2012-09-01 MED ORDER — FUROSEMIDE 40 MG PO TABS: 40.0000 mg | ORAL_TABLET | Freq: Every day | ORAL | Status: DC

## 2012-09-01 MED ORDER — ISOSORBIDE MONONITRATE ER 60 MG PO TB24: 60.0000 mg | ORAL_TABLET | Freq: Every day | ORAL | Status: DC

## 2012-09-01 NOTE — Discharge Summary (Signed)
Physician Discharge Summary  Patient ID: Victoria Gomez MRN: [id number] DOB/AGE: 1925/10/09 78 y.o.  Admit date: 08/30/2012 Discharge date: 09/01/2012  Primary Care Physician:  Carrie Mew, MD  Discharge Diagnoses:    .Syncope likely secondary to orthostatic hypotension and bradycardia  .Diastolic HF (heart failure) .HYPERTENSION .CORONARY ARTERY DISEASE  Consults: Dr. Rollene Rotunda, [facility] for syncope and bradycardia  Discharge Medications:   Medication List     As of 09/01/2012  6:13 PM    STOP taking these medications         nebivolol 2.5 MG tablet   Commonly known as: BYSTOLIC      TAKE these medications         amLODipine-valsartan 5-320 MG per tablet   Commonly known as: EXFORGE   Take 1 tablet by mouth daily.      aspirin 81 MG tablet   Take 81 mg by mouth daily.      Calcium 600+D 600-200 MG-UNIT Tabs   Generic drug: Calcium Carbonate-Vitamin D   Take by mouth 2 (two) times daily.      furosemide 40 MG tablet   Commonly known as: LASIX   Take 1 tablet (40 mg total) by mouth daily.      HYDROcodone-acetaminophen 5-325 MG per tablet   Commonly known as: NORCO/VICODIN   Take 1 tablet by mouth every 4 (four) hours as needed for pain.      isosorbide mononitrate 60 MG 24 hr tablet   Commonly known as: IMDUR   Take 1 tablet (60 mg total) by mouth daily. Pt request west-wards tabs      lansoprazole 30 MG capsule   Commonly known as: PREVACID   Take 1 capsule (30 mg total) by mouth 2 (two) times daily.      levothyroxine 50 MCG tablet   Commonly known as: SYNTHROID, LEVOTHROID   Take 50 mcg by mouth daily.      multivitamin tablet   Take 1 tablet by mouth daily.      nitroGLYCERIN 0.4 MG SL tablet   Commonly known as: NITROSTAT   Place 1 tablet (0.4 mg total) under the tongue every 5 (five) minutes as needed. If pain after 3 pills- call 911      pentosan polysulfate 100 MG capsule   Commonly known as: ELMIRON   Take 100 mg  by mouth 3 (three) times daily before meals.      potassium chloride 10 MEQ tablet   Commonly known as: K-DUR,KLOR-CON   Take 10 mEq by mouth daily.      pravastatin 40 MG tablet   Commonly known as: PRAVACHOL   Take 1 tablet (40 mg total) by mouth daily. Pt request west-wards tabs          Brief H and P: For complete details please refer to admission H and P, but in brief 78 year-old female with history of CAD status post stenting, hypothyroidism, diastolic CHF last EF measured in October 2012 was 55-60% was brought to the ER after patient had an episode of loss of consciousness. Patient had waken up at around 2 AM on the day of admission to open the to for her son. After which patient felt little dizzy and she passed out. She did not have any focal deficits. Patient did not have any shortness of breath or chest pain, palpitations prior or after the incident. Patient did not have any incontinence of urine, tongue bite or any seizure-like activities.  Patient was recently found  to be having bradycardia. Patient's cardiologist had decreased her beta blocker do to half. In the ER patient was found to have heart rate around 44-48 per minute in sinus.   Hospital Course:  Syncope with sinus bradycardia - patient's EKG showed sinus bradycardia with LBBB. LBBB is chronic. Cardiology was consulted. Patient also had orthostatic hypotension. Bystolic was discontinued. Due to positive orthostatics, Lasix and Imdur dose was decreased to half. Patient was set up for Holter monitor outpatient by [facility] cardiology. She has an appointment with Dr. Antoine Poche for outpatient followup. VQ scan was low probability for PE. CAD status post stenting - denied any chest pain at this time. On aspirin.  History of diastolic CHF last EF measured October 2012 was 55-60% - see #1, euvolemic on exam and compenstated.  Hypertension - stable  Hypothyroidism -TSH 1.6, continue Synthroid.     Day of Discharge BP 115/72   Pulse 53  Temp 98.4 F (36.9 C) (Oral)  Resp 18  Ht 5\' 5"  (1.651 m)  Wt 73.256 kg (161 lb 8 oz)  BMI 26.88 kg/m2  SpO2 90%  Physical Exam: General: Alert and awake oriented x3 not in any acute distress. HEENT: anicteric sclera, pupils reactive to light and accommodation CVS: S1-S2 clear no murmur rubs or gallops Chest: clear to auscultation bilaterally, no wheezing rales or rhonchi Abdomen: soft nontender, nondistended, normal bowel sounds, no organomegaly Extremities: no cyanosis, clubbing or edema noted bilaterally Neuro: Cranial nerves II-XII intact, no focal neurological deficits   The results of significant diagnostics from this hospitalization (including imaging, microbiology, ancillary and laboratory) are listed below for reference.    LAB RESULTS: Basic Metabolic Panel:  Lab 08/31/12 1914 08/30/12 0440  NA 144 142  K 3.8 3.4*  CL 106 106  CO2 28 28  GLUCOSE 113* 126*  BUN 17 12  CREATININE 1.23* 1.28*  CALCIUM 9.3 8.5  MG -- 2.1  PHOS -- --   CBC:  Lab 08/30/12 0845 08/30/12 0440  WBC 5.0 5.7  NEUTROABS -- --  HGB 11.4* 11.5*  HCT 35.2* 35.3*  MCV 86.7 --  PLT 171 169   Cardiac Enzymes:  Lab 08/30/12 2011 08/30/12 1311  CKTOTAL -- --  CKMB -- --  CKMBINDEX -- --  TROPONINI <0.30 <0.30    Significant Diagnostic Studies:  Dg Chest 2 View  08/30/2012  *RADIOLOGY REPORT*  Clinical Data: Left-sided chest pain  CHEST - 2 VIEW  Comparison: 09/08/2011  Findings: Heart size upper normal to mildly enlarged.  Chronic interstitial markings and central bronchitic changes.  No new airspace opacity, pleural effusion, or pneumothorax.  Diffuse osteopenia with mild compression of a lower thoracic vertebrae is similar to prior.  Surgical clips project over the upper abdomen.  IMPRESSION: Chronic interstitial markings.  No confluent airspace opacity.  Heart size upper normal to mildly enlarged.  Similar to prior.   Original Report Authenticated By: Waneta Martins,  M.D.      Disposition and Follow-up: Discharge Orders    Future Appointments: Provider: Department: Dept Phone: Center:   09/30/2012 4:00 PM Rollene Rotunda, MD [facility] [phone] [facility]     Future Orders Please Complete By Expires   Diet - low sodium heart healthy      Increase activity slowly      (HEART FAILURE PATIENTS) Call MD:  Anytime you have any of the following symptoms: 1) 3 pound weight gain in 24 hours or 5 pounds in 1 week 2) shortness of breath, with or  without a dry hacking cough 3) swelling in the hands, feet or stomach 4) if you have to sleep on extra pillows at night in order to breathe.      Discharge instructions      Comments:   [facility] office will call you to set up Holter monitor. Please review the medication changes.       DISPOSITION: Home  DIET: heart healthy  ACTIVITY: as tolerated  TESTS THAT NEED FOLLOW-UP Holter monitoring  DISCHARGE FOLLOW-UP Follow-up Information    Follow up with Carrie Mew, MD. Schedule an appointment as soon as possible for a visit in 10 days. (for hospital follow-up)    Contact information:   [address] Christena Flake [facility] [place] [address] [phone]       Follow up with Rollene Rotunda, MD. On 09/30/2012. (at 4PM )    Contact information:   [address] [place]lyn Prime [place] [address] [phone]          Time spent on Discharge: 38 mins  Signed:   Evaluna Utke M.D. [facility] 09/01/2012, 6:13 PM Pager: [phone]

## 2012-09-01 NOTE — Plan of Care (Signed)
Problem: Discharge Progression Outcomes Goal: Discharge plan in place and appropriate Outcome: Completed/Met Date Met:  09/01/12 Pt being d/c'd to home with husband, pt given heart failure booklet, education given to pt and husband, they verbalized understanding, pt also given handout from [facility] clinic on Holter monitor that pt will be doing outpatient, all prescriptions and follow up appointments given, pt verbalized understanding, pt left via wheelchair, pt stable

## 2012-09-01 NOTE — Plan of Care (Addendum)
Problem: Phase I Progression Outcomes Goal: Hemodynamically stable Outcome: Completed/Met Date Met:  09/01/12 Pt stable, no c/o c/p or sob, goal met  Problem: Phase II Progression Outcomes Goal: Obtain order to discontinue catheter if appropriate Outcome: Not Applicable Date Met:  09/01/12 Pt does not have foley goal NA Goal: Other Phase II Outcomes/Goals Outcome: Not Applicable Date Met:  09/01/12 na  Problem: Phase III Progression Outcomes Goal: Activity at appropriate level-compared to baseline Outcome: Completed/Met Date Met:  09/01/12 Pt oob with PT, pt did well no c/o sob with exertion, goal met

## 2012-09-01 NOTE — Evaluation (Signed)
Physical Therapy Evaluation Patient Details Name: Victoria Gomez MRN: [id number] DOB: June 17, 1925 Today's Date: 09/01/2012 Time: 4098-1191 PT Time Calculation (min): 28 min  PT Assessment / Plan / Recommendation Clinical Impression  Patient s/p syncope with no deficits for mobility.  No orthostasis either.  Will not follow.  No HH f/u recommended.    PT Assessment  Patent does not need any further PT services    Follow Up Recommendations  No PT follow up    Does the patient have the potential to tolerate intense rehabilitation      Barriers to Discharge        Equipment Recommendations  None recommended by PT    Recommendations for Other Services     Frequency      Precautions / Restrictions Precautions Precautions: None Restrictions Weight Bearing Restrictions: No   Pertinent Vitals/Pain Orthostatic BPs  Supine 115/72, 44 bpm  Sitting 124/60, 53 bpm  Standing Would not register  Standing after 3 min 123/58, 61 bpm    No pain     Mobility  Bed Mobility Bed Mobility: Supine to Sit;Sit to Supine Supine to Sit: 7: Independent Sit to Supine: 7: Independent Transfers Transfers: Sit to Stand;Stand to Sit Sit to Stand: 7: Independent Stand to Sit: 7: Independent Ambulation/Gait Ambulation/Gait Assistance: 7: Independent Ambulation Distance (Feet): 250 Feet Assistive device: None Ambulation/Gait Assistance Details: No LOB with minimal to moderate challenges.   Gait Pattern: Within Functional Limits Stairs: No (declined practice) Wheelchair Mobility Wheelchair Mobility: No    PT Goals  N/A  Visit Information  Last PT Received On: 09/01/12 Assistance Needed: +1    Subjective Data  Subjective: "I think I will do fine." Patient Stated Goal: To go home   Prior Functioning  Home Living Lives With: Spouse;Son Available Help at Discharge: Available 24 hours/day Type of Home: House Home Access: Stairs to enter [employer] of Steps: 3 Entrance  Stairs-Rails: Can reach both Home Layout: One level Bathroom Shower/Tub: Walk-in shower;Door [employer] Toilet: Handicapped height Bathroom Accessibility: Yes How Accessible: Accessible via walker Home Adaptive Equipment: Walker - rolling;Bedside commode/3-in-1 Prior Function Level of Independence: Independent Able to Take Stairs?: Yes Driving: No Vocation: Retired Musician: No difficulties Dominant Hand: Right    Cognition  Overall Cognitive Status: Appears within functional limits for tasks assessed/performed Arousal/Alertness: Awake/alert Orientation Level: Appears intact for tasks assessed Behavior During Session: [facility] for tasks performed    Extremity/Trunk Assessment Right Lower Extremity Assessment RLE ROM/Strength/Tone: [facility] for tasks assessed Left Lower Extremity Assessment LLE ROM/Strength/Tone: [facility] for tasks assessed Trunk Assessment Trunk Assessment: Normal   Balance Dynamic Standing Balance Dynamic Standing - Balance Support: No upper extremity supported;During functional activity Dynamic Standing - Level of Assistance: 7: Independent Dynamic Standing - Balance Activities: Lateral lean/weight shifting;Forward lean/weight shifting;Reaching across midline;Compliant surfaces  End of Session PT - End of Session Equipment Utilized During Treatment: Gait belt Activity Tolerance: Patient tolerated treatment well Patient left: in bed;with call bell/phone within reach;with family/visitor present Nurse Communication: Mobility status  GP Functional Assessment Tool Used: Clinical judgment Functional Limitation: Mobility: Walking and moving around Mobility: Walking and Moving Around Current Status (Y7829): 0 percent impaired, limited or restricted Mobility: Walking and Moving Around Goal Status (F6213): 0 percent impaired, limited or restricted Mobility: Walking and Moving Around Discharge Status [phone]): 0 percent impaired, limited or restricted    INGOLD,Olufemi Mofield 09/01/2012, 12:55 PM  [facility] [phone] (pager)

## 2012-09-02 ENCOUNTER — Ambulatory Visit: Payer: Medicare Other | Admitting: Cardiology

## 2012-09-07 ENCOUNTER — Encounter (INDEPENDENT_AMBULATORY_CARE_PROVIDER_SITE_OTHER): Payer: Medicare Other

## 2012-09-07 ENCOUNTER — Encounter: Payer: Self-pay | Admitting: Cardiology

## 2012-09-07 DIAGNOSIS — R55 Syncope and collapse: Secondary | ICD-10-CM | POA: Diagnosis not present

## 2012-09-09 ENCOUNTER — Ambulatory Visit (INDEPENDENT_AMBULATORY_CARE_PROVIDER_SITE_OTHER): Payer: Medicare Other | Admitting: Family

## 2012-09-09 ENCOUNTER — Encounter: Payer: Self-pay | Admitting: Family

## 2012-09-09 VITALS — BP 120/62 | HR 87 | Temp 98.4°F | Wt 162.0 lb

## 2012-09-09 DIAGNOSIS — R079 Chest pain, unspecified: Secondary | ICD-10-CM

## 2012-09-09 MED ORDER — ISOSORBIDE MONONITRATE ER 60 MG PO TB24: 90.0000 mg | ORAL_TABLET | Freq: Every day | ORAL | Status: DC

## 2012-09-09 NOTE — Patient Instructions (Addendum)
The patient is instructed to continue all medications as prescribed. Schedule followup with check out clerk upon leaving the clinic  

## 2012-09-10 ENCOUNTER — Encounter: Payer: Self-pay | Admitting: Family

## 2012-09-10 NOTE — Progress Notes (Signed)
Subjective:    Patient ID: Victoria Gomez, female    DOB: [birth year], 78 y.o.   MRN: [id number]  HPI 78 year old white female, patient of Dr. Lovell Sheehan, nonsmoker is in today for hospital followup of her a syncopal episode. Patient had cardiac enzymes done that were all negative. She has been on a Holter monitor prescribed by cardiology x2 days. Reports monitoring in being on yesterday. Rates the pain 5/10 to her left chest and, described as achy. Today, she feels better. However, she has concerns of chest pain that she experienced this morning around 8 AM coupled with palpitations. Patient reports taken nitroglycerin that helped her symptoms. She had lightheadedness and dizziness during this time but just lay down and her symptoms went away about an hour later. At current, she has no lightheadedness, dizziness, chest pain, palpitations, shortness of breath or edema.   Review of Systems  Constitutional: Negative.   HENT: Negative.   Respiratory: Negative.   Cardiovascular: Positive for chest pain and palpitations.       Resolved from this morning  Gastrointestinal: Negative.  Negative for nausea and vomiting.  Musculoskeletal: Negative.   Skin: Negative.   Neurological: Negative.   Hematological: Negative.   Psychiatric/Behavioral: Negative.    Past Medical History  Diagnosis Date  . HYPERLIPIDEMIA 03/07/2008  . HYPERTENSION 08/03/2007  . CORONARY ARTERY DISEASE 08/03/2007  . Acute on chronic systolic heart failure 05/23/2009  . HYPOTHYROIDISM 08/03/2007  . Esophageal reflux 10/18/2007  . DIVERTICULOSIS, COLON 08/03/2007  . DISORDER, BLADDER NEC 08/03/2007  . BREAST MASS, BENIGN 05/23/2009  . Edema 02/19/2009  . BARRETT'S ESOPHAGUS, HX OF 08/03/2007  . Interstitial cystitis   . Arthritis   . Internal hemorrhoids   . Epistaxis   . Right hand fracture   . Diarrhea   . Bladder disorder   . Myocardial infarction   . Anginal pain   . Dysrhythmia 08/2012    symptomatic bradycardia  .  Shortness of breath     History   Social History  . Marital Status: Married    Spouse Name: N/A    Number of Children: N/A  . Years of Education: N/A   Occupational History  . retired    Social History Main Topics  . Smoking status: Former Smoker -- 1.0 packs/day for 15 years    Types: Cigarettes    Quit date: 11/10/1969  . Smokeless tobacco: Never Used  . Alcohol Use: No  . Drug Use: No  . Sexually Active: No   Other Topics Concern  . Not on file   Social History Narrative  . No narrative on file    Past Surgical History  Procedure Date  . Breast surgery     benign mass  . Cholecystectomy   . Cesarean section   . Appendectomy   . Cataract extraction, bilateral   . Tonsillectomy   . Abdominal hysterectomy   . Ankle reconstruction     Left  . Wrist reconstruction     Right    Family History  Problem Relation Age of Onset  . Colon cancer Mother   . Coronary artery disease Mother   . Heart disease Mother   . Coronary artery disease Father   . Heart disease Father   . Colon polyps Sister   . Coronary artery disease Sister   . Coronary artery disease Brother   . Diabetes type II Son     Allergies  Allergen Reactions  . Codeine   . Sulfonamide Derivatives  Current Outpatient Prescriptions on File Prior to Visit  Medication Sig Dispense Refill  . amLODipine-valsartan (EXFORGE) 5-320 MG per tablet Take 1 tablet by mouth daily.  90 tablet  3  . aspirin 81 MG tablet Take 81 mg by mouth daily.        . Calcium Carbonate-Vitamin D (CALCIUM 600+D) 600-200 MG-UNIT TABS Take by mouth 2 (two) times daily.        . furosemide (LASIX) 40 MG tablet Take 1 tablet (40 mg total) by mouth daily.  90 tablet  3  . HYDROcodone-acetaminophen (NORCO) 5-325 MG per tablet Take 1 tablet by mouth every 4 (four) hours as needed for pain.  40 tablet  0  . isosorbide mononitrate (IMDUR) 60 MG 24 hr tablet Take 1.5 tablets (90 mg total) by mouth daily. Pt request west-wards  tabs  45 tablet  3  . lansoprazole (PREVACID) 30 MG capsule Take 1 capsule (30 mg total) by mouth 2 (two) times daily.  180 capsule  3  . levothyroxine (SYNTHROID, LEVOTHROID) 50 MCG tablet Take 50 mcg by mouth daily.      . Multiple Vitamin (MULTIVITAMIN) tablet Take 1 tablet by mouth daily.        . nitroGLYCERIN (NITROSTAT) 0.4 MG SL tablet Place 1 tablet (0.4 mg total) under the tongue every 5 (five) minutes as needed. If pain after 3 pills- call 911  90 tablet  3  . pentosan polysulfate (ELMIRON) 100 MG capsule Take 100 mg by mouth 3 (three) times daily before meals.        . potassium chloride (K-DUR,KLOR-CON) 10 MEQ tablet Take 10 mEq by mouth daily.      . pravastatin (PRAVACHOL) 40 MG tablet Take 1 tablet (40 mg total) by mouth daily. Pt request west-wards tabs  90 tablet  3    BP 120/62  Pulse 87  Temp 98.4 F (36.9 C) (Oral)  Wt 162 lb (73.483 kg)  SpO2 98%chart    Objective:   Physical Exam  Constitutional: She is oriented to person, place, and time. She appears well-developed and well-nourished.  HENT:  Right Ear: External ear normal.  Left Ear: External ear normal.  Nose: Nose normal.  Mouth/Throat: Oropharynx is clear and moist.  Neck: Normal range of motion. Neck supple. No thyromegaly present.  Cardiovascular: Normal rate, regular rhythm and normal heart sounds.   Pulmonary/Chest: Effort normal and breath sounds normal.  Abdominal: Soft. Bowel sounds are normal.  Musculoskeletal: Normal range of motion.  Neurological: She is alert and oriented to person, place, and time.  Skin: Skin is warm and dry.  Psychiatric: She has a normal mood and affect.     EKG: Inverted T waves in lead 1, otherwise within normal limits for patient. If the first degree heart block     Assessment & Plan:  Assessment: Chest pain, palpitations, syncope-resolved  Plan: I will send her cardiologist to message just to see if he wants to see her before November 21. I also consulted with  Dr. Lovell Sheehan given her nonspecific ST changes in lead 1. Dr. Lovell Sheehan believe that is true ischemia but rather a lead placement issue versus known first degree heart block. He has suggested that we increase her Imdur to 90 mg daily. Followup with cardiologist as scheduled. Go to the emergency department immediately if she experiences any more chest pain, shortness of breath, or palpitations. And

## 2012-09-17 ENCOUNTER — Encounter: Payer: Self-pay | Admitting: Nurse Practitioner

## 2012-09-17 ENCOUNTER — Ambulatory Visit (INDEPENDENT_AMBULATORY_CARE_PROVIDER_SITE_OTHER): Payer: Medicare Other | Admitting: Nurse Practitioner

## 2012-09-17 VITALS — BP 146/68 | HR 60 | Ht 63.0 in | Wt 163.6 lb

## 2012-09-17 DIAGNOSIS — R55 Syncope and collapse: Secondary | ICD-10-CM

## 2012-09-17 NOTE — Patient Instructions (Addendum)
Stay on your current medicines  I want you checking your blood pressure, your heart rate and writing down how you feel every day until you are seen back.  See Dr. Antoine Poche later this month as planned.  You cannot drive  For now, stay on your current medicines  Call the [facility] office at [phone] if you have any questions, problems or concerns.

## 2012-09-17 NOTE — Progress Notes (Signed)
Victoria Gomez Date of Birth: January 23, 1925 Medical Record #[id number]  History of Present Illness: Victoria Gomez is seen today for a post hospital visit. She is seen for Dr. Antoine Poche. She is 78 years old. She has known CAD with past stenting, HLD, HTN, diastolic heart failure and GERD. Victoria EF is normal.  Victoria other issues are as outlined below.   She was most recently in the hospital about 2 weeks ago. Had syncope that was felt to be secondary to orthostatic hypotension and bradycardia. She had gotten up around 2 am on the day of admission to open a door for Victoria son and afterwards, she got dizzy and then passed out. This was not witnessed. She had not been having any other symptoms but says she remembers feeling "funny". She was not incontinent. No seizures. No tongue biting. Found to be orthostatic and bradycardic (heart rate in the 40's). Bystolic was stopped. Lasix and Imdur were cut in half. She has had a Holter showing no sustained tachybradyarrhythmias. The possibility of a pacemaker has apparently been introduced.   She comes back today. She is here with Victoria Gomez. She is doing ok. Has already been back to Victoria PCP last Friday because she had had some chest pain and fast heart beating. This was after the Holter. She took NTG. Victoria Imdur has been increased back to 90 mg a day. She reports that she will have some chest pain on occasion and have to use NTG. She was more concerned about the fast heart beat. She thinks she is a little better this week. No more syncopal spells. She does not drive. No more chest pain. She notes that for several months Victoria energy level has not been what she thought it should be. They are very active people for the most part. She does not check Victoria blood pressure at home but has a cuff.   Current Outpatient Prescriptions on File Prior to Visit  Medication Sig Dispense Refill  . amLODipine-valsartan (EXFORGE) 5-320 MG per tablet Take 1 tablet by mouth daily.  90 tablet  3  .  aspirin 81 MG tablet Take 81 mg by mouth daily.        . Calcium Carbonate-Vitamin D (CALCIUM 600+D) 600-200 MG-UNIT TABS Take by mouth 2 (two) times daily.        . furosemide (LASIX) 40 MG tablet Take 1 tablet (40 mg total) by mouth daily.  90 tablet  3  . HYDROcodone-acetaminophen (NORCO) 5-325 MG per tablet Take 1 tablet by mouth every 4 (four) hours as needed for pain.  40 tablet  0  . isosorbide mononitrate (IMDUR) 60 MG 24 hr tablet Take 1.5 tablets (90 mg total) by mouth daily. Pt request west-wards tabs  45 tablet  3  . lansoprazole (PREVACID) 30 MG capsule Take 1 capsule (30 mg total) by mouth 2 (two) times daily.  180 capsule  3  . levothyroxine (SYNTHROID, LEVOTHROID) 50 MCG tablet Take 50 mcg by mouth daily.      . Multiple Vitamin (MULTIVITAMIN) tablet Take 1 tablet by mouth daily.        . nitroGLYCERIN (NITROSTAT) 0.4 MG SL tablet Place 1 tablet (0.4 mg total) under the tongue every 5 (five) minutes as needed. If pain after 3 pills- call 911  90 tablet  3  . pentosan polysulfate (ELMIRON) 100 MG capsule Take 100 mg by mouth 3 (three) times daily before meals.        . potassium chloride (K-DUR,KLOR-CON)  10 MEQ tablet Take 10 mEq by mouth daily.      . pravastatin (PRAVACHOL) 40 MG tablet Take 1 tablet (40 mg total) by mouth daily. Pt request west-wards tabs  90 tablet  3    Allergies  Allergen Reactions  . Codeine   . Sulfonamide Derivatives     Past Medical History  Diagnosis Date  . HYPERLIPIDEMIA 03/07/2008  . HYPERTENSION 08/03/2007  . CORONARY ARTERY DISEASE 08/03/2007  . Acute on chronic systolic heart failure 05/23/2009  . HYPOTHYROIDISM 08/03/2007  . Esophageal reflux 10/18/2007  . DIVERTICULOSIS, COLON 08/03/2007  . DISORDER, BLADDER NEC 08/03/2007  . BREAST MASS, BENIGN 05/23/2009  . Edema 02/19/2009  . BARRETT'S ESOPHAGUS, HX OF 08/03/2007  . Interstitial cystitis   . Arthritis   . Internal hemorrhoids   . Epistaxis   . Right hand fracture   . Diarrhea   .  Bladder disorder   . Myocardial infarction   . Anginal pain   . Dysrhythmia 08/2012    symptomatic bradycardia  . Shortness of breath     Past Surgical History  Procedure Date  . Breast surgery     benign mass  . Cholecystectomy   . Cesarean section   . Appendectomy   . Cataract extraction, bilateral   . Tonsillectomy   . Abdominal hysterectomy   . Ankle reconstruction     Left  . Wrist reconstruction     Right    History  Smoking status  . Former Smoker -- 1.0 packs/day for 15 years  . Types: Cigarettes  . Quit date: 11/10/1969  Smokeless tobacco  . Never Used    History  Alcohol Use No    Family History  Problem Relation Age of Onset  . Colon cancer Mother   . Coronary artery disease Mother   . Heart disease Mother   . Coronary artery disease Father   . Heart disease Father   . Colon polyps Sister   . Coronary artery disease Sister   . Coronary artery disease Brother   . Diabetes type II Son     Review of Systems: The review of systems is per the HPI.  All other systems were reviewed and are negative.  Physical Exam: BP 146/68  Pulse 60  Ht 5\' 3"  (1.6 m)  Wt 163 lb 9.6 oz (74.208 kg)  BMI 28.98 kg/m2 Patient is very pleasant and in no acute distress. She looks younger than Victoria stated age. Skin is warm and dry. Color is normal.  HEENT is unremarkable. Normocephalic/atraumatic. PERRL. Sclera are nonicteric. Neck is supple. No masses. No JVD. Lungs are clear. Cardiac exam shows a regular rate and rhythm. Abdomen is soft. Extremities are without edema. Gait and ROM are intact. No gross neurologic deficits noted.   LABORATORY DATA: Lab Results  Component Value Date   WBC 5.0 08/30/2012   HGB 11.4* 08/30/2012   HCT 35.2* 08/30/2012   PLT 171 08/30/2012   GLUCOSE 113* 08/31/2012   CHOL 137 03/01/2012   TRIG 121.0 03/01/2012   HDL 49.50 03/01/2012   LDLDIRECT 79.7 12/11/2010   LDLCALC 63 03/01/2012   ALT 12 03/01/2012   AST 18 03/01/2012   NA 144  08/31/2012   K 3.8 08/31/2012   CL 106 08/31/2012   CREATININE 1.23* 08/31/2012   BUN 17 08/31/2012   CO2 28 08/31/2012   TSH 1.612 08/30/2012   INR 1.0 01/27/2011   HGBA1C 6.8* 05/06/2011   Dg Chest 2 View  08/30/2012  *  RADIOLOGY REPORT*  Clinical Data: Left-sided chest pain  CHEST - 2 VIEW  Comparison: 09/08/2011  Findings: Heart size upper normal to mildly enlarged.  Chronic interstitial markings and central bronchitic changes.  No new airspace opacity, pleural effusion, or pneumothorax.  Diffuse osteopenia with mild compression of a lower thoracic vertebrae is similar to prior.  Surgical clips project over the upper abdomen.  IMPRESSION: Chronic interstitial markings.  No confluent airspace opacity.  Heart size upper normal to mildly enlarged.  Similar to prior.   Original Report Authenticated By: Waneta Martins, M.D.    Echo Study Conclusions from October 2012  - Left ventricle: The cavity size was normal. Wall thickness was increased in a pattern of mild LVH. Systolic function was normal. The estimated ejection fraction was in the range of 55% to 60%. Features are consistent with a pseudonormal left ventricular filling pattern, with concomitant abnormal relaxation and increased filling pressure (grade 2 diastolic dysfunction). Doppler parameters are consistent with high ventricular filling pressure. - Aortic valve: Trivial regurgitation. - Mitral valve: Calcified annulus. - Left atrium: The atrium was mildly to moderately dilated. - Right ventricle: The cavity size was normal. Wall thickness was mildly increased.  Assessment / Plan:  1. Syncope - felt to be due to bradycardia and orthostasis. She had no significant tachy/brady arrhythmias on Victoria Holter. No pauses. She knows that she can not drive.   2. Palpitations - now not able to be on beta blocker.  3. CAD - recent bout of chest pain. Now back on higher dose of Imdur.  4. Fatigue - hard to say what the exact  etiology is. Could be from medicines, birthdays, or SSS.   She has follow up in about 10 days with Dr. Antoine Poche. In this interim, she is going to monitor Victoria blood pressure and Victoria heart rate at home as well as keep a diary of Victoria symptoms. If she continues to have palpitations/chest pain and would need beta blocker restarted, she will probably require PTVP.   Patient is agreeable to this plan and will call if any problems develop in the interim.

## 2012-09-21 ENCOUNTER — Other Ambulatory Visit: Payer: Self-pay | Admitting: Internal Medicine

## 2012-09-30 ENCOUNTER — Ambulatory Visit (INDEPENDENT_AMBULATORY_CARE_PROVIDER_SITE_OTHER): Payer: Medicare Other | Admitting: Cardiology

## 2012-09-30 ENCOUNTER — Encounter: Payer: Self-pay | Admitting: Cardiology

## 2012-09-30 VITALS — BP 146/69 | HR 67 | Wt 162.0 lb

## 2012-09-30 DIAGNOSIS — R55 Syncope and collapse: Secondary | ICD-10-CM

## 2012-09-30 DIAGNOSIS — I2510 Atherosclerotic heart disease of native coronary artery without angina pectoris: Secondary | ICD-10-CM

## 2012-09-30 DIAGNOSIS — I5030 Unspecified diastolic (congestive) heart failure: Secondary | ICD-10-CM

## 2012-09-30 DIAGNOSIS — I10 Essential (primary) hypertension: Secondary | ICD-10-CM

## 2012-09-30 NOTE — Patient Instructions (Addendum)
Your physician recommends that you schedule a follow-up appointment in: 4 months with Dr. Antoine Poche  Your physician recommends that you continue on your current medications as directed. Please refer to the Current Medication list given to you today.

## 2012-09-30 NOTE — Progress Notes (Signed)
HPI The patient presents for followup of syncope.  This is her second visit since her recent hospitalization. Since that visit she has had no further episodes of syncope. She's had no new shortness of breath, PND or orthopnea. She does occasionally get some palpitations but these are not bothering her. She does occasionally get some cramping under her left breast but this is a symptoms she had previously at the time of the catheterization when she was found to have stable coronary disease.  She's not having any chest pressure, neck or arm discomfort. She's not having any PND or orthopnea. She has had no palpitations, presyncope or syncope.   Allergies  Allergen Reactions  . Codeine   . Sulfonamide Derivatives     Current Outpatient Prescriptions  Medication Sig Dispense Refill  . amLODipine-valsartan (EXFORGE) 5-320 MG per tablet Take 1 tablet by mouth daily.  90 tablet  3  . aspirin 81 MG tablet Take 81 mg by mouth daily.        . Calcium Carbonate-Vitamin D (CALCIUM 600+D) 600-200 MG-UNIT TABS Take by mouth 2 (two) times daily.        . furosemide (LASIX) 40 MG tablet Take 1 tablet (40 mg total) by mouth daily.  90 tablet  3  . HYDROcodone-acetaminophen (NORCO) 5-325 MG per tablet Take 1 tablet by mouth every 4 (four) hours as needed for pain.  40 tablet  0  . isosorbide mononitrate (IMDUR) 60 MG 24 hr tablet Take 1.5 tablets (90 mg total) by mouth daily. Pt request west-wards tabs  45 tablet  3  . lansoprazole (PREVACID) 30 MG capsule Take 1 capsule (30 mg total) by mouth 2 (two) times daily.  180 capsule  3  . levothyroxine (SYNTHROID, LEVOTHROID) 50 MCG tablet Take 50 mcg by mouth daily.      . Multiple Vitamin (MULTIVITAMIN) tablet Take 1 tablet by mouth daily.        . nitroGLYCERIN (NITROSTAT) 0.4 MG SL tablet Place 1 tablet (0.4 mg total) under the tongue every 5 (five) minutes as needed. If pain after 3 pills- call 911  90 tablet  3  . pentosan polysulfate (ELMIRON) 100 MG capsule  Take 100 mg by mouth 3 (three) times daily before meals.        . potassium chloride (K-DUR,KLOR-CON) 10 MEQ tablet Take 10 mEq by mouth daily.      . pravastatin (PRAVACHOL) 40 MG tablet TAKE 1 TABLET DAILY.  90 tablet  3    Past Medical History  Diagnosis Date  . HYPERLIPIDEMIA 03/07/2008  . HYPERTENSION 08/03/2007  . CORONARY ARTERY DISEASE 08/03/2007  . Acute on chronic systolic heart failure 05/23/2009  . HYPOTHYROIDISM 08/03/2007  . Esophageal reflux 10/18/2007  . DIVERTICULOSIS, COLON 08/03/2007  . DISORDER, BLADDER NEC 08/03/2007  . BREAST MASS, BENIGN 05/23/2009  . Edema 02/19/2009  . BARRETT'S ESOPHAGUS, HX OF 08/03/2007  . Interstitial cystitis   . Arthritis   . Internal hemorrhoids   . Epistaxis   . Right hand fracture   . Diarrhea   . Bladder disorder   . Myocardial infarction   . Anginal pain   . Dysrhythmia 08/2012    symptomatic bradycardia  . Shortness of breath     Past Surgical History  Procedure Date  . Breast surgery     benign mass  . Cholecystectomy   . Cesarean section   . Appendectomy   . Cataract extraction, bilateral   . Tonsillectomy   . Abdominal hysterectomy   .  Ankle reconstruction     Left  . Wrist reconstruction     Right    ROS:  Otherwise as stated in the HPI and negative for all other systems.  PHYSICAL EXAM BP 146/69  Pulse 67  Wt 162 lb (73.483 kg) GENERAL:  Well appearing HEENT:  Pupils equal round and reactive, fundi not visualized, oral mucosa unremarkable NECK:  No jugular venous distention, waveform within normal limits, carotid upstroke brisk and symmetric, no bruits, no thyromegaly LUNGS:  Decreased breath sounds in the left lung base with few basilar crackles. BACK:  No CVA tenderness CHEST: Unremarkable HEART:  PMI not displaced or sustained,S1 and S2 within normal limits, no S3, no S4, no clicks, no rubs, no murmurs ABD:  Flat, positive bowel sounds normal in frequency in pitch, no bruits, no rebound, no guarding, no  midline pulsatile mass, no hepatomegaly, no splenomegaly EXT:  2 plus pulses throughout, no edema, no cyanosis no clubbing   ASSESSMENT AND PLAN  CORONARY ARTERY DISEASE - Her chest pain is atypical and unlikely to be angina. It is not different than symptoms she had at the time of her last catheterization. Therefore, no further imaging is available. She will let me know if she has any worsening symptoms.  HYPERTENSION -  I reviewed her blood pressure diary today. Her readings are very slightly above 140 systolic frequently but sometimes below. Given her recent episode of syncope and lightheadedness I would prefer not to titrate her medications. She will however continue to keep a blood pressure diary and I will make changes as needed.   Diastolic HF (heart failure) - She seems to be euvolemic. No change in therapy is indicated.  No further cardiovascular testing is indicated.   SYNCOPE - The patient has had no further episodes of syncope. At this point no further workup is suggested.

## 2012-10-14 DIAGNOSIS — N3941 Urge incontinence: Secondary | ICD-10-CM | POA: Diagnosis not present

## 2012-10-14 DIAGNOSIS — R351 Nocturia: Secondary | ICD-10-CM | POA: Diagnosis not present

## 2012-10-14 DIAGNOSIS — N3010 Interstitial cystitis (chronic) without hematuria: Secondary | ICD-10-CM | POA: Diagnosis not present

## 2012-11-08 DIAGNOSIS — J018 Other acute sinusitis: Secondary | ICD-10-CM | POA: Diagnosis not present

## 2012-11-15 ENCOUNTER — Ambulatory Visit (INDEPENDENT_AMBULATORY_CARE_PROVIDER_SITE_OTHER): Payer: Medicare Other | Admitting: Internal Medicine

## 2012-11-15 ENCOUNTER — Encounter: Payer: Self-pay | Admitting: Internal Medicine

## 2012-11-15 VITALS — BP 150/74 | HR 72 | Temp 98.2°F | Resp 16 | Ht 63.0 in | Wt 156.0 lb

## 2012-11-15 DIAGNOSIS — I509 Heart failure, unspecified: Secondary | ICD-10-CM | POA: Diagnosis not present

## 2012-11-15 DIAGNOSIS — J329 Chronic sinusitis, unspecified: Secondary | ICD-10-CM | POA: Diagnosis not present

## 2012-11-15 MED ORDER — NITROGLYCERIN 0.4 MG SL SUBL: 0.4000 mg | SUBLINGUAL_TABLET | SUBLINGUAL | Status: DC | PRN

## 2012-11-15 MED ORDER — ISOSORBIDE MONONITRATE ER 60 MG PO TB24: 90.0000 mg | ORAL_TABLET | Freq: Every day | ORAL | Status: DC

## 2012-11-15 MED ORDER — LEVOFLOXACIN 500 MG PO TABS: 500.0000 mg | ORAL_TABLET | Freq: Every day | ORAL | Status: DC

## 2012-11-15 NOTE — Progress Notes (Signed)
Subjective:    Patient ID: Victoria Gomez, female    DOB: 25-Nov-1924, 79 y.o.   MRN: [id number]  HPI  The patient had benn in the hospital for syncopy cardiology discontinuedt the byastolic. The patient has kept records of her blood pressures they generally are averaging around 150 with some as high as 178 but the average being around 150+ or -5 This has had a one-week history of sinusitis with sinus congestion hearing loss and ear pressure as well as sinus pressure  Review of Systems  Constitutional: Negative for activity change, appetite change and fatigue.  HENT: Positive for ear pain, congestion, rhinorrhea and postnasal drip. Negative for neck pain and sinus pressure.   Eyes: Negative for redness and visual disturbance.  Respiratory: Positive for cough. Negative for shortness of breath and wheezing.   Gastrointestinal: Negative for abdominal pain and abdominal distention.  Genitourinary: Negative for dysuria, frequency and menstrual problem.  Musculoskeletal: Negative for myalgias, joint swelling and arthralgias.  Skin: Negative for rash and wound.  Neurological: Negative for dizziness, weakness and headaches.  Hematological: Negative for adenopathy. Does not bruise/bleed easily.  Psychiatric/Behavioral: Negative for sleep disturbance and decreased concentration.   Past Medical History  Diagnosis Date  . HYPERLIPIDEMIA 03/07/2008  . HYPERTENSION 08/03/2007  . CORONARY ARTERY DISEASE 08/03/2007  . Acute on chronic systolic heart failure 05/23/2009  . HYPOTHYROIDISM 08/03/2007  . Esophageal reflux 10/18/2007  . DIVERTICULOSIS, COLON 08/03/2007  . DISORDER, BLADDER NEC 08/03/2007  . BREAST MASS, BENIGN 05/23/2009  . Edema 02/19/2009  . BARRETT'S ESOPHAGUS, HX OF 08/03/2007  . Interstitial cystitis   . Arthritis   . Internal hemorrhoids   . Epistaxis   . Right hand fracture   . Diarrhea   . Bladder disorder   . Myocardial infarction   . Anginal pain   . Dysrhythmia 08/2012   symptomatic bradycardia  . Shortness of breath     History   Social History  . Marital Status: Married    Spouse Name: N/A    Number of Children: N/A  . Years of Education: N/A   Occupational History  . retired    Social History Main Topics  . Smoking status: Former Smoker -- 1.0 packs/day for 15 years    Types: Cigarettes    Quit date: 11/10/1969  . Smokeless tobacco: Never Used  . Alcohol Use: No  . Drug Use: No  . Sexually Active: No   Other Topics Concern  . Not on file   Social History Narrative  . No narrative on file    Past Surgical History  Procedure Date  . Breast surgery     benign mass  . Cholecystectomy   . Cesarean section   . Appendectomy   . Cataract extraction, bilateral   . Tonsillectomy   . Abdominal hysterectomy   . Ankle reconstruction     Left  . Wrist reconstruction     Right    Family History  Problem Relation Age of Onset  . Colon cancer Mother   . Coronary artery disease Mother   . Heart disease Mother   . Coronary artery disease Father   . Heart disease Father   . Colon polyps Sister   . Coronary artery disease Sister   . Coronary artery disease Brother   . Diabetes type II Son     Allergies  Allergen Reactions  . Codeine   . Sulfonamide Derivatives     Current Outpatient Prescriptions on File Prior to Visit  Medication Sig Dispense Refill  . amLODipine-valsartan (EXFORGE) 5-320 MG per tablet Take 1 tablet by mouth daily.  90 tablet  3  . aspirin 81 MG tablet Take 81 mg by mouth daily.        . Calcium Carbonate-Vitamin D (CALCIUM 600+D) 600-200 MG-UNIT TABS Take by mouth 2 (two) times daily.        . furosemide (LASIX) 40 MG tablet Take 1 tablet (40 mg total) by mouth daily.  90 tablet  3  . HYDROcodone-acetaminophen (NORCO/VICODIN) 5-325 MG per tablet Take 1 tablet by mouth every 4 (four) hours as needed.      . isosorbide mononitrate (IMDUR) 60 MG 24 hr tablet Take 1.5 tablets (90 mg total) by mouth daily. Pt  request west-wards tabs  45 tablet  3  . lansoprazole (PREVACID) 30 MG capsule Take 1 capsule (30 mg total) by mouth 2 (two) times daily.  180 capsule  3  . levothyroxine (SYNTHROID, LEVOTHROID) 50 MCG tablet Take 50 mcg by mouth daily.      . Multiple Vitamin (MULTIVITAMIN) tablet Take 1 tablet by mouth daily.        . nitroGLYCERIN (NITROSTAT) 0.4 MG SL tablet Place 1 tablet (0.4 mg total) under the tongue every 5 (five) minutes as needed. If pain after 3 pills- call 911  90 tablet  3  . pentosan polysulfate (ELMIRON) 100 MG capsule Take 100 mg by mouth 3 (three) times daily before meals.        . potassium chloride (K-DUR,KLOR-CON) 10 MEQ tablet Take 10 mEq by mouth daily.      . pravastatin (PRAVACHOL) 40 MG tablet TAKE 1 TABLET DAILY.  90 tablet  3    BP 150/74  Pulse 72  Temp 98.2 F (36.8 C)  Resp 16  Ht 5\' 3"  (1.6 m)  Wt 156 lb (70.761 kg)  BMI 27.63 kg/m2       Objective:   Physical Exam  Nursing note and vitals reviewed. Constitutional: She is oriented to person, place, and time. She appears well-developed and well-nourished. No distress.  HENT:  Head: Normocephalic and atraumatic.  Mouth/Throat: Oropharyngeal exudate present.  Eyes: Conjunctivae normal and EOM are normal. Pupils are equal, round, and reactive to light.  Neck: Normal range of motion. Neck supple. No JVD present. No tracheal deviation present. No thyromegaly present.  Cardiovascular: Normal rate, regular rhythm and intact distal pulses.   Murmur heard. Pulmonary/Chest: Effort normal. She has wheezes. She exhibits no tenderness.  Abdominal: Soft. Bowel sounds are normal.  Musculoskeletal: Normal range of motion. She exhibits no edema and no tenderness.  Lymphadenopathy:    She has no cervical adenopathy.  Neurological: She is alert and oriented to person, place, and time. She has normal reflexes. No cranial nerve deficit.  Skin: Skin is warm and dry. She is not diaphoretic.  Psychiatric: She has a  normal mood and affect. Her behavior is normal.          Assessment & Plan:  The patient was placed on amoxicillin for several days but has still had purulent discharge from her sinuses indicating some resistance.  She was discharged from the hospital for syncopal episode and her blood pressure medicines were changed she brings with her blood pressure and a pulse as well as await reading showing that her blood pressures averaging around 150 given the new indications for blood pressure and her age I believe that this is acceptable blood pressure for her and she has had no  recurrent heart failure nor any recurrent syncope  For sinus we will treated for an atypical with Levaquin for 7 day protocol

## 2012-11-15 NOTE — Addendum Note (Signed)
Addended by: Willy Eddy on: 11/15/2012 01:27 PM   Modules accepted: Orders

## 2013-01-10 ENCOUNTER — Other Ambulatory Visit: Payer: Self-pay | Admitting: Family

## 2013-02-02 ENCOUNTER — Encounter: Payer: Self-pay | Admitting: Cardiology

## 2013-02-02 ENCOUNTER — Ambulatory Visit (INDEPENDENT_AMBULATORY_CARE_PROVIDER_SITE_OTHER): Payer: Medicare Other | Admitting: Cardiology

## 2013-02-02 VITALS — BP 130/72 | HR 78 | Ht 63.0 in | Wt 155.2 lb

## 2013-02-02 DIAGNOSIS — I5023 Acute on chronic systolic (congestive) heart failure: Secondary | ICD-10-CM | POA: Diagnosis not present

## 2013-02-02 DIAGNOSIS — I10 Essential (primary) hypertension: Secondary | ICD-10-CM | POA: Diagnosis not present

## 2013-02-02 DIAGNOSIS — I2510 Atherosclerotic heart disease of native coronary artery without angina pectoris: Secondary | ICD-10-CM | POA: Diagnosis not present

## 2013-02-02 DIAGNOSIS — I5030 Unspecified diastolic (congestive) heart failure: Secondary | ICD-10-CM | POA: Diagnosis not present

## 2013-02-02 NOTE — Patient Instructions (Addendum)
The current medical regimen is effective;  continue present plan and medications.  Follow up in 6 months with Dr Hochrein.  You will receive a letter in the mail 2 months before you are due.  Please call us when you receive this letter to schedule your follow up appointment.  

## 2013-02-02 NOTE — Progress Notes (Signed)
HPI The patient presents for followup of syncope last fall.  Since I last saw her she is doing OK.  Her son had an MI and her husband started dialysis.  She has stress.  However she has had no new cardiovascular symptoms.  She has had no further episodes of syncope. She's had no new shortness of breath, PND or orthopnea. She does rarely get some am dizziness but this passes quickly.  She does rarely get some chest pressure and has take a few NTG since I have last seen her.  She has had no neck or arm discomfort. She's not having any PND or orthopnea. She has had no palpitations.  Allergies  Allergen Reactions  . Codeine   . Sulfonamide Derivatives     Current Outpatient Prescriptions  Medication Sig Dispense Refill  . amLODipine-valsartan (EXFORGE) 5-320 MG per tablet Take 1 tablet by mouth daily.  90 tablet  3  . aspirin 81 MG tablet Take 81 mg by mouth daily.        . Calcium Carbonate-Vitamin D (CALCIUM 600+D) 600-200 MG-UNIT TABS Take by mouth 2 (two) times daily.        . furosemide (LASIX) 40 MG tablet Take 1 tablet (40 mg total) by mouth daily.  90 tablet  3  . HYDROcodone-acetaminophen (NORCO/VICODIN) 5-325 MG per tablet Take 1 tablet by mouth every 4 (four) hours as needed.      . isosorbide mononitrate (IMDUR) 60 MG 24 hr tablet Take 1.5 tablets (90 mg total) by mouth daily. Pt request west-wards tabs  45 tablet  11  . lansoprazole (PREVACID) 30 MG capsule Take 1 capsule (30 mg total) by mouth 2 (two) times daily.  180 capsule  3  . levothyroxine (SYNTHROID, LEVOTHROID) 50 MCG tablet Take 50 mcg by mouth daily.      . Multiple Vitamin (MULTIVITAMIN) tablet Take 1 tablet by mouth daily.        . nitroGLYCERIN (NITROSTAT) 0.4 MG SL tablet Place 1 tablet (0.4 mg total) under the tongue every 5 (five) minutes as needed. If pain after 3 pills- call 911  30 tablet  3  . pentosan polysulfate (ELMIRON) 100 MG capsule Take 100 mg by mouth 3 (three) times daily before meals.        .  potassium chloride (K-DUR,KLOR-CON) 10 MEQ tablet Take 10 mEq by mouth daily.      . pravastatin (PRAVACHOL) 40 MG tablet TAKE 1 TABLET DAILY.  90 tablet  3   No current facility-administered medications for this visit.    Past Medical History  Diagnosis Date  . HYPERLIPIDEMIA 03/07/2008  . HYPERTENSION 08/03/2007  . CORONARY ARTERY DISEASE 08/03/2007  . Acute on chronic systolic heart failure 05/23/2009  . HYPOTHYROIDISM 08/03/2007  . Esophageal reflux 10/18/2007  . DIVERTICULOSIS, COLON 08/03/2007  . DISORDER, BLADDER NEC 08/03/2007  . BREAST MASS, BENIGN 05/23/2009  . Edema 02/19/2009  . BARRETT'S ESOPHAGUS, HX OF 08/03/2007  . Interstitial cystitis   . Arthritis   . Internal hemorrhoids   . Epistaxis   . Right hand fracture   . Diarrhea   . Bladder disorder   . Myocardial infarction   . Anginal pain   . Dysrhythmia 08/2012    symptomatic bradycardia  . Shortness of breath     Past Surgical History  Procedure Laterality Date  . Breast surgery      benign mass  . Cholecystectomy    . Cesarean section    . Appendectomy    .  Cataract extraction, bilateral    . Tonsillectomy    . Abdominal hysterectomy    . Ankle reconstruction      Left  . Wrist reconstruction      Right    ROS:  Otherwise as stated in the HPI and negative for all other systems.  PHYSICAL EXAM BP 130/72  Pulse 78  Ht 5\' 3"  (1.6 m)  Wt 155 lb 3.2 oz (70.398 kg)  BMI 27.5 kg/m2 GENERAL:  Well appearing HEENT:  Pupils equal round and reactive, fundi not visualized, oral mucosa unremarkable NECK:  No jugular venous distention, waveform within normal limits, carotid upstroke brisk and symmetric, no bruits, no thyromegaly LUNGS:  Decreased breath sounds in the left lung base with few basilar crackles. BACK:  No CVA tenderness CHEST: Unremarkable HEART:  PMI not displaced or sustained,S1 and S2 within normal limits, no S3, no S4, no clicks, no rubs, no murmurs ABD:  Flat, positive bowel sounds normal in  frequency in pitch, no bruits, no rebound, no guarding, no midline pulsatile mass, no hepatomegaly, no splenomegaly EXT:  2 plus pulses throughout, no edema, no cyanosis no clubbing   ASSESSMENT AND PLAN  CORONARY ARTERY DISEASE - Her chest pain is atypical and unlikely to be angina. It is not different than symptoms she had at the time of her last catheterization. Therefore, no further imaging is available. She will let me know if she has any worsening symptoms.  HYPERTENSION -  I reviewed her blood pressure diary today. Her readings are very slightly above 140 systolic frequently but sometimes below. Given her recent episode of syncope and lightheadedness I would prefer not to titrate her medications. She will however continue to keep a blood pressure diary and I will make changes as needed.   Diastolic HF (heart failure) - She seems to be euvolemic. No change in therapy is indicated.  No further cardiovascular testing is indicated.   SYNCOPE -  The patient has had no further episodes of syncope. At this point no further workup is suggested.

## 2013-02-03 ENCOUNTER — Other Ambulatory Visit: Payer: Self-pay

## 2013-02-03 MED ORDER — ISOSORBIDE MONONITRATE ER 60 MG PO TB24: 90.0000 mg | ORAL_TABLET | Freq: Every day | ORAL | Status: DC

## 2013-02-14 ENCOUNTER — Encounter: Payer: Self-pay | Admitting: Internal Medicine

## 2013-02-14 ENCOUNTER — Ambulatory Visit (INDEPENDENT_AMBULATORY_CARE_PROVIDER_SITE_OTHER): Payer: Medicare Other | Admitting: Internal Medicine

## 2013-02-14 VITALS — BP 140/82 | HR 72 | Temp 97.9°F | Resp 16 | Ht 63.0 in | Wt 154.0 lb

## 2013-02-14 DIAGNOSIS — M5416 Radiculopathy, lumbar region: Secondary | ICD-10-CM

## 2013-02-14 DIAGNOSIS — I10 Essential (primary) hypertension: Secondary | ICD-10-CM | POA: Diagnosis not present

## 2013-02-14 DIAGNOSIS — E785 Hyperlipidemia, unspecified: Secondary | ICD-10-CM

## 2013-02-14 DIAGNOSIS — T887XXA Unspecified adverse effect of drug or medicament, initial encounter: Secondary | ICD-10-CM | POA: Diagnosis not present

## 2013-02-14 DIAGNOSIS — K219 Gastro-esophageal reflux disease without esophagitis: Secondary | ICD-10-CM | POA: Diagnosis not present

## 2013-02-14 DIAGNOSIS — IMO0002 Reserved for concepts with insufficient information to code with codable children: Secondary | ICD-10-CM

## 2013-02-14 DIAGNOSIS — E039 Hypothyroidism, unspecified: Secondary | ICD-10-CM

## 2013-02-14 LAB — LIPID PANEL: Cholesterol: 153 mg/dL (ref 0–200)

## 2013-02-14 LAB — COMPREHENSIVE METABOLIC PANEL
AST: 18 U/L (ref 0–37)
Albumin: 3.9 g/dL (ref 3.5–5.2)
BUN: 14 mg/dL (ref 6–23)
Calcium: 9.2 mg/dL (ref 8.4–10.5)
Chloride: 105 mEq/L (ref 96–112)
Glucose, Bld: 87 mg/dL (ref 70–99)
Potassium: 3.8 mEq/L (ref 3.5–5.1)
Sodium: 140 mEq/L (ref 135–145)
Total Protein: 7.5 g/dL (ref 6.0–8.3)

## 2013-02-14 MED ORDER — LANSOPRAZOLE 30 MG PO CPDR: 30.0000 mg | DELAYED_RELEASE_CAPSULE | Freq: Two times a day (BID) | ORAL | Status: DC

## 2013-02-14 MED ORDER — AMLODIPINE BESYLATE-VALSARTAN 10-320 MG PO TABS: 1.0000 | ORAL_TABLET | Freq: Every day | ORAL | Status: DC

## 2013-02-14 NOTE — Patient Instructions (Addendum)
Changing  exforge from 5/320 to 10/320 should help both with the blood pressure and with the angina.

## 2013-02-14 NOTE — Progress Notes (Signed)
Subjective:    Patient ID: Victoria Gomez, female    DOB: June 27, 1925, 79 y.o.   MRN: [id number]  HPI  Patient presents for followup with a history of hypertension hypothyroidism and chronic heart failure she has widely varying blood pressures as high as 170 and as low as 124 systolic with most of her diastolic blood pressures ranged from mid 70s to upper 60s. Has been having occasional chest pain and edema in her feet  Review of Systems  Constitutional: Positive for appetite change and unexpected weight change. Negative for activity change and fatigue.  HENT: Negative for ear pain, congestion, neck pain, postnasal drip and sinus pressure.   Eyes: Negative for redness and visual disturbance.  Respiratory: Positive for shortness of breath. Negative for cough and wheezing.   Cardiovascular: Positive for chest pain and leg swelling.  Gastrointestinal: Negative for abdominal pain and abdominal distention.  Genitourinary: Positive for dysuria and frequency. Negative for menstrual problem.  Musculoskeletal: Negative for myalgias, joint swelling and arthralgias.  Skin: Negative for rash and wound.  Neurological: Negative for dizziness, weakness and headaches.  Hematological: Negative for adenopathy. Does not bruise/bleed easily.  Psychiatric/Behavioral: Negative for sleep disturbance and decreased concentration.   Past Medical History  Diagnosis Date  . HYPERLIPIDEMIA 03/07/2008  . HYPERTENSION 08/03/2007  . CORONARY ARTERY DISEASE 08/03/2007  . Acute on chronic systolic heart failure 05/23/2009  . HYPOTHYROIDISM 08/03/2007  . Esophageal reflux 10/18/2007  . DIVERTICULOSIS, COLON 08/03/2007  . DISORDER, BLADDER NEC 08/03/2007  . BREAST MASS, BENIGN 05/23/2009  . Edema 02/19/2009  . BARRETT'S ESOPHAGUS, HX OF 08/03/2007  . Interstitial cystitis   . Arthritis   . Internal hemorrhoids   . Epistaxis   . Right hand fracture   . Diarrhea   . Bladder disorder   . Myocardial infarction   . Anginal  pain   . Dysrhythmia 08/2012    symptomatic bradycardia  . Shortness of breath     History   Social History  . Marital Status: Married    Spouse Name: N/A    Number of Children: N/A  . Years of Education: N/A   Occupational History  . retired    Social History Main Topics  . Smoking status: Former Smoker -- 1.00 packs/day for 15 years    Types: Cigarettes    Quit date: 11/10/1969  . Smokeless tobacco: Never Used  . Alcohol Use: No  . Drug Use: No  . Sexually Active: No   Other Topics Concern  . Not on file   Social History Narrative  . No narrative on file    Past Surgical History  Procedure Laterality Date  . Breast surgery      benign mass  . Cholecystectomy    . Cesarean section    . Appendectomy    . Cataract extraction, bilateral    . Tonsillectomy    . Abdominal hysterectomy    . Ankle reconstruction      Left  . Wrist reconstruction      Right    Family History  Problem Relation Age of Onset  . Colon cancer Mother   . Coronary artery disease Mother   . Heart disease Mother   . Coronary artery disease Father   . Heart disease Father   . Colon polyps Sister   . Coronary artery disease Sister   . Coronary artery disease Brother   . Diabetes type II Son     Allergies  Allergen Reactions  . Codeine   .  Sulfonamide Derivatives     Current Outpatient Prescriptions on File Prior to Visit  Medication Sig Dispense Refill  . aspirin 81 MG tablet Take 81 mg by mouth daily.        . Calcium Carbonate-Vitamin D (CALCIUM 600+D) 600-200 MG-UNIT TABS Take by mouth 2 (two) times daily.        . furosemide (LASIX) 40 MG tablet Take 1 tablet (40 mg total) by mouth daily.  90 tablet  3  . HYDROcodone-acetaminophen (NORCO/VICODIN) 5-325 MG per tablet Take 1 tablet by mouth every 4 (four) hours as needed.      [name] levothyroxine (SYNTHROID, LEVOTHROID) 50 MCG tablet Take 50 mcg by mouth daily.      . Multiple Vitamin (MULTIVITAMIN) tablet Take 1 tablet by mouth  daily.        . nitroGLYCERIN (NITROSTAT) 0.4 MG SL tablet Place 1 tablet (0.4 mg total) under the tongue every 5 (five) minutes as needed. If pain after 3 pills- call 911  30 tablet  3  . pentosan polysulfate (ELMIRON) 100 MG capsule Take 100 mg by mouth 3 (three) times daily before meals.        . potassium chloride (K-DUR,KLOR-CON) 10 MEQ tablet Take 10 mEq by mouth daily.      . pravastatin (PRAVACHOL) 40 MG tablet TAKE 1 TABLET DAILY.  90 tablet  3  . isosorbide mononitrate (IMDUR) 60 MG 24 hr tablet Take 1.5 tablets (90 mg total) by mouth daily. Pt request west-wards tabs  135 tablet  2   No current facility-administered medications on file prior to visit.    BP 140/82  Pulse 72  Temp(Src) 97.9 F (36.6 C)  Resp 16  Ht 5\' 3"  (1.6 m)  Wt 154 lb (69.854 kg)  BMI 27.29 kg/m2        Objective:   Physical Exam  Nursing note reviewed. Constitutional: She is oriented to person, place, and time. She appears well-developed and well-nourished.  Eyes: Conjunctivae are normal. Pupils are equal, round, and reactive to light.  Cardiovascular: Normal rate and intact distal pulses.   Murmur heard. Abdominal: Soft.  Musculoskeletal: She exhibits edema and tenderness.  Neurological: She is alert and oriented to person, place, and time.  Psychiatric: She has a normal mood and affect. Her behavior is normal.          Assessment & Plan:  We'll change the exforge to the 10-20

## 2013-02-22 DIAGNOSIS — M5137 Other intervertebral disc degeneration, lumbosacral region: Secondary | ICD-10-CM | POA: Diagnosis not present

## 2013-02-23 ENCOUNTER — Other Ambulatory Visit (HOSPITAL_COMMUNITY): Payer: Self-pay | Admitting: Specialist

## 2013-02-23 ENCOUNTER — Ambulatory Visit (HOSPITAL_COMMUNITY)
Admission: RE | Admit: 2013-02-23 | Discharge: 2013-02-23 | Disposition: A | Discharge status: Home / Self Care | Payer: Medicare Other | Source: Ambulatory Visit | Attending: Specialist | Admitting: Specialist

## 2013-02-23 DIAGNOSIS — M25572 Pain in left ankle and joints of left foot: Secondary | ICD-10-CM

## 2013-02-23 DIAGNOSIS — M25472 Effusion, left ankle: Secondary | ICD-10-CM

## 2013-02-23 DIAGNOSIS — M7989 Other specified soft tissue disorders: Secondary | ICD-10-CM | POA: Insufficient documentation

## 2013-02-23 DIAGNOSIS — M25579 Pain in unspecified ankle and joints of unspecified foot: Secondary | ICD-10-CM | POA: Diagnosis not present

## 2013-02-23 NOTE — Progress Notes (Signed)
*  PRELIMINARY RESULTS* Vascular Ultrasound Left lower extremity venous duplex has been completed.  Preliminary findings: Left= no evidence of DVT or baker's cyst.  Called report to Dr. Thomasena Edis' office.  Farrel Demark, RDMS, RVT  02/23/2013, 3:20 PM

## 2013-03-04 DIAGNOSIS — M47817 Spondylosis without myelopathy or radiculopathy, lumbosacral region: Secondary | ICD-10-CM | POA: Diagnosis not present

## 2013-04-26 ENCOUNTER — Emergency Department (HOSPITAL_COMMUNITY): Payer: Medicare Other

## 2013-04-26 ENCOUNTER — Encounter (HOSPITAL_COMMUNITY): Payer: Self-pay

## 2013-04-26 ENCOUNTER — Observation Stay (HOSPITAL_COMMUNITY)
Admission: EM | Admit: 2013-04-26 | Discharge: 2013-04-27 | Disposition: A | Discharge status: Home / Self Care | Payer: Medicare Other | Attending: Internal Medicine | Admitting: Internal Medicine

## 2013-04-26 ENCOUNTER — Observation Stay (HOSPITAL_COMMUNITY): Payer: Medicare Other

## 2013-04-26 DIAGNOSIS — I10 Essential (primary) hypertension: Secondary | ICD-10-CM

## 2013-04-26 DIAGNOSIS — K5730 Diverticulosis of large intestine without perforation or abscess without bleeding: Secondary | ICD-10-CM

## 2013-04-26 DIAGNOSIS — N3010 Interstitial cystitis (chronic) without hematuria: Secondary | ICD-10-CM

## 2013-04-26 DIAGNOSIS — R05 Cough: Secondary | ICD-10-CM

## 2013-04-26 DIAGNOSIS — Z8719 Personal history of other diseases of the digestive system: Secondary | ICD-10-CM

## 2013-04-26 DIAGNOSIS — E039 Hypothyroidism, unspecified: Secondary | ICD-10-CM | POA: Diagnosis not present

## 2013-04-26 DIAGNOSIS — R079 Chest pain, unspecified: Secondary | ICD-10-CM | POA: Diagnosis not present

## 2013-04-26 DIAGNOSIS — J449 Chronic obstructive pulmonary disease, unspecified: Secondary | ICD-10-CM | POA: Diagnosis not present

## 2013-04-26 DIAGNOSIS — M5416 Radiculopathy, lumbar region: Secondary | ICD-10-CM

## 2013-04-26 DIAGNOSIS — I5032 Chronic diastolic (congestive) heart failure: Secondary | ICD-10-CM

## 2013-04-26 DIAGNOSIS — R072 Precordial pain: Secondary | ICD-10-CM | POA: Diagnosis not present

## 2013-04-26 DIAGNOSIS — R609 Edema, unspecified: Secondary | ICD-10-CM

## 2013-04-26 DIAGNOSIS — I25119 Atherosclerotic heart disease of native coronary artery with unspecified angina pectoris: Secondary | ICD-10-CM | POA: Diagnosis present

## 2013-04-26 DIAGNOSIS — E785 Hyperlipidemia, unspecified: Secondary | ICD-10-CM

## 2013-04-26 DIAGNOSIS — R55 Syncope and collapse: Secondary | ICD-10-CM

## 2013-04-26 DIAGNOSIS — I2510 Atherosclerotic heart disease of native coronary artery without angina pectoris: Secondary | ICD-10-CM

## 2013-04-26 DIAGNOSIS — E876 Hypokalemia: Secondary | ICD-10-CM

## 2013-04-26 DIAGNOSIS — T887XXA Unspecified adverse effect of drug or medicament, initial encounter: Secondary | ICD-10-CM

## 2013-04-26 DIAGNOSIS — Z9889 Other specified postprocedural states: Secondary | ICD-10-CM

## 2013-04-26 DIAGNOSIS — K219 Gastro-esophageal reflux disease without esophagitis: Secondary | ICD-10-CM

## 2013-04-26 DIAGNOSIS — I509 Heart failure, unspecified: Secondary | ICD-10-CM | POA: Diagnosis not present

## 2013-04-26 DIAGNOSIS — Z79899 Other long term (current) drug therapy: Secondary | ICD-10-CM | POA: Insufficient documentation

## 2013-04-26 DIAGNOSIS — M129 Arthropathy, unspecified: Secondary | ICD-10-CM

## 2013-04-26 DIAGNOSIS — M25579 Pain in unspecified ankle and joints of unspecified foot: Secondary | ICD-10-CM | POA: Diagnosis not present

## 2013-04-26 DIAGNOSIS — R5383 Other fatigue: Secondary | ICD-10-CM

## 2013-04-26 DIAGNOSIS — R059 Cough, unspecified: Secondary | ICD-10-CM

## 2013-04-26 DIAGNOSIS — J42 Unspecified chronic bronchitis: Secondary | ICD-10-CM | POA: Diagnosis not present

## 2013-04-26 LAB — CBC
HCT: 37.2 % (ref 36.0–46.0)
HCT: 35.8 % — ABNORMAL LOW (ref 36.0–46.0)
Hemoglobin: 12.7 g/dL (ref 12.0–15.0)
Hemoglobin: 12.1 g/dL (ref 12.0–15.0)
MCH: 29.2 pg (ref 26.0–34.0)
MCHC: 33.8 g/dL (ref 30.0–36.0)
WBC: 8.7 10*3/uL (ref 4.0–10.5)

## 2013-04-26 LAB — BASIC METABOLIC PANEL
BUN: 15 mg/dL (ref 6–23)
Chloride: 106 mEq/L (ref 96–112)
Glucose, Bld: 109 mg/dL — ABNORMAL HIGH (ref 70–99)
Potassium: 3.2 mEq/L — ABNORMAL LOW (ref 3.5–5.1)

## 2013-04-26 LAB — CREATININE, SERUM: GFR calc non Af Amer: 51 mL/min — ABNORMAL LOW (ref >90)

## 2013-04-26 LAB — TROPONIN I: Troponin I: <0.3 ng/mL (ref <0.30)

## 2013-04-26 LAB — POCT I-STAT TROPONIN I

## 2013-04-26 IMAGING — CR DG CHEST 2V
2 series · 2 of 2 positions shown · non-contrast
Comparison: 08/30/2012.

CLINICAL DATA: Shortness of breath.  History of deep venous
thrombosis.  Increased D-dimer level.

CHEST - 2 VIEW

[w chest pa]
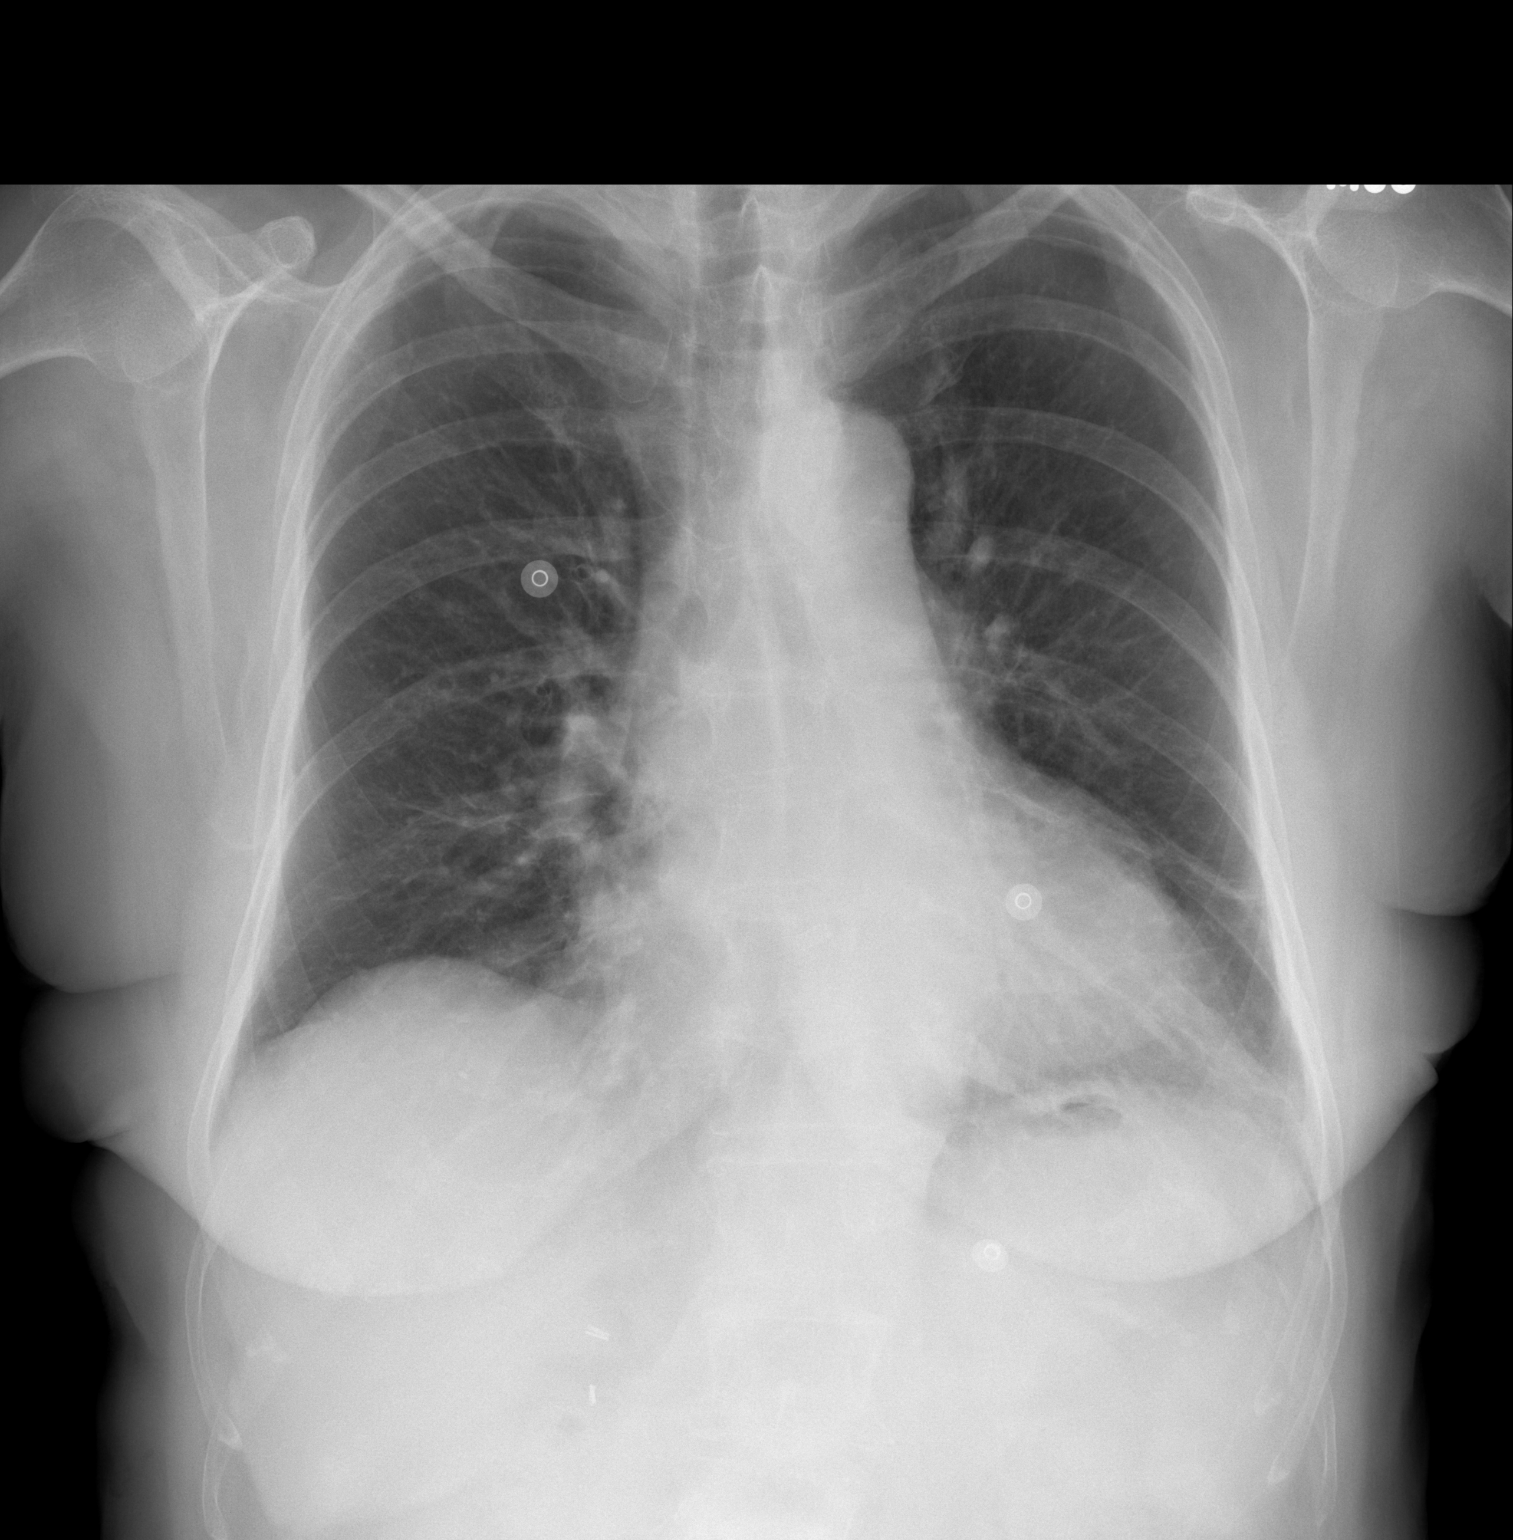

[w chest lat]
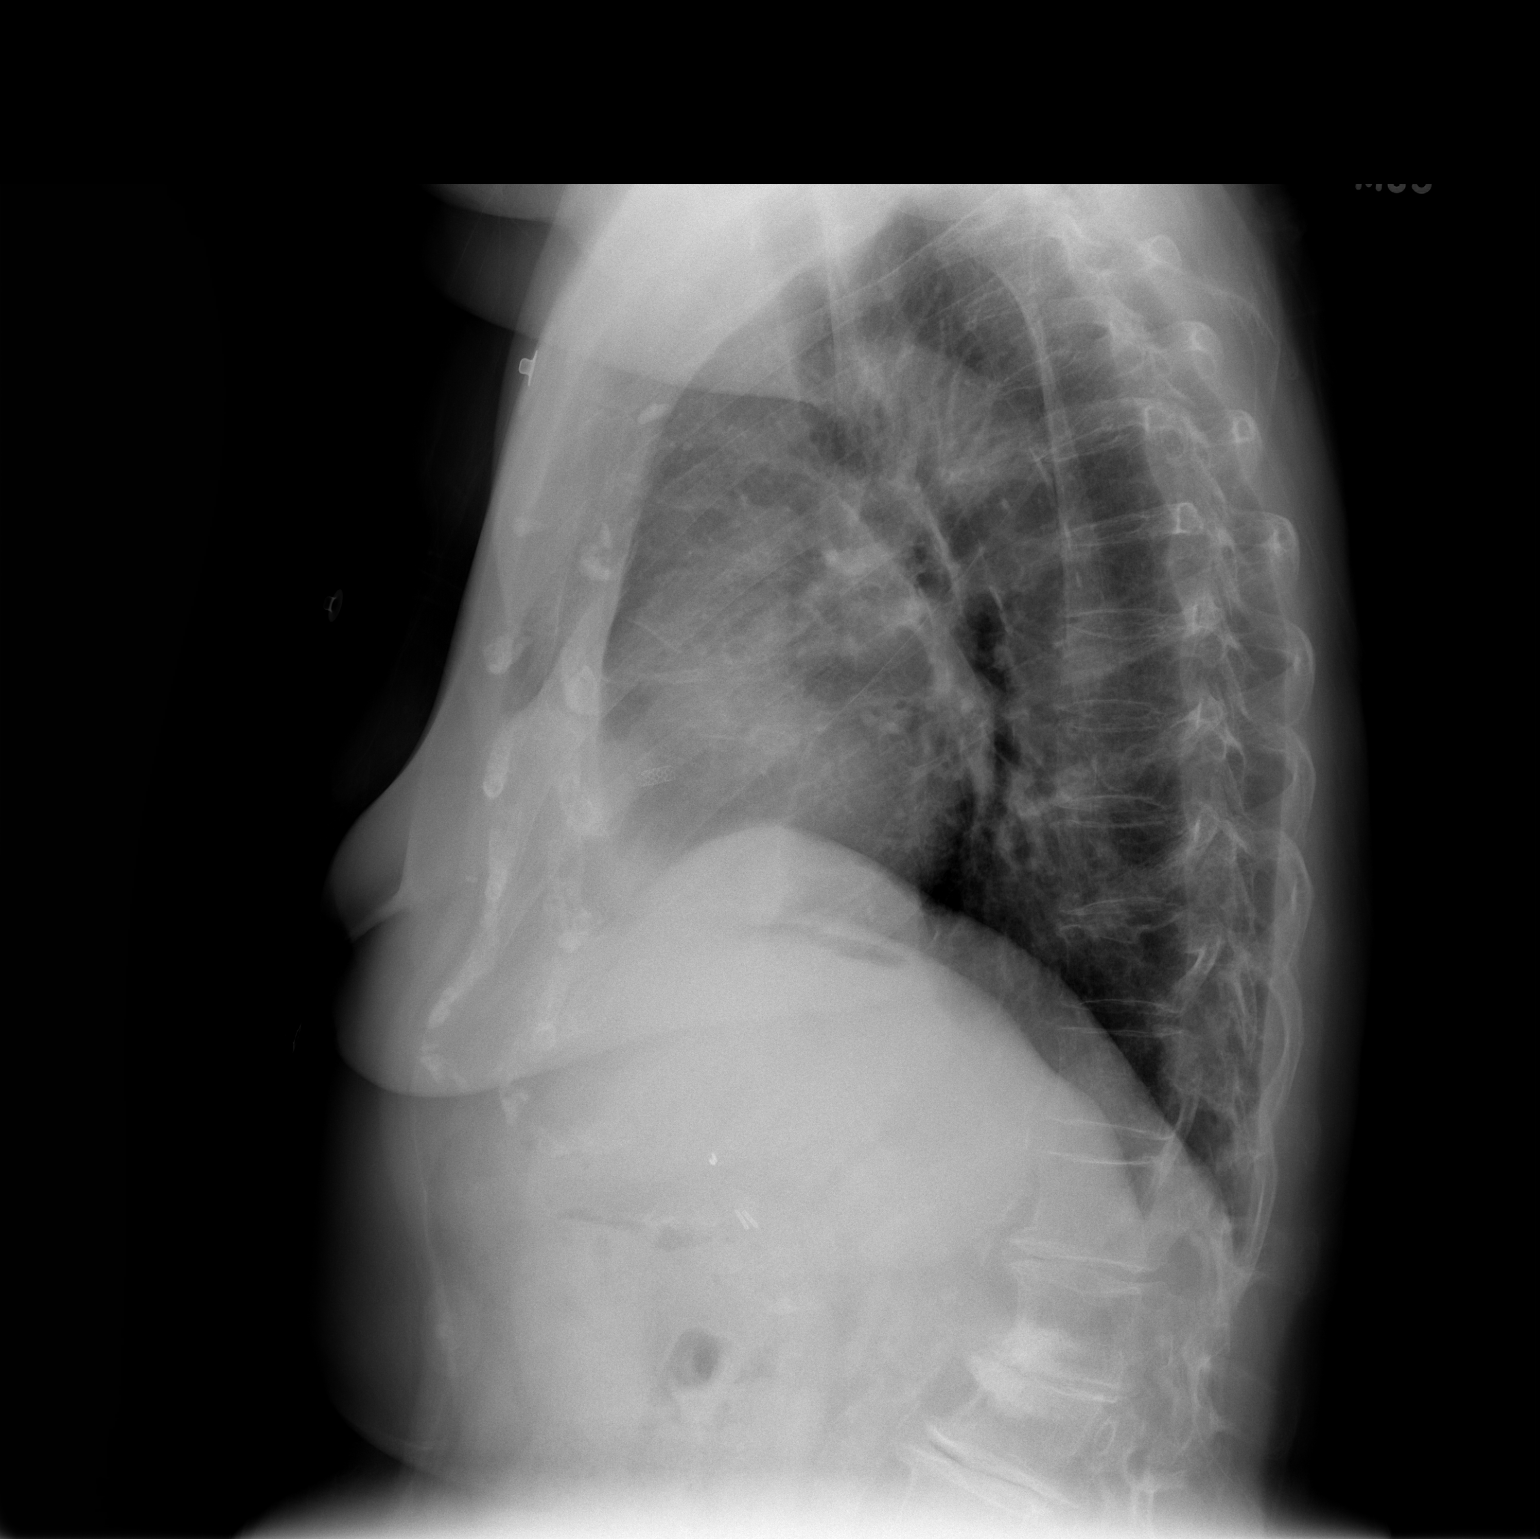

[2 of 2 positions shown; findings below may reference images not displayed]

FINDINGS: There is stable moderate enlargement of the cardiac
silhouette. Ectasia and nonaneurysmal calcification of the thoracic
aorta are seen.  Mediastinal and hilar contours appear stable.  No
pulmonary edema, pneumonia, or pleural effusion is evident.  There
is minimal subsegmental atelectasis in the lower left midlung area.
There is stable minimal apical pleural thickening.  There is a
scoliosis convexity to the left.  Changes of degenerative disc
disease and degenerative spondylosis are seen.  The compression
fracture of the superior aspect of the T11 vertebral body appears
stable.  The severe degenerative disc disease changes at the level
of L2-L3 appear stable.
IMPRESSION: Stable moderate cardiac silhouette enlargement.  No pulmonary
edema, pneumonia, or pleural effusion.  Minimal subsegmental
atelectasis and lower left midlung area.  Chronic findings are
described above.

## 2013-04-26 MED ORDER — ADULT MULTIVITAMIN W/MINERALS CH
1.0000 | ORAL_TABLET | Freq: Every day | ORAL | Status: DC
  Administered 2013-04-26 – 2013-04-27 (×2): 1 via ORAL
  Filled 2013-04-26 (×2): qty 1

## 2013-04-26 MED ORDER — ONDANSETRON HCL 4 MG/2ML IJ SOLN: 4.0000 mg | Freq: Four times a day (QID) | INTRAMUSCULAR | Status: DC | PRN

## 2013-04-26 MED ORDER — AMLODIPINE BESYLATE-VALSARTAN 10-320 MG PO TABS: 1.0000 | ORAL_TABLET | Freq: Every day | ORAL | Status: DC

## 2013-04-26 MED ORDER — ONDANSETRON HCL 4 MG/2ML IJ SOLN
4.0000 mg | Freq: Three times a day (TID) | INTRAMUSCULAR | Status: DC | PRN
  Filled 2013-04-26: qty 2

## 2013-04-26 MED ORDER — SODIUM CHLORIDE 0.9 % IJ SOLN
3.0000 mL | Freq: Two times a day (BID) | INTRAMUSCULAR | Status: DC
  Administered 2013-04-26 – 2013-04-27 (×2): 3 mL via INTRAVENOUS

## 2013-04-26 MED ORDER — POTASSIUM CHLORIDE CRYS ER 10 MEQ PO TBCR
10.0000 meq | EXTENDED_RELEASE_TABLET | Freq: Every day | ORAL | Status: DC
  Administered 2013-04-26 – 2013-04-27 (×2): 10 meq via ORAL
  Filled 2013-04-26 (×2): qty 1

## 2013-04-26 MED ORDER — ASPIRIN EC 325 MG PO TBEC
325.0000 mg | DELAYED_RELEASE_TABLET | Freq: Every day | ORAL | Status: DC
  Administered 2013-04-27: 325 mg via ORAL
  Filled 2013-04-26 (×2): qty 1

## 2013-04-26 MED ORDER — ONDANSETRON HCL 4 MG PO TABS
4.0000 mg | ORAL_TABLET | Freq: Four times a day (QID) | ORAL | Status: DC | PRN
  Administered 2013-04-27: 4 mg via ORAL
  Filled 2013-04-26: qty 1

## 2013-04-26 MED ORDER — GUAIFENESIN 100 MG/5ML PO SYRP
200.0000 mg | ORAL_SOLUTION | ORAL | Status: DC | PRN
  Administered 2013-04-26: 200 mg via ORAL
  Filled 2013-04-26 (×2): qty 10

## 2013-04-26 MED ORDER — SODIUM CHLORIDE 0.9 % IJ SOLN
3.0000 mL | Freq: Two times a day (BID) | INTRAMUSCULAR | Status: DC
  Administered 2013-04-27: 3 mL via INTRAVENOUS

## 2013-04-26 MED ORDER — LEVOTHYROXINE SODIUM 50 MCG PO TABS
50.0000 ug | ORAL_TABLET | Freq: Every day | ORAL | Status: DC
  Administered 2013-04-27: 50 ug via ORAL
  Filled 2013-04-26 (×2): qty 1

## 2013-04-26 MED ORDER — AMLODIPINE BESYLATE 10 MG PO TABS
10.0000 mg | ORAL_TABLET | Freq: Every day | ORAL | Status: DC
  Administered 2013-04-27: 10 mg via ORAL
  Filled 2013-04-26: qty 1

## 2013-04-26 MED ORDER — LEVOFLOXACIN IN D5W 750 MG/150ML IV SOLN
750.0000 mg | INTRAVENOUS | Status: DC
  Filled 2013-04-26: qty 150

## 2013-04-26 MED ORDER — SIMVASTATIN 20 MG PO TABS
20.0000 mg | ORAL_TABLET | Freq: Every day | ORAL | Status: DC
  Filled 2013-04-26: qty 1

## 2013-04-26 MED ORDER — ENOXAPARIN SODIUM 40 MG/0.4ML ~~LOC~~ SOLN
40.0000 mg | SUBCUTANEOUS | Status: DC
  Administered 2013-04-26: 40 mg via SUBCUTANEOUS
  Filled 2013-04-26 (×2): qty 0.4

## 2013-04-26 MED ORDER — ACETAMINOPHEN 650 MG RE SUPP: 650.0000 mg | Freq: Four times a day (QID) | RECTAL | Status: DC | PRN

## 2013-04-26 MED ORDER — FUROSEMIDE 40 MG PO TABS
40.0000 mg | ORAL_TABLET | Freq: Every day | ORAL | Status: DC
  Administered 2013-04-26 – 2013-04-27 (×2): 40 mg via ORAL
  Filled 2013-04-26 (×2): qty 1

## 2013-04-26 MED ORDER — LEVOFLOXACIN IN D5W 500 MG/100ML IV SOLN
500.0000 mg | Freq: Once | INTRAVENOUS | Status: AC
  Administered 2013-04-26: 500 mg via INTRAVENOUS
  Filled 2013-04-26: qty 100

## 2013-04-26 MED ORDER — PANTOPRAZOLE SODIUM 40 MG PO TBEC
40.0000 mg | DELAYED_RELEASE_TABLET | Freq: Every day | ORAL | Status: DC
  Administered 2013-04-26 – 2013-04-27 (×2): 40 mg via ORAL
  Filled 2013-04-26: qty 1

## 2013-04-26 MED ORDER — ACETAMINOPHEN 325 MG PO TABS: 650.0000 mg | ORAL_TABLET | Freq: Four times a day (QID) | ORAL | Status: DC | PRN

## 2013-04-26 MED ORDER — PENTOSAN POLYSULFATE SODIUM 100 MG PO CAPS
100.0000 mg | ORAL_CAPSULE | Freq: Three times a day (TID) | ORAL | Status: DC
  Administered 2013-04-27 (×2): 100 mg via ORAL
  Filled 2013-04-26 (×4): qty 1

## 2013-04-26 MED ORDER — IRBESARTAN 300 MG PO TABS
300.0000 mg | ORAL_TABLET | Freq: Every day | ORAL | Status: DC
  Administered 2013-04-27: 300 mg via ORAL
  Filled 2013-04-26: qty 1

## 2013-04-26 MED ORDER — ISOSORBIDE MONONITRATE ER 30 MG PO TB24
90.0000 mg | ORAL_TABLET | Freq: Every day | ORAL | Status: DC
  Administered 2013-04-26 – 2013-04-27 (×2): 90 mg via ORAL
  Filled 2013-04-26 (×2): qty 1

## 2013-04-26 MED ORDER — NITROGLYCERIN 0.4 MG SL SUBL: 0.4000 mg | SUBLINGUAL_TABLET | SUBLINGUAL | Status: DC | PRN

## 2013-04-26 MED ORDER — ASPIRIN 81 MG PO CHEW
324.0000 mg | CHEWABLE_TABLET | Freq: Once | ORAL | Status: AC
  Administered 2013-04-26: 324 mg via ORAL
  Filled 2013-04-26: qty 4

## 2013-04-26 NOTE — Progress Notes (Addendum)
ANTIBIOTIC CONSULT NOTE - INITIAL  Pharmacy Consult for levaquin Indication: bronchitis  Allergies  Allergen Reactions  . Codeine Hives and Nausea And Vomiting  . Sulfonamide Derivatives Swelling    Turns red    Patient Measurements: Height: 5\' 3"  (160 cm) Weight: 147 lb 11.2 oz (66.996 kg) IBW/kg (Calculated) : 52.4  Vital Signs: Temp: 99.3 F (37.4 C) (06/17 2200) Temp src: Oral (06/17 2200) BP: 139/61 mmHg (06/17 2200) Pulse Rate: 80 (06/17 2200) Intake/Output from previous day:   Intake/Output from this shift:    Labs:  Recent Labs  04/26/13 1810  WBC 8.7  HGB 12.7  PLT 225  CREATININE 1.07   Estimated Creatinine Clearance: 34.7 ml/min (by C-G formula based on Cr of 1.07). No results found for this basename: VANCOTROUGH, VANCOPEAK, VANCORANDOM, GENTTROUGH, GENTPEAK, GENTRANDOM, TOBRATROUGH, TOBRAPEAK, TOBRARND, AMIKACINPEAK, AMIKACINTROU, AMIKACIN,  in the last 72 hours   Microbiology: No results found for this or any previous visit (from the past 720 hour(s)).  Medical History: Past Medical History  Diagnosis Date  . HYPERLIPIDEMIA 03/07/2008  . HYPERTENSION 08/03/2007  . CORONARY ARTERY DISEASE 08/03/2007  . Acute on chronic systolic heart failure 05/23/2009  . HYPOTHYROIDISM 08/03/2007  . Esophageal reflux 10/18/2007  . DIVERTICULOSIS, COLON 08/03/2007  . DISORDER, BLADDER NEC 08/03/2007  . BREAST MASS, BENIGN 05/23/2009  . Edema 02/19/2009  . BARRETT'S ESOPHAGUS, HX OF 08/03/2007  . Interstitial cystitis   . Arthritis   . Internal hemorrhoids   . Epistaxis   . Right hand fracture   . Diarrhea   . Bladder disorder   . Myocardial infarction   . Anginal pain   . Dysrhythmia 08/2012    symptomatic bradycardia  . Shortness of breath     Medications:  Prescriptions prior to admission  Medication Sig Dispense Refill  . amLODipine-valsartan (EXFORGE) 10-320 MG per tablet Take 1 tablet by mouth daily.  90 tablet  3  . aspirin 81 MG tablet Take 81 mg by  mouth daily.        . Calcium Carbonate-Vitamin D (CALCIUM 600+D) 600-200 MG-UNIT TABS Take by mouth 2 (two) times daily.        . furosemide (LASIX) 40 MG tablet Take 1 tablet (40 mg total) by mouth daily.  90 tablet  3  . isosorbide mononitrate (IMDUR) 60 MG 24 hr tablet Take 1.5 tablets (90 mg total) by mouth daily. Pt request west-wards tabs  135 tablet  2  . lansoprazole (PREVACID) 30 MG capsule Take 1 capsule (30 mg total) by mouth 2 (two) times daily.  180 capsule  3  . levothyroxine (SYNTHROID, LEVOTHROID) 50 MCG tablet Take 50 mcg by mouth daily.      . Multiple Vitamin (MULTIVITAMIN) tablet Take 1 tablet by mouth daily.        . nitroGLYCERIN (NITROSTAT) 0.4 MG SL tablet Place 1 tablet (0.4 mg total) under the tongue every 5 (five) minutes as needed. If pain after 3 pills- call 911  30 tablet  3  . pentosan polysulfate (ELMIRON) 100 MG capsule Take 100 mg by mouth 3 (three) times daily before meals.        . potassium chloride (K-DUR,KLOR-CON) 10 MEQ tablet Take 10 mEq by mouth daily.      . pravastatin (PRAVACHOL) 40 MG tablet Take 40 mg by mouth every morning.       Assessment: 79 yo lady to start levaquin for bronchitis.  CrCl ~ 35 ml/min  Levaquin 500 mg IV given in ED  Goal of Therapy:  Eradication of infection  Plan:  Levaquin 750 mg IV q48 hours start tomorrow evening F/u renal function, cultures and clinical course.  Talbert Cage Poteet 04/26/2013,10:03 PM

## 2013-04-26 NOTE — ED Notes (Signed)
Pt c/o constant mid-sternum chest pain, dry cough, weakness, SOB, diaphoretic, and dizziness x2 days. Pt reports her cheat pain began radiating ub=nder her Left breast starting today. Pt also report a sinus infection one week ago that has now cleared up

## 2013-04-26 NOTE — H&P (Signed)
[facility] History and Physical  Victoria Gomez YNW:[id number] DOB: [birth year] DOA: 04/26/2013  Referring physician: ER physician. PCP: Carrie Mew, MD  Specialists: [facility] cardiology.  Chief Complaint: Chest pain and cough.  HPI: Victoria Gomez is a 79 y.o. female known history of CAD status post stenting presents with complaints of chest pain and cough. Patient states she's been having chest pain for last 3 days which was retrosternal pressure-like nonradiating. Addition patient also has been having cough nonproductive with subjective feeling of fever chills. Patient also has been feeling weak. Denies any nausea vomiting or abdominal pain or diarrhea. Since she had persistent symptoms patient presented to the ER. EKG chest x-ray and cardiac enzymes were unremarkable. Patient has no history of LBBB. Cardiologist on call was consulted. Patient has been admitted for further management. Patient has been having some right ankle pain after she dropped weight on it 3 days ago.  Review of Systems: As presented in the history of presenting illness, rest negative.  Past Medical History  Diagnosis Date  . HYPERLIPIDEMIA 03/07/2008  . HYPERTENSION 08/03/2007  . CORONARY ARTERY DISEASE 08/03/2007  . Acute on chronic systolic heart failure 05/23/2009  . HYPOTHYROIDISM 08/03/2007  . Esophageal reflux 10/18/2007  . DIVERTICULOSIS, COLON 08/03/2007  . DISORDER, BLADDER NEC 08/03/2007  . BREAST MASS, BENIGN 05/23/2009  . Edema 02/19/2009  . BARRETT'S ESOPHAGUS, HX OF 08/03/2007  . Interstitial cystitis   . Arthritis   . Internal hemorrhoids   . Epistaxis   . Right hand fracture   . Diarrhea   . Bladder disorder   . Myocardial infarction   . Anginal pain   . Dysrhythmia 08/2012    symptomatic bradycardia  . Shortness of breath    Past Surgical History  Procedure Laterality Date  . Breast surgery      benign mass  . Cholecystectomy    . Cesarean section    . Appendectomy    .  Cataract extraction, bilateral    . Tonsillectomy    . Abdominal hysterectomy    . Ankle reconstruction      Left  . Wrist reconstruction      Right   Social History:  reports that she quit smoking about 43 years ago. Her smoking use included Cigarettes. She has a 15 pack-year smoking history. She has never used smokeless tobacco. She reports that she does not drink alcohol or use illicit drugs. Home. where does patient live-- Can do ADLs. Can patient participate in ADLs?  Allergies  Allergen Reactions  . Codeine Hives and Nausea And Vomiting  . Sulfonamide Derivatives Swelling    Turns red    Family History  Problem Relation Age of Onset  . Colon cancer Mother   . Coronary artery disease Mother   . Heart disease Mother   . Coronary artery disease Father   . Heart disease Father   . Colon polyps Sister   . Coronary artery disease Sister   . Coronary artery disease Brother   . Diabetes type II Son       Prior to Admission medications   Medication Sig Start Date End Date Taking? Authorizing Provider  amLODipine-valsartan (EXFORGE) 10-320 MG per tablet Take 1 tablet by mouth daily. 02/14/13  Yes Stacie Glaze, MD  aspirin 81 MG tablet Take 81 mg by mouth daily.     Yes Historical Provider, MD  Calcium Carbonate-Vitamin D (CALCIUM 600+D) 600-200 MG-UNIT TABS Take by mouth 2 (two) times daily.  Yes Historical Provider, MD  furosemide (LASIX) 40 MG tablet Take 1 tablet (40 mg total) by mouth daily. 09/01/12  Yes Ripudeep Jenna Luo, MD  isosorbide mononitrate (IMDUR) 60 MG 24 hr tablet Take 1.5 tablets (90 mg total) by mouth daily. Pt request [place] tabs 02/03/13  Yes Stacie Glaze, MD  lansoprazole (PREVACID) 30 MG capsule Take 1 capsule (30 mg total) by mouth 2 (two) times daily. 02/14/13 02/14/14 Yes Stacie Glaze, MD  levothyroxine (SYNTHROID, LEVOTHROID) 50 MCG tablet Take 50 mcg by mouth daily.   Yes Historical Provider, MD  Multiple Vitamin (MULTIVITAMIN) tablet Take 1 tablet  by mouth daily.     Yes Historical Provider, MD  nitroGLYCERIN (NITROSTAT) 0.4 MG SL tablet Place 1 tablet (0.4 mg total) under the tongue every 5 (five) minutes as needed. If pain after 3 pills- call 911 11/15/12  Yes Stacie Glaze, MD  pentosan polysulfate (ELMIRON) 100 MG capsule Take 100 mg by mouth 3 (three) times daily before meals.     Yes Historical Provider, MD  potassium chloride (K-DUR,KLOR-CON) 10 MEQ tablet Take 10 mEq by mouth daily.   Yes Historical Provider, MD  pravastatin (PRAVACHOL) 40 MG tablet Take 40 mg by mouth every morning.   Yes Historical Provider, MD   Physical Exam: Filed Vitals:   04/26/13 1930 04/26/13 1945 04/26/13 2030 04/26/13 2130  BP: 125/48 145/53 144/54 130/46  Pulse: 67 70 76 73  Temp:      TempSrc:      Resp: 18 27 19 18   Height:      Weight:      SpO2: 100% 100% 97% 95%     General:  Well-developed and nourished.  Eyes: Anicteric no pallor.  ENT: No discharge from the ears eyes nose mouth.  Neck: No mass felt.  Cardiovascular: S1-S2 heard.  Respiratory: No rhonchi or crepitations.  Abdomen: Soft nontender bowel sounds present.  Skin: No rash.  Musculoskeletal: Mild edema in both ankles. Mild tenderness in the right ankle.  Psychiatric: Appears normal.  Neurologic: Alert awake oriented to time place and person. Moves all extremities.  Labs on Admission:  Basic Metabolic Panel:  Recent Labs Lab 04/26/13 1810  NA 143  K 3.2*  CL 106  CO2 27  GLUCOSE 109*  BUN 15  CREATININE 1.07  CALCIUM 9.8   Liver Function Tests: No results found for this basename: AST, ALT, ALKPHOS, BILITOT, PROT, ALBUMIN,  in the last 168 hours No results found for this basename: LIPASE, AMYLASE,  in the last 168 hours No results found for this basename: AMMONIA,  in the last 168 hours CBC:  Recent Labs Lab 04/26/13 1810  WBC 8.7  HGB 12.7  HCT 37.2  MCV 86.3  PLT 225   Cardiac Enzymes: No results found for this basename: CKTOTAL, CKMB,  CKMBINDEX, TROPONINI,  in the last 168 hours  BNP (last 3 results)  Recent Labs  08/30/12 0437 04/26/13 1750  PROBNP 346.3 387.0   CBG: No results found for this basename: GLUCAP,  in the last 168 hours  Radiological Exams on Admission: Dg Chest 2 View  04/26/2013   *RADIOLOGY REPORT*  Clinical Data: Chest pain and cough.  Ex-smoker.  CHEST - 2 VIEW  Comparison: To an 22-1013.  Findings: Stable borderline enlarged cardiac silhouette.  The lungs remain mildly hyperexpanded with mild diffuse peribronchial thickening and accentuation of the interstitial markings.  Stable linear scarring in the left lower lung zone.  Diffuse osteopenia. Cholecystectomy clips.  Coronary artery stent.  IMPRESSION:  1.  No acute abnormality. 2.  Stable borderline cardiomegaly and mild changes of COPD and chronic bronchitis.   Original Report Authenticated By: Beckie Salts, M.D.    EKG: Independently reviewed. Normal sinus rhythm. LBBB.  Assessment/Plan Principal Problem:   Chest pain Active Problems:   CORONARY ARTERY DISEASE   Cough   Chronic diastolic CHF (congestive heart failure)   Hypokalemia   1. Chest pain with known history of CAD status post stenting - cycle cardiac markers. Antiplatelet agents. Further recommendations per cardiology. 2. Cough - concerning for bronchitis. Patient has been placed on Levaquin and antitussives. 3. Chronic diastolic failure - continue Lasix. 4. Mild hypokalemia - replace and recheck.  5. Hypothyroidism - continue Synthroid. Check TSH.    Code Status:Full code.  Family Communication: None.  Disposition Plan: Admit.   Seynabou Fults N. [facility] Pager [phone].  If 7PM-7AM, please contact night-coverage [email or web address] Password [facility] 04/26/2013, 9:37 PM

## 2013-04-26 NOTE — ED Notes (Signed)
Report attempt x 1 

## 2013-04-26 NOTE — ED Notes (Signed)
Cardiology MD at bedside.

## 2013-04-26 NOTE — Progress Notes (Signed)
Pt arrived to floor in NAD, VSS, temp 99.3. Pt complaining of no pain. Pt given her PM medicines and oriented to floor, room and hospital. Pt verbalized understanding. Baron Hamper, RN 04/26/2013

## 2013-04-26 NOTE — ED Provider Notes (Signed)
History     CSN: [id number]  Arrival date & time 04/26/13  1737   First MD Initiated Contact with Patient 04/26/13 1830      Chief Complaint  Patient presents with  . Chest Pain    (Consider location/radiation/quality/duration/timing/severity/associated sxs/prior treatment) HPI Comments: Patient presents with a 3 day history of substernal chest pain, heaviness, weakness with diaphoresis and shortness of breath. She reports the pain lasts for hours at a time it radiates underneath her left breast. She also has a dry cough the past 3 days but he is not think this is related. Denies any fever or chills. Reports a history of coronary disease with one stent. She does not know when her last stress test was. She is not had any nausea, vomiting or abdominal pain. Nothing makes the pain better or worse.  The history is provided by the patient.    Past Medical History  Diagnosis Date  . HYPERLIPIDEMIA 03/07/2008  . HYPERTENSION 08/03/2007  . CORONARY ARTERY DISEASE 08/03/2007  . Acute on chronic systolic heart failure 05/23/2009  . HYPOTHYROIDISM 08/03/2007  . Esophageal reflux 10/18/2007  . DIVERTICULOSIS, COLON 08/03/2007  . DISORDER, BLADDER NEC 08/03/2007  . BREAST MASS, BENIGN 05/23/2009  . Edema 02/19/2009  . BARRETT'S ESOPHAGUS, HX OF 08/03/2007  . Interstitial cystitis   . Arthritis   . Internal hemorrhoids   . Epistaxis   . Right hand fracture   . Diarrhea   . Bladder disorder   . Myocardial infarction   . Anginal pain   . Dysrhythmia 08/2012    symptomatic bradycardia  . Shortness of breath     Past Surgical History  Procedure Laterality Date  . Breast surgery      benign mass  . Cholecystectomy    . Cesarean section    . Appendectomy    . Cataract extraction, bilateral    . Tonsillectomy    . Abdominal hysterectomy    . Ankle reconstruction      Left  . Wrist reconstruction      Right    Family History  Problem Relation Age of Onset  . Colon cancer Mother   .  Coronary artery disease Mother   . Heart disease Mother   . Coronary artery disease Father   . Heart disease Father   . Colon polyps Sister   . Coronary artery disease Sister   . Coronary artery disease Brother   . Diabetes type II Son     History  Substance Use Topics  . Smoking status: Former Smoker -- 1.00 packs/day for 15 years    Types: Cigarettes    Quit date: 11/10/1969  . Smokeless tobacco: Never Used  . Alcohol Use: No    OB History   Grav Para Term Preterm Abortions TAB SAB Ect Mult Living                  Review of Systems  Constitutional: Positive for activity change, appetite change and fatigue.  HENT: Negative for congestion and rhinorrhea.   Respiratory: Positive for chest tightness. Negative for cough and shortness of breath.   Cardiovascular: Positive for chest pain.  Gastrointestinal: Negative for nausea, vomiting and abdominal pain.  Genitourinary: Negative for dysuria, hematuria, vaginal bleeding and vaginal discharge.  Musculoskeletal: Negative for back pain.  Skin: Negative for rash.  Neurological: Positive for dizziness, weakness and light-headedness. Negative for headaches.  A complete 10 system review of systems was obtained and all systems are negative except as  noted in the HPI and PMH.    Allergies  Codeine and Sulfonamide derivatives  Home Medications   No current outpatient prescriptions on file.  BP 139/61  Pulse 80  Temp(Src) 99.3 F (37.4 C) (Oral)  Resp 18  Ht 5\' 3"  (1.6 m)  Wt 147 lb 11.2 oz (66.996 kg)  BMI 26.17 kg/m2  SpO2 98%  Physical Exam  Constitutional: She is oriented to person, place, and time. She appears well-developed and well-nourished. No distress.  HENT:  Head: Normocephalic and atraumatic.  Mouth/Throat: Oropharynx is clear and moist. No oropharyngeal exudate.  Eyes: Conjunctivae and EOM are normal. Pupils are equal, round, and reactive to light.  Neck: Normal range of motion. Neck supple.   Cardiovascular: Regular rhythm and normal heart sounds.   No murmur heard. Pulmonary/Chest: Effort normal and breath sounds normal. No respiratory distress. She exhibits no tenderness.  Chest wall nontender  Abdominal: Soft. There is no tenderness. There is no rebound and no guarding.  Musculoskeletal: Normal range of motion. She exhibits no edema and no tenderness.  Neurological: She is alert and oriented to person, place, and time. No cranial nerve deficit. She exhibits normal muscle tone. Coordination normal.  Skin: Skin is warm.    ED Course  Procedures (including critical care time)  Labs Reviewed  BASIC METABOLIC PANEL - Abnormal; Notable for the following:    Potassium 3.2 (*)    Glucose, Bld 109 (*)    GFR calc non Af Amer 46 (*)    GFR calc Af Amer 53 (*)    All other components within normal limits  CBC - Abnormal; Notable for the following:    HCT 35.8 (*)    All other components within normal limits  CBC  PRO B NATRIURETIC PEPTIDE  TROPONIN I  TROPONIN I  TROPONIN I  COMPREHENSIVE METABOLIC PANEL  CBC WITH DIFFERENTIAL  CREATININE, SERUM  POCT I-STAT TROPONIN I   Dg Chest 2 View  04/26/2013   *RADIOLOGY REPORT*  Clinical Data: Chest pain and cough.  Ex-smoker.  CHEST - 2 VIEW  Comparison: To an 22-1013.  Findings: Stable borderline enlarged cardiac silhouette.  The lungs remain mildly hyperexpanded with mild diffuse peribronchial thickening and accentuation of the interstitial markings.  Stable linear scarring in the left lower lung zone.  Diffuse osteopenia. Cholecystectomy clips.  Coronary artery stent.  IMPRESSION:  1.  No acute abnormality. 2.  Stable borderline cardiomegaly and mild changes of COPD and chronic bronchitis.   Original Report Authenticated By: Beckie Salts, M.D.   Dg Ankle 2 Views Right  04/26/2013   *RADIOLOGY REPORT*  Clinical Data: Right ankle pain.  RIGHT ANKLE - 2 VIEW  Comparison: No priors.  Findings: AP and lateral views of the right ankle  demonstrate some mild soft tissue swelling around the ankle joint.  No acute displaced fracture, subluxation or dislocation.  No retained radiopaque foreign body within the soft tissues of the ankle.  IMPRESSION: Negative for acute bony abnormality or retained radiopaque foreign body.   Original Report Authenticated By: Trudie Reed, M.D.     1. CAD (coronary artery disease)   2. Chest pain   3. Cough   4. Diastolic HF (heart failure), chronic       MDM  2 days of waxing and waning chest pain associated with dry cough, weakness, shortness of breath and dizziness.  Suspect the patient's chest pain is due to her cough. However she does describe a heaviness in her chest with nausea  and shortness of breath when she is not coughing. This is more concerning for cardiac chest pain.  Discussed with cardiology Dr. Myrtis Ser. Degrees he is further evaluation for her chest pain but prefers medical admission with a concomitant shortness of breath and cough.  Chest x-ray shows no pneumonia. She is started on empiric levaquin for probable bronchitis. Will admit for cycling of enzymes and further treatments.   Date: 04/26/2013  Rate: 88  Rhythm: normal sinus rhythm  QRS Axis: normal  Intervals: normal  ST/T Wave abnormalities: normal  Conduction Disutrbances:LBBB  Narrative Interpretation:   Old EKG Reviewed: unchanged    Glynn Octave, MD 04/26/13 2330

## 2013-04-26 NOTE — Consult Note (Signed)
CARDIOLOGY CONSULT NOTE  Patient ID: Victoria Gomez MRN: [id number] DOB/AGE: 1925/03/25 79 y.o.  Admit date: 04/26/2013  Primary PhysicianJENKINS,JOHN EDWARD, MD Primary Cardiologist  [place]   HPI:  The patient in the emergency room today with a cough that has persisted for 3 days. She feels poorly in general. She also does have some chest discomfort. Her first troponin is normal. She has left bundle branch block as her baseline EKG. Her cardiac status is followed by Dr. Antoine Poche. He saw her last in March, 2014. Earlier in the year she had a syncopal episode with no definitive diagnosis. There is a history of chronic diastolic heart failure that has been stable.    Past Medical History  Diagnosis Date  . HYPERLIPIDEMIA 03/07/2008  . HYPERTENSION 08/03/2007  . CORONARY ARTERY DISEASE 08/03/2007  . Acute on chronic systolic heart failure 05/23/2009  . HYPOTHYROIDISM 08/03/2007  . Esophageal reflux 10/18/2007  . DIVERTICULOSIS, COLON 08/03/2007  . DISORDER, BLADDER NEC 08/03/2007  . BREAST MASS, BENIGN 05/23/2009  . Edema 02/19/2009  . BARRETT'S ESOPHAGUS, HX OF 08/03/2007  . Interstitial cystitis   . Arthritis   . Internal hemorrhoids   . Epistaxis   . Right hand fracture   . Diarrhea   . Bladder disorder   . Myocardial infarction   . Anginal pain   . Dysrhythmia 08/2012    symptomatic bradycardia  . Shortness of breath     Family History  Problem Relation Age of Onset  . Colon cancer Mother   . Coronary artery disease Mother   . Heart disease Mother   . Coronary artery disease Father   . Heart disease Father   . Colon polyps Sister   . Coronary artery disease Sister   . Coronary artery disease Brother   . Diabetes type II Son     History   Social History  . Marital Status: Married    Spouse Name: N/A    Number of Children: N/A  . Years of Education: N/A   Occupational History  . retired    Social History Main Topics  . Smoking status: Former Smoker -- 1.00  packs/day for 15 years    Types: Cigarettes    Quit date: 11/10/1969  . Smokeless tobacco: Never Used  . Alcohol Use: No  . Drug Use: No  . Sexually Active: No   Other Topics Concern  . Not on file   Social History Narrative  . No narrative on file    Past Surgical History  Procedure Laterality Date  . Breast surgery      benign mass  . Cholecystectomy    . Cesarean section    . Appendectomy    . Cataract extraction, bilateral    . Tonsillectomy    . Abdominal hysterectomy    . Ankle reconstruction      Left  . Wrist reconstruction      Right   Review of systems:   The patient does say that she's felt some fever over the past several days. She's not febrile in the year. She denies headaches, rash, change in vision, change in hearing, nausea vomiting, urinary symptoms. All other systems are reviewed and are negative other than the history of present illness.     Physical Exam: Blood pressure 125/48, pulse 67, temperature 99.8 F (37.7 C), temperature source Oral, resp. rate 18, height 5\' 3"  (1.6 m), weight 148 lb (67.132 kg), SpO2 100.00%.   Patient is here with a family member. She  looks fatigued. She is oriented to person time and place. Affect is normal. She is asking if she needs to stay in the hospital. She says she has some discomfort in her anterior chest. There is some pain to palpation. She has an ongoing regular cough. This is nonproductive. There is no jugulovenous distention. Lungs reveal scattered rhonchi. No rales are heard. Cardiac exam reveals S1 and S2. The abdomen is soft. There is no significant peripheral edema. There no musculoskeletal deformities. There are no skin rashes.  Labs:   Lab Results  Component Value Date   WBC 8.7 04/26/2013   HGB 12.7 04/26/2013   HCT 37.2 04/26/2013   MCV 86.3 04/26/2013   PLT 225 04/26/2013    Recent Labs Lab 04/26/13 1810  NA 143  K 3.2*  CL 106  CO2 27  BUN 15  CREATININE 1.07  CALCIUM 9.8  GLUCOSE 109*    Lab Results  Component Value Date   TROPONINI <0.30 08/30/2012    Lab Results  Component Value Date   CHOL 153 02/14/2013   CHOL 137 03/01/2012   CHOL 133 09/04/2010   Lab Results  Component Value Date   HDL 47.20 02/14/2013   HDL 49.50 03/01/2012   HDL 29.56 09/04/2010   Lab Results  Component Value Date   LDLCALC 80 02/14/2013   LDLCALC 63 03/01/2012   LDLCALC 73 09/04/2010   Lab Results  Component Value Date   TRIG 130.0 02/14/2013   TRIG 121.0 03/01/2012   TRIG 98.0 09/04/2010   Lab Results  Component Value Date   CHOLHDL 3 02/14/2013   CHOLHDL 3 03/01/2012   CHOLHDL 3 09/04/2010   Lab Results  Component Value Date   LDLDIRECT 79.7 12/11/2010   LDLDIRECT 81.0 02/19/2010   LDLDIRECT 60.8 12/21/2008    BNP (last 3 results)  Recent Labs  08/30/12 0437 04/26/13 1750  PROBNP 346.3 387.0      Radiology: Dg Chest 2 View  04/26/2013   *RADIOLOGY REPORT*  Clinical Data: Chest pain and cough.  Ex-smoker.  CHEST - 2 VIEW  Comparison: To an 22-1013.  Findings: Stable borderline enlarged cardiac silhouette.  The lungs remain mildly hyperexpanded with mild diffuse peribronchial thickening and accentuation of the interstitial markings.  Stable linear scarring in the left lower lung zone.  Diffuse osteopenia. Cholecystectomy clips.  Coronary artery stent.  IMPRESSION:  1.  No acute abnormality. 2.  Stable borderline cardiomegaly and mild changes of COPD and chronic bronchitis.   Original Report Authenticated By: Beckie Salts, M.D.   EKG:  EKG reveals interventricular conduction delay. I am wondering if there is a possibility of lead reversal.  ASSESSMENT AND PLAN:  Active Problems:    CORONARY ARTERY DISEASE        The patient has known coronary artery disease. I'm not convinced that her current symptoms are and acute coronary syndrome. She is to remain on her current meds. Further troponins will be checked.    Cough     This is probably the major issue. Etiology of this dry cough  is not clear. This needs to be treated and I suspect she'll feel better.    Chest pain     We are consulted because of her history of coronary disease in the chest pain. I doubt that her current chest discomfort is cardiac in origin. The plan for now will be to watch enzymes. Her EKGs do not help [place].    Chronic diastolic CHF (congestive heart failure)  There is a history of chronic diastolic heart failure. Her cough does not appear to be consistent with CHF.    Hypokalemia    Potassium was 3.2. She will need to be treated.  We will follow her cardiac status she is in the hospital. No plan for aggressive intervention at this point.  Jerral Bonito, MD

## 2013-04-27 DIAGNOSIS — I2510 Atherosclerotic heart disease of native coronary artery without angina pectoris: Secondary | ICD-10-CM | POA: Diagnosis not present

## 2013-04-27 DIAGNOSIS — R079 Chest pain, unspecified: Secondary | ICD-10-CM | POA: Diagnosis not present

## 2013-04-27 LAB — CBC WITH DIFFERENTIAL/PLATELET
Basophils Absolute: 0 10*3/uL (ref 0.0–0.1)
HCT: 35.1 % — ABNORMAL LOW (ref 36.0–46.0)
Hemoglobin: 11.7 g/dL — ABNORMAL LOW (ref 12.0–15.0)
Lymphocytes Relative: 24 % (ref 12–46)
Lymphs Abs: 1.5 10*3/uL (ref 0.7–4.0)
Monocytes Absolute: 0.6 10*3/uL (ref 0.1–1.0)
Monocytes Relative: 10 % (ref 3–12)
Neutro Abs: 3.9 10*3/uL (ref 1.7–7.7)
RBC: 4.05 MIL/uL (ref 3.87–5.11)
WBC: 6.3 10*3/uL (ref 4.0–10.5)

## 2013-04-27 LAB — COMPREHENSIVE METABOLIC PANEL
BUN: 14 mg/dL (ref 6–23)
Calcium: 9 mg/dL (ref 8.4–10.5)
GFR calc Af Amer: 51 mL/min — ABNORMAL LOW (ref >90)
Glucose, Bld: 111 mg/dL — ABNORMAL HIGH (ref 70–99)
Sodium: 142 mEq/L (ref 135–145)
Total Protein: 6.4 g/dL (ref 6.0–8.3)

## 2013-04-27 LAB — TROPONIN I: Troponin I: <0.3 ng/mL (ref <0.30)

## 2013-04-27 MED ORDER — ALBUTEROL SULFATE HFA 108 (90 BASE) MCG/ACT IN AERS: 2.0000 | INHALATION_SPRAY | Freq: Four times a day (QID) | RESPIRATORY_TRACT | Status: DC | PRN

## 2013-04-27 MED ORDER — GUAIFENESIN-DM 100-10 MG/5ML PO SYRP: 5.0000 mL | ORAL_SOLUTION | Freq: Three times a day (TID) | ORAL | Status: DC | PRN

## 2013-04-27 MED ORDER — LEVOFLOXACIN 750 MG PO TABS
750.0000 mg | ORAL_TABLET | ORAL | Status: DC
  Filled 2013-04-27: qty 1

## 2013-04-27 MED ORDER — LEVOFLOXACIN 750 MG PO TABS: 750.0000 mg | ORAL_TABLET | ORAL | Status: DC

## 2013-04-27 NOTE — Discharge Summary (Signed)
[facility]                                                                                   Victoria Gomez, is a 79 y.o. female  DOB 01-30-1925  MRN [id number].  Admission date:  04/26/2013  Discharge Date:  04/27/2013  Primary MD  Carrie Mew, MD  Admitting Physician  Eduard Clos, MD  Admission Diagnosis  Cough [786.2] CAD (coronary artery disease) [414.00] Chest pain [786.50] Diastolic HF (heart failure), chronic [428.32]  Discharge Diagnosis     Principal Problem:   Chest pain Active Problems:   CORONARY ARTERY DISEASE   Cough   Chronic diastolic CHF (congestive heart failure)   Hypokalemia    Past Medical History  Diagnosis Date  . HYPERLIPIDEMIA 03/07/2008  . HYPERTENSION 08/03/2007  . CORONARY ARTERY DISEASE 08/03/2007  . Acute on chronic systolic heart failure 05/23/2009  . HYPOTHYROIDISM 08/03/2007  . Esophageal reflux 10/18/2007  . DIVERTICULOSIS, COLON 08/03/2007  . DISORDER, BLADDER NEC 08/03/2007  . BREAST MASS, BENIGN 05/23/2009  . Edema 02/19/2009  . BARRETT'S ESOPHAGUS, HX OF 08/03/2007  . Interstitial cystitis   . Arthritis   . Internal hemorrhoids   . Epistaxis   . Right hand fracture   . Diarrhea   . Bladder disorder   . Myocardial infarction   . Anginal pain   . Dysrhythmia 08/2012    symptomatic bradycardia  . Shortness of breath     Past Surgical History  Procedure Laterality Date  . Breast surgery      benign mass  . Cholecystectomy    . Cesarean section    . Appendectomy    . Cataract extraction, bilateral    . Tonsillectomy    . Abdominal hysterectomy    . Ankle reconstruction      Left  . Wrist reconstruction      Right     Recommendations for primary care physician for things to follow:       Discharge Diagnoses:   Principal Problem:   Chest pain Active Problems:   CORONARY ARTERY DISEASE   Cough   Chronic diastolic CHF (congestive heart failure)   Hypokalemia    Discharge Condition:  stable   Diet recommendation: See Discharge Instructions below   Consults     History of present illness and  Hospital Course:     Kindly see H&P for history of present illness and admission details, please review complete Labs, Consult reports and Test reports for all details in brief Victoria Gomez, is a 79 y.o. female, patient with history of CAD status post stenting, chronic diastolic dysfunction presented the hospital with 3 day history of intense cough with some muscular skeletal chest pain, EKG was showing chronic left bundle branch block changes, or troponins were negative, she was seen by cardiology in the ER, she was treated with complete Levaquin with good effect, she now feels a whole lot better, she is no shortness of breath and is stable on room air, is tolerating activity well. She is completely chest pain-free and eager to go home.   She will be discharged home on Levaquin, will request primary care physician  to kindly repeated 2 view chest x-ray along with CBC and BMP in one week. She will also follow with her cardiologist post discharge.    She is history of chronic diastolic dysfunction last echogram in 2012 shows EF of 60% she will continue her home medications unchanged she is completely compensated from the standpoint.   She is history of hypothyroidism continue home dose Synthroid outpatient monitoring of TSH by primary care physician.     Today   Subjective:   [name] today has no headache,no chest abdominal pain,no new weakness tingling or numbness, feels much better wants to go home today.    Objective:   Blood pressure 109/50, pulse 65, temperature 99 F (37.2 C), temperature source Oral, resp. rate 19, height 5\' 3"  (1.6 m), weight 66.679 kg (147 lb), SpO2 98.00%.   Intake/Output Summary (Last 24 hours) at 04/27/13 1136 Last data filed at 04/27/13 1041  Gross per 24 hour  Intake    480 ml  Output   1100 ml  Net   -620 ml    Exam Awake  Alert, Oriented *3, No new F.N deficits, Normal affect [place].AT,PERRAL Supple Neck,No JVD, No cervical lymphadenopathy appriciated.  Symmetrical Chest wall movement, Good air movement bilaterally, CTAB RRR,No Gallops,Rubs or new Murmurs, No Parasternal Heave +ve B.Sounds, Abd Soft, Non tender, No organomegaly appriciated, No rebound -guarding or rigidity. No Cyanosis, Clubbing or edema, No new Rash or bruise  Data Review   Major procedures and Radiology Reports - PLEASE review detailed and final reports for all details in brief -       Dg Chest 2 View  04/26/2013   *RADIOLOGY REPORT*  Clinical Data: Chest pain and cough.  Ex-smoker.  CHEST - 2 VIEW  Comparison: To an 22-1013.  Findings: Stable borderline enlarged cardiac silhouette.  The lungs remain mildly hyperexpanded with mild diffuse peribronchial thickening and accentuation of the interstitial markings.  Stable linear scarring in the left lower lung zone.  Diffuse osteopenia. Cholecystectomy clips.  Coronary artery stent.  IMPRESSION:  1.  No acute abnormality. 2.  Stable borderline cardiomegaly and mild changes of COPD and chronic bronchitis.   Original Report Authenticated By: Beckie Salts, M.D.   Dg Ankle 2 Views Right  04/26/2013   *RADIOLOGY REPORT*  Clinical Data: Right ankle pain.  RIGHT ANKLE - 2 VIEW  Comparison: No priors.  Findings: AP and lateral views of the right ankle demonstrate some mild soft tissue swelling around the ankle joint.  No acute displaced fracture, subluxation or dislocation.  No retained radiopaque foreign body within the soft tissues of the ankle.  IMPRESSION: Negative for acute bony abnormality or retained radiopaque foreign body.   Original Report Authenticated By: Trudie Reed, M.D.    Micro Results     CBC w Diff: Lab Results  Component Value Date   WBC 6.3 04/27/2013   HGB 11.7* 04/27/2013   HCT 35.1* 04/27/2013   PLT 198 04/27/2013   LYMPHOPCT 24 04/27/2013   MONOPCT 10 04/27/2013   EOSPCT 5  04/27/2013   BASOPCT 0 04/27/2013    CMP: Lab Results  Component Value Date   NA 142 04/27/2013   K 3.8 04/27/2013   CL 106 04/27/2013   CO2 28 04/27/2013   BUN 14 04/27/2013   CREATININE 1.11* 04/27/2013   PROT 6.4 04/27/2013   ALBUMIN 3.1* 04/27/2013   BILITOT 0.4 04/27/2013   ALKPHOS 79 04/27/2013   AST 16 04/27/2013   ALT 11 04/27/2013  .  Discharge Instructions     Follow with Primary MD Carrie Mew, MD in 7 days   Get CBC, CMP, checked 7 days by Primary MD and again as instructed by your Primary MD. Get a 2 view Chest X ray done next visit.  Get Medicines reviewed and adjusted.  Please request your Prim.MD to go over all Hospital Tests and Procedure/Radiological results at the follow up, please get all Hospital records sent to your Prim MD by signing hospital release before you go home.  Activity: As tolerated with Full fall precautions use walker/cane & assistance as needed   Diet:  Heart healthy  For Heart failure patients - Check your Weight same time everyday, if you gain over 2 pounds, or you develop in leg swelling, experience more shortness of breath or chest pain, call your Primary MD immediately. Follow Cardiac Low Salt Diet and 1.8 lit/day fluid restriction.  Disposition Home    If you experience worsening of your admission symptoms, develop shortness of breath, life threatening emergency, suicidal or homicidal thoughts you must seek medical attention immediately by calling 911 or calling your MD immediately  if symptoms less severe.  You Must read complete instructions/literature along with all the possible adverse reactions/side effects for all the Medicines you take and that have been prescribed to you. Take any new Medicines after you have completely understood and accpet all the possible adverse reactions/side effects.   Do not drive and provide baby sitting services if your were admitted for syncope or siezures until you have seen by Primary MD or a  Neurologist and advised to do so again.  Do not drive when taking Pain medications.    Do not take more than prescribed Pain, Sleep and Anxiety Medications  Special Instructions: If you have smoked or chewed Tobacco  in the last 2 yrs please stop smoking, stop any regular Alcohol  and or any Recreational drug use.  Wear Seat belts while driving.   Please note  You were cared for by a hospitalist during your hospital stay. If you have any questions about your discharge medications or the care you received while you were in the hospital after you are discharged, you can call the unit and asked to speak with the hospitalist on call if the hospitalist that took care of you is not available. Once you are discharged, your primary care physician will handle any further medical issues. Please note that NO REFILLS for any discharge medications will be authorized once you are discharged, as it is imperative that you return to your primary care physician (or establish a relationship with a primary care physician if you do not have one) for your aftercare needs so that they can reassess your need for medications and monitor your lab values.   Follow-up Information   Follow up with Rollene Rotunda, MD In 1 week.   Contact information:   [address] [place] [address] [phone]         Discharge Medications     Medication List    TAKE these medications       albuterol 108 (90 BASE) MCG/ACT inhaler  Commonly known as:  PROVENTIL HFA;VENTOLIN HFA  Inhale 2 puffs into the lungs every 6 (six) hours as needed for wheezing or shortness of breath.     amLODipine-valsartan 10-320 MG per tablet  Commonly known as:  EXFORGE  Take 1 tablet by mouth daily.     aspirin 81 MG tablet  Take 81 mg by mouth daily.     CALCIUM 600+D 600-200 MG-UNIT Tabs  Generic drug:  Calcium Carbonate-Vitamin D  Take by mouth 2 (two) times daily.     furosemide 40 MG  tablet  Commonly known as:  LASIX  Take 1 tablet (40 mg total) by mouth daily.     guaiFENesin-dextromethorphan 100-10 MG/5ML syrup  Commonly known as:  ROBITUSSIN DM  Take 5 mLs by mouth 3 (three) times daily as needed for cough.     isosorbide mononitrate 60 MG 24 hr tablet  Commonly known as:  IMDUR  Take 1.5 tablets (90 mg total) by mouth daily. Pt request west-wards tabs     lansoprazole 30 MG capsule  Commonly known as:  PREVACID  Take 1 capsule (30 mg total) by mouth 2 (two) times daily.     levofloxacin 750 MG tablet  Commonly known as:  LEVAQUIN  Take 1 tablet (750 mg total) by mouth every other day.     levothyroxine 50 MCG tablet  Commonly known as:  SYNTHROID, LEVOTHROID  Take 50 mcg by mouth daily.     multivitamin tablet  Take 1 tablet by mouth daily.     nitroGLYCERIN 0.4 MG SL tablet  Commonly known as:  NITROSTAT  Place 1 tablet (0.4 mg total) under the tongue every 5 (five) minutes as needed. If pain after 3 pills- call 911     pentosan polysulfate 100 MG capsule  Commonly known as:  ELMIRON  Take 100 mg by mouth 3 (three) times daily before meals.     potassium chloride 10 MEQ tablet  Commonly known as:  K-DUR,KLOR-CON  Take 10 mEq by mouth daily.     pravastatin 40 MG tablet  Commonly known as:  PRAVACHOL  Take 40 mg by mouth every morning.           Total Time in preparing paper work, data evaluation and todays exam - 35 minutes  Leroy Sea M.D on 04/27/2013 at 11:36 AM  [facility] Office  [phone]

## 2013-04-27 NOTE — Progress Notes (Signed)
SUBJECTIVE:  No further chest pain but she does have a dry nonproductive cough.   PHYSICAL EXAM Filed Vitals:   04/26/13 2030 04/26/13 2130 04/26/13 2200 04/27/13 0439  BP: 144/54 130/46 139/61 109/50  Pulse: 76 73 80 65  Temp:   99.3 F (37.4 C) 99 F (37.2 C)  TempSrc:   Oral Oral  Resp: 19 18 18 19   Height:   5\' 3"  (1.6 m)   Weight:   147 lb 11.2 oz (66.996 kg) 147 lb (66.679 kg)  SpO2: 97% 95% 98% 98%   General:  No distress Lungs:  Clear Heart:  RRR Abdomen:  Positive bowel sounds, no rebound no guarding Extremities:  No edema  LABS: Lab Results  Component Value Date   TROPONINI <0.30 04/27/2013   Results for orders placed during the hospital encounter of 04/26/13 (from the past 24 hour(s))  PRO B NATRIURETIC PEPTIDE     Status: None   Collection Time    04/26/13  5:50 PM      Result Value Range   Pro B Natriuretic peptide (BNP) 387.0  0 - 450 pg/mL  CBC     Status: None   Collection Time    04/26/13  6:10 PM      Result Value Range   WBC 8.7  4.0 - 10.5 K/uL   RBC 4.31  3.87 - 5.11 MIL/uL   Hemoglobin 12.7  12.0 - 15.0 g/dL   HCT 16.1  09.6 - 04.5 %   MCV 86.3  78.0 - 100.0 fL   MCH 29.5  26.0 - 34.0 pg   MCHC 34.1  30.0 - 36.0 g/dL   RDW 40.9  81.1 - 91.4 %   Platelets 225  150 - 400 K/uL  BASIC METABOLIC PANEL     Status: Abnormal   Collection Time    04/26/13  6:10 PM      Result Value Range   Sodium 143  135 - 145 mEq/L   Potassium 3.2 (*) 3.5 - 5.1 mEq/L   Chloride 106  96 - 112 mEq/L   CO2 27  19 - 32 mEq/L   Glucose, Bld 109 (*) 70 - 99 mg/dL   BUN 15  6 - 23 mg/dL   Creatinine, Ser 7.82  0.50 - 1.10 mg/dL   Calcium 9.8  8.4 - 95.6 mg/dL   GFR calc non Af Amer 46 (*) >90 mL/min   GFR calc Af Amer 53 (*) >90 mL/min  POCT I-STAT TROPONIN I     Status: None   Collection Time    04/26/13  6:14 PM      Result Value Range   Troponin i, poc 0.02  0.00 - 0.08 ng/mL   Comment 3           TROPONIN I     Status: None   Collection Time   04/26/13 10:49 PM      Result Value Range   Troponin I <0.30  <0.30 ng/mL  CBC     Status: Abnormal   Collection Time    04/26/13 10:50 PM      Result Value Range   WBC 7.5  4.0 - 10.5 K/uL   RBC 4.15  3.87 - 5.11 MIL/uL   Hemoglobin 12.1  12.0 - 15.0 g/dL   HCT 21.3 (*) 08.6 - 57.8 %   MCV 86.3  78.0 - 100.0 fL   MCH 29.2  26.0 - 34.0 pg   MCHC 33.8  30.0 -  36.0 g/dL   RDW 16.1  09.6 - 04.5 %   Platelets 197  150 - 400 K/uL  CREATININE, SERUM     Status: Abnormal   Collection Time    04/26/13 10:50 PM      Result Value Range   Creatinine, Ser 0.97  0.50 - 1.10 mg/dL   GFR calc non Af Amer 51 (*) >90 mL/min   GFR calc Af Amer 60 (*) >90 mL/min  TROPONIN I     Status: None   Collection Time    04/27/13  5:37 AM      Result Value Range   Troponin I <0.30  <0.30 ng/mL  COMPREHENSIVE METABOLIC PANEL     Status: Abnormal   Collection Time    04/27/13  5:37 AM      Result Value Range   Sodium 142  135 - 145 mEq/L   Potassium 3.8  3.5 - 5.1 mEq/L   Chloride 106  96 - 112 mEq/L   CO2 28  19 - 32 mEq/L   Glucose, Bld 111 (*) 70 - 99 mg/dL   BUN 14  6 - 23 mg/dL   Creatinine, Ser 4.09 (*) 0.50 - 1.10 mg/dL   Calcium 9.0  8.4 - 81.1 mg/dL   Total Protein 6.4  6.0 - 8.3 g/dL   Albumin 3.1 (*) 3.5 - 5.2 g/dL   AST 16  0 - 37 U/L   ALT 11  0 - 35 U/L   Alkaline Phosphatase 79  39 - 117 U/L   Total Bilirubin 0.4  0.3 - 1.2 mg/dL   GFR calc non Af Amer 44 (*) >90 mL/min   GFR calc Af Amer 51 (*) >90 mL/min  CBC WITH DIFFERENTIAL     Status: Abnormal   Collection Time    04/27/13  5:37 AM      Result Value Range   WBC 6.3  4.0 - 10.5 K/uL   RBC 4.05  3.87 - 5.11 MIL/uL   Hemoglobin 11.7 (*) 12.0 - 15.0 g/dL   HCT 91.4 (*) 78.2 - 95.6 %   MCV 86.7  78.0 - 100.0 fL   MCH 28.9  26.0 - 34.0 pg   MCHC 33.3  30.0 - 36.0 g/dL   RDW 21.3  08.6 - 57.8 %   Platelets 198  150 - 400 K/uL   Neutrophils Relative % 61  43 - 77 %   Neutro Abs 3.9  1.7 - 7.7 K/uL   Lymphocytes Relative  24  12 - 46 %   Lymphs Abs 1.5  0.7 - 4.0 K/uL   Monocytes Relative 10  3 - 12 %   Monocytes Absolute 0.6  0.1 - 1.0 K/uL   Eosinophils Relative 5  0 - 5 %   Eosinophils Absolute 0.3  0.0 - 0.7 K/uL   Basophils Relative 0  0 - 1 %   Basophils Absolute 0.0  0.0 - 0.1 K/uL    Intake/Output Summary (Last 24 hours) at 04/27/13 0840 Last data filed at 04/27/13 0443  Gross per 24 hour  Intake    240 ml  Output    750 ml  Net   -510 ml     ASSESSMENT AND PLAN:  Chest pain:  Cardiac enzymes negative.  She has had fever at home with cough (nonproductive).  I reviewed the 2012 cath.  At this point there is no indication that her chest pain is cardiac.  I would not suggest further cardiac  work up.  Please call with further questions.    Fayrene Fearing [facility] 04/27/2013 8:40 AM

## 2013-04-28 NOTE — Progress Notes (Signed)
UR chart review completed.  

## 2013-04-29 ENCOUNTER — Telehealth: Payer: Self-pay | Admitting: *Deleted

## 2013-04-29 NOTE — Telephone Encounter (Signed)
Transitional care  Admit date:04/26/2013 Discharge date:04/27/2013  Admission diagnoses: CAD Chest pain Diastolic hf  Discharge diagnosis Principal problem:  chest pain Active problems:  coronary artery disease  cough Chronic diastolic CHF  hypokalemia  Discharge condition was stable  Talked with patient and she states she went to the hospital because she was so weak and had chronic coughing.  She states she had pneumonia and was discharged with levaquin. She only stayed in the hosptial for 1 day and they did not want to discharge her ,but she was discharged the day after admission because she had to be home with husband who is on dialysis.  She states she feels some better, but very weak and continues to cough.  She is compliant with medication . Encouraged to let son attend to husband so she can improve.  She states she would.  Has ov.Victoria Gomez . NP> on 05/04/2013. Pt is aware of appointment

## 2013-04-29 NOTE — Telephone Encounter (Signed)
error 

## 2013-05-02 ENCOUNTER — Other Ambulatory Visit: Payer: Self-pay

## 2013-05-02 DIAGNOSIS — Z1231 Encounter for screening mammogram for malignant neoplasm of breast: Secondary | ICD-10-CM

## 2013-05-04 ENCOUNTER — Encounter: Payer: Self-pay | Admitting: Family

## 2013-05-04 ENCOUNTER — Ambulatory Visit (INDEPENDENT_AMBULATORY_CARE_PROVIDER_SITE_OTHER): Payer: Medicare Other | Admitting: Family

## 2013-05-04 VITALS — BP 126/76 | HR 85 | Wt 146.0 lb

## 2013-05-04 DIAGNOSIS — J449 Chronic obstructive pulmonary disease, unspecified: Secondary | ICD-10-CM

## 2013-05-04 DIAGNOSIS — J4489 Other specified chronic obstructive pulmonary disease: Secondary | ICD-10-CM

## 2013-05-04 DIAGNOSIS — R05 Cough: Secondary | ICD-10-CM

## 2013-05-04 DIAGNOSIS — J209 Acute bronchitis, unspecified: Secondary | ICD-10-CM | POA: Diagnosis not present

## 2013-05-04 DIAGNOSIS — R059 Cough, unspecified: Secondary | ICD-10-CM

## 2013-05-04 MED ORDER — METHYLPREDNISOLONE 4 MG PO KIT: PACK | ORAL | Status: AC

## 2013-05-04 MED ORDER — DOXYCYCLINE HYCLATE 100 MG PO TABS: 100.0000 mg | ORAL_TABLET | Freq: Two times a day (BID) | ORAL | Status: DC

## 2013-05-04 NOTE — Patient Instructions (Addendum)

## 2013-05-05 NOTE — Progress Notes (Signed)
Subjective:    Patient ID: Victoria Gomez, female    DOB: 1925/10/08, 79 y.o.   MRN: [id number]  HPI 79 year old white female, nonsmoker, patient of Dr. Dalby is in today as a hospital follow-up. She was seen in the ED on 04-26-2013 with c/o chest pain, SOB, nausea, and vomiting. Was diagnosed with COPD exacerbation/Chronic Bronchitis. She reports receiving IV Levaquin while in the hospital. She was discharged on Levaquin but has not been tolerating it well. Reports nausea after taking a dosage. Continues to have a cough with white phlegm.    Review of Systems  Constitutional: Negative.   HENT: Negative.   Respiratory: Positive for cough and wheezing.   Cardiovascular: Negative.  Negative for chest pain and leg swelling.  Gastrointestinal: Negative.   Musculoskeletal: Negative.   Skin: Negative.   Allergic/Immunologic: Negative.   Neurological: Negative.   Psychiatric/Behavioral: Negative.    Past Medical History  Diagnosis Date  . HYPERLIPIDEMIA 03/07/2008  . HYPERTENSION 08/03/2007  . CORONARY ARTERY DISEASE 08/03/2007  . Acute on chronic systolic heart failure 05/23/2009  . HYPOTHYROIDISM 08/03/2007  . Esophageal reflux 10/18/2007  . DIVERTICULOSIS, COLON 08/03/2007  . DISORDER, BLADDER NEC 08/03/2007  . BREAST MASS, BENIGN 05/23/2009  . Edema 02/19/2009  . BARRETT'S ESOPHAGUS, HX OF 08/03/2007  . Interstitial cystitis   . Arthritis   . Internal hemorrhoids   . Epistaxis   . Right hand fracture   . Diarrhea   . Bladder disorder   . Myocardial infarction   . Anginal pain   . Dysrhythmia 08/2012    symptomatic bradycardia  . Shortness of breath     History   Social History  . Marital Status: Married    Spouse Name: N/A    Number of Children: N/A  . Years of Education: N/A   Occupational History  . retired    Social History Main Topics  . Smoking status: Former Smoker -- 1.00 packs/day for 15 years    Types: Cigarettes    Quit date: 11/10/1969  . Smokeless  tobacco: Never Used  . Alcohol Use: No  . Drug Use: No  . Sexually Active: No   Other Topics Concern  . Not on file   Social History Narrative  . No narrative on file    Past Surgical History  Procedure Laterality Date  . Breast surgery      benign mass  . Cholecystectomy    . Cesarean section    . Appendectomy    . Cataract extraction, bilateral    . Tonsillectomy    . Abdominal hysterectomy    . Ankle reconstruction      Left  . Wrist reconstruction      Right    Family History  Problem Relation Age of Onset  . Colon cancer Mother   . Coronary artery disease Mother   . Heart disease Mother   . Coronary artery disease Father   . Heart disease Father   . Colon polyps Sister   . Coronary artery disease Sister   . Coronary artery disease Brother   . Diabetes type II Son     Allergies  Allergen Reactions  . Codeine Hives and Nausea And Vomiting  . Sulfonamide Derivatives Swelling    Turns red    Current Outpatient Prescriptions on File Prior to Visit  Medication Sig Dispense Refill  . albuterol (PROVENTIL HFA;VENTOLIN HFA) 108 (90 BASE) MCG/ACT inhaler Inhale 2 puffs into the lungs every 6 (six) hours as  needed for wheezing or shortness of breath.  1 Inhaler  2  . amLODipine-valsartan (EXFORGE) 10-320 MG per tablet Take 1 tablet by mouth daily.  90 tablet  3  . aspirin 81 MG tablet Take 81 mg by mouth daily.        . Calcium Carbonate-Vitamin D (CALCIUM 600+D) 600-200 MG-UNIT TABS Take by mouth 2 (two) times daily.        . furosemide (LASIX) 40 MG tablet Take 1 tablet (40 mg total) by mouth daily.  90 tablet  3  . guaiFENesin-dextromethorphan (ROBITUSSIN DM) 100-10 MG/5ML syrup Take 5 mLs by mouth 3 (three) times daily as needed for cough.  118 mL  0  . isosorbide mononitrate (IMDUR) 60 MG 24 hr tablet Take 1.5 tablets (90 mg total) by mouth daily. Pt request west-wards tabs  135 tablet  2  . lansoprazole (PREVACID) 30 MG capsule Take 1 capsule (30 mg total) by  mouth 2 (two) times daily.  180 capsule  3  . levothyroxine (SYNTHROID, LEVOTHROID) 50 MCG tablet Take 50 mcg by mouth daily.      . Multiple Vitamin (MULTIVITAMIN) tablet Take 1 tablet by mouth daily.        . nitroGLYCERIN (NITROSTAT) 0.4 MG SL tablet Place 1 tablet (0.4 mg total) under the tongue every 5 (five) minutes as needed. If pain after 3 pills- call 911  30 tablet  3  . pentosan polysulfate (ELMIRON) 100 MG capsule Take 100 mg by mouth 3 (three) times daily before meals.        . potassium chloride (K-DUR,KLOR-CON) 10 MEQ tablet Take 10 mEq by mouth daily.      . pravastatin (PRAVACHOL) 40 MG tablet Take 40 mg by mouth every morning.       No current facility-administered medications on file prior to visit.    BP 126/76  Pulse 85  Wt 146 lb (66.225 kg)  BMI 25.87 kg/m2  SpO2 93%chart    Objective:   Physical Exam  Constitutional: She is oriented to person, place, and time. She appears well-developed and well-nourished.  HENT:  Right Ear: External ear normal.  Left Ear: External ear normal.  Nose: Nose normal.  Mouth/Throat: Oropharynx is clear and moist.  Neck: Normal range of motion. Neck supple.  Cardiovascular: Normal rate, regular rhythm and normal heart sounds.   Pulmonary/Chest: Effort normal and breath sounds normal.  Diminished breath sounds but good air movement.   Musculoskeletal: Normal range of motion.  Neurological: She is alert and oriented to person, place, and time.  Skin: Skin is warm and dry.  Psychiatric: She has a normal mood and affect.          Assessment & Plan:  Assessment:  1. COPD/Bronchitis 2. Cough  Plan: D/C Levaquin. Start Doxycycline 100mg  1 tab twice a day x 10 days. Medrol dosepak as directed. Call the office if symptoms worsen or persist. Recheck as scheduled and as needed.

## 2013-05-12 ENCOUNTER — Ambulatory Visit
Admission: RE | Admit: 2013-05-12 | Discharge: 2013-05-12 | Disposition: A | Discharge status: Home / Self Care | Payer: Medicare Other | Source: Ambulatory Visit

## 2013-05-12 DIAGNOSIS — N3010 Interstitial cystitis (chronic) without hematuria: Secondary | ICD-10-CM | POA: Diagnosis not present

## 2013-05-12 DIAGNOSIS — Z1231 Encounter for screening mammogram for malignant neoplasm of breast: Secondary | ICD-10-CM

## 2013-05-17 ENCOUNTER — Telehealth: Payer: Self-pay | Admitting: Internal Medicine

## 2013-05-17 NOTE — Telephone Encounter (Signed)
Talked with pt and called breast center and had then look for paper for dr Lovell Sheehan to sign for [place]

## 2013-05-17 NOTE — Telephone Encounter (Signed)
Pt needs you to call her asap concerning new mammogram findings.

## 2013-05-18 ENCOUNTER — Other Ambulatory Visit: Payer: Self-pay | Admitting: Internal Medicine

## 2013-05-18 DIAGNOSIS — N63 Unspecified lump in unspecified breast: Secondary | ICD-10-CM

## 2013-05-19 ENCOUNTER — Encounter: Payer: Self-pay | Admitting: Physician Assistant

## 2013-05-19 ENCOUNTER — Inpatient Hospital Stay (HOSPITAL_COMMUNITY)
Admission: EM | Admit: 2013-05-19 | Discharge: 2013-05-23 | DRG: 202 | Disposition: A | Discharge status: Home / Self Care | Payer: Medicare Other | Attending: Internal Medicine | Admitting: Internal Medicine

## 2013-05-19 ENCOUNTER — Emergency Department (HOSPITAL_COMMUNITY): Payer: Medicare Other

## 2013-05-19 ENCOUNTER — Encounter (HOSPITAL_COMMUNITY): Payer: Self-pay | Admitting: Emergency Medicine

## 2013-05-19 ENCOUNTER — Ambulatory Visit (INDEPENDENT_AMBULATORY_CARE_PROVIDER_SITE_OTHER): Payer: Medicare Other | Admitting: Physician Assistant

## 2013-05-19 VITALS — BP 106/65 | HR 75 | Ht 63.0 in | Wt 147.0 lb

## 2013-05-19 DIAGNOSIS — Z8249 Family history of ischemic heart disease and other diseases of the circulatory system: Secondary | ICD-10-CM

## 2013-05-19 DIAGNOSIS — K219 Gastro-esophageal reflux disease without esophagitis: Secondary | ICD-10-CM | POA: Diagnosis not present

## 2013-05-19 DIAGNOSIS — T887XXA Unspecified adverse effect of drug or medicament, initial encounter: Secondary | ICD-10-CM

## 2013-05-19 DIAGNOSIS — J189 Pneumonia, unspecified organism: Secondary | ICD-10-CM

## 2013-05-19 DIAGNOSIS — Z833 Family history of diabetes mellitus: Secondary | ICD-10-CM | POA: Diagnosis not present

## 2013-05-19 DIAGNOSIS — E039 Hypothyroidism, unspecified: Secondary | ICD-10-CM

## 2013-05-19 DIAGNOSIS — I5032 Chronic diastolic (congestive) heart failure: Secondary | ICD-10-CM | POA: Diagnosis not present

## 2013-05-19 DIAGNOSIS — N390 Urinary tract infection, site not specified: Secondary | ICD-10-CM | POA: Diagnosis not present

## 2013-05-19 DIAGNOSIS — N3010 Interstitial cystitis (chronic) without hematuria: Secondary | ICD-10-CM

## 2013-05-19 DIAGNOSIS — E785 Hyperlipidemia, unspecified: Secondary | ICD-10-CM

## 2013-05-19 DIAGNOSIS — R079 Chest pain, unspecified: Secondary | ICD-10-CM | POA: Diagnosis not present

## 2013-05-19 DIAGNOSIS — J209 Acute bronchitis, unspecified: Secondary | ICD-10-CM | POA: Diagnosis not present

## 2013-05-19 DIAGNOSIS — I10 Essential (primary) hypertension: Secondary | ICD-10-CM

## 2013-05-19 DIAGNOSIS — M129 Arthropathy, unspecified: Secondary | ICD-10-CM | POA: Diagnosis present

## 2013-05-19 DIAGNOSIS — Z9089 Acquired absence of other organs: Secondary | ICD-10-CM

## 2013-05-19 DIAGNOSIS — M5416 Radiculopathy, lumbar region: Secondary | ICD-10-CM

## 2013-05-19 DIAGNOSIS — R059 Cough, unspecified: Secondary | ICD-10-CM

## 2013-05-19 DIAGNOSIS — Z79899 Other long term (current) drug therapy: Secondary | ICD-10-CM | POA: Diagnosis not present

## 2013-05-19 DIAGNOSIS — K5730 Diverticulosis of large intestine without perforation or abscess without bleeding: Secondary | ICD-10-CM

## 2013-05-19 DIAGNOSIS — I252 Old myocardial infarction: Secondary | ICD-10-CM | POA: Diagnosis not present

## 2013-05-19 DIAGNOSIS — J40 Bronchitis, not specified as acute or chronic: Secondary | ICD-10-CM | POA: Diagnosis not present

## 2013-05-19 DIAGNOSIS — Z8719 Personal history of other diseases of the digestive system: Secondary | ICD-10-CM

## 2013-05-19 DIAGNOSIS — Z87891 Personal history of nicotine dependence: Secondary | ICD-10-CM

## 2013-05-19 DIAGNOSIS — R55 Syncope and collapse: Secondary | ICD-10-CM

## 2013-05-19 DIAGNOSIS — I2510 Atherosclerotic heart disease of native coronary artery without angina pectoris: Secondary | ICD-10-CM | POA: Diagnosis not present

## 2013-05-19 DIAGNOSIS — I509 Heart failure, unspecified: Secondary | ICD-10-CM | POA: Diagnosis present

## 2013-05-19 DIAGNOSIS — R609 Edema, unspecified: Secondary | ICD-10-CM

## 2013-05-19 DIAGNOSIS — A498 Other bacterial infections of unspecified site: Secondary | ICD-10-CM | POA: Diagnosis present

## 2013-05-19 DIAGNOSIS — E1159 Type 2 diabetes mellitus with other circulatory complications: Secondary | ICD-10-CM | POA: Diagnosis present

## 2013-05-19 DIAGNOSIS — R509 Fever, unspecified: Secondary | ICD-10-CM | POA: Diagnosis not present

## 2013-05-19 DIAGNOSIS — Z9889 Other specified postprocedural states: Secondary | ICD-10-CM

## 2013-05-19 DIAGNOSIS — R5383 Other fatigue: Secondary | ICD-10-CM

## 2013-05-19 DIAGNOSIS — E876 Hypokalemia: Secondary | ICD-10-CM

## 2013-05-19 DIAGNOSIS — R05 Cough: Secondary | ICD-10-CM | POA: Diagnosis present

## 2013-05-19 LAB — COMPREHENSIVE METABOLIC PANEL
ALT: 16 U/L (ref 0–35)
AST: 21 U/L (ref 0–37)
Albumin: 3 g/dL — ABNORMAL LOW (ref 3.5–5.2)
Alkaline Phosphatase: 69 U/L (ref 39–117)
BUN: 16 mg/dL (ref 6–23)
Potassium: 3.7 mEq/L (ref 3.5–5.1)
Sodium: 140 mEq/L (ref 135–145)
Total Protein: 6.5 g/dL (ref 6.0–8.3)

## 2013-05-19 LAB — CBC WITH DIFFERENTIAL/PLATELET
Basophils Absolute: 0 10*3/uL (ref 0.0–0.1)
Basophils Relative: 0 % (ref 0–1)
Eosinophils Absolute: 0.5 10*3/uL (ref 0.0–0.7)
MCH: 29.2 pg (ref 26.0–34.0)
MCHC: 33.7 g/dL (ref 30.0–36.0)
Neutro Abs: 6.5 10*3/uL (ref 1.7–7.7)
Neutrophils Relative %: 72 % (ref 43–77)
Platelets: 172 10*3/uL (ref 150–400)
RDW: 14.3 % (ref 11.5–15.5)

## 2013-05-19 MED ORDER — LEVOFLOXACIN IN D5W 500 MG/100ML IV SOLN
500.0000 mg | Freq: Once | INTRAVENOUS | Status: AC
  Administered 2013-05-20: 500 mg via INTRAVENOUS
  Filled 2013-05-19: qty 100

## 2013-05-19 NOTE — Patient Instructions (Addendum)
NO CHANGES WERE MADE TODAY  YOU HAVE BEEN ADVISED TO GO TO THE ED FOR RECURRENCE OF PNEUMONIA

## 2013-05-19 NOTE — Progress Notes (Signed)
[address] [place], [place]  [address] Phone: [phone] Fax:  [phone]  Date:  05/19/2013   ID:  Victoria Gomez, DOB 01-22-1925, MRN [id number]  PCP:  Victoria Mew, MD  Cardiologist:  Dr. Rollene Rotunda    History of Present Illness: Victoria Gomez is a 79 y.o. female who returns for f/u after a recent admission to the hospital.  She has a hx of CAD, s/p prior stenting the RCA, diastolic CHF, syncope, HTN, HL, hypothyroidism, LBBB.  Myoview 3/12: Anterior ischemia. LHC 3/12: Proximal LAD 40%, mid LAD 25%, ostial D1 80% (small), AVCFX 25%, proximal OM1 25% with inferior branch 60%, ostial RCA occluded and filled with collaterals (old), EF 55% with inferior HK. Medical therapy continued. Echo 10/12: Mild LVH, EF 55-60%, grade 2 diastolic dysfunction, trace AI, MAC, mild-moderate LAE, mild RVH. Holter monitor 08/2012: NSR, atrial tach.   Patient was recently admitted 6/17-6/18 with chest discomfort and cough. Chest x-ray was negative. She was seen by cardiology. Enzymes were negative. She was not felt to have an ACS. It was felt that she likely had pneumonia and was treated with Levaquin. She was discharged to home. She followed up with her PCP. She continued to have a cough and she was switched to doxycycline. She finished this several days ago. Since then, she's developed recurrent cough with fevers of 101.3. She's been taking Tylenol to treat her fever. Sputum is white. She does have chills. She has some lower left-sided chest discomfort from time to time. She does note shortness of breath with activity. She denies syncope. She's been sleeping on 2 pillows. She denies PND. She denies significant edema.  Labs (10/13):  TSH 1.612 Labs (4/14):    LDL 80 Labs (6/14):    K 3.8, Cr 1.11, ALT 11, BNP 387, Hgb 11.7  Wt Readings from Last 3 Encounters:  05/19/13 147 lb (66.679 kg)  05/04/13 146 lb (66.225 kg)  04/27/13 147 lb (66.679 kg)     Past Medical History    Diagnosis Date  . HYPERLIPIDEMIA 03/07/2008  . HYPERTENSION 08/03/2007  . CORONARY ARTERY DISEASE 08/03/2007  . Acute on chronic systolic heart failure 05/23/2009  . HYPOTHYROIDISM 08/03/2007  . Esophageal reflux 10/18/2007  . DIVERTICULOSIS, COLON 08/03/2007  . DISORDER, BLADDER NEC 08/03/2007  . BREAST MASS, BENIGN 05/23/2009  . Edema 02/19/2009  . BARRETT'S ESOPHAGUS, HX OF 08/03/2007  . Interstitial cystitis   . Arthritis   . Internal hemorrhoids   . Epistaxis   . Right hand fracture   . Diarrhea   . Bladder disorder   . Myocardial infarction   . Anginal pain   . Dysrhythmia 08/2012    symptomatic bradycardia  . Shortness of breath     Current Outpatient Prescriptions  Medication Sig Dispense Refill  . albuterol (PROVENTIL HFA;VENTOLIN HFA) 108 (90 BASE) MCG/ACT inhaler Inhale 2 puffs into the lungs every 6 (six) hours as needed for wheezing or shortness of breath.  1 Inhaler  2  . amLODipine-valsartan (EXFORGE) 10-320 MG per tablet Take 1 tablet by mouth daily.  90 tablet  3  . aspirin 81 MG tablet Take 81 mg by mouth daily.        . Calcium Carbonate-Vitamin D (CALCIUM 600+D) 600-200 MG-UNIT TABS Take by mouth 2 (two) times daily.        . furosemide (LASIX) 40 MG tablet Take 1 tablet (40 mg total) by mouth daily.  90 tablet  3  . isosorbide  mononitrate (IMDUR) 60 MG 24 hr tablet Take 1.5 tablets (90 mg total) by mouth daily. Pt request west-wards tabs  135 tablet  2  . lansoprazole (PREVACID) 30 MG capsule Take 1 capsule (30 mg total) by mouth 2 (two) times daily.  180 capsule  3  . levothyroxine (SYNTHROID, LEVOTHROID) 50 MCG tablet Take 50 mcg by mouth daily.      . Multiple Vitamin (MULTIVITAMIN) tablet Take 1 tablet by mouth daily.        . nitroGLYCERIN (NITROSTAT) 0.4 MG SL tablet Place 1 tablet (0.4 mg total) under the tongue every 5 (five) minutes as needed. If pain after 3 pills- call 911  30 tablet  3  . pentosan polysulfate (ELMIRON) 100 MG capsule Take 100 mg by mouth  3 (three) times daily before meals.        . potassium chloride (K-DUR,KLOR-CON) 10 MEQ tablet Take 10 mEq by mouth daily.      . pravastatin (PRAVACHOL) 40 MG tablet Take 40 mg by mouth every morning.       No current facility-administered medications for this visit.    Allergies:    Allergies  Allergen Reactions  . Codeine Hives and Nausea And Vomiting  . Sulfonamide Derivatives Swelling    Turns red    Social History:  The patient  reports that she quit smoking about 43 years ago. Her smoking use included Cigarettes. She has a 15 pack-year smoking history. She has never used smokeless tobacco. She reports that she does not drink alcohol or use illicit drugs.   ROS:  Please see the history of present illness.      All other systems reviewed and negative.   PHYSICAL EXAM: VS:  BP 106/65  Pulse 75  Ht 5\' 3"  (1.6 m)  Wt 147 lb (66.679 kg)  BMI 26.05 kg/m2 Well nourished, well developed female who continues to cough throughout the interview and appears to be ill HEENT: normal Neck: no JVD Cardiac:  normal S1, S2; RRR; no murmur Lungs:  Left basilar crackles, no wheezing Abd: soft, nontender, no hepatomegaly Ext: trace ankle edema Skin: warm and dry Neuro:  CNs 2-12 intact, no focal abnormalities noted  EKG:  NSR, HR 75, LBBB     ASSESSMENT AND PLAN:  1. Pneumonia: The patient has significant symptoms of intractable cough as well as fever. She has an abnormal lung exam. She is of advanced age with other chronic co-comorbidities. She has CAD as well as diastolic CHF. I believe that she would best be served with admission to the hospital. She agrees. She has been asked to go to the emergency room at [facility]. We have contacted the charge nurse to notify them that the patient is coming. She will likely need admission by the internal medicine service. 2. CAD:  No angina.  continue ASA and statin. 3. Chronic Diastolic CHF:  She does not appear to be volume overloaded.  Her symptoms  seem to all be coming from pneumonia. 4. Hypertension:  Controlled.  Continue current therapy.  5. Hyperlipidemia:  Continue statin. 6. Disposition:  She will have her son take her to the ED today.  Signed, Tereso Newcomer, PA-C  05/19/2013 4:21 PM

## 2013-05-19 NOTE — ED Notes (Signed)
PT sent from PCP office because of PNA . Pt was recently D/C for same.

## 2013-05-19 NOTE — ED Notes (Signed)
PT. REPORTS PERSISTENT DRY COUGH FOR SEVERAL WEEKS WITH SOB AND LOW GRADE FEVER . SEEN AT [facility] TODAY SENT HERE TO RULE OUT PNEUMONIA.

## 2013-05-19 NOTE — ED Provider Notes (Signed)
History    CSN: [id number] Arrival date & time 05/19/13  1850  First MD Initiated Contact with Patient 05/19/13 2315     Chief Complaint  Patient presents with  . Cough   (Consider location/radiation/quality/duration/timing/severity/associated sxs/prior Treatment) HPI Hx per PT - evaluated by her cardiologist today for fevers and cough. She was instructed to go come to the ER to have ABX and be admitted for PNA. She has had fever to 101 at home with chills for the last few days, no hemoptysis. Has SOB. No CP. No leg pain or swelling, has been using inhalers and taking cough medications without relief. Symptoms MOD in severity.    Past Medical History  Diagnosis Date  . HYPERLIPIDEMIA 03/07/2008  . HYPERTENSION 08/03/2007  . CORONARY ARTERY DISEASE 08/03/2007  . Acute on chronic systolic heart failure 05/23/2009  . HYPOTHYROIDISM 08/03/2007  . Esophageal reflux 10/18/2007  . DIVERTICULOSIS, COLON 08/03/2007  . DISORDER, BLADDER NEC 08/03/2007  . BREAST MASS, BENIGN 05/23/2009  . Edema 02/19/2009  . BARRETT'S ESOPHAGUS, HX OF 08/03/2007  . Interstitial cystitis   . Arthritis   . Internal hemorrhoids   . Epistaxis   . Right hand fracture   . Diarrhea   . Bladder disorder   . Myocardial infarction   . Anginal pain   . Dysrhythmia 08/2012    symptomatic bradycardia  . Shortness of breath    Past Surgical History  Procedure Laterality Date  . Breast surgery      benign mass  . Cholecystectomy    . Cesarean section    . Appendectomy    . Cataract extraction, bilateral    . Tonsillectomy    . Abdominal hysterectomy    . Ankle reconstruction      Left  . Wrist reconstruction      Right   Family History  Problem Relation Age of Onset  . Colon cancer Mother   . Coronary artery disease Mother   . Heart disease Mother   . Coronary artery disease Father   . Heart disease Father   . Colon polyps Sister   . Coronary artery disease Sister   . Coronary artery disease Brother    . Diabetes type II Son    History  Substance Use Topics  . Smoking status: Former Smoker -- 1.00 packs/day for 15 years    Types: Cigarettes    Quit date: 11/10/1969  . Smokeless tobacco: Never Used  . Alcohol Use: No   OB History   Grav Para Term Preterm Abortions TAB SAB Ect Mult Living                 Review of Systems  Constitutional: Negative for fever and chills.  HENT: Negative for neck pain and neck stiffness.   Eyes: Negative for visual disturbance.  Respiratory: Positive for cough and shortness of breath.   Cardiovascular: Negative for chest pain.  Gastrointestinal: Negative for abdominal pain.  Genitourinary: Negative for flank pain.  Musculoskeletal: Negative for back pain.  Skin: Negative for rash.  Neurological: Negative for headaches.  All other systems reviewed and are negative.    Allergies  Codeine and Sulfonamide derivatives  Home Medications   Current Outpatient Rx  Name  Route  Sig  Dispense  Refill  . albuterol (PROVENTIL HFA;VENTOLIN HFA) 108 (90 BASE) MCG/ACT inhaler   Inhalation   Inhale 2 puffs into the lungs every 6 (six) hours as needed for wheezing or shortness of breath.   1 Inhaler  2   . amLODipine-valsartan (EXFORGE) 10-320 MG per tablet   Oral   Take 1 tablet by mouth daily.   90 tablet   3   . aspirin 81 MG tablet   Oral   Take 81 mg by mouth daily.           . Calcium Carbonate-Vitamin D (CALCIUM 600+D) 600-200 MG-UNIT TABS   Oral   Take by mouth 2 (two) times daily.           . furosemide (LASIX) 40 MG tablet   Oral   Take 1 tablet (40 mg total) by mouth daily.   90 tablet   3   . isosorbide mononitrate (IMDUR) 60 MG 24 hr tablet   Oral   Take 1.5 tablets (90 mg total) by mouth daily. Pt request west-wards tabs   135 tablet   2   . lansoprazole (PREVACID) 30 MG capsule   Oral   Take 1 capsule (30 mg total) by mouth 2 (two) times daily.   180 capsule   3   . levothyroxine (SYNTHROID, LEVOTHROID) 50  MCG tablet   Oral   Take 50 mcg by mouth daily.         . Multiple Vitamin (MULTIVITAMIN) tablet   Oral   Take 1 tablet by mouth daily.           . nitroGLYCERIN (NITROSTAT) 0.4 MG SL tablet   Sublingual   Place 1 tablet (0.4 mg total) under the tongue every 5 (five) minutes as needed. If pain after 3 pills- call 911   30 tablet   3   . pentosan polysulfate (ELMIRON) 100 MG capsule   Oral   Take 100 mg by mouth 3 (three) times daily before meals.           . potassium chloride (K-DUR,KLOR-CON) 10 MEQ tablet   Oral   Take 10 mEq by mouth daily.         . pravastatin (PRAVACHOL) 40 MG tablet   Oral   Take 40 mg by mouth every morning.          BP 107/55  Pulse 100  Temp(Src) 99.6 F (37.6 C) (Oral)  Resp 19  SpO2 97% Physical Exam  Nursing note and vitals reviewed. Constitutional: She is oriented to person, place, and time. She appears well-developed and well-nourished.  HENT:  Head: Normocephalic and atraumatic.  Mouth/Throat: Oropharynx is clear and moist.  Eyes: EOM are normal. Pupils are equal, round, and reactive to light.  Neck: Neck supple.  Cardiovascular: Normal rate, normal heart sounds and intact distal pulses.   Pulmonary/Chest: Effort normal. No respiratory distress.  Mildly decreased bilateral breath sounds, clear otherwise, dry cough on exam  Abdominal: Soft. Bowel sounds are normal. She exhibits no distension. There is no tenderness.  Musculoskeletal: Normal range of motion. She exhibits no edema and no tenderness.  Neurological: She is alert and oriented to person, place, and time.  Skin: Skin is warm and dry.    ED Course  Procedures (including critical care time) Labs Reviewed  CBC WITH DIFFERENTIAL - Abnormal; Notable for the following:    Hemoglobin 11.4 (*)    HCT 33.8 (*)    All other components within normal limits  COMPREHENSIVE METABOLIC PANEL - Abnormal; Notable for the following:    Glucose, Bld 117 (*)    Albumin 3.0 (*)     GFR calc non Af Amer 50 (*)    GFR calc Af Denyse Dago  58 (*)    All other components within normal limits   Dg Chest 2 View  05/19/2013   *RADIOLOGY REPORT*  Clinical Data: Shortness of breath, cough, congestion.  CHEST - 2 VIEW  Comparison: 04/26/2013  Findings: Heart is upper limits normal in size.  Biapical scarring. Mild linear densities in the lingula, likely atelectasis or scarring.  Mild peribronchial thickening.  No confluent opacities or effusions.  No acute bony abnormality.  IMPRESSION: Stable chronic changes.  Mild bronchitic changes.   Original Report Authenticated By: Charlett Nose, M.D.   IV ABx provided  0015: d/w Dr Julian Reil - he will evaluate patient bedside MDM  Fever/ cough in elderly PT with multiple co-morbidities  CXR, labs, IV ABX  O2 provided  Records reviewed including office visit today with recs for admit clinical pneumonia  Sunnie Nielsen, MD 05/21/13 0725

## 2013-05-19 NOTE — ED Notes (Addendum)
Pt ambulated around pod A with pulse ox. Pt never got below 96%. Pt ambulated to the restroom

## 2013-05-20 ENCOUNTER — Encounter (HOSPITAL_COMMUNITY): Payer: Self-pay | Admitting: Urology

## 2013-05-20 DIAGNOSIS — I5032 Chronic diastolic (congestive) heart failure: Secondary | ICD-10-CM | POA: Diagnosis not present

## 2013-05-20 DIAGNOSIS — J209 Acute bronchitis, unspecified: Secondary | ICD-10-CM

## 2013-05-20 DIAGNOSIS — R05 Cough: Secondary | ICD-10-CM | POA: Diagnosis not present

## 2013-05-20 DIAGNOSIS — I509 Heart failure, unspecified: Secondary | ICD-10-CM

## 2013-05-20 DIAGNOSIS — I10 Essential (primary) hypertension: Secondary | ICD-10-CM | POA: Diagnosis not present

## 2013-05-20 MED ORDER — PENTOSAN POLYSULFATE SODIUM 100 MG PO CAPS
100.0000 mg | ORAL_CAPSULE | Freq: Three times a day (TID) | ORAL | Status: DC
  Administered 2013-05-20 – 2013-05-23 (×11): 100 mg via ORAL
  Filled 2013-05-20 (×13): qty 1

## 2013-05-20 MED ORDER — ISOSORBIDE MONONITRATE ER 60 MG PO TB24
90.0000 mg | ORAL_TABLET | Freq: Every day | ORAL | Status: DC
  Administered 2013-05-20 – 2013-05-23 (×4): 90 mg via ORAL
  Filled 2013-05-20 (×4): qty 1

## 2013-05-20 MED ORDER — PANTOPRAZOLE SODIUM 40 MG PO TBEC
40.0000 mg | DELAYED_RELEASE_TABLET | Freq: Two times a day (BID) | ORAL | Status: DC
  Administered 2013-05-20 – 2013-05-23 (×6): 40 mg via ORAL
  Filled 2013-05-20 (×4): qty 1

## 2013-05-20 MED ORDER — SODIUM CHLORIDE 0.9 % IJ SOLN
3.0000 mL | Freq: Two times a day (BID) | INTRAMUSCULAR | Status: DC
  Administered 2013-05-20 – 2013-05-23 (×7): 3 mL via INTRAVENOUS

## 2013-05-20 MED ORDER — METHYLPREDNISOLONE 4 MG PO KIT
4.0000 mg | PACK | ORAL | Status: AC
  Administered 2013-05-20: 4 mg via ORAL

## 2013-05-20 MED ORDER — ADULT MULTIVITAMIN W/MINERALS CH
1.0000 | ORAL_TABLET | Freq: Every day | ORAL | Status: DC
  Administered 2013-05-20 – 2013-05-23 (×4): 1 via ORAL
  Filled 2013-05-20 (×4): qty 1

## 2013-05-20 MED ORDER — POTASSIUM CHLORIDE CRYS ER 10 MEQ PO TBCR
10.0000 meq | EXTENDED_RELEASE_TABLET | Freq: Every day | ORAL | Status: DC
  Administered 2013-05-20 – 2013-05-23 (×4): 10 meq via ORAL
  Filled 2013-05-20 (×4): qty 1

## 2013-05-20 MED ORDER — FUROSEMIDE 40 MG PO TABS
40.0000 mg | ORAL_TABLET | Freq: Every day | ORAL | Status: DC
  Administered 2013-05-20 – 2013-05-23 (×4): 40 mg via ORAL
  Filled 2013-05-20 (×4): qty 1

## 2013-05-20 MED ORDER — IRBESARTAN 300 MG PO TABS
300.0000 mg | ORAL_TABLET | Freq: Every day | ORAL | Status: DC
  Administered 2013-05-20 – 2013-05-23 (×4): 300 mg via ORAL
  Filled 2013-05-20 (×4): qty 1

## 2013-05-20 MED ORDER — METHYLPREDNISOLONE 4 MG PO KIT
8.0000 mg | PACK | Freq: Every morning | ORAL | Status: AC
  Administered 2013-05-20: 8 mg via ORAL
  Filled 2013-05-20: qty 21

## 2013-05-20 MED ORDER — METHYLPREDNISOLONE 4 MG PO KIT
8.0000 mg | PACK | Freq: Every evening | ORAL | Status: AC
  Administered 2013-05-20: 8 mg via ORAL

## 2013-05-20 MED ORDER — DEXTROSE 5 % IV SOLN
500.0000 mg | INTRAVENOUS | Status: DC
  Administered 2013-05-20 – 2013-05-22 (×3): 500 mg via INTRAVENOUS
  Filled 2013-05-20 (×3): qty 500

## 2013-05-20 MED ORDER — PANTOPRAZOLE SODIUM 40 MG PO TBEC
40.0000 mg | DELAYED_RELEASE_TABLET | Freq: Every day | ORAL | Status: DC
  Administered 2013-05-20: 40 mg via ORAL
  Filled 2013-05-20: qty 1

## 2013-05-20 MED ORDER — METHYLPREDNISOLONE 4 MG PO KIT
8.0000 mg | PACK | Freq: Every evening | ORAL | Status: AC
  Administered 2013-05-21: 8 mg via ORAL

## 2013-05-20 MED ORDER — SIMVASTATIN 20 MG PO TABS
20.0000 mg | ORAL_TABLET | Freq: Every day | ORAL | Status: DC
  Administered 2013-05-20 – 2013-05-22 (×3): 20 mg via ORAL
  Filled 2013-05-20 (×4): qty 1

## 2013-05-20 MED ORDER — ALBUTEROL SULFATE HFA 108 (90 BASE) MCG/ACT IN AERS: 2.0000 | INHALATION_SPRAY | Freq: Four times a day (QID) | RESPIRATORY_TRACT | Status: DC | PRN

## 2013-05-20 MED ORDER — METHYLPREDNISOLONE 4 MG PO KIT
4.0000 mg | PACK | Freq: Three times a day (TID) | ORAL | Status: AC
  Administered 2013-05-21 (×3): 4 mg via ORAL

## 2013-05-20 MED ORDER — LEVOTHYROXINE SODIUM 50 MCG PO TABS
50.0000 ug | ORAL_TABLET | Freq: Every day | ORAL | Status: DC
  Administered 2013-05-20 – 2013-05-23 (×4): 50 ug via ORAL
  Filled 2013-05-20 (×5): qty 1

## 2013-05-20 MED ORDER — CALCIUM CARBONATE-VITAMIN D 500-200 MG-UNIT PO TABS
1.0000 | ORAL_TABLET | Freq: Two times a day (BID) | ORAL | Status: DC
  Administered 2013-05-20 – 2013-05-23 (×7): 1 via ORAL
  Filled 2013-05-20 (×9): qty 1

## 2013-05-20 MED ORDER — SODIUM CHLORIDE 0.9 % IV SOLN
INTRAVENOUS | Status: DC
  Administered 2013-05-20: 10:00:00 via INTRAVENOUS

## 2013-05-20 MED ORDER — BENZONATATE 100 MG PO CAPS
200.0000 mg | ORAL_CAPSULE | Freq: Three times a day (TID) | ORAL | Status: DC | PRN
  Administered 2013-05-20 – 2013-05-23 (×5): 200 mg via ORAL
  Filled 2013-05-20 (×6): qty 2

## 2013-05-20 MED ORDER — AMLODIPINE BESYLATE-VALSARTAN 10-320 MG PO TABS: 1.0000 | ORAL_TABLET | Freq: Every day | ORAL | Status: DC

## 2013-05-20 MED ORDER — ENOXAPARIN SODIUM 40 MG/0.4ML ~~LOC~~ SOLN
40.0000 mg | SUBCUTANEOUS | Status: DC
  Administered 2013-05-20 – 2013-05-23 (×4): 40 mg via SUBCUTANEOUS
  Filled 2013-05-20 (×4): qty 0.4

## 2013-05-20 MED ORDER — METHYLPREDNISOLONE 4 MG PO KIT
4.0000 mg | PACK | Freq: Four times a day (QID) | ORAL | Status: DC
  Administered 2013-05-22 – 2013-05-23 (×6): 4 mg via ORAL

## 2013-05-20 MED ORDER — SODIUM CHLORIDE 0.9 % IJ SOLN: 3.0000 mL | INTRAMUSCULAR | Status: DC | PRN

## 2013-05-20 MED ORDER — AMLODIPINE BESYLATE 10 MG PO TABS
10.0000 mg | ORAL_TABLET | Freq: Every day | ORAL | Status: DC
  Administered 2013-05-20 – 2013-05-23 (×4): 10 mg via ORAL
  Filled 2013-05-20 (×4): qty 1

## 2013-05-20 MED ORDER — ASPIRIN EC 81 MG PO TBEC
81.0000 mg | DELAYED_RELEASE_TABLET | Freq: Every day | ORAL | Status: DC
  Administered 2013-05-20 – 2013-05-23 (×4): 81 mg via ORAL
  Filled 2013-05-20 (×4): qty 1

## 2013-05-20 NOTE — Progress Notes (Signed)
UR Completed.  [place] Lebeau Jane [phone] 05/20/2013  

## 2013-05-20 NOTE — Progress Notes (Signed)
Pt has order for whooping cough PCR to be collected.  Pt placed on droplet precautions and sample collected. Nino Glow RN

## 2013-05-20 NOTE — Progress Notes (Addendum)
The patient arrived to 71E03.  She was oriented to the unit and placed on telemetry.  The patient was assessed and VS were taken.  The bed alarm was put on due to the patient's weakness and the call bell was placed within reach.  She was complaining of cough, but did not complain of any pain at this time.  She was placed on 1L of humidified oxygen for comfort.  Her admission history was completed.  [facility] Admissions paged to notify the admitting physician of the patient's arrival to the unit.

## 2013-05-20 NOTE — Progress Notes (Addendum)
[facility] PROGRESS NOTE  JABREE PERNICE ZOX:[id number] DOB: 08-Sep-1925 DOA: 05/19/2013 PCP: Carrie Mew, MD I have seen and examined pt who is a an 79yo admitted this am by Dr Julian Reil with persistent cough and reported fevers following recent completion of 2nd course of abx and steroid taper, CXR neg for infiltrates>>bronchitic changes. Pt still with cough but it appears to be slightly less today. Pertussis pcr was ordered and is pending, will continue current management planas per Dr Lawanna Kobus and follow.     Kela Millin  [facility] Pager [phone]. If 7PM-7AM, please contact night-coverage at [email or web address], password [facility] 05/20/2013, 8:57 AM  LOS: 1 day

## 2013-05-20 NOTE — H&P (Signed)
[facility] History and Physical  Victoria Gomez WUJ:[id number] DOB: 03-13-1925 DOA: 05/19/2013  Referring physician: ED PCP: Carrie Mew, MD  Chief Complaint: Cough  HPI: Victoria Gomez is a 79 y.o. female who presents to the ED from her cardiologists office with ongoing symptoms of cough.  Symptoms have been going on for 2 weeks.  Levaquin started in the hospital during last admit, followed by a switch to doxycycline have not resolved her symptoms.  Her symptoms had improved until she finished the ABx and steroid taper on Monday then they returned.  Her cough was initially productive at the beginning of the illness, but during its return this week is nonproductive.  She is not short of breath, she is running fevers as high as 101 at home, her cardiologist sent her to the ED for admission due to concern for PNA.  In the ED she was not found to have PNA Tm has been 99.9, but hospitalist has still been asked to admit for bronchitis given the severity of her symptoms.  Review of Systems: 12 systems reviewed and otherwise negative.  Past Medical History  Diagnosis Date  . HYPERLIPIDEMIA 03/07/2008  . HYPERTENSION 08/03/2007  . CORONARY ARTERY DISEASE 08/03/2007  . Acute on chronic systolic heart failure 05/23/2009  . HYPOTHYROIDISM 08/03/2007  . Esophageal reflux 10/18/2007  . DIVERTICULOSIS, COLON 08/03/2007  . DISORDER, BLADDER NEC 08/03/2007  . BREAST MASS, BENIGN 05/23/2009  . Edema 02/19/2009  . BARRETT'S ESOPHAGUS, HX OF 08/03/2007  . Interstitial cystitis   . Arthritis   . Internal hemorrhoids   . Epistaxis   . Right hand fracture   . Diarrhea   . Bladder disorder   . Myocardial infarction   . Anginal pain   . Dysrhythmia 08/2012    symptomatic bradycardia  . Shortness of breath    Past Surgical History  Procedure Laterality Date  . Breast surgery      benign mass  . Cholecystectomy    . Cesarean section    . Appendectomy    . Cataract extraction, bilateral    .  Tonsillectomy    . Abdominal hysterectomy    . Ankle reconstruction      Left  . Wrist reconstruction      Right   Social History:  reports that she quit smoking about 43 years ago. Her smoking use included Cigarettes. She has a 15 pack-year smoking history. She has never used smokeless tobacco. She reports that she does not drink alcohol or use illicit drugs.   Allergies  Allergen Reactions  . Codeine Hives and Nausea And Vomiting  . Sulfonamide Derivatives Swelling    Turns red    Family History  Problem Relation Age of Onset  . Colon cancer Mother   . Coronary artery disease Mother   . Heart disease Mother   . Coronary artery disease Father   . Heart disease Father   . Colon polyps Sister   . Coronary artery disease Sister   . Coronary artery disease Brother   . Diabetes type II Son     Prior to Admission medications   Medication Sig Start Date End Date Taking? Authorizing Provider  albuterol (PROVENTIL HFA;VENTOLIN HFA) 108 (90 BASE) MCG/ACT inhaler Inhale 2 puffs into the lungs every 6 (six) hours as needed for wheezing or shortness of breath. 04/27/13  Yes Leroy Sea, MD  amLODipine-valsartan (EXFORGE) 10-320 MG per tablet Take 1 tablet by mouth daily. 02/14/13  Yes Stacie Glaze, MD  aspirin 81 MG tablet Take 81 mg by mouth daily.     Yes Historical Provider, MD  Calcium Carbonate-Vitamin D (CALCIUM 600+D) 600-200 MG-UNIT TABS Take by mouth 2 (two) times daily.     Yes Historical Provider, MD  furosemide (LASIX) 40 MG tablet Take 1 tablet (40 mg total) by mouth daily. 09/01/12  Yes Ripudeep Jenna Luo, MD  isosorbide mononitrate (IMDUR) 60 MG 24 hr tablet Take 1.5 tablets (90 mg total) by mouth daily. Pt request west-wards tabs 02/03/13  Yes Stacie Glaze, MD  lansoprazole (PREVACID) 30 MG capsule Take 1 capsule (30 mg total) by mouth 2 (two) times daily. 02/14/13 02/14/14 Yes Stacie Glaze, MD  levothyroxine (SYNTHROID, LEVOTHROID) 50 MCG tablet Take 50 mcg by mouth daily.    Yes Historical Provider, MD  Multiple Vitamin (MULTIVITAMIN) tablet Take 1 tablet by mouth daily.     Yes Historical Provider, MD  nitroGLYCERIN (NITROSTAT) 0.4 MG SL tablet Place 1 tablet (0.4 mg total) under the tongue every 5 (five) minutes as needed. If pain after 3 pills- call 911 11/15/12  Yes Stacie Glaze, MD  pentosan polysulfate (ELMIRON) 100 MG capsule Take 100 mg by mouth 3 (three) times daily before meals.     Yes Historical Provider, MD  potassium chloride (K-DUR,KLOR-CON) 10 MEQ tablet Take 10 mEq by mouth daily.   Yes Historical Provider, MD  pravastatin (PRAVACHOL) 40 MG tablet Take 40 mg by mouth every morning.   Yes Historical Provider, MD   Physical Exam: Filed Vitals:   05/20/13 0030 05/20/13 0119 05/20/13 0129 05/20/13 0504  BP: 133/47  120/45 113/59  Pulse: 74  68 84  Temp:   99.6 F (37.6 C) 99.2 F (37.3 C)  TempSrc:   Oral Oral  Resp: 22  21 20   Height:  5\' 3"  (1.6 m)    Weight:  67.132 kg (148 lb)    SpO2: 99%  99% 98%    General:  NAD, resting comfortably in bed Eyes: PEERLA EOMI ENT: mucous membranes moist Neck: supple w/o JVD Cardiovascular: RRR w/o MRG Respiratory: CTA B, has very frequent coughing spells in room, nonproductive Abdomen: soft, nt, nd, bs+ Skin: no rash nor lesion Musculoskeletal: MAE, full ROM all 4 extremities Psychiatric: normal tone and affect Neurologic: AAOx3, grossly non-focal  Labs on Admission:  Basic Metabolic Panel:  Recent Labs Lab 05/19/13 1913  NA 140  K 3.7  CL 106  CO2 23  GLUCOSE 117*  BUN 16  CREATININE 0.99  CALCIUM 8.7   Liver Function Tests:  Recent Labs Lab 05/19/13 1913  AST 21  ALT 16  ALKPHOS 69  BILITOT 0.4  PROT 6.5  ALBUMIN 3.0*   No results found for this basename: LIPASE, AMYLASE,  in the last 168 hours No results found for this basename: AMMONIA,  in the last 168 hours CBC:  Recent Labs Lab 05/19/13 1913  WBC 9.0  NEUTROABS 6.5  HGB 11.4*  HCT 33.8*  MCV 86.4  PLT 172    Cardiac Enzymes: No results found for this basename: CKTOTAL, CKMB, CKMBINDEX, TROPONINI,  in the last 168 hours  BNP (last 3 results)  Recent Labs  08/30/12 0437 04/26/13 1750  PROBNP 346.3 387.0   CBG: No results found for this basename: GLUCAP,  in the last 168 hours  Radiological Exams on Admission: Dg Chest 2 View  05/19/2013   *RADIOLOGY REPORT*  Clinical Data: Shortness of breath, cough, congestion.  CHEST - 2 VIEW  Comparison:  04/26/2013  Findings: Heart is upper limits normal in size.  Biapical scarring. Mild linear densities in the lingula, likely atelectasis or scarring.  Mild peribronchial thickening.  No confluent opacities or effusions.  No acute bony abnormality.  IMPRESSION: Stable chronic changes.  Mild bronchitic changes.   Original Report Authenticated By: Charlett Nose, M.D.    EKG: Independently reviewed.  Assessment/Plan Principal Problem:   Acute bronchitis Active Problems:   HYPERTENSION   Cough   1. Acute bronchitis - ordering pertussis PCR as her cough is very severe, starting medrol dose pak and azithromycin (since levaquin and doxycycline didn't fully clear what ever organism is causing this).  Dont think its the ARB causing this as her cough was initially productive during the course of the illness and she is still running fevers. 2. Cough - suppressing with tessalon, if this fails then will need an opiod to suppress most likely but is allergic to codine. 3. HTN - continue home meds 4. CHF - continue home meds    Code Status: Full Code (must indicate code status--if unknown or must be presumed, indicate so) Family Communication: No family in room (indicate person spoken with, if applicable, with phone number if by telephone) Disposition Plan: Admit to obs (indicate anticipated LOS)  Time spent: 70 min  Jaryan Chicoine M. [facility] Pager [phone]  If 7PM-7AM, please contact night-coverage [email or web address] Password [facility] 05/20/2013,  6:21 AM

## 2013-05-20 NOTE — ED Notes (Signed)
Report given to floor nurse and pt transported to the floor with tech. Pt alert and in NAD at time of admission

## 2013-05-20 NOTE — Care Management Note (Signed)
    Page 1 of 1   05/20/2013     5:25:28 PM   CARE MANAGEMENT NOTE 05/20/2013  Patient:  WILLENA, JEANCHARLES   Account Number:  [id number]  Date Initiated:  05/20/2013  Documentation initiated by:  Tera Mater  Subjective/Objective Assessment:   79yo female admitted with SOB.  Pt. lives with her son.     Action/Plan:   discharge planning   Anticipated DC Date:  05/21/2013   Anticipated DC Plan:  HOME/SELF CARE         Choice offered to / List presented to:             Status of service:  Completed, signed off Medicare Important Message given?   (If response is "NO", the following Medicare IM given date fields will be blank) Date Medicare IM given:   Date Additional Medicare IM given:    Discharge Disposition:  HOME/SELF CARE  Per UR Regulation:  Reviewed for med. necessity/level of care/duration of stay  If discussed at Long Length of Stay Meetings, dates discussed:    Comments:  05/20/13 1700 In to speak with pt. about discharge plans. Pt. states she lives with her son, and she gets along well. Pt. does not use any assistive devices to walk with and she is not interested in having any home health services.   Pt. will dc home with son. Tera Mater, RN, BSN NCM [phone]

## 2013-05-20 NOTE — Progress Notes (Signed)
Patient evaluated for community based chronic disease management services with [facility] Care Management Program as a benefit of patient's [employer].  Spoke with patient and son at bedside to explain [facility] Care Management services.  Family was very appreciative of information but feel that they can self manage at this time.  Left contact information and [facility] literature at bedside. Made inpatient Case Manager aware that [facility] Care Management following. Of note, [facility] Care Management services does not replace or interfere with any services that are arranged by inpatient case management or social work.  For additional questions or referrals please contact Anibal Henderson BSN RN [facility] Liaison at [phone].

## 2013-05-20 NOTE — Progress Notes (Signed)
[facility] admissions was paged to let them know that the physician had not seen the patient.  The Flow Nurse returned the call stating that the physician was aware.

## 2013-05-21 DIAGNOSIS — K219 Gastro-esophageal reflux disease without esophagitis: Secondary | ICD-10-CM | POA: Diagnosis not present

## 2013-05-21 DIAGNOSIS — I10 Essential (primary) hypertension: Secondary | ICD-10-CM | POA: Diagnosis not present

## 2013-05-21 DIAGNOSIS — J209 Acute bronchitis, unspecified: Secondary | ICD-10-CM | POA: Diagnosis not present

## 2013-05-21 DIAGNOSIS — I5032 Chronic diastolic (congestive) heart failure: Secondary | ICD-10-CM | POA: Diagnosis not present

## 2013-05-21 LAB — URINALYSIS, ROUTINE W REFLEX MICROSCOPIC
Bilirubin Urine: NEGATIVE
Hgb urine dipstick: NEGATIVE
Ketones, ur: NEGATIVE mg/dL
Nitrite: NEGATIVE
Specific Gravity, Urine: 1.018 (ref 1.005–1.030)
Urobilinogen, UA: 0.2 mg/dL (ref 0.0–1.0)

## 2013-05-21 LAB — BASIC METABOLIC PANEL
BUN: 19 mg/dL (ref 6–23)
Calcium: 9.5 mg/dL (ref 8.4–10.5)
GFR calc non Af Amer: 46 mL/min — ABNORMAL LOW (ref >90)
Glucose, Bld: 189 mg/dL — ABNORMAL HIGH (ref 70–99)

## 2013-05-21 LAB — CBC
HCT: 32.6 % — ABNORMAL LOW (ref 36.0–46.0)
Hemoglobin: 11 g/dL — ABNORMAL LOW (ref 12.0–15.0)
MCH: 28.8 pg (ref 26.0–34.0)
MCHC: 33.7 g/dL (ref 30.0–36.0)

## 2013-05-21 NOTE — Progress Notes (Signed)
Pt ambulating in room today, NAD noted, remains alert and oriented, able to communicate needs, will continue to monitor.

## 2013-05-21 NOTE — Progress Notes (Signed)
[facility] PROGRESS NOTE  Victoria Gomez WNU:[id number] DOB: Feb 04, 1925 DOA: 05/19/2013 PCP: Carrie Mew, MD  Assessment/Plan: Acute bronchitis/Cough  -continue steroids, abx, brochodilators and cough suppressants -follow pertussis PCR that is pending -pt beginning to improve, follow Active Problems:  HYPERTENSION  -continue out pt meds CHF -compensated, continue home meds GERD -continue PPI   Code Status: full Family Communication: directly with pt at bedside Disposition Plan: to home when medically stable   Consultants:  none  Procedures:  none  Antibiotics:  zithromax  HPI/Subjective: States cough paroxysms decreasing, denies CP, no SOB  Objective: Filed Vitals:   05/21/13 0417 05/21/13 0448 05/21/13 1023 05/21/13 1300  BP:  135/61 119/50 119/46  Pulse:  73  77  Temp:  98 F (36.7 C)  97.9 F (36.6 C)  TempSrc:  Oral  Oral  Resp:  21  20  Height:      Weight: 66.588 kg (146 lb 12.8 oz)     SpO2:  96%  100%    Intake/Output Summary (Last 24 hours) at 05/21/13 1518 Last data filed at 05/21/13 1426  Gross per 24 hour  Intake   1573 ml  Output    775 ml  Net    798 ml   Filed Weights   05/20/13 0119 05/21/13 0417  Weight: 67.132 kg (148 lb) 66.588 kg (146 lb 12.8 oz)   Exam:  General: alert & oriented x 3 In NAD Cardiovascular: RRR, nl S1 s2 Respiratory: CTAB Abdomen: soft +BS NT/ND, no masses palpable Extremities: No cyanosis and no edema    Data Reviewed: Basic Metabolic Panel:  Recent Labs Lab 05/19/13 1913 05/21/13 0405  NA 140 141  K 3.7 4.3  CL 106 105  CO2 23 26  GLUCOSE 117* 189*  BUN 16 19  CREATININE 0.99 1.06  CALCIUM 8.7 9.5   Liver Function Tests:  Recent Labs Lab 05/19/13 1913  AST 21  ALT 16  ALKPHOS 69  BILITOT 0.4  PROT 6.5  ALBUMIN 3.0*   No results found for this basename: LIPASE, AMYLASE,  in the last 168 hours No results found for this basename: AMMONIA,  in the last 168  hours CBC:  Recent Labs Lab 05/19/13 1913 05/21/13 0405  WBC 9.0 5.8  NEUTROABS 6.5  --   HGB 11.4* 11.0*  HCT 33.8* 32.6*  MCV 86.4 85.3  PLT 172 176   Cardiac Enzymes: No results found for this basename: CKTOTAL, CKMB, CKMBINDEX, TROPONINI,  in the last 168 hours BNP (last 3 results)  Recent Labs  08/30/12 0437 04/26/13 1750  PROBNP 346.3 387.0   CBG: No results found for this basename: GLUCAP,  in the last 168 hours  No results found for this or any previous visit (from the past 240 hour(s)).   Studies: Dg Chest 2 View  05/19/2013   *RADIOLOGY REPORT*  Clinical Data: Shortness of breath, cough, congestion.  CHEST - 2 VIEW  Comparison: 04/26/2013  Findings: Heart is upper limits normal in size.  Biapical scarring. Mild linear densities in the lingula, likely atelectasis or scarring.  Mild peribronchial thickening.  No confluent opacities or effusions.  No acute bony abnormality.  IMPRESSION: Stable chronic changes.  Mild bronchitic changes.   Original Report Authenticated By: Charlett Nose, M.D.    Scheduled Meds: . amLODipine  10 mg Oral Daily   And  . irbesartan  300 mg Oral Daily  . aspirin EC  81 mg Oral Daily  . azithromycin  500 mg Intravenous  Q24H  . calcium-vitamin D  1 tablet Oral BID WC  . enoxaparin (LOVENOX) injection  40 mg Subcutaneous Q24H  . furosemide  40 mg Oral Daily  . isosorbide mononitrate  90 mg Oral Daily  . levothyroxine  50 mcg Oral QAC breakfast  . methylPREDNISolone  4 mg Oral 3 x daily with food  . [START ON 05/22/2013] methylPREDNISolone  4 mg Oral 4X daily taper  . methylPREDNISolone  8 mg Oral Nightly  . multivitamin with minerals  1 tablet Oral Daily  . pantoprazole  40 mg Oral BID AC  . pentosan polysulfate  100 mg Oral TID AC  . potassium chloride  10 mEq Oral Daily  . simvastatin  20 mg Oral q1800  . sodium chloride  3 mL Intravenous Q12H   Continuous Infusions: . sodium chloride 20 mL/hr at 05/20/13 1020    Principal  Problem:   Acute bronchitis Active Problems:   HYPERTENSION   Cough    Time spent: 25    [facility] C  [facility] Pager [phone]. If 7PM-7AM, please contact night-coverage at [email or web address], password [facility] 05/21/2013, 3:18 PM  LOS: 2 days

## 2013-05-22 DIAGNOSIS — K219 Gastro-esophageal reflux disease without esophagitis: Secondary | ICD-10-CM | POA: Diagnosis not present

## 2013-05-22 DIAGNOSIS — I5032 Chronic diastolic (congestive) heart failure: Secondary | ICD-10-CM | POA: Diagnosis not present

## 2013-05-22 DIAGNOSIS — J209 Acute bronchitis, unspecified: Secondary | ICD-10-CM | POA: Diagnosis not present

## 2013-05-22 DIAGNOSIS — R05 Cough: Secondary | ICD-10-CM | POA: Diagnosis not present

## 2013-05-22 MED ORDER — AZITHROMYCIN 500 MG PO TABS
500.0000 mg | ORAL_TABLET | Freq: Every day | ORAL | Status: DC
  Administered 2013-05-23: 500 mg via ORAL
  Filled 2013-05-22: qty 1

## 2013-05-22 MED ORDER — HYDROCOD POLST-CHLORPHEN POLST 10-8 MG/5ML PO LQCR
5.0000 mL | Freq: Every evening | ORAL | Status: DC
  Administered 2013-05-22: 5 mL via ORAL
  Filled 2013-05-22 (×2): qty 5

## 2013-05-22 NOTE — Progress Notes (Signed)
Assessment completed, pt up ambulating on her room, denies any pain or discomfort, no distress noticed.  We'll continue with POC.

## 2013-05-22 NOTE — Progress Notes (Signed)
[facility] PROGRESS NOTE  Victoria Gomez ZOX:[id number] DOB: 10-13-1925 DOA: 05/19/2013 PCP: Carrie Mew, MD  Assessment/Plan: Acute bronchitis/Cough  -continue steroids, abx, brochodilators and cough suppressants -follow pertussis PCR that is pending -coough gradually improving, will add tussionex at bedtime Active Problems:  HYPERTENSION  -continue out pt meds CHF -compensated, continue home meds GERD -continue PPI   Code Status: full Family Communication: directly with pt at bedside Disposition Plan: to home when medically stable   Consultants:  none  Procedures:  none  Antibiotics:  zithromax  HPI/Subjective: States cough paroxysms less frequent, but seem to be worse at night  Objective: Filed Vitals:   05/21/13 1023 05/21/13 1300 05/21/13 2127 05/22/13 0500  BP: 119/50 119/46 100/70 132/61  Pulse:  77 72 73  Temp:  97.9 F (36.6 C) 98.7 F (37.1 C) 97.9 F (36.6 C)  TempSrc:  Oral Oral Oral  Resp:  20 20 20   Height:      Weight:    67.5 kg (148 lb 13 oz)  SpO2:  100% 96% 96%    Intake/Output Summary (Last 24 hours) at 05/22/13 0856 Last data filed at 05/22/13 0504  Gross per 24 hour  Intake   1343 ml  Output   1250 ml  Net     93 ml   Filed Weights   05/20/13 0119 05/21/13 0417 05/22/13 0500  Weight: 67.132 kg (148 lb) 66.588 kg (146 lb 12.8 oz) 67.5 kg (148 lb 13 oz)   Exam:  General: alert & oriented x 3 In NAD Cardiovascular: RRR, nl S1 s2 Respiratory: CTAB Abdomen: soft +BS NT/ND, no masses palpable Extremities: No cyanosis and no edema    Data Reviewed: Basic Metabolic Panel:  Recent Labs Lab 05/19/13 1913 05/21/13 0405  NA 140 141  K 3.7 4.3  CL 106 105  CO2 23 26  GLUCOSE 117* 189*  BUN 16 19  CREATININE 0.99 1.06  CALCIUM 8.7 9.5   Liver Function Tests:  Recent Labs Lab 05/19/13 1913  AST 21  ALT 16  ALKPHOS 69  BILITOT 0.4  PROT 6.5  ALBUMIN 3.0*   No results found for this basename:  LIPASE, AMYLASE,  in the last 168 hours No results found for this basename: AMMONIA,  in the last 168 hours CBC:  Recent Labs Lab 05/19/13 1913 05/21/13 0405  WBC 9.0 5.8  NEUTROABS 6.5  --   HGB 11.4* 11.0*  HCT 33.8* 32.6*  MCV 86.4 85.3  PLT 172 176   Cardiac Enzymes: No results found for this basename: CKTOTAL, CKMB, CKMBINDEX, TROPONINI,  in the last 168 hours BNP (last 3 results)  Recent Labs  08/30/12 0437 04/26/13 1750  PROBNP 346.3 387.0   CBG: No results found for this basename: GLUCAP,  in the last 168 hours  No results found for this or any previous visit (from the past 240 hour(s)).   Studies: No results found.  Scheduled Meds: . amLODipine  10 mg Oral Daily   And  . irbesartan  300 mg Oral Daily  . aspirin EC  81 mg Oral Daily  . azithromycin  500 mg Intravenous Q24H  . calcium-vitamin D  1 tablet Oral BID WC  . chlorpheniramine-HYDROcodone  5 mL Oral QPM  . enoxaparin (LOVENOX) injection  40 mg Subcutaneous Q24H  . furosemide  40 mg Oral Daily  . isosorbide mononitrate  90 mg Oral Daily  . levothyroxine  50 mcg Oral QAC breakfast  . methylPREDNISolone  4 mg Oral  4X daily taper  . multivitamin with minerals  1 tablet Oral Daily  . pantoprazole  40 mg Oral BID AC  . pentosan polysulfate  100 mg Oral TID AC  . potassium chloride  10 mEq Oral Daily  . simvastatin  20 mg Oral q1800  . sodium chloride  3 mL Intravenous Q12H   Continuous Infusions: . sodium chloride 20 mL/hr at 05/20/13 1020    Principal Problem:   Acute bronchitis Active Problems:   HYPERTENSION   Cough    Time spent: 25    [facility] C  [facility] Pager [phone]. If 7PM-7AM, please contact night-coverage at [email or web address], password [email or web address] 05/22/2013, 8:56 AM  LOS: 3 days

## 2013-05-22 NOTE — Progress Notes (Signed)
Pt alert and oriented, NAD NOTED, ABLE TO COMMUNICATE NEEDS, WILL CONTINUE TO MONITOR

## 2013-05-23 DIAGNOSIS — I5032 Chronic diastolic (congestive) heart failure: Secondary | ICD-10-CM | POA: Diagnosis not present

## 2013-05-23 DIAGNOSIS — N390 Urinary tract infection, site not specified: Secondary | ICD-10-CM

## 2013-05-23 DIAGNOSIS — J209 Acute bronchitis, unspecified: Secondary | ICD-10-CM | POA: Diagnosis not present

## 2013-05-23 DIAGNOSIS — R05 Cough: Secondary | ICD-10-CM | POA: Diagnosis not present

## 2013-05-23 LAB — BORDETELLA PERTUSSIS PCR: B pertussis, DNA: NOT DETECTED

## 2013-05-23 MED ORDER — AZITHROMYCIN 500 MG PO TABS: 500.0000 mg | ORAL_TABLET | Freq: Every day | ORAL | Status: DC

## 2013-05-23 MED ORDER — BENZONATATE 200 MG PO CAPS: 200.0000 mg | ORAL_CAPSULE | Freq: Three times a day (TID) | ORAL | Status: DC | PRN

## 2013-05-23 MED ORDER — CEFUROXIME AXETIL 500 MG PO TABS: 500.0000 mg | ORAL_TABLET | Freq: Two times a day (BID) | ORAL | Status: DC

## 2013-05-23 MED ORDER — DEXTROSE 5 % IV SOLN
1.0000 g | Freq: Once | INTRAVENOUS | Status: AC
  Administered 2013-05-23: 1 g via INTRAVENOUS
  Filled 2013-05-23: qty 10

## 2013-05-23 MED ORDER — HYDROCOD POLST-CHLORPHEN POLST 10-8 MG/5ML PO LQCR: 5.0000 mL | Freq: Every evening | ORAL | Status: DC

## 2013-05-23 MED ORDER — METHYLPREDNISOLONE 4 MG PO KIT: PACK | ORAL | Status: DC

## 2013-05-23 NOTE — Discharge Summary (Addendum)
Physician Discharge Summary  [name]:[id number] DOB: 1925/08/20 DOA: 05/19/2013  PCP: Carrie Mew, MD  Admit date: 05/19/2013 Discharge date: 05/23/2013  Time spent: <30 minutes  Recommendations for Outpatient Follow-up:      Follow-up Information   Follow up with Carrie Mew, MD. (in 1week, call for appt upon discharge)    Contact information:   [address] Christena Flake [place] [address] [phone]        Discharge Diagnoses:  Principal Problem:   Acute bronchitis Active Problems:   HYPERTENSION   Cough  Escherichia coli urinary tract affect  Discharge Condition: improved/stable  Diet recommendation: heart healthy  Filed Weights   05/21/13 0417 05/22/13 0500 05/23/13 0638  Weight: 66.588 kg (146 lb 12.8 oz) 67.5 kg (148 lb 13 oz) 67.813 kg (149 lb 8 oz)    History of present illness:  Victoria Gomez is a 79 y.o. female who presents to the ED from her cardiologists office with ongoing symptoms of cough. Symptoms have been going on for 2 weeks. Levaquin started in the hospital during last admit, followed by a switch to doxycycline have not resolved her symptoms. Her symptoms had improved until she finished the ABx and steroid taper on Monday then they returned. Her cough was initially productive at the beginning of the illness, but during its return this week is nonproductive. She is not short of breath, she is running fevers as high as 101 at home, her cardiologist sent her to the ED for admission due to concern for PNA.  In the ED she was not found to have PNA Tm has been 99.9, but hospitalist has still been asked to admit for bronchitis given the severity of her symptoms.   Hospital Course:  Acute bronchitis/Cough  -As discussed above, upon admission patient was placed on  steroids/Medrol dosepak, abx, brochodilators and cough suppressants -A chest x-ray was done and was negative for acute infiltrates.  - pertussis PCR was ordered on  admission and came back negative today -Her cough gradually improved with above treatments, she is clinically stable at this time and will be discharged for outpatient followup the Active Problems:  HYPERTENSION  -She was maintained on her outpatient medications during this hospital stay to CHF  -compensated, continue home meds  GERD  -continue PPI Escherichia coli urinary tract infection -Urine culture was done on admission and grew Escherichia coli sensitive to cephalosporins -Patient received IV Rocephin and will be discharged Ceftin upon discharge to complete a total of 7 days of antibiotics. She has been afebrile with no leukocytosis. She is to followup with her PCP Procedures: none Consultations:  none  Discharge Exam: Filed Vitals:   05/22/13 1300 05/22/13 2053 05/23/13 0638 05/23/13 1425  BP: 136/60 138/56 132/52 120/45  Pulse: 76 80 57 68  Temp: 98.4 F (36.9 C) 97.6 F (36.4 C) 98 F (36.7 C) 98.4 F (36.9 C)  TempSrc: Oral Oral Oral Oral  Resp: 20 18 19 18   Height:      Weight:   67.813 kg (149 lb 8 oz)   SpO2: 96% 95% 95% 96%   Exam:  General: alert & oriented x 3 In NAD  Cardiovascular: RRR, nl S1 s2  Respiratory: CTAB  Abdomen: soft +BS NT/ND, no masses palpable  Extremities: No cyanosis and no edema      Discharge Instructions  Discharge Orders   Future Appointments Provider Department Dept Phone   06/06/2013 1:45 PM Stacie Glaze, MD [facility] [phone]  Future Orders Complete By Expires     Diet - low sodium heart healthy  As directed     Increase activity slowly  As directed         Medication List         albuterol 108 (90 BASE) MCG/ACT inhaler  Commonly known as:  PROVENTIL HFA;VENTOLIN HFA  Inhale 2 puffs into the lungs every 6 (six) hours as needed for wheezing or shortness of breath.     amLODipine-valsartan 10-320 MG per tablet  Commonly known as:  EXFORGE  Take 1 tablet by mouth daily.     aspirin  81 MG tablet  Take 81 mg by mouth daily.     azithromycin 500 MG tablet  Commonly known as:  ZITHROMAX  Take 1 tablet (500 mg total) by mouth daily.     benzonatate 200 MG capsule  Commonly known as:  TESSALON  Take 1 capsule (200 mg total) by mouth 3 (three) times daily as needed for cough.     CALCIUM 600+D 600-200 MG-UNIT Tabs  Generic drug:  Calcium Carbonate-Vitamin D  Take by mouth 2 (two) times daily.     chlorpheniramine-HYDROcodone 10-8 MG/5ML Lqcr  Commonly known as:  TUSSIONEX  Take 5 mLs by mouth every evening.     furosemide 40 MG tablet  Commonly known as:  LASIX  Take 1 tablet (40 mg total) by mouth daily.     isosorbide mononitrate 60 MG 24 hr tablet  Commonly known as:  IMDUR  Take 1.5 tablets (90 mg total) by mouth daily. Pt request [place] tabs     lansoprazole 30 MG capsule  Commonly known as:  PREVACID  Take 1 capsule (30 mg total) by mouth 2 (two) times daily.     levothyroxine 50 MCG tablet  Commonly known as:  SYNTHROID, LEVOTHROID  Take 50 mcg by mouth daily.     methylPREDNISolone 4 MG tablet  Commonly known as:  MEDROL DOSEPAK  Take as directed- 1tab 3x/ day(today 7/14), then 1tab 2xday for 1day, then 1tab/day for 1day then stop     multivitamin tablet  Take 1 tablet by mouth daily.     nitroGLYCERIN 0.4 MG SL tablet  Commonly known as:  NITROSTAT  Place 1 tablet (0.4 mg total) under the tongue every 5 (five) minutes as needed. If pain after 3 pills- call 911     pentosan polysulfate 100 MG capsule  Commonly known as:  ELMIRON  Take 100 mg by mouth 3 (three) times daily before meals.     potassium chloride 10 MEQ tablet  Commonly known as:  K-DUR,KLOR-CON  Take 10 mEq by mouth daily.     pravastatin 40 MG tablet  Commonly known as:  PRAVACHOL  Take 40 mg by mouth every morning.       Ceftin 500 mg 1 tablet by mouth twice a day  Allergies  Allergen Reactions  . Codeine Hives and Nausea And Vomiting  . Sulfonamide Derivatives  Swelling    Turns red       Follow-up Information   Follow up with Carrie Mew, MD. (in 1week, call for appt upon discharge)    Contact information:   [address] Christena Flake [facility] [place] [address] [phone]        The results of significant diagnostics from this hospitalization (including imaging, microbiology, ancillary and laboratory) are listed below for reference.    Significant Diagnostic Studies: Dg Chest 2 View  05/19/2013   *RADIOLOGY REPORT*  Clinical  Data: Shortness of breath, cough, congestion.  CHEST - 2 VIEW  Comparison: 04/26/2013  Findings: Heart is upper limits normal in size.  Biapical scarring. Mild linear densities in the lingula, likely atelectasis or scarring.  Mild peribronchial thickening.  No confluent opacities or effusions.  No acute bony abnormality.  IMPRESSION: Stable chronic changes.  Mild bronchitic changes.   Original Report Authenticated By: Charlett Nose, M.D.   Dg Chest 2 View  04/26/2013   *RADIOLOGY REPORT*  Clinical Data: Chest pain and cough.  Ex-smoker.  CHEST - 2 VIEW  Comparison: To an 22-1013.  Findings: Stable borderline enlarged cardiac silhouette.  The lungs remain mildly hyperexpanded with mild diffuse peribronchial thickening and accentuation of the interstitial markings.  Stable linear scarring in the left lower lung zone.  Diffuse osteopenia. Cholecystectomy clips.  Coronary artery stent.  IMPRESSION:  1.  No acute abnormality. 2.  Stable borderline cardiomegaly and mild changes of COPD and chronic bronchitis.   Original Report Authenticated By: Beckie Salts, M.D.   Dg Ankle 2 Views Right  04/26/2013   *RADIOLOGY REPORT*  Clinical Data: Right ankle pain.  RIGHT ANKLE - 2 VIEW  Comparison: No priors.  Findings: AP and lateral views of the right ankle demonstrate some mild soft tissue swelling around the ankle joint.  No acute displaced fracture, subluxation or dislocation.  No retained radiopaque foreign body within the soft tissues of  the ankle.  IMPRESSION: Negative for acute bony abnormality or retained radiopaque foreign body.   Original Report Authenticated By: Trudie Reed, M.D.    Microbiology: Recent Results (from the past 240 hour(s))  BORDETELLA PERTUSSIS PCR     Status: None   Collection Time    05/20/13  5:58 PM      Result Value Range Status   B pertussis, DNA NOT DETECTED   Final   B parapertussis, DNA NOT DETECTED   Final   Comment: (NOTE)       Normal Reference Range for each Analyte: Not Detected     The performance characteristics of this assay were determined by     [employer] [facility]. This test has not been cleared or approved by     the U.S. Food and Drug Administration (FDA) for this specimen type.     The FDA has determined that such clearance or approval is not     necessary. This test is used for clinical purposes. It should be     regarded as investigational or for research. This laboratory is     certified under the Clinical Laboratory Improvement Amendments of 1988     (CLIA-88) as qualified to perform high complexity testing.  URINE CULTURE     Status: None   Collection Time    05/21/13  4:12 AM      Result Value Range Status   Specimen Description URINE, CLEAN CATCH   Final   Special Requests NONE   Final   Culture  Setup Time 05/21/2013 15:06   Final   Colony Count 45,000 COLONIES/ML   Final   Culture ESCHERICHIA COLI   Final   Report Status PENDING   Incomplete   Organism ID, Bacteria ESCHERICHIA COLI   Final     Labs: Basic Metabolic Panel:  Recent Labs Lab 05/19/13 1913 05/21/13 0405  NA 140 141  K 3.7 4.3  CL 106 105  CO2 23 26  GLUCOSE 117* 189*  BUN 16 19  CREATININE 0.99 1.06  CALCIUM 8.7 9.5   Liver  Function Tests:  Recent Labs Lab 05/19/13 1913  AST 21  ALT 16  ALKPHOS 69  BILITOT 0.4  PROT 6.5  ALBUMIN 3.0*   No results found for this basename: LIPASE, AMYLASE,  in the last 168 hours No results found for this basename: AMMONIA,  in the last  168 hours CBC:  Recent Labs Lab 05/19/13 1913 05/21/13 0405  WBC 9.0 5.8  NEUTROABS 6.5  --   HGB 11.4* 11.0*  HCT 33.8* 32.6*  MCV 86.4 85.3  PLT 172 176   Cardiac Enzymes: No results found for this basename: CKTOTAL, CKMB, CKMBINDEX, TROPONINI,  in the last 168 hours BNP: BNP (last 3 results)  Recent Labs  08/30/12 0437 04/26/13 1750  PROBNP 346.3 387.0   CBG: No results found for this basename: GLUCAP,  in the last 168 hours     Signed:  Nekeisha Aure C  [facility] 05/23/2013, 3:27 PM

## 2013-05-24 ENCOUNTER — Telehealth: Payer: Self-pay | Admitting: *Deleted

## 2013-05-24 LAB — URINE CULTURE

## 2013-05-24 NOTE — Telephone Encounter (Signed)
Transitional care  Admit date:7/08/11/13 Discharge date:05/23/2013  Discharge diagnoses: Principal problem:    Acute bronchitis Active problems:     HTN     Cough      E-Coli UTI  Discharge condition: improved/stable Discharged from hospital with z-pack,tessalon pearles,  Prednisone dose pack and ceftin, and Tussionex.  Talked with pt and she states she is feeling better and is complaint with new medication and old medication. Her appetitie is fairly good and she states it is always good when she is on prednisone,  States she is still coughing,but not as  Much.     Patient has an appointment with dr Amador Cunas for 7/ 23/2014 and pt is aware

## 2013-06-01 ENCOUNTER — Ambulatory Visit (INDEPENDENT_AMBULATORY_CARE_PROVIDER_SITE_OTHER): Payer: Medicare Other | Admitting: Internal Medicine

## 2013-06-01 ENCOUNTER — Encounter: Payer: Self-pay | Admitting: Internal Medicine

## 2013-06-01 VITALS — BP 110/66 | HR 70 | Temp 98.3°F | Resp 20 | Wt 148.0 lb

## 2013-06-01 DIAGNOSIS — E785 Hyperlipidemia, unspecified: Secondary | ICD-10-CM

## 2013-06-01 DIAGNOSIS — I2510 Atherosclerotic heart disease of native coronary artery without angina pectoris: Secondary | ICD-10-CM

## 2013-06-01 DIAGNOSIS — I5032 Chronic diastolic (congestive) heart failure: Secondary | ICD-10-CM

## 2013-06-01 DIAGNOSIS — R059 Cough, unspecified: Secondary | ICD-10-CM

## 2013-06-01 DIAGNOSIS — R05 Cough: Secondary | ICD-10-CM

## 2013-06-01 DIAGNOSIS — I10 Essential (primary) hypertension: Secondary | ICD-10-CM

## 2013-06-01 MED ORDER — BENZONATATE 200 MG PO CAPS: 200.0000 mg | ORAL_CAPSULE | Freq: Three times a day (TID) | ORAL | Status: DC | PRN

## 2013-06-01 NOTE — Progress Notes (Signed)
Subjective:    Patient ID: Victoria Gomez, female    DOB: June 25, 1925, 79 y.o.   MRN: [id number]  HPI   79 year old patient who is seen today following her hospital discharge. She was admitted for 4 days for evaluation and treatment of acute bronchitis. Since her discharge she has done well but remains slightly weak and still has a fairly significant nonproductive cough. She has coronary artery disease and a history of diastolic heart failure. She has dyslipidemia coronary artery disease and a history of hypertension. Hospital records reviewed Cough is nonproductive. No recurrent fever. She has had some fatigue but seems to be improving.  Past Medical History  Diagnosis Date  . HYPERLIPIDEMIA 03/07/2008  . HYPERTENSION 08/03/2007  . CORONARY ARTERY DISEASE 08/03/2007  . Acute on chronic systolic heart failure 05/23/2009  . HYPOTHYROIDISM 08/03/2007  . Esophageal reflux 10/18/2007  . DIVERTICULOSIS, COLON 08/03/2007  . DISORDER, BLADDER NEC 08/03/2007  . BREAST MASS, BENIGN 05/23/2009  . Edema 02/19/2009  . BARRETT'S ESOPHAGUS, HX OF 08/03/2007  . Interstitial cystitis   . Arthritis   . Internal hemorrhoids   . Epistaxis   . Right hand fracture   . Diarrhea   . Bladder disorder   . Myocardial infarction   . Anginal pain   . Dysrhythmia 08/2012    symptomatic bradycardia  . Shortness of breath     History   Social History  . Marital Status: Married    Spouse Name: N/A    Number of Children: N/A  . Years of Education: N/A   Occupational History  . retired    Social History Main Topics  . Smoking status: Former Smoker -- 1.00 packs/day for 15 years    Types: Cigarettes    Quit date: 11/10/1969  . Smokeless tobacco: Never Used  . Alcohol Use: No  . Drug Use: No  . Sexually Active: No   Other Topics Concern  . Not on file   Social History Narrative  . No narrative on file    Past Surgical History  Procedure Laterality Date  . Breast surgery      benign mass  .  Cholecystectomy    . Cesarean section    . Appendectomy    . Cataract extraction, bilateral    . Tonsillectomy    . Abdominal hysterectomy    . Ankle reconstruction      Left  . Wrist reconstruction      Right    Family History  Problem Relation Age of Onset  . Colon cancer Mother   . Coronary artery disease Mother   . Heart disease Mother   . Coronary artery disease Father   . Heart disease Father   . Colon polyps Sister   . Coronary artery disease Sister   . Coronary artery disease Brother   . Diabetes type II Son     Allergies  Allergen Reactions  . Codeine Hives and Nausea And Vomiting  . Sulfonamide Derivatives Swelling    Turns red    Current Outpatient Prescriptions on File Prior to Visit  Medication Sig Dispense Refill  . albuterol (PROVENTIL HFA;VENTOLIN HFA) 108 (90 BASE) MCG/ACT inhaler Inhale 2 puffs into the lungs every 6 (six) hours as needed for wheezing or shortness of breath.  1 Inhaler  2  . amLODipine-valsartan (EXFORGE) 10-320 MG per tablet Take 1 tablet by mouth daily.  90 tablet  3  . aspirin 81 MG tablet Take 81 mg by mouth daily.        [name]  azithromycin (ZITHROMAX) 500 MG tablet Take 1 tablet (500 mg total) by mouth daily.  2 tablet  0  . benzonatate (TESSALON) 200 MG capsule Take 1 capsule (200 mg total) by mouth 3 (three) times daily as needed for cough.  30 capsule  0  . Calcium Carbonate-Vitamin D (CALCIUM 600+D) 600-200 MG-UNIT TABS Take by mouth 2 (two) times daily.        . cefUROXime (CEFTIN) 500 MG tablet Take 1 tablet (500 mg total) by mouth 2 (two) times daily.  12 tablet  0  . chlorpheniramine-HYDROcodone (TUSSIONEX) 10-8 MG/5ML LQCR Take 5 mLs by mouth every evening.  100 mL  0  . furosemide (LASIX) 40 MG tablet Take 1 tablet (40 mg total) by mouth daily.  90 tablet  3  . isosorbide mononitrate (IMDUR) 60 MG 24 hr tablet Take 1.5 tablets (90 mg total) by mouth daily. Pt request west-wards tabs  135 tablet  2  . lansoprazole (PREVACID) 30  MG capsule Take 1 capsule (30 mg total) by mouth 2 (two) times daily.  180 capsule  3  . levothyroxine (SYNTHROID, LEVOTHROID) 50 MCG tablet Take 50 mcg by mouth daily.      . methylPREDNISolone (MEDROL DOSEPAK) 4 MG tablet Take as directed- 1tab 3x/ day(today 7/14), then 1tab 2xday for 1day, then 1tab/day for 1day then stop  21 tablet  0  . Multiple Vitamin (MULTIVITAMIN) tablet Take 1 tablet by mouth daily.        . nitroGLYCERIN (NITROSTAT) 0.4 MG SL tablet Place 1 tablet (0.4 mg total) under the tongue every 5 (five) minutes as needed. If pain after 3 pills- call 911  30 tablet  3  . pentosan polysulfate (ELMIRON) 100 MG capsule Take 100 mg by mouth 3 (three) times daily before meals.        . potassium chloride (K-DUR,KLOR-CON) 10 MEQ tablet Take 10 mEq by mouth daily.      . pravastatin (PRAVACHOL) 40 MG tablet Take 40 mg by mouth every morning.       No current facility-administered medications on file prior to visit.    BP 110/66  Pulse 70  Temp(Src) 98.3 F (36.8 C) (Oral)  Resp 20  Wt 148 lb (67.132 kg)  BMI 26.22 kg/m2  SpO2 97%       Review of Systems  Constitutional: Positive for fatigue.  HENT: Negative for hearing loss, congestion, sore throat, rhinorrhea, dental problem, sinus pressure and tinnitus.   Eyes: Negative for pain, discharge and visual disturbance.  Respiratory: Positive for cough. Negative for shortness of breath.   Cardiovascular: Negative for chest pain, palpitations and leg swelling.  Gastrointestinal: Negative for nausea, vomiting, abdominal pain, diarrhea, constipation, blood in stool and abdominal distention.  Genitourinary: Negative for dysuria, urgency, frequency, hematuria, flank pain, vaginal bleeding, vaginal discharge, difficulty urinating, vaginal pain and pelvic pain.  Musculoskeletal: Negative for joint swelling, arthralgias and gait problem.  Skin: Negative for rash.  Neurological: Negative for dizziness, syncope, speech difficulty,  weakness, numbness and headaches.  Hematological: Negative for adenopathy.  Psychiatric/Behavioral: Negative for behavioral problems, dysphoric mood and agitation. The patient is not nervous/anxious.        Objective:   Physical Exam  Constitutional: She is oriented to person, place, and time. She appears well-developed and well-nourished. No distress.  HENT:  Head: Normocephalic.  Right Ear: External ear normal.  Left Ear: External ear normal.  Mouth/Throat: Oropharynx is clear and moist.  Eyes: Conjunctivae and EOM are normal. Pupils are  equal, round, and reactive to light.  Neck: Normal range of motion. Neck supple. No thyromegaly present.  Cardiovascular: Normal rate, regular rhythm and normal heart sounds.   A few scattered ectopics Dorsalis pedis pulses full. Posterior tibial pulses not easily palpable   Pulmonary/Chest: Effort normal. She has rales.  A few crackles left base O2 saturation 97 Frequent paroxysms of coughing during exam  Abdominal: Soft. Bowel sounds are normal. She exhibits no mass. There is no tenderness.  Musculoskeletal: Normal range of motion. She exhibits no edema.  Lymphadenopathy:    She has no cervical adenopathy.  Neurological: She is alert and oriented to person, place, and time.  Skin: Skin is warm and dry. No rash noted.  Psychiatric: She has a normal mood and affect. Her behavior is normal.          Assessment & Plan:   Resolving bronchitis with persistent postinflammatory cough. We'll continue to treat the cough symptomatically History of diastolic heart failure. He appears compensated Hypertension well controlled Coronary artery disease stable  Medications updated Hospital records reviewed

## 2013-06-01 NOTE — Patient Instructions (Signed)
Take over-the-counter expectorants and cough medications such as  Mucinex DM.  Call if there is no improvement in 5 to 7 days or if he developed worsening cough, fever, or new symptoms, such as shortness of breath or chest pain.  Call or return to clinic prn if these symptoms worsen or fail to improve as anticipated.

## 2013-06-06 ENCOUNTER — Ambulatory Visit: Payer: Medicare Other | Admitting: Internal Medicine

## 2013-06-09 ENCOUNTER — Other Ambulatory Visit: Payer: Medicare Other

## 2013-06-16 DIAGNOSIS — N3010 Interstitial cystitis (chronic) without hematuria: Secondary | ICD-10-CM | POA: Diagnosis not present

## 2013-06-21 DIAGNOSIS — I10 Essential (primary) hypertension: Secondary | ICD-10-CM | POA: Diagnosis not present

## 2013-06-21 DIAGNOSIS — R05 Cough: Secondary | ICD-10-CM

## 2013-06-21 DIAGNOSIS — I5032 Chronic diastolic (congestive) heart failure: Secondary | ICD-10-CM

## 2013-06-21 DIAGNOSIS — I2510 Atherosclerotic heart disease of native coronary artery without angina pectoris: Secondary | ICD-10-CM

## 2013-06-21 DIAGNOSIS — E785 Hyperlipidemia, unspecified: Secondary | ICD-10-CM | POA: Diagnosis not present

## 2013-06-24 ENCOUNTER — Ambulatory Visit
Admission: RE | Admit: 2013-06-24 | Discharge: 2013-06-24 | Disposition: A | Discharge status: Home / Self Care | Payer: Medicare Other | Source: Ambulatory Visit | Attending: Internal Medicine | Admitting: Internal Medicine

## 2013-06-24 DIAGNOSIS — R928 Other abnormal and inconclusive findings on diagnostic imaging of breast: Secondary | ICD-10-CM | POA: Diagnosis not present

## 2013-06-24 DIAGNOSIS — N63 Unspecified lump in unspecified breast: Secondary | ICD-10-CM

## 2013-07-04 ENCOUNTER — Encounter: Payer: Self-pay | Admitting: Internal Medicine

## 2013-07-04 ENCOUNTER — Ambulatory Visit (INDEPENDENT_AMBULATORY_CARE_PROVIDER_SITE_OTHER): Payer: Medicare Other | Admitting: Internal Medicine

## 2013-07-04 VITALS — BP 130/60 | HR 72 | Temp 98.2°F | Resp 16 | Ht 63.0 in | Wt 151.0 lb

## 2013-07-04 DIAGNOSIS — R609 Edema, unspecified: Secondary | ICD-10-CM | POA: Diagnosis not present

## 2013-07-04 DIAGNOSIS — I10 Essential (primary) hypertension: Secondary | ICD-10-CM | POA: Diagnosis not present

## 2013-07-04 DIAGNOSIS — F4321 Adjustment disorder with depressed mood: Secondary | ICD-10-CM | POA: Diagnosis not present

## 2013-07-04 DIAGNOSIS — R600 Localized edema: Secondary | ICD-10-CM

## 2013-07-04 MED ORDER — POTASSIUM CHLORIDE CRYS ER 10 MEQ PO TBCR: 10.0000 meq | EXTENDED_RELEASE_TABLET | Freq: Every day | ORAL | Status: DC

## 2013-07-04 MED ORDER — ALPRAZOLAM 0.25 MG PO TABS: 0.2500 mg | ORAL_TABLET | Freq: Three times a day (TID) | ORAL | Status: DC | PRN

## 2013-07-04 NOTE — Progress Notes (Signed)
Subjective:    Patient ID: Victoria Gomez, female    DOB: 05/04/1925, 79 y.o.   MRN: [id number]  HPI Patient is a 79 year old female who presents with increased edema in her lower extremities.  She states that she has been eating more salt in recently and her feet for more hours in recent x2 to the death of her husband and the multiple visitors in her home.  She denies any excessive shortness of breath PND orthopnea she states that she has not been nauseated or had any chest pain   Review of Systems  Constitutional: Negative.  Negative for activity change, appetite change and fatigue.  HENT: Negative.  Negative for ear pain, congestion, neck pain, postnasal drip and sinus pressure.   Eyes: Negative for redness and visual disturbance.  Respiratory: Positive for shortness of breath. Negative for cough and wheezing.   Cardiovascular: Positive for leg swelling.       Edema in feet  Gastrointestinal: Negative for abdominal pain and abdominal distention.  Endocrine: Negative.   Genitourinary: Negative.  Negative for dysuria, frequency and menstrual problem.  Musculoskeletal: Negative for myalgias, joint swelling and arthralgias.  Skin: Negative for rash and wound.  Allergic/Immunologic: Negative.   Neurological: Negative.  Negative for dizziness, weakness and headaches.  Hematological: Negative for adenopathy. Does not bruise/bleed easily.  Psychiatric/Behavioral: Negative for sleep disturbance and decreased concentration.   Past Medical History  Diagnosis Date  . HYPERLIPIDEMIA 03/07/2008  . HYPERTENSION 08/03/2007  . CORONARY ARTERY DISEASE 08/03/2007  . Acute on chronic systolic heart failure 05/23/2009  . HYPOTHYROIDISM 08/03/2007  . Esophageal reflux 10/18/2007  . DIVERTICULOSIS, COLON 08/03/2007  . DISORDER, BLADDER NEC 08/03/2007  . BREAST MASS, BENIGN 05/23/2009  . Edema 02/19/2009  . BARRETT'S ESOPHAGUS, HX OF 08/03/2007  . Interstitial cystitis   . Arthritis   . Internal  hemorrhoids   . Epistaxis   . Right hand fracture   . Diarrhea   . Bladder disorder   . Myocardial infarction   . Anginal pain   . Dysrhythmia 08/2012    symptomatic bradycardia  . Shortness of breath     History   Social History  . Marital Status: Married    Spouse Name: N/A    Number of Children: N/A  . Years of Education: N/A   Occupational History  . retired    Social History Main Topics  . Smoking status: Former Smoker -- 1.00 packs/day for 15 years    Types: Cigarettes    Quit date: 11/10/1969  . Smokeless tobacco: Never Used  . Alcohol Use: No  . Drug Use: No  . Sexual Activity: No   Other Topics Concern  . Not on file   Social History Narrative  . No narrative on file    Past Surgical History  Procedure Laterality Date  . Breast surgery      benign mass  . Cholecystectomy    . Cesarean section    . Appendectomy    . Cataract extraction, bilateral    . Tonsillectomy    . Abdominal hysterectomy    . Ankle reconstruction      Left  . Wrist reconstruction      Right    Family History  Problem Relation Age of Onset  . Colon cancer Mother   . Coronary artery disease Mother   . Heart disease Mother   . Coronary artery disease Father   . Heart disease Father   . Colon polyps Sister   .  Coronary artery disease Sister   . Coronary artery disease Brother   . Diabetes type II Son     Allergies  Allergen Reactions  . Codeine Hives and Nausea And Vomiting  . Sulfonamide Derivatives Swelling    Turns red    Current Outpatient Prescriptions on File Prior to Visit  Medication Sig Dispense Refill  . albuterol (PROVENTIL HFA;VENTOLIN HFA) 108 (90 BASE) MCG/ACT inhaler Inhale 2 puffs into the lungs every 6 (six) hours as needed for wheezing or shortness of breath.  1 Inhaler  2  . amLODipine-valsartan (EXFORGE) 10-320 MG per tablet Take 1 tablet by mouth daily.  90 tablet  3  . aspirin 81 MG tablet Take 81 mg by mouth daily.        . Calcium  Carbonate-Vitamin D (CALCIUM 600+D) 600-200 MG-UNIT TABS Take by mouth 2 (two) times daily.        . furosemide (LASIX) 40 MG tablet Take 1 tablet (40 mg total) by mouth daily.  90 tablet  3  . isosorbide mononitrate (IMDUR) 60 MG 24 hr tablet Take 1.5 tablets (90 mg total) by mouth daily. Pt request west-wards tabs  135 tablet  2  . lansoprazole (PREVACID) 30 MG capsule Take 1 capsule (30 mg total) by mouth 2 (two) times daily.  180 capsule  3  . levothyroxine (SYNTHROID, LEVOTHROID) 50 MCG tablet Take 50 mcg by mouth daily.      . methylPREDNISolone (MEDROL DOSEPAK) 4 MG tablet Take as directed- 1tab 3x/ day(today 7/14), then 1tab 2xday for 1day, then 1tab/day for 1day then stop  21 tablet  0  . Multiple Vitamin (MULTIVITAMIN) tablet Take 1 tablet by mouth daily.        . nitroGLYCERIN (NITROSTAT) 0.4 MG SL tablet Place 1 tablet (0.4 mg total) under the tongue every 5 (five) minutes as needed. If pain after 3 pills- call 911  30 tablet  3  . pentosan polysulfate (ELMIRON) 100 MG capsule Take 100 mg by mouth 3 (three) times daily before meals.        . pravastatin (PRAVACHOL) 40 MG tablet Take 40 mg by mouth every morning.       No current facility-administered medications on file prior to visit.    BP 130/60  Pulse 72  Temp(Src) 98.2 F (36.8 C)  Resp 16  Ht 5\' 3"  (1.6 m)  Wt 151 lb (68.493 kg)  BMI 26.76 kg/m2       Objective:   Physical Exam  Nursing note and vitals reviewed. Constitutional: She is oriented to person, place, and time. She appears well-developed and well-nourished. No distress.  Eyes: Conjunctivae are normal. Pupils are equal, round, and reactive to light.  Neck: Normal range of motion. Neck supple.  Cardiovascular:  Murmur heard. Irregular rate and rhythm 3+ pitting edema to midcalf  Pulmonary/Chest: Effort normal and breath sounds normal.  Abdominal: Soft. Bowel sounds are normal.  Musculoskeletal: She exhibits edema and tenderness.  Neurological: She is  alert and oriented to person, place, and time.  Skin: She is not diaphoretic.          Assessment & Plan:  Acute lower extremity edema secondary to increased salt intake and positional.  Recommend Ace bandage wraps elevate legs as much as possible and increase Lasix by 20 mg by mouth daily with an additional 10 mEq of potassium.  She'll contact our office if this does not alleviate the excessive edema Alternatives would be compression stockings There is no evidence of  an congestive heart failure at this time lung fields are clear she denies any chest pain or abnormal shortness of breath

## 2013-07-04 NOTE — Patient Instructions (Addendum)
Take an extra 1/4 lasix  ( 60 mg) for 3 days Elevated legs and keep the compression wrap in place  Take an extra potassium for the next three days

## 2013-08-27 DIAGNOSIS — M5137 Other intervertebral disc degeneration, lumbosacral region: Secondary | ICD-10-CM | POA: Diagnosis not present

## 2013-08-27 DIAGNOSIS — M545 Low back pain: Secondary | ICD-10-CM | POA: Diagnosis not present

## 2013-09-01 DIAGNOSIS — H35369 Drusen (degenerative) of macula, unspecified eye: Secondary | ICD-10-CM | POA: Diagnosis not present

## 2013-09-01 DIAGNOSIS — H35379 Puckering of macula, unspecified eye: Secondary | ICD-10-CM | POA: Diagnosis not present

## 2013-09-01 DIAGNOSIS — H524 Presbyopia: Secondary | ICD-10-CM | POA: Diagnosis not present

## 2013-09-01 DIAGNOSIS — H26499 Other secondary cataract, unspecified eye: Secondary | ICD-10-CM | POA: Diagnosis not present

## 2013-09-01 DIAGNOSIS — H04129 Dry eye syndrome of unspecified lacrimal gland: Secondary | ICD-10-CM | POA: Diagnosis not present

## 2013-09-01 DIAGNOSIS — Z961 Presence of intraocular lens: Secondary | ICD-10-CM | POA: Diagnosis not present

## 2013-09-01 DIAGNOSIS — H43819 Vitreous degeneration, unspecified eye: Secondary | ICD-10-CM | POA: Diagnosis not present

## 2013-09-09 DIAGNOSIS — M4310 Spondylolisthesis, site unspecified: Secondary | ICD-10-CM | POA: Diagnosis not present

## 2013-09-09 DIAGNOSIS — M5137 Other intervertebral disc degeneration, lumbosacral region: Secondary | ICD-10-CM | POA: Diagnosis not present

## 2013-09-17 ENCOUNTER — Other Ambulatory Visit: Payer: Self-pay | Admitting: Internal Medicine

## 2013-09-20 ENCOUNTER — Emergency Department (HOSPITAL_COMMUNITY): Payer: Medicare Other

## 2013-09-20 ENCOUNTER — Encounter (HOSPITAL_COMMUNITY): Payer: Self-pay | Admitting: Emergency Medicine

## 2013-09-20 ENCOUNTER — Inpatient Hospital Stay (HOSPITAL_COMMUNITY)
Admission: EM | Admit: 2013-09-20 | Discharge: 2013-09-22 | DRG: 308 | Disposition: A | Discharge status: Home / Self Care | Payer: Medicare Other | Attending: Cardiovascular Disease | Admitting: Cardiovascular Disease

## 2013-09-20 DIAGNOSIS — N3010 Interstitial cystitis (chronic) without hematuria: Secondary | ICD-10-CM | POA: Diagnosis present

## 2013-09-20 DIAGNOSIS — K5730 Diverticulosis of large intestine without perforation or abscess without bleeding: Secondary | ICD-10-CM | POA: Diagnosis present

## 2013-09-20 DIAGNOSIS — I447 Left bundle-branch block, unspecified: Secondary | ICD-10-CM | POA: Diagnosis present

## 2013-09-20 DIAGNOSIS — Z9861 Coronary angioplasty status: Secondary | ICD-10-CM | POA: Diagnosis not present

## 2013-09-20 DIAGNOSIS — I4892 Unspecified atrial flutter: Secondary | ICD-10-CM | POA: Diagnosis not present

## 2013-09-20 DIAGNOSIS — I129 Hypertensive chronic kidney disease with stage 1 through stage 4 chronic kidney disease, or unspecified chronic kidney disease: Secondary | ICD-10-CM | POA: Diagnosis present

## 2013-09-20 DIAGNOSIS — E039 Hypothyroidism, unspecified: Secondary | ICD-10-CM | POA: Diagnosis present

## 2013-09-20 DIAGNOSIS — I10 Essential (primary) hypertension: Secondary | ICD-10-CM

## 2013-09-20 DIAGNOSIS — K219 Gastro-esophageal reflux disease without esophagitis: Secondary | ICD-10-CM | POA: Diagnosis present

## 2013-09-20 DIAGNOSIS — I2510 Atherosclerotic heart disease of native coronary artery without angina pectoris: Secondary | ICD-10-CM | POA: Diagnosis present

## 2013-09-20 DIAGNOSIS — I5030 Unspecified diastolic (congestive) heart failure: Secondary | ICD-10-CM | POA: Diagnosis not present

## 2013-09-20 DIAGNOSIS — Z79899 Other long term (current) drug therapy: Secondary | ICD-10-CM | POA: Diagnosis not present

## 2013-09-20 DIAGNOSIS — E1129 Type 2 diabetes mellitus with other diabetic kidney complication: Secondary | ICD-10-CM

## 2013-09-20 DIAGNOSIS — N183 Chronic kidney disease, stage 3 unspecified: Secondary | ICD-10-CM | POA: Diagnosis present

## 2013-09-20 DIAGNOSIS — I369 Nonrheumatic tricuspid valve disorder, unspecified: Secondary | ICD-10-CM | POA: Diagnosis not present

## 2013-09-20 DIAGNOSIS — I252 Old myocardial infarction: Secondary | ICD-10-CM

## 2013-09-20 DIAGNOSIS — I4891 Unspecified atrial fibrillation: Principal | ICD-10-CM

## 2013-09-20 DIAGNOSIS — I4821 Permanent atrial fibrillation: Secondary | ICD-10-CM | POA: Diagnosis present

## 2013-09-20 DIAGNOSIS — I509 Heart failure, unspecified: Secondary | ICD-10-CM | POA: Diagnosis present

## 2013-09-20 DIAGNOSIS — E785 Hyperlipidemia, unspecified: Secondary | ICD-10-CM | POA: Diagnosis present

## 2013-09-20 DIAGNOSIS — Z87891 Personal history of nicotine dependence: Secondary | ICD-10-CM | POA: Diagnosis not present

## 2013-09-20 DIAGNOSIS — E118 Type 2 diabetes mellitus with unspecified complications: Secondary | ICD-10-CM

## 2013-09-20 DIAGNOSIS — I498 Other specified cardiac arrhythmias: Secondary | ICD-10-CM | POA: Diagnosis present

## 2013-09-20 DIAGNOSIS — I5043 Acute on chronic combined systolic (congestive) and diastolic (congestive) heart failure: Secondary | ICD-10-CM | POA: Diagnosis present

## 2013-09-20 DIAGNOSIS — R002 Palpitations: Secondary | ICD-10-CM | POA: Diagnosis not present

## 2013-09-20 HISTORY — DX: Left bundle-branch block, unspecified: I44.7

## 2013-09-20 HISTORY — DX: Orthostatic hypotension: I95.1

## 2013-09-20 HISTORY — DX: Unspecified atrial fibrillation: I48.91

## 2013-09-20 HISTORY — DX: Bradycardia, unspecified: R00.1

## 2013-09-20 LAB — URINALYSIS, ROUTINE W REFLEX MICROSCOPIC
Glucose, UA: NEGATIVE mg/dL
Leukocytes, UA: NEGATIVE
Nitrite: NEGATIVE
Protein, ur: NEGATIVE mg/dL
Specific Gravity, Urine: 1.026 (ref 1.005–1.030)
Urobilinogen, UA: 0.2 mg/dL (ref 0.0–1.0)

## 2013-09-20 LAB — CBC WITH DIFFERENTIAL/PLATELET
Basophils Absolute: 0 10*3/uL (ref 0.0–0.1)
Basophils Relative: 0 % (ref 0–1)
Eosinophils Absolute: 0 10*3/uL (ref 0.0–0.7)
Hemoglobin: 12.5 g/dL (ref 12.0–15.0)
MCH: 28.5 pg (ref 26.0–34.0)
MCHC: 34.1 g/dL (ref 30.0–36.0)
Neutro Abs: 8.5 10*3/uL — ABNORMAL HIGH (ref 1.7–7.7)
Neutrophils Relative %: 80 % — ABNORMAL HIGH (ref 43–77)
Platelets: 223 10*3/uL (ref 150–400)

## 2013-09-20 LAB — COMPREHENSIVE METABOLIC PANEL
ALT: 22 U/L (ref 0–35)
AST: 20 U/L (ref 0–37)
Albumin: 3.4 g/dL — ABNORMAL LOW (ref 3.5–5.2)
Alkaline Phosphatase: 87 U/L (ref 39–117)
Chloride: 107 mEq/L (ref 96–112)
Potassium: 4.3 mEq/L (ref 3.5–5.1)
Sodium: 139 mEq/L (ref 135–145)
Total Bilirubin: 0.2 mg/dL — ABNORMAL LOW (ref 0.3–1.2)
Total Protein: 6.6 g/dL (ref 6.0–8.3)

## 2013-09-20 LAB — PRO B NATRIURETIC PEPTIDE: Pro B Natriuretic peptide (BNP): 1425 pg/mL — ABNORMAL HIGH (ref 0–450)

## 2013-09-20 NOTE — ED Notes (Addendum)
Pt and pts husband report concern regarding pts HR, they report it has been fluctuating lately, and her HR is usually 50s-60s and lately it has been between the 90s-120s. No history of afib or any other arrythmia but reports she was told by Dr. Lovell Sheehan that her  Heart has been "skipping a beat" and hx of symptomatic bradycardia.

## 2013-09-20 NOTE — ED Provider Notes (Signed)
CSN: [id number]     Arrival date & time 09/20/13  1934 History   First MD Initiated Contact with Patient 09/20/13 2128     Chief Complaint  Patient presents with  . Palpitations   (Consider location/radiation/quality/duration/timing/severity/associated sxs/prior Treatment) Patient is a 79 y.o. female presenting with palpitations.  Palpitations  Pt with extensive medical history including CAD but no previous Afib reports increased HR at home since yesterday measured in the 120s on home BP monitor. She denies any CP or SOB but has felt lightheaded and dizzy at times.   Past Medical History  Diagnosis Date  . HYPERLIPIDEMIA 03/07/2008  . HYPERTENSION 08/03/2007  . CORONARY ARTERY DISEASE 08/03/2007  . Acute on chronic systolic heart failure 05/23/2009  . HYPOTHYROIDISM 08/03/2007  . Esophageal reflux 10/18/2007  . DIVERTICULOSIS, COLON 08/03/2007  . DISORDER, BLADDER NEC 08/03/2007  . BREAST MASS, BENIGN 05/23/2009  . Edema 02/19/2009  . BARRETT'S ESOPHAGUS, HX OF 08/03/2007  . Interstitial cystitis   . Arthritis   . Internal hemorrhoids   . Epistaxis   . Right hand fracture   . Diarrhea   . Bladder disorder   . Myocardial infarction   . Anginal pain   . Dysrhythmia 08/2012    symptomatic bradycardia  . Shortness of breath    Past Surgical History  Procedure Laterality Date  . Breast surgery      benign mass  . Cholecystectomy    . Cesarean section    . Appendectomy    . Cataract extraction, bilateral    . Tonsillectomy    . Abdominal hysterectomy    . Ankle reconstruction      Left  . Wrist reconstruction      Right   Family History  Problem Relation Age of Onset  . Colon cancer Mother   . Coronary artery disease Mother   . Heart disease Mother   . Coronary artery disease Father   . Heart disease Father   . Colon polyps Sister   . Coronary artery disease Sister   . Coronary artery disease Brother   . Diabetes type II Son    History  Substance Use Topics  .  Smoking status: Former Smoker -- 1.00 packs/day for 15 years    Types: Cigarettes    Quit date: 11/10/1969  . Smokeless tobacco: Never Used  . Alcohol Use: No   OB History   Grav Para Term Preterm Abortions TAB SAB Ect Mult Living                 Review of Systems  Cardiovascular: Positive for palpitations.   All other systems reviewed and are negative except as noted in HPI.   Allergies  Codeine and Sulfonamide derivatives  Home Medications   Current Outpatient Rx  Name  Route  Sig  Dispense  Refill  . albuterol (PROVENTIL HFA;VENTOLIN HFA) 108 (90 BASE) MCG/ACT inhaler   Inhalation   Inhale 2 puffs into the lungs every 6 (six) hours as needed for wheezing or shortness of breath.   1 Inhaler   2   . ALPRAZolam (XANAX) 0.25 MG tablet   Oral   Take 1 tablet (0.25 mg total) by mouth 3 (three) times daily as needed for anxiety.   30 tablet   1   . amLODipine-valsartan (EXFORGE) 5-320 MG per tablet   Oral   Take 1 tablet by mouth daily.         [name] aspirin 81 MG chewable tablet   Oral  Chew 81 mg by mouth daily.         . Calcium Carbonate-Vitamin D (CALCIUM 600+D) 600-200 MG-UNIT TABS   Oral   Take 1 tablet by mouth 2 (two) times daily.          . furosemide (LASIX) 40 MG tablet   Oral   Take 40 mg by mouth daily.         . isosorbide mononitrate (IMDUR) 60 MG 24 hr tablet   Oral   Take 1.5 tablets (90 mg total) by mouth daily. Pt request west-wards tabs   135 tablet   2   . lansoprazole (PREVACID) 30 MG capsule   Oral   Take 1 capsule (30 mg total) by mouth 2 (two) times daily.   180 capsule   3   . levothyroxine (SYNTHROID, LEVOTHROID) 50 MCG tablet   Oral   Take 50 mcg by mouth daily.         . Multiple Vitamin (MULTIVITAMIN) tablet   Oral   Take 1 tablet by mouth daily.           . nitroGLYCERIN (NITROSTAT) 0.4 MG SL tablet   Sublingual   Place 0.4 mg under the tongue every 5 (five) minutes as needed for chest pain. If pain after 3  pills- call 911         . pentosan polysulfate (ELMIRON) 100 MG capsule   Oral   Take 100 mg by mouth 3 (three) times daily before meals.           . potassium chloride (K-DUR,KLOR-CON) 10 MEQ tablet   Oral   Take 1 tablet (10 mEq total) by mouth daily.   90 tablet   3   . pravastatin (PRAVACHOL) 40 MG tablet   Oral   Take 40 mg by mouth every morning.          BP 143/95  Pulse 114  Temp(Src) 98.2 F (36.8 C) (Oral)  Resp 19  SpO2 97% Physical Exam  Nursing note and vitals reviewed. Constitutional: She is oriented to person, place, and time. She appears well-developed and well-nourished.  HENT:  Head: Normocephalic and atraumatic.  Eyes: EOM are normal. Pupils are equal, round, and reactive to light.  Neck: Normal range of motion. Neck supple.  Cardiovascular: Normal rate, normal heart sounds and intact distal pulses.  An irregular rhythm present.  Pulmonary/Chest: Effort normal and breath sounds normal.  Abdominal: Bowel sounds are normal. She exhibits no distension. There is no tenderness.  Musculoskeletal: Normal range of motion. She exhibits no edema and no tenderness.  Neurological: She is alert and oriented to person, place, and time. She has normal strength. No cranial nerve deficit or sensory deficit.  Skin: Skin is warm and dry. No rash noted.  Psychiatric: She has a normal mood and affect.    ED Course  Procedures (including critical care time) Labs Review Labs Reviewed  CBC WITH DIFFERENTIAL - Abnormal; Notable for the following:    Neutrophils Relative % 80 (*)    Neutro Abs 8.5 (*)    All other components within normal limits  COMPREHENSIVE METABOLIC PANEL - Abnormal; Notable for the following:    Glucose, Bld 129 (*)    BUN 36 (*)    Creatinine, Ser 1.35 (*)    Albumin 3.4 (*)    Total Bilirubin 0.2 (*)    GFR calc non Af Amer 34 (*)    GFR calc Af Amer 40 (*)    All  other components within normal limits  PRO B NATRIURETIC PEPTIDE -  Abnormal; Notable for the following:    Pro B Natriuretic peptide (BNP) 1425.0 (*)    All other components within normal limits  POCT I-STAT TROPONIN I   Imaging Review Dg Chest 2 View  09/20/2013   CLINICAL DATA:  Palpitations and dizziness with hypertension.  EXAM: CHEST  2 VIEW  COMPARISON:  05/19/2013  FINDINGS: The lungs are adequately inflated with minimal linear scarring over the left base. There is no consolidation or effusion. The cardiomediastinal silhouette and remainder of the exam is unchanged.  IMPRESSION: No acute cardiopulmonary disease.   Electronically Signed   By: Elberta Fortis M.D.   On: 09/20/2013 20:52    EKG Interpretation     Ventricular Rate:  99 PR Interval:    QRS Duration: 130 QT Interval:  324 QTC Calculation: 415 R Axis:   -78 Text Interpretation:  Atrial fibrillation Left axis deviation Left bundle branch block Abnormal ECG Since last tracing Atrial fibrillation NOW PRESENT Left bundle branch block is old            MDM   1. Atrial fibrillation    Pt with new onset afib, rate relatively well controlled now. Plan admission for same. Discussed with Cardiology fellow who will see the patient in the ED.     Sabien Umland B. Bernette Mayers, MD 09/20/13 2151

## 2013-09-20 NOTE — ED Notes (Signed)
Cardiologist at bedside.  

## 2013-09-20 NOTE — ED Notes (Signed)
Pt. Reports intermittent palpitations onset yesterday with slight dizziness and mild headache . Denies chest pain or SOB . Pt. stated history of CAD / Coronary stent /CHF - her cardiologist is Dr. Antoine Poche .

## 2013-09-21 ENCOUNTER — Encounter (HOSPITAL_COMMUNITY): Payer: Self-pay | Admitting: *Deleted

## 2013-09-21 ENCOUNTER — Telehealth: Payer: Self-pay | Admitting: Internal Medicine

## 2013-09-21 DIAGNOSIS — I10 Essential (primary) hypertension: Secondary | ICD-10-CM

## 2013-09-21 DIAGNOSIS — I369 Nonrheumatic tricuspid valve disorder, unspecified: Secondary | ICD-10-CM

## 2013-09-21 DIAGNOSIS — I5030 Unspecified diastolic (congestive) heart failure: Secondary | ICD-10-CM

## 2013-09-21 DIAGNOSIS — I4821 Permanent atrial fibrillation: Secondary | ICD-10-CM | POA: Diagnosis present

## 2013-09-21 DIAGNOSIS — I4891 Unspecified atrial fibrillation: Principal | ICD-10-CM

## 2013-09-21 DIAGNOSIS — I4892 Unspecified atrial flutter: Secondary | ICD-10-CM

## 2013-09-21 LAB — BASIC METABOLIC PANEL
BUN: 30 mg/dL — ABNORMAL HIGH (ref 6–23)
Chloride: 110 mEq/L (ref 96–112)
Creatinine, Ser: 1.01 mg/dL (ref 0.50–1.10)
GFR calc non Af Amer: 49 mL/min — ABNORMAL LOW (ref >90)
Glucose, Bld: 145 mg/dL — ABNORMAL HIGH (ref 70–99)
Potassium: 4.3 mEq/L (ref 3.5–5.1)

## 2013-09-21 MED ORDER — DILTIAZEM HCL ER COATED BEADS 240 MG PO CP24
240.0000 mg | ORAL_CAPSULE | Freq: Every day | ORAL | Status: DC
  Administered 2013-09-21 – 2013-09-22 (×2): 240 mg via ORAL
  Filled 2013-09-21 (×2): qty 1

## 2013-09-21 MED ORDER — SODIUM CHLORIDE 0.9 % IJ SOLN
3.0000 mL | Freq: Two times a day (BID) | INTRAMUSCULAR | Status: DC
  Administered 2013-09-21 – 2013-09-22 (×4): 3 mL via INTRAVENOUS

## 2013-09-21 MED ORDER — ZOLPIDEM TARTRATE 5 MG PO TABS
5.0000 mg | ORAL_TABLET | Freq: Every evening | ORAL | Status: DC | PRN
  Administered 2013-09-21 (×2): 5 mg via ORAL
  Filled 2013-09-21 (×2): qty 1

## 2013-09-21 MED ORDER — NITROGLYCERIN 0.4 MG SL SUBL: 0.4000 mg | SUBLINGUAL_TABLET | SUBLINGUAL | Status: DC | PRN

## 2013-09-21 MED ORDER — APIXABAN 5 MG PO TABS
5.0000 mg | ORAL_TABLET | Freq: Two times a day (BID) | ORAL | Status: DC
  Administered 2013-09-21 – 2013-09-22 (×3): 5 mg via ORAL
  Filled 2013-09-21 (×4): qty 1

## 2013-09-21 MED ORDER — LEVOTHYROXINE SODIUM 50 MCG PO TABS
50.0000 ug | ORAL_TABLET | Freq: Every day | ORAL | Status: DC
  Administered 2013-09-22: 06:00:00 50 ug via ORAL
  Filled 2013-09-21 (×2): qty 1

## 2013-09-21 MED ORDER — ATORVASTATIN CALCIUM 10 MG PO TABS
10.0000 mg | ORAL_TABLET | Freq: Every day | ORAL | Status: DC
  Administered 2013-09-21: 18:00:00 10 mg via ORAL
  Filled 2013-09-21 (×2): qty 1

## 2013-09-21 MED ORDER — AMLODIPINE BESYLATE 5 MG PO TABS
5.0000 mg | ORAL_TABLET | Freq: Every day | ORAL | Status: DC
  Filled 2013-09-21: qty 1

## 2013-09-21 MED ORDER — ONE-DAILY MULTI VITAMINS PO TABS: 1.0000 | ORAL_TABLET | Freq: Every day | ORAL | Status: DC

## 2013-09-21 MED ORDER — PENTOSAN POLYSULFATE SODIUM 100 MG PO CAPS
100.0000 mg | ORAL_CAPSULE | Freq: Three times a day (TID) | ORAL | Status: DC
  Administered 2013-09-21 – 2013-09-22 (×4): 100 mg via ORAL
  Filled 2013-09-21 (×6): qty 1

## 2013-09-21 MED ORDER — PANTOPRAZOLE SODIUM 40 MG PO TBEC
40.0000 mg | DELAYED_RELEASE_TABLET | Freq: Every day | ORAL | Status: DC
  Administered 2013-09-21 – 2013-09-22 (×2): 40 mg via ORAL
  Filled 2013-09-21 (×2): qty 1

## 2013-09-21 MED ORDER — IRBESARTAN 300 MG PO TABS
300.0000 mg | ORAL_TABLET | Freq: Every day | ORAL | Status: DC
  Administered 2013-09-21 – 2013-09-22 (×2): 300 mg via ORAL
  Filled 2013-09-21 (×2): qty 1

## 2013-09-21 MED ORDER — SODIUM CHLORIDE 0.9 % IV SOLN: 250.0000 mL | INTRAVENOUS | Status: DC | PRN

## 2013-09-21 MED ORDER — FUROSEMIDE 10 MG/ML IJ SOLN
40.0000 mg | Freq: Once | INTRAMUSCULAR | Status: AC
  Administered 2013-09-21: 40 mg via INTRAVENOUS

## 2013-09-21 MED ORDER — AMLODIPINE BESYLATE-VALSARTAN 5-320 MG PO TABS: 1.0000 | ORAL_TABLET | Freq: Every day | ORAL | Status: DC

## 2013-09-21 MED ORDER — ALPRAZOLAM 0.25 MG PO TABS: 0.2500 mg | ORAL_TABLET | Freq: Three times a day (TID) | ORAL | Status: DC | PRN

## 2013-09-21 MED ORDER — SODIUM CHLORIDE 0.9 % IJ SOLN: 3.0000 mL | INTRAMUSCULAR | Status: DC | PRN

## 2013-09-21 MED ORDER — ONDANSETRON HCL 4 MG/2ML IJ SOLN: 4.0000 mg | Freq: Four times a day (QID) | INTRAMUSCULAR | Status: DC | PRN

## 2013-09-21 MED ORDER — ACETAMINOPHEN 325 MG PO TABS: 650.0000 mg | ORAL_TABLET | ORAL | Status: DC | PRN

## 2013-09-21 MED ORDER — ADULT MULTIVITAMIN W/MINERALS CH
1.0000 | ORAL_TABLET | Freq: Every day | ORAL | Status: DC
  Administered 2013-09-21 – 2013-09-22 (×2): 1 via ORAL
  Filled 2013-09-21 (×2): qty 1

## 2013-09-21 MED ORDER — SIMVASTATIN 20 MG PO TABS
20.0000 mg | ORAL_TABLET | Freq: Every day | ORAL | Status: DC
  Filled 2013-09-21: qty 1

## 2013-09-21 MED ORDER — ISOSORBIDE MONONITRATE ER 30 MG PO TB24
90.0000 mg | ORAL_TABLET | Freq: Every day | ORAL | Status: DC
  Administered 2013-09-21 – 2013-09-22 (×2): 90 mg via ORAL
  Filled 2013-09-21 (×2): qty 1

## 2013-09-21 NOTE — H&P (Signed)
Cardiology History and Physical  Carrie Mew, MD  History of Present Illness (and review of medical records): Victoria Gomez is a 79 y.o. female who presents for evaluation of rapid heart rate.  She has hx of CAD, diastolic heart failure, and HTN.  She had been checking her BP and HR on home monitor for past two days and noticed her HR in 120s.  She normally monitors her HR due to prior symptomatic bradycardia, however, she has been without batteries for her device and thus has not checked it for at least over a month or more.  She called her PCP's office who directed her to the ED.  She denies any palpitations, shortness of breath or chest pain, PND, orthopnea.  She has no changes to her LE swelling.  She has felt more fatigue than usually and occasional dizziness.  She denies any syncope.  Previous diagnostic testing: Echo 08/2011 Study Conclusions - Left ventricle: The cavity size was normal. Wall thickness was increased in a pattern of mild LVH. Systolic function was normal. The estimated ejection fraction was in the range of 55% to 60%. Features are consistent with a pseudonormal left ventricular filling pattern, with concomitant abnormal relaxation and increased filling pressure (grade 2 diastolic dysfunction). Doppler parameters are consistent with high ventricular filling pressure. - Aortic valve: Trivial regurgitation. - Mitral valve: Calcified annulus. - Left atrium: The atrium was mildly to moderately dilated. - Right ventricle: The cavity size was normal. Wall thickness was mildly increased.  Review of Systems Further review of systems was otherwise negative other than stated in HPI.  Patient Active Problem List   Diagnosis Date Noted  . Acute bronchitis 05/20/2013  . Cough 04/26/2013  . Chest pain 04/26/2013  . Chronic diastolic CHF (congestive heart failure) 04/26/2013  . Hypokalemia 04/26/2013  . Syncope 08/30/2012  . Fatigue 04/20/2012  . Diastolic HF  (heart failure) 11/17/2011  . ARTHRITIS 12/30/2010  . Left lumbar radiculopathy 12/11/2010  . Interstitial cystitis   . History of breast lump/mass excision 05/23/2009  . Edema 02/19/2009  . HYPERLIPIDEMIA 03/07/2008  . Esophageal reflux 10/18/2007  . HYPOTHYROIDISM 08/03/2007  . HYPERTENSION 08/03/2007  . CORONARY ARTERY DISEASE 08/03/2007  . DIVERTICULOSIS, COLON 08/03/2007  . ADVEF, DRUG/MEDICINAL/BIOLOGICAL SUBST NOS 08/03/2007  . BARRETT'S ESOPHAGUS, HX OF 08/03/2007   Past Medical History  Diagnosis Date  . HYPERLIPIDEMIA 03/07/2008  . HYPERTENSION 08/03/2007  . CORONARY ARTERY DISEASE 08/03/2007  . Acute on chronic systolic heart failure 05/23/2009  . HYPOTHYROIDISM 08/03/2007  . Esophageal reflux 10/18/2007  . DIVERTICULOSIS, COLON 08/03/2007  . DISORDER, BLADDER NEC 08/03/2007  . BREAST MASS, BENIGN 05/23/2009  . Edema 02/19/2009  . BARRETT'S ESOPHAGUS, HX OF 08/03/2007  . Interstitial cystitis   . Arthritis   . Internal hemorrhoids   . Epistaxis   . Right hand fracture   . Diarrhea   . Bladder disorder   . Myocardial infarction   . Anginal pain   . Dysrhythmia 08/2012    symptomatic bradycardia  . Shortness of breath     Past Surgical History  Procedure Laterality Date  . Breast surgery      benign mass  . Cholecystectomy    . Cesarean section    . Appendectomy    . Cataract extraction, bilateral    . Tonsillectomy    . Abdominal hysterectomy    . Ankle reconstruction      Left  . Wrist reconstruction      Right    Prescriptions  prior to admission  Medication Sig Dispense Refill  . albuterol (PROVENTIL HFA;VENTOLIN HFA) 108 (90 BASE) MCG/ACT inhaler Inhale 2 puffs into the lungs every 6 (six) hours as needed for wheezing or shortness of breath.  1 Inhaler  2  . ALPRAZolam (XANAX) 0.25 MG tablet Take 1 tablet (0.25 mg total) by mouth 3 (three) times daily as needed for anxiety.  30 tablet  1  . amLODipine-valsartan (EXFORGE) 5-320 MG per tablet Take 1 tablet  by mouth daily.      [name] aspirin 81 MG chewable tablet Chew 81 mg by mouth daily.      . Calcium Carbonate-Vitamin D (CALCIUM 600+D) 600-200 MG-UNIT TABS Take 1 tablet by mouth 2 (two) times daily.       . furosemide (LASIX) 40 MG tablet Take 40 mg by mouth daily.      . isosorbide mononitrate (IMDUR) 60 MG 24 hr tablet Take 1.5 tablets (90 mg total) by mouth daily. Pt request west-wards tabs  135 tablet  2  . lansoprazole (PREVACID) 30 MG capsule Take 1 capsule (30 mg total) by mouth 2 (two) times daily.  180 capsule  3  . levothyroxine (SYNTHROID, LEVOTHROID) 50 MCG tablet Take 50 mcg by mouth daily.      . Multiple Vitamin (MULTIVITAMIN) tablet Take 1 tablet by mouth daily.        . nitroGLYCERIN (NITROSTAT) 0.4 MG SL tablet Place 0.4 mg under the tongue every 5 (five) minutes as needed for chest pain. If pain after 3 pills- call 911      . pentosan polysulfate (ELMIRON) 100 MG capsule Take 100 mg by mouth 3 (three) times daily before meals.        . potassium chloride (K-DUR,KLOR-CON) 10 MEQ tablet Take 1 tablet (10 mEq total) by mouth daily.  90 tablet  3  . pravastatin (PRAVACHOL) 40 MG tablet Take 40 mg by mouth every morning.       Allergies  Allergen Reactions  . Codeine Hives and Nausea And Vomiting  . Sulfonamide Derivatives Swelling    Turns red    History  Substance Use Topics  . Smoking status: Former Smoker -- 1.00 packs/day for 15 years    Types: Cigarettes    Quit date: 11/10/1969  . Smokeless tobacco: Never Used  . Alcohol Use: No    Family History  Problem Relation Age of Onset  . Colon cancer Mother   . Coronary artery disease Mother   . Heart disease Mother   . Coronary artery disease Father   . Heart disease Father   . Colon polyps Sister   . Coronary artery disease Sister   . Coronary artery disease Brother   . Diabetes type II Son      Objective:  Patient Vitals for the past 8 hrs:  BP Temp Temp src Pulse Resp SpO2 Height Weight  09/21/13 0013 - - - -  - - 5\' 3"  (1.6 m) 68.1 kg (150 lb 2.1 oz)  09/20/13 2315 131/81 mmHg - - 89 18 98 % - -  09/20/13 2230 104/58 mmHg - - 91 17 99 % - -  09/20/13 2225 107/72 mmHg - - 92 18 99 % - -  09/20/13 2100 117/64 mmHg - - 89 19 96 % - -  09/20/13 1948 143/95 mmHg 98.2 F (36.8 C) Oral 114 19 97 % - -   General appearance: alert, cooperative, appears stated age and no distress Head: Normocephalic, without obvious abnormality, atraumatic Eyes:  conjunctivae/corneas clear. PERRL, EOM's intact. Fundi benign. Neck: no carotid bruit, no JVD and supple, symmetrical, trachea midline Lungs: clear to auscultation bilaterally Chest wall: no tenderness Heart: irregular rate and rhythm, S1, S2 normal,  Abdomen: soft, non-tender; bowel sounds normal; no masses,  no organomegaly Extremities: extremities normal, atraumatic, BLE edema Pulses: 2+ and symmetric Neurologic: Grossly normal  Results for orders placed during the hospital encounter of 09/20/13 (from the past 48 hour(s))  CBC WITH DIFFERENTIAL     Status: Abnormal   Collection Time    09/20/13  7:53 PM      Result Value Range   WBC 10.5  4.0 - 10.5 K/uL   RBC 4.38  3.87 - 5.11 MIL/uL   Hemoglobin 12.5  12.0 - 15.0 g/dL   HCT 29.5  62.1 - 30.8 %   MCV 83.8  78.0 - 100.0 fL   MCH 28.5  26.0 - 34.0 pg   MCHC 34.1  30.0 - 36.0 g/dL   RDW 65.7  84.6 - 96.2 %   Platelets 223  150 - 400 K/uL   Neutrophils Relative % 80 (*) 43 - 77 %   Neutro Abs 8.5 (*) 1.7 - 7.7 K/uL   Lymphocytes Relative 13  12 - 46 %   Lymphs Abs 1.4  0.7 - 4.0 K/uL   Monocytes Relative 6  3 - 12 %   Monocytes Absolute 0.7  0.1 - 1.0 K/uL   Eosinophils Relative 0  0 - 5 %   Eosinophils Absolute 0.0  0.0 - 0.7 K/uL   Basophils Relative 0  0 - 1 %   Basophils Absolute 0.0  0.0 - 0.1 K/uL  COMPREHENSIVE METABOLIC PANEL     Status: Abnormal   Collection Time    09/20/13  7:53 PM      Result Value Range   Sodium 139  135 - 145 mEq/L   Potassium 4.3  3.5 - 5.1 mEq/L   Chloride  107  96 - 112 mEq/L   CO2 22  19 - 32 mEq/L   Glucose, Bld 129 (*) 70 - 99 mg/dL   BUN 36 (*) 6 - 23 mg/dL   Creatinine, Ser 9.52 (*) 0.50 - 1.10 mg/dL   Calcium 9.0  8.4 - 84.1 mg/dL   Total Protein 6.6  6.0 - 8.3 g/dL   Albumin 3.4 (*) 3.5 - 5.2 g/dL   AST 20  0 - 37 U/L   ALT 22  0 - 35 U/L   Alkaline Phosphatase 87  39 - 117 U/L   Total Bilirubin 0.2 (*) 0.3 - 1.2 mg/dL   GFR calc non Af Amer 34 (*) >90 mL/min   GFR calc Af Amer 40 (*) >90 mL/min   Comment: (NOTE)     The eGFR has been calculated using the CKD EPI equation.     This calculation has not been validated in all clinical situations.     eGFR's persistently <90 mL/min signify possible Chronic Kidney     Disease.  PRO B NATRIURETIC PEPTIDE     Status: Abnormal   Collection Time    09/20/13  7:54 PM      Result Value Range   Pro B Natriuretic peptide (BNP) 1425.0 (*) 0 - 450 pg/mL  POCT I-STAT TROPONIN I     Status: None   Collection Time    09/20/13  8:10 PM      Result Value Range   Troponin i, poc 0.01  0.00 -  0.08 ng/mL   Comment 3            Comment: Due to the release kinetics of cTnI,     a negative result within the first hours     of the onset of symptoms does not rule out     myocardial infarction with certainty.     If myocardial infarction is still suspected,     repeat the test at appropriate intervals.  URINALYSIS, ROUTINE W REFLEX MICROSCOPIC     Status: None   Collection Time    09/20/13 11:29 PM      Result Value Range   Color, Urine YELLOW  YELLOW   APPearance CLEAR  CLEAR   Specific Gravity, Urine 1.026  1.005 - 1.030   pH 5.0  5.0 - 8.0   Glucose, UA NEGATIVE  NEGATIVE mg/dL   Hgb urine dipstick NEGATIVE  NEGATIVE   Bilirubin Urine NEGATIVE  NEGATIVE   Ketones, ur NEGATIVE  NEGATIVE mg/dL   Protein, ur NEGATIVE  NEGATIVE mg/dL   Urobilinogen, UA 0.2  0.0 - 1.0 mg/dL   Nitrite NEGATIVE  NEGATIVE   Leukocytes, UA NEGATIVE  NEGATIVE   Comment: MICROSCOPIC NOT DONE ON URINES WITH  NEGATIVE PROTEIN, BLOOD, LEUKOCYTES, NITRITE, OR GLUCOSE <1000 mg/dL.   Dg Chest 2 View  09/20/2013   CLINICAL DATA:  Palpitations and dizziness with hypertension.  EXAM: CHEST  2 VIEW  COMPARISON:  05/19/2013  FINDINGS: The lungs are adequately inflated with minimal linear scarring over the left base. There is no consolidation or effusion. The cardiomediastinal silhouette and remainder of the exam is unchanged.  IMPRESSION: No acute cardiopulmonary disease.   Electronically Signed   By: Elberta Fortis M.D.   On: 09/20/2013 20:52    ECG:  Suspect atrial flutter with variable AV block, LBBB Hr 99.  Prior sinus rhythm with sinus arrythmia, LAD, LBBB.  Assessment: Atrial flutter Hx of CAD Diastolic HF HTN  Plan:  1. Cardiology Admission  2. Continuous monitoring on Telemetry. 3. Repeat ekg on admit, prn chest pain or arrythmia 4. Rate is controlled at this time, will hold meds unless needed given hx of symptomatic bradycardia 5. CHADsVASc 5, Will anticoagulate with Heparin gtt. 6. Will likely need TEE to r/o clot prior DCCV. 7. TTE in am. 8. Continue home meds.

## 2013-09-21 NOTE — Telephone Encounter (Signed)
[facility] Triage Call Report Triage Record Num: [id number] Operator: Aundra Millet Patient Name: Victoria Gomez Call Date & Time: 09/20/2013 5:02:03PM Patient Phone: [phone] PCP: Darryll Capers Patient Gender: Female PCP Fax : [phone] Patient DOB: 06-13-1925 [facility] Name[facility] Reason for Call: Caller: Tracia/Patient; PCP: Darryll Capers (Adults only); CB#: [phone]; Call regarding Rapid heart rate, 101 @ headache; Today, 09/20/2013, pt calling with c/o of fast heart rate since 09/19/2013. This morning upon waking Pulse 123. BP 138/79 Pulse 110 at 1700 today after a nap - Pulse is usually 60's. Pt does have history of CHF. RN reached See ED Immediately for Known heart failure and new onset palpitations per Irregular Heartbeat protocol . RN advised to proceed to [facility] ED now for evaluation. Protocol(s) Used: Irregular Heartbeat Recommended Outcome per Protocol: See ED Immediately Reason for Outcome: Known heart failure (HF) and new onset of palpitations or irregular pulse Care Advice: ~ Another adult should drive. ~ IMMEDIATE ACTION ~ Place person in a position of comfort and loosen tight clothing. Call EMS 911 if having chest pain, sudden severe shortness of breath, loss of consciousness, or signs of shock (such as unable to stand due to faintness, dizziness, or lightheadedness; new onset of confusion; slow to respond or difficult to awaken; skin is pale, gray, cool, or moist to touch; severe weakness). ~ 11/

## 2013-09-21 NOTE — Progress Notes (Signed)
Physical Therapy Evaluation Patient Details Name: Victoria Gomez MRN: [id number] DOB: 13-Dec-1924 Today's Date: 09/21/2013 Time: 0272-5366 PT Time Calculation (min): 23 min  PT Assessment / Plan / Recommendation History of Present Illness  Pt admit with afib.  Clinical Impression  Pt admitted with above. Pt currently with functional limitations due to the deficits listed below (see PT Problem List). Should progress well and not need f/u.  Has equipment if needed.   Pt will benefit from skilled PT to increase their independence and safety with mobility to allow discharge to the venue listed below.     PT Assessment  Patient needs continued PT services    Follow Up Recommendations  No PT follow up;Supervision - Intermittent                Equipment Recommendations  None recommended by PT         Frequency Min 2X/week    Precautions / Restrictions Precautions Precautions: None Restrictions Weight Bearing Restrictions: No   Pertinent Vitals/Pain HR 65-85 bpm, no pain      Mobility  Bed Mobility Bed Mobility: Rolling Right;Right Sidelying to Sit;Sitting - Scoot to Edge of Bed Rolling Right: 7: Independent Right Sidelying to Sit: 7: Independent Sitting - Scoot to Edge of Bed: 7: Independent Transfers Transfers: Sit to Stand;Stand to Sit Sit to Stand: 7: Independent Stand to Sit: 7: Independent Ambulation/Gait Ambulation/Gait Assistance: 4: Min guard Ambulation Distance (Feet): 150 Feet Assistive device: None Ambulation/Gait Assistance Details: Pt with no significant LOB however was occasionally unsteady but self corrected.   Gait Pattern: Step-to pattern;Decreased stride length;Decreased step length - left Gait velocity: decreased Stairs: No Wheelchair Mobility Wheelchair Mobility: No         PT Diagnosis: Generalized weakness  PT Problem List: Decreased mobility;Decreased activity tolerance;Decreased balance;Decreased knowledge of use of DME PT Treatment  Interventions: Gait training;DME instruction;Functional mobility training;Therapeutic exercise;Therapeutic activities;Patient/family education     PT Goals(Current goals can be found in the care plan section) Acute Rehab PT Goals Patient Stated Goal: to go home PT Goal Formulation: With patient Time For Goal Achievement: 09/28/13 Potential to Achieve Goals: Good  Visit Information  Last PT Received On: 09/21/13 Assistance Needed: +1 History of Present Illness: Pt admit with afib.       Prior Functioning  Home Living Family/patient expects to be discharged to:: Private residence Living Arrangements: Children Available Help at Discharge: Family;Available 24 hours/day Type of Home: House Home Access: Stairs to enter [employer] of Steps: 2 Entrance Stairs-Rails: Can reach both Home Layout: One level Home Equipment: Walker - 2 wheels;Cane - single point;Bedside commode;Shower seat Prior Function Level of Independence: Independent Communication Communication: No difficulties    Cognition  Cognition Arousal/Alertness: Awake/alert Behavior During Therapy: WFL for tasks assessed/performed Overall Cognitive Status: Within Functional Limits for tasks assessed    Extremity/Trunk Assessment Upper Extremity Assessment Upper Extremity Assessment: Defer to OT evaluation Lower Extremity Assessment Lower Extremity Assessment: Generalized weakness Cervical / Trunk Assessment Cervical / Trunk Assessment: Normal   Balance Balance Balance Assessed: Yes Static Standing Balance Static Standing - Balance Support: No upper extremity supported;During functional activity Static Standing - Level of Assistance: 5: Stand by assistance Static Standing - Comment/# of Minutes: 2 High Level Balance High Level Balance Activites: Sudden stops;Head turns;Direction changes;Turns High Level Balance Comments: All of above without significant LOB.    End of Session PT - End of  Session Equipment Utilized During Treatment: Gait belt Activity Tolerance: Patient limited by fatigue Patient  left: in bed;with call bell/phone within reach;with family/visitor present Nurse Communication: Mobility status       Victoria Gomez 09/21/2013, 4:41 PM [facility] Acute Rehabilitation [phone] (pager)

## 2013-09-21 NOTE — Progress Notes (Signed)
  Echocardiogram 2D Echocardiogram has been performed.  Victoria Gomez 09/21/2013, 6:13 PM

## 2013-09-21 NOTE — Progress Notes (Signed)
ANTICOAGULATION CONSULT NOTE - Initial Consult  Pharmacy Consult for Apixaban  Indication: atrial fibrillation  Allergies  Allergen Reactions  . Codeine Hives and Nausea And Vomiting  . Sulfonamide Derivatives Swelling    Turns red    Patient Measurements: Height: 5\' 3"  (160 cm) Weight: 149 lb 8 oz (67.813 kg) (Scale b) IBW/kg (Calculated) : 52.4  Vital Signs: Temp: 98 F (36.7 C) (11/12 0528) Temp src: Oral (11/12 0528) BP: 116/54 mmHg (11/12 0528) Pulse Rate: 94 (11/12 0528)  Labs:  Recent Labs  09/20/13 1953 09/21/13 0559  HGB 12.5  --   HCT 36.7  --   PLT 223  --   CREATININE 1.35* 1.01    Estimated Creatinine Clearance: 37 ml/min (by C-G formula based on Cr of 1.01).   Medical History: Past Medical History  Diagnosis Date  . HYPERLIPIDEMIA 03/07/2008  . HYPERTENSION 08/03/2007  . CORONARY ARTERY DISEASE 08/03/2007  . Acute on chronic systolic heart failure 05/23/2009  . HYPOTHYROIDISM 08/03/2007  . Esophageal reflux 10/18/2007  . DIVERTICULOSIS, COLON 08/03/2007  . DISORDER, BLADDER NEC 08/03/2007  . BREAST MASS, BENIGN 05/23/2009  . Edema 02/19/2009  . BARRETT'S ESOPHAGUS, HX OF 08/03/2007  . Interstitial cystitis   . Arthritis   . Internal hemorrhoids   . Epistaxis   . Right hand fracture   . Diarrhea   . Bladder disorder   . Myocardial infarction   . Anginal pain   . Dysrhythmia 08/2012    symptomatic bradycardia  . Shortness of breath    Assessment: 79 y/o F to start apixaban per pharmacy for afib. Labs as above.   Goal of Therapy:  Monitor platelets by anticoagulation protocol: Yes   Plan:  -Apixaban 5mg  BID (already ordered by MD) -q72h CBC -Monitor for bleeding  Thank you for allowing me to take part in this patient's care,  Abran Duke, PharmD Clinical Pharmacist Phone: [phone] Pager: [phone] 09/21/2013 7:26 AM

## 2013-09-21 NOTE — Progress Notes (Addendum)
PROGRESS NOTE  Subjective:    Victoria Gomez is a 79 y.o. female who presents for evaluation of rapid heart rate. She has hx of CAD, diastolic heart failure, and HTN. She had been checking her BP and HR on home monitor for past two days and noticed her HR in 120s. She normally monitors her HR due to prior symptomatic bradycardia, however, she has been without batteries for her device and thus has not checked it for at least over a month or more. She called her PCP's office who directed her to the ED. She denies any palpitations, shortness of breath or chest pain, PND, orthopnea. She has no changes to her LE swelling. She has felt more fatigue than usually and occasional dizziness. She denies any syncope.    Objective:    Vital Signs:   Temp:  [98 F (36.7 C)-98.2 F (36.8 C)] 98 F (36.7 C) (11/12 0528) Pulse Rate:  [89-114] 94 (11/12 0528) Resp:  [17-19] 18 (11/12 0528) BP: (104-143)/(54-95) 116/54 mmHg (11/12 0528) SpO2:  [96 %-99 %] 98 % (11/12 0528) Weight:  [149 lb 8 oz (67.813 kg)-150 lb 2.1 oz (68.1 kg)] 149 lb 8 oz (67.813 kg) (11/12 0528)  Last BM Date: 09/20/13   24-hour weight change: Weight change:   Weight trends: Filed Weights   09/21/13 0013 09/21/13 0528  Weight: 150 lb 2.1 oz (68.1 kg) 149 lb 8 oz (67.813 kg)    Intake/Output:        Physical Exam: BP 116/54  Pulse 94  Temp(Src) 98 F (36.7 C) (Oral)  Resp 18  Ht 5\' 3"  (1.6 m)  Wt 149 lb 8 oz (67.813 kg)  BMI 26.49 kg/m2  SpO2 98%  General: Vital signs reviewed and noted.   Head: Normocephalic, atraumatic.  Eyes: conjunctivae/corneas clear.  EOM's intact.   Throat: normal  Neck:  normal  Lungs:    clear  Heart:  irreg. Irreg  Abdomen:  Soft, non-tender, non-distended    Extremities: No edema   Neurologic: A&O X3, CN II - XII are grossly intact.   Psych: Normal     Labs: BMET:  Recent Labs  09/20/13 1953  NA 139  K 4.3  CL 107  CO2 22  GLUCOSE 129*  BUN 36*  CREATININE  1.35*  CALCIUM 9.0    Liver function tests:  Recent Labs  09/20/13 1953  AST 20  ALT 22  ALKPHOS 87  BILITOT 0.2*  PROT 6.6  ALBUMIN 3.4*   No results found for this basename: LIPASE, AMYLASE,  in the last 72 hours  CBC:  Recent Labs  09/20/13 1953  WBC 10.5  NEUTROABS 8.5*  HGB 12.5  HCT 36.7  MCV 83.8  PLT 223    Cardiac Enzymes: No results found for this basename: CKTOTAL, CKMB, TROPONINI,  in the last 72 hours  Coagulation Studies: No results found for this basename: LABPROT, INR,  in the last 72 hours  Other: No components found with this basename: POCBNP,  No results found for this basename: DDIMER,  in the last 72 hours No results found for this basename: HGBA1C,  in the last 72 hours No results found for this basename: CHOL, HDL, LDLCALC, TRIG, CHOLHDL,  in the last 72 hours No results found for this basename: TSH, T4TOTAL, FREET3, T3FREE, THYROIDAB,  in the last 72 hours No results found for this basename: VITAMINB12, FOLATE, FERRITIN, TIBC, IRON, RETICCTPCT,  in the last 72 hours  Other results:  EKG: A-Fib with RVR  Tele this am A-fib with controlled V R   Medications:    Infusions:    Scheduled Medications: . sodium chloride  3 mL Intravenous Q12H    Assessment/ Plan:    1. Atrial fib:  Her HR is better.  Will start her on Eliquis 5 BID Echo today She is basically asymptomatic - perhaps some additional fatigue but not CP or dyspnea.  She is not volume overloaded by my exam.  BNP was elevated and she received 1 dose of IV lasix.     Continue her home dose of po lasix   TSH is pending. - she is on Synthroid.  Will DC norvasc and start Diltiazem 240 a day   2. HTn  - BP is ok.  Will start Dilt and DC norvasc for better rate control.   Disposition:  Length of Stay: 1  Vesta Mixer, Montez Hageman., MD, [facility] 09/21/2013, 7:08 AM Office [phone] Pager [phone]

## 2013-09-22 ENCOUNTER — Telehealth: Payer: Self-pay | Admitting: Nurse Practitioner

## 2013-09-22 ENCOUNTER — Encounter (HOSPITAL_COMMUNITY): Payer: Self-pay | Admitting: Physician Assistant

## 2013-09-22 DIAGNOSIS — E1129 Type 2 diabetes mellitus with other diabetic kidney complication: Secondary | ICD-10-CM

## 2013-09-22 DIAGNOSIS — K648 Other hemorrhoids: Secondary | ICD-10-CM | POA: Insufficient documentation

## 2013-09-22 DIAGNOSIS — E118 Type 2 diabetes mellitus with unspecified complications: Secondary | ICD-10-CM

## 2013-09-22 DIAGNOSIS — I4891 Unspecified atrial fibrillation: Secondary | ICD-10-CM | POA: Diagnosis not present

## 2013-09-22 DIAGNOSIS — I447 Left bundle-branch block, unspecified: Secondary | ICD-10-CM | POA: Diagnosis present

## 2013-09-22 DIAGNOSIS — N329 Bladder disorder, unspecified: Secondary | ICD-10-CM | POA: Insufficient documentation

## 2013-09-22 LAB — BASIC METABOLIC PANEL
BUN: 32 mg/dL — ABNORMAL HIGH (ref 6–23)
CO2: 22 mEq/L (ref 19–32)
Chloride: 108 mEq/L (ref 96–112)
Glucose, Bld: 136 mg/dL — ABNORMAL HIGH (ref 70–99)
Potassium: 4.5 mEq/L (ref 3.5–5.1)
Sodium: 141 mEq/L (ref 135–145)

## 2013-09-22 MED ORDER — DILTIAZEM HCL ER COATED BEADS 120 MG PO CP24: 120.0000 mg | ORAL_CAPSULE | Freq: Every day | ORAL | Status: DC

## 2013-09-22 MED ORDER — APIXABAN 5 MG PO TABS: 5.0000 mg | ORAL_TABLET | Freq: Two times a day (BID) | ORAL | Status: DC

## 2013-09-22 MED ORDER — VALSARTAN 320 MG PO TABS: 320.0000 mg | ORAL_TABLET | Freq: Every day | ORAL | Status: DC

## 2013-09-22 NOTE — Discharge Summary (Signed)
Discharge Summary   Patient ID: Victoria Gomez MRN: [id number], DOB/AGE: Nov 08, 1925 79 y.o. Admit date: 09/20/2013 D/C date:     09/22/2013  Primary Cardiologist: Rolanda Campa  Primary Discharge Diagnoses:  1. Newly diagnosed atrial fibrillation - rate control for now with possible DCCV in 3 weeks 2. CAD, stable - prior history PCI to RCA '01, repeat PCI to RCA '03 2/2 ISR, last cath 2012 for med rx 3. Acute on chronic combined systolic and diastolic CHF - EF 40-45% by echo this admission 4. HTN 5. Prior history of symptomatic bradycardia 08/2012 6. CKD stage III 7. Hyperglycemia  Secondary Discharge Diagnoses:  1. HLD 2. Hypothyroidism 3. Esophageal reflux  4. Diverticulosis, colon 5. Breast mass, benign 6. Barrett's esophagus 7. Interstitial cystitis 8. Arthritis 9. Internal hemorrhoids 10. Epistaxis 11. R hand fracture 12. Bladder disorder 13. Orthostatic hypotension 08/2012  Hospital Course: Victoria Gomez is a 79 y/o F with history of CAD s/p PCI, chronic combined systolic/diastolic CHF, HTN, prior symptomatic bradycardia/orthostasis 08/2012 (BB discontinued/antihypertensives decreased) who presented to [facility]20[facility]2013 with elevated HR. She had been checking her HR and BP on home monitor for past two days and noticed her HR in 120s. She normally monitors her HR due to prior symptomatic bradycardia, however, she has been without batteries for her device and thus had not checked it for at least over a month or more. She called her PCP's office who directed her to the ER. She denied any palpitations, shortness of breath or chest pain, PND, orthopnea but did endorse fatigue and occasional dizziness. EKG showed suspected atrial fibrillation with variable AV block, LBBB HR 99. She denied any changes to her LEE. She was admitted to the hospital for further evaluation. She was placed on heparin drip and eventually changed to Apixaban 5mg  BID given high CHADSVASC score and  stroke risk. She received a dose of IV Lasix as BNP was elevated and she had mild SOB (a/c s/d CHF). The Norvasc component of her Exforge was discontinued and diltiazem was started. 2D echo showed EF 40-45%, grade 3 diastolic dysfunction, mitral valve abnormality (see below), mildly dilated LA. At this time she is felt to be euvolemic. Dr. Antoine Poche believes the mitral valve abnormality is likely a calcified leaflet and is not inclined to proceed with TEE at this time. The plan currently is for DCCV in 3 weeks after she has been on anticoagulation.  TSH was normal and troponin was negative. Cr remained mildly fluctuated this admission between 1.0-1.3 but in general her labwork reflects CKD stage III. At discharge we decided to back down on her diltiazem dose due to prior history of symptomatic bradycardia in case she converts back to sinus brady (as well as h/o orthostatic hypotension). Although EF is slightly decreased, she demonstrated good rate control with diltiazem so we have elected to continue CCB for now. She was instructed to monitor HR at home and call if she has any high or low readings. She was instructed to f/u PCP for monitoring of blood sugar which was borderline elevated. Dr. Antoine Poche has seen and examined the patient today and feels she is stable for discharge.   We will plug her into the anticoagulation clinic to make sure she is followed closely with regard to apixaban dosing (meets criteria for 5mg  BID currently, but if she loses weight or Cr goes up, will need to decrease dose). We have discontinued aspirin given bleeding risk. Of note, Epic cued up an "alert" when prescribing  apixaban due to concurrent Elmiron which apparently can exert a weak anticoagulant activity. She tolerated this combination in the hospital, has no prior history of significant bleeding, and has normal CBC. Also discussed with pharmacy. The patient was instructed to monitor closely at home for bleeding and to call her  doctor if she notices any. We have also asked her to discuss with the doctor who prescribed Elmiron to find out if there is an alternative she can use. If the decision is made to discontinue apixaban after 3-week DCCV, will need to cancel the [facility] visit on 10/20/13.   Discharge Vitals: Blood pressure 126/60, pulse 77, temperature 98.5 F (36.9 C), temperature source Oral, resp. rate 18, height 5\' 3"  (1.6 m), weight 146 lb 14.4 oz (66.633 kg), SpO2 98.00%.  Labs: Lab Results  Component Value Date   WBC 10.5 09/20/2013   HGB 12.5 09/20/2013   HCT 36.7 09/20/2013   MCV 83.8 09/20/2013   PLT 223 09/20/2013    Recent Labs Lab 09/20/13 1953  09/22/13 0405  NA 139  < > 141  K 4.3  < > 4.5  CL 107  < > 108  CO2 22  < > 22  BUN 36*  < > 32*  CREATININE 1.35*  < > 1.20*  CALCIUM 9.0  < > 8.8  PROT 6.6  --   --   BILITOT 0.2*  --   --   ALKPHOS 87  --   --   ALT 22  --   --   AST 20  --   --   GLUCOSE 129*  < > 136*  < > = values in this interval not displayed.  Lab Results  Component Value Date   CHOL 153 02/14/2013   HDL 47.20 02/14/2013   LDLCALC 80 02/14/2013   TRIG 130.0 02/14/2013     Diagnostic Studies/Procedures   Dg Chest 2 View 09/20/2013   CLINICAL DATA:  Palpitations and dizziness with hypertension.  EXAM: CHEST  2 VIEW  COMPARISON:  05/19/2013  FINDINGS: The lungs are adequately inflated with minimal linear scarring over the left base. There is no consolidation or effusion. The cardiomediastinal silhouette and remainder of the exam is unchanged.  IMPRESSION: No acute cardiopulmonary disease.   Electronically Signed   By: Elberta Fortis M.D.   On: 09/20/2013 20:52   2D Echo 09/21/13 - Left ventricle: The cavity size was normal. Wall thickness was normal. Systolic function was mildly to moderately reduced. The estimated ejection fraction was in the range of 40% to 45%. Regional wall motion abnormalities cannot be excluded. Doppler parameters are consistent  with a reversible restrictive pattern, indicative of decreased left ventricular diastolic compliance and/or increased left atrial pressure (grade 3 diastolic dysfunction). - Aortic valve: Trivial regurgitation. - Mitral valve: There is significant mitral annular calcification predominantly posteriorly. Posterior leaflet of the mitral valve is severely thickened and calcified. There is an echodensity attached to the posterior leaflet of the mitral valve measuring 17 x 8 mm that is partially mobile. It appears calcified. This might represent calcified leaflet or a vegetation. If clinically indicated TEE is recommended. - Left atrium: The atrium was mildly dilated. - Atrial septum: No defect or patent foramen ovale was identified.  Discharge Medications     Medication List    STOP taking these medications       amLODipine-valsartan 5-320 MG per tablet  Commonly known as:  EXFORGE     aspirin 81 MG chewable tablet  TAKE these medications       albuterol 108 (90 BASE) MCG/ACT inhaler  Commonly known as:  PROVENTIL HFA;VENTOLIN HFA  Inhale 2 puffs into the lungs every 6 (six) hours as needed for wheezing or shortness of breath.     ALPRAZolam 0.25 MG tablet  Commonly known as:  XANAX  Take 1 tablet (0.25 mg total) by mouth 3 (three) times daily as needed for anxiety.     apixaban 5 MG Tabs tablet  Commonly known as:  ELIQUIS  Take 1 tablet (5 mg total) by mouth 2 (two) times daily.     CALCIUM 600+D 600-200 MG-UNIT Tabs  Generic drug:  Calcium Carbonate-Vitamin D  Take 1 tablet by mouth 2 (two) times daily.     diltiazem 120 MG 24 hr capsule  Commonly known as:  CARDIZEM CD  Take 1 capsule (120 mg total) by mouth daily.  Start taking on:  09/23/2013     furosemide 40 MG tablet  Commonly known as:  LASIX  Take 40 mg by mouth daily.     isosorbide mononitrate 60 MG 24 hr tablet  Commonly known as:  IMDUR  Take 1.5 tablets (90 mg total) by mouth daily. Pt request  west-wards tabs     lansoprazole 30 MG capsule  Commonly known as:  PREVACID  Take 1 capsule (30 mg total) by mouth 2 (two) times daily.     levothyroxine 50 MCG tablet  Commonly known as:  SYNTHROID, LEVOTHROID  Take 50 mcg by mouth daily.     multivitamin tablet  Take 1 tablet by mouth daily.     nitroGLYCERIN 0.4 MG SL tablet  Commonly known as:  NITROSTAT  Place 0.4 mg under the tongue every 5 (five) minutes as needed for chest pain. If pain after 3 pills- call 911     pentosan polysulfate 100 MG capsule  Commonly known as:  ELMIRON  Take 100 mg by mouth 3 (three) times daily before meals.     potassium chloride 10 MEQ tablet  Commonly known as:  K-DUR,KLOR-CON  Take 1 tablet (10 mEq total) by mouth daily.     pravastatin 40 MG tablet  Commonly known as:  PRAVACHOL  Take 40 mg by mouth every morning.     valsartan 320 MG tablet  Commonly known as:  DIOVAN  Take 1 tablet (320 mg total) by mouth daily.        Disposition   The patient will be discharged in stable condition to home. Discharge Orders   Future Appointments Provider Department Dept Phone   10/03/2013 3:00 PM Rosalio Macadamia, NP [facility] [place] Office [phone]   10/13/2013 3:30 PM Rollene Rotunda, MD [facility] [place] Office [phone]   10/20/2013 11:30 AM Evie Lacks, [facility] [place] Office [phone]   11/18/2013 3:30 PM Stacie Glaze, MD [facility] [phone]   Future Orders Complete By Expires   Diet - low sodium heart healthy  As directed    Discharge instructions  As directed    Comments:     Call your doctor if you notice any unusual bleeding! This includes blood in your stool, blood in your urine, black tarry stools, nosebleeds, and so forth.  Please call the doctor TODAY that prescribed Elmiron - this medicine can have very weak blood thinning properties. Since you will be on a new blood thinner (Eliquis), please ask if there are any  alternatives you can use instead  of Elmiron.  Please continue to monitor your heart rate and blood pressure at home. Please call your doctor if you get any abnormal high or low readings.   Increase activity slowly  As directed      Follow-up Information   Follow up with Norma Fredrickson, NP. (10/03/13 at 3pm)    Specialty:  Nurse Practitioner   Contact information:   [address] [place] [address] [phone]       Follow up with [facility]. (Blood Thinner Education Visit on 10/20/13 at 11:30am (since you are new to [facility]))    Specialty:  Cardiology   Contact information:   [address] [place] [address] [phone]      Follow up with Carrie Mew, MD. (Your blood sugar was slightly elevated in the hospital. Please discuss further evaluation for diabetes.)    Specialty:  Internal Medicine   Contact information:   [address] Christena Flake [facility] [place] [address] [phone]         Duration of Discharge Encounter: Greater than 30 minutes including physician and PA time.  Signed, Ronie Spies PA-C 09/22/2013, 10:58 AM  Patient seen and examined.  Plan as discussed in my rounding note for today and outlined above. Fayrene Fearing Janelle Spellman  09/22/2013  1:25 PM

## 2013-09-22 NOTE — Progress Notes (Signed)
Pt resting on bed comfortable on RA, VSS, denies any pain or discomfort no distress noticed. We'll continue with POC.

## 2013-09-22 NOTE — Progress Notes (Signed)
Pt 04x, no complaints of CP or SOB, Pt states MD is d/c her today. Will continue to monitor

## 2013-09-22 NOTE — Telephone Encounter (Signed)
New problem    TCM 10/03/13 @ 3pm per Danya PA calling.

## 2013-09-22 NOTE — Progress Notes (Addendum)
Pt d/c home. Called [phone] Dr Annabell Howells, unable to in touch with office. Call heartcare [phone]. Pt state can not make 11/24. appt changed to 11-18 1130am. D/c instructions and medications reviewed with Pt.  Pt states understanding. All Pt questions answered

## 2013-09-22 NOTE — Progress Notes (Signed)
   SUBJECTIVE:  She is feeling better than on admission.  Her heart rate seems to be better controlled.  No acute complaints.  Still slightly SOB.    PHYSICAL EXAM Filed Vitals:   09/21/13 1500 09/21/13 1700 09/21/13 1955 09/22/13 0506  BP: 110/60 120/66 106/72 108/61  Pulse: 92 90 78 66  Temp: 98 F (36.7 C) 98 F (36.7 C) 98.2 F (36.8 C) 98.5 F (36.9 C)  TempSrc: Oral Oral Oral Oral  Resp: 20 20 18 19   Height:      Weight:    146 lb 14.4 oz (66.633 kg)  SpO2: 98% 98% 98% 98%   General:  No distress Lungs:  Clear Heart:  Irregular Abdomen:  Positive bowel sounds, no rebound no guarding Extremities:  No edema  LABS: Lab Results  Component Value Date   TROPONINI <0.30 04/27/2013   Results for orders placed during the hospital encounter of 09/20/13 (from the past 24 hour(s))  BASIC METABOLIC PANEL     Status: Abnormal   Collection Time    09/22/13  4:05 AM      Result Value Range   Sodium 141  135 - 145 mEq/L   Potassium 4.5  3.5 - 5.1 mEq/L   Chloride 108  96 - 112 mEq/L   CO2 22  19 - 32 mEq/L   Glucose, Bld 136 (*) 70 - 99 mg/dL   BUN 32 (*) 6 - 23 mg/dL   Creatinine, Ser 1.19 (*) 0.50 - 1.10 mg/dL   Calcium 8.8  8.4 - 14.7 mg/dL   GFR calc non Af Amer 40 (*) >90 mL/min   GFR calc Af Amer 46 (*) >90 mL/min    Intake/Output Summary (Last 24 hours) at 09/22/13 8295 Last data filed at 09/22/13 0539  Gross per 24 hour  Intake   1181 ml  Output   3450 ml  Net  -2269 ml    ASSESSMENT AND PLAN:  ATRIAL FIB::  I will plan DCCV in 3 week after she has been on anticoagulation.  For now rate control.   ACUTE ON CHRONIC SYSTOLIC AND DIASTOLIC DYSFUNCTION::  EF about 45%.    She seems to be euvolemic.  Send her home with her previous Dose of Lasix.   Rollene Rotunda 09/22/2013 6:35 AM

## 2013-09-23 NOTE — Telephone Encounter (Signed)
Spoke with pt - she is aware of her appt has all medications and is feeling a little weak but much better.

## 2013-09-27 ENCOUNTER — Ambulatory Visit (INDEPENDENT_AMBULATORY_CARE_PROVIDER_SITE_OTHER): Payer: Medicare Other | Admitting: Nurse Practitioner

## 2013-09-27 ENCOUNTER — Encounter: Payer: Self-pay | Admitting: Nurse Practitioner

## 2013-09-27 VITALS — BP 150/70 | HR 117 | Ht 63.0 in | Wt 147.8 lb

## 2013-09-27 DIAGNOSIS — I4891 Unspecified atrial fibrillation: Secondary | ICD-10-CM

## 2013-09-27 MED ORDER — APIXABAN 5 MG PO TABS: 2.5000 mg | ORAL_TABLET | Freq: Two times a day (BID) | ORAL | Status: DC

## 2013-09-27 NOTE — Patient Instructions (Signed)
Cut the Eliquis back to just a half a tablet 2 times a day  I will see you in about 2 1/2 weeks to plan for a cardioversion  Call the [facility] office at [phone] if you have any questions, problems or concerns.

## 2013-09-27 NOTE — Progress Notes (Signed)
Victoria Gomez Date of Birth: Jun 19, 1925 Medical Record #[id number]  History of Present Illness: Victoria Gomez is seen back today for a post hospital/TOC visit. Seen for Dr. Antoine Poche. She is an 79 year old female with known CAD with past PCI to the RCA in 2001, repeat PCI to the RCA in 2003 due to ISR, last cath in 2012 - to be managed medically, chronic combined systolic/diastolic CHF with an EF of 40 to 45%, CKD, HTN, prior symptomatic bradycardia/orthostasis in October of 2013 (BB discontinued and antihypertensives cut back). Other issues as noted below.   Most recently presented to the hospital with elevated HR. Found to be in atrial fib with variable AV block, LBBB. She was diuresed. The Norvasc component of her Exforge was stopped and diltiazem was started for rate control. EF was 40 to 45% with grade 3 diastolic dysfunction, and MV abnormality. Dr. Antoine Poche believes the MV abnormality is a calcified leaflet and was not inclined to proceed with TEE. Placed on Eliquis. Plan is for cardioversion in 3 weeks after being on anticoagulation.   It was noted that there is a possible interaction with her Elmiron - can exert a weak anticoagulant activity but this combination was tolerated, her CBC normal and no signs of bleeding. She was asked to follow up with the doctor that prescribed the Elmiron to see if an alternative could be found.   Comes back today. Here with her son. She says she is doing ok. Some palpitations. Little short of breath. Feels ok on her medicines. Not dizzy or lightheaded. No bleeding. No bruising. Some swelling in her feet. Weight is stable at home.   She tells me that her one son in [place] is trying to have her declared demented and place her in an assisted living facility. She is quite upset by this. 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 She cares for another son with cancer.   Current Outpatient Prescriptions  Medication Sig Dispense  Refill  . albuterol (PROVENTIL HFA;VENTOLIN HFA) 108 (90 BASE) MCG/ACT inhaler Inhale 2 puffs into the lungs every 6 (six) hours as needed for wheezing or shortness of breath.  1 Inhaler  2  . ALPRAZolam (XANAX) 0.25 MG tablet Take 1 tablet (0.25 mg total) by mouth 3 (three) times daily as needed for anxiety.  30 tablet  1  . apixaban (ELIQUIS) 5 MG TABS tablet Take 1 tablet (5 mg total) by mouth 2 (two) times daily.  60 tablet  3  . Calcium Carbonate-Vitamin D (CALCIUM 600+D) 600-200 MG-UNIT TABS Take 1 tablet by mouth 2 (two) times daily.       [name] diltiazem (CARDIZEM CD) 120 MG 24 hr capsule Take 1 capsule (120 mg total) by mouth daily.  30 capsule  3  . furosemide (LASIX) 40 MG tablet Take 40 mg by mouth daily.      . isosorbide mononitrate (IMDUR) 60 MG 24 hr tablet Take 1.5 tablets (90 mg total) by mouth daily. Pt request west-wards tabs  135 tablet  2  . lansoprazole (PREVACID) 30 MG capsule Take 1 capsule (30 mg total) by mouth 2 (two) times daily.  180 capsule  3  . levothyroxine (SYNTHROID, LEVOTHROID) 50 MCG tablet Take 50 mcg by mouth daily.      . Multiple Vitamin (MULTIVITAMIN) tablet Take 1 tablet by mouth daily.        . nitroGLYCERIN (NITROSTAT) 0.4 MG SL tablet Place 0.4 mg under the tongue  every 5 (five) minutes as needed for chest pain. If pain after 3 pills- call 911      . pentosan polysulfate (ELMIRON) 100 MG capsule Take 100 mg by mouth 3 (three) times daily before meals.        . potassium chloride (K-DUR,KLOR-CON) 10 MEQ tablet Take 1 tablet (10 mEq total) by mouth daily.  90 tablet  3  . pravastatin (PRAVACHOL) 40 MG tablet Take 40 mg by mouth every morning.      . valsartan (DIOVAN) 320 MG tablet Take 1 tablet (320 mg total) by mouth daily.  30 tablet  3   No current facility-administered medications for this visit.    Allergies  Allergen Reactions  . Codeine Hives and Nausea And Vomiting  . Sulfonamide Derivatives Swelling    Turns red    Past Medical History    Diagnosis Date  . HYPERLIPIDEMIA 03/07/2008  . HYPERTENSION 08/03/2007  . CORONARY ARTERY DISEASE     a. s/p PCI to RCA '01. b. repeat PCI to RCA '03 2/2 ISR. c. cath 2012: RCA dz, rx medically.  . Acute on chronic systolic heart failure   . HYPOTHYROIDISM   . Esophageal reflux   . DIVERTICULOSIS, COLON   . BREAST MASS, BENIGN   . BARRETT'S ESOPHAGUS, HX OF   . Interstitial cystitis   . Arthritis   . Internal hemorrhoids   . Epistaxis   . Right hand fracture   . Bladder disorder   . Myocardial infarction   . Bradycardia 08/2012    a. Eval for symptomatic bradycardia 08/2012 - beta blocker discontinued.  . Orthostatic hypotension 08/2012    a. 08/2012 w/ syncope - meds adjusted.   . Atrial fibrillation     a. Dx 09/2013.  [name] LBBB (left bundle branch block)     Past Surgical History  Procedure Laterality Date  . Breast surgery      benign mass  . Cholecystectomy    . Cesarean section    . Appendectomy    . Cataract extraction, bilateral    . Tonsillectomy    . Abdominal hysterectomy    . Ankle reconstruction      Left  . Wrist reconstruction      Right    History  Smoking status  . Former Smoker -- 1.00 packs/day for 15 years  . Types: Cigarettes  . Quit date: 11/10/1969  Smokeless tobacco  . Never Used    History  Alcohol Use No    Family History  Problem Relation Age of Onset  . Colon cancer Mother   . Coronary artery disease Mother   . Heart disease Mother   . Coronary artery disease Father   . Heart disease Father   . Colon polyps Sister   . Coronary artery disease Sister   . Coronary artery disease Brother   . Diabetes type II Son     Review of Systems: The review of systems is per the HPI.  All other systems were reviewed and are negative.  Physical Exam: BP 150/70  Pulse 117  Ht 5\' 3"  (1.6 m)  Wt 147 lb 12.8 oz (67.042 kg)  BMI 26.19 kg/m2  SpO2 99% Patient is very pleasant and in no acute distress. She is very appropriate in her  thought process. Well dressed. Skin is warm and dry. Color is normal.  HEENT is unremarkable. Normocephalic/atraumatic. PERRL. Sclera are nonicteric. Neck is supple. No masses. No JVD. Lungs are clear. Cardiac exam shows an  irregular rhythm. Rate is ok. Abdomen is soft. Extremities are with 1+ ankle edema. Gait and ROM are intact. No gross neurologic deficits noted.  Wt Readings from Last 3 Encounters:  09/27/13 147 lb 12.8 oz (67.042 kg)  09/22/13 146 lb 14.4 oz (66.633 kg)  07/04/13 151 lb (68.493 kg)     LABORATORY DATA: EKG with LBBB and atrial fib.   Lab Results  Component Value Date   WBC 10.5 09/20/2013   HGB 12.5 09/20/2013   HCT 36.7 09/20/2013   PLT 223 09/20/2013   GLUCOSE 136* 09/22/2013   CHOL 153 02/14/2013   TRIG 130.0 02/14/2013   HDL 47.20 02/14/2013   LDLDIRECT 79.7 12/11/2010   LDLCALC 80 02/14/2013   ALT 22 09/20/2013   AST 20 09/20/2013   NA 141 09/22/2013   K 4.5 09/22/2013   CL 108 09/22/2013   CREATININE 1.20* 09/22/2013   BUN 32* 09/22/2013   CO2 22 09/22/2013   TSH 0.850 09/21/2013   INR 1.0 01/27/2011   HGBA1C 6.8* 05/06/2011    Echo Study Conclusions  - Left ventricle: The cavity size was normal. Wall thickness was normal. Systolic function was mildly to moderately reduced. The estimated ejection fraction was in the range of 40% to 45%. Regional wall motion abnormalities cannot be excluded. Doppler parameters are consistent with a reversible restrictive pattern, indicative of decreased left ventricular diastolic compliance and/or increased left atrial pressure (grade 3 diastolic dysfunction). - Aortic valve: Trivial regurgitation. - Mitral valve: There is significant mitral annular calcification predominantly posteriorly. Posterior leaflet of the mitral valve is severely thickened and calcified. There is an echodensity attached to the posterior leaflet of the mitral valve measuring 17 x 8 mm that is partially mobile. It appears calcified. This might  represent calcified leaflet or a vegetation. If clinically indicated TEE is recommended. - Left atrium: The atrium was mildly dilated. - Atrial septum: No defect or patent foramen ovale was identified.    Assessment / Plan: 1. Atrial fib - on Eliquis and managed with rate control - I have talked with the pharmacist here today - with her CKD, age and being on CCB therapy - it is felt to be on the lower dose of Eliquis - will cut her back to 2.5 mg BID. See her back in about 2 weeks to plan for cardioversion.  2. HTN - BP is ok.  3. Combined systolic and diastolic HF - hope her EF will improve with restoration of sinus rhythm.   4. Social issues - she is quite upset about what her one son is trying to do. I have asked her to speak to Dr. Lovell Sheehan as well as to talk with her attorney. She is going to try and get a new health care power of attorney as well. Just from my first meeting with her, she does not appear to be incompetent to me, very appropriate, etc.   Patient is agreeable to this plan and will call if any problems develop in the interim.   Rosalio Macadamia, RN, ANP-C [facility] [address] [place], [place]  [address]

## 2013-09-28 ENCOUNTER — Telehealth: Payer: Self-pay | Admitting: Internal Medicine

## 2013-09-28 NOTE — Telephone Encounter (Signed)
Talked with pt and will see padonda on11/26/bmw

## 2013-09-28 NOTE — Telephone Encounter (Signed)
Caller: Taralynn/Patient; Phone: [phone]; Reason for Call: Asking Kendal Hymen to call regarding "a situation."  Two sons are talking about putting her in an assisted living facility for dementia.  Florice says Dr Lovell Sheehan knows she does not have dementia.  She is able to do her own ADLs and cares for a son with cancer.  Saw cardiologist 09/27/13 status post hospitalization for tachycardia.  Cardiologist suggested she call PCP regarding elevated blood sugar while hospitalized. Please call back.

## 2013-10-03 ENCOUNTER — Encounter: Payer: Medicare Other | Admitting: Nurse Practitioner

## 2013-10-03 ENCOUNTER — Other Ambulatory Visit: Payer: Self-pay | Admitting: Internal Medicine

## 2013-10-03 ENCOUNTER — Other Ambulatory Visit: Payer: Self-pay | Admitting: *Deleted

## 2013-10-05 ENCOUNTER — Ambulatory Visit: Payer: Medicare Other | Admitting: Family

## 2013-10-13 ENCOUNTER — Encounter: Payer: Self-pay | Admitting: Cardiology

## 2013-10-13 ENCOUNTER — Ambulatory Visit (INDEPENDENT_AMBULATORY_CARE_PROVIDER_SITE_OTHER): Payer: Medicare Other | Admitting: Cardiology

## 2013-10-13 ENCOUNTER — Encounter: Payer: Self-pay | Admitting: *Deleted

## 2013-10-13 VITALS — BP 130/70 | HR 88 | Ht 63.0 in | Wt 148.0 lb

## 2013-10-13 DIAGNOSIS — I2510 Atherosclerotic heart disease of native coronary artery without angina pectoris: Secondary | ICD-10-CM | POA: Diagnosis not present

## 2013-10-13 DIAGNOSIS — I4891 Unspecified atrial fibrillation: Secondary | ICD-10-CM

## 2013-10-13 MED ORDER — APIXABAN 5 MG PO TABS: 2.5000 mg | ORAL_TABLET | Freq: Two times a day (BID) | ORAL | Status: DC

## 2013-10-13 MED ORDER — VALSARTAN 320 MG PO TABS: 320.0000 mg | ORAL_TABLET | Freq: Every day | ORAL | Status: DC

## 2013-10-13 MED ORDER — DILTIAZEM HCL ER COATED BEADS 120 MG PO CP24: 120.0000 mg | ORAL_CAPSULE | Freq: Every day | ORAL | Status: DC

## 2013-10-13 NOTE — Progress Notes (Signed)
HPI Patient presents for followup after recent hospitalization. He was found to have a rapid rate and she noted it on her blood pressure monitor. She really wasn't feeling the palpitations though she did have some shortness of breath and some volume overload. She was noted to be in atrial fibrillation. She was treated with anticoagulation and rate control. She did have an echocardiogram with her EF being slightly reduced at 40-45% with some calcium on her mitral valve. She was treated with IV diuresis and improvement in some dyspnea. We have to be careful because she does have some renal insufficiency. She came back for followup once in the clinic and did have her dose of Eliquis reduced with her age and renal insufficiency. She's had some nosebleeds. She's not had any presyncope or syncope. She's still having occasional chest discomfort which is unchanged from some symptoms she's had recently but different than her previous angina.  She's able to be quite active doing her chores and shopping without bringing on any of the symptoms.  Allergies  Allergen Reactions  . Codeine Hives and Nausea And Vomiting  . Sulfonamide Derivatives Swelling    Turns red    Current Outpatient Prescriptions  Medication Sig Dispense Refill  . albuterol (PROVENTIL HFA;VENTOLIN HFA) 108 (90 BASE) MCG/ACT inhaler Inhale 2 puffs into the lungs every 6 (six) hours as needed for wheezing or shortness of breath.  1 Inhaler  2  . apixaban (ELIQUIS) 5 MG TABS tablet Take 0.5 tablets (2.5 mg total) by mouth 2 (two) times daily.  60 tablet  3  . Calcium Carbonate-Vitamin D (CALCIUM 600+D) 600-200 MG-UNIT TABS Take 1 tablet by mouth 2 (two) times daily.       [name] diltiazem (CARDIZEM CD) 120 MG 24 hr capsule Take 1 capsule (120 mg total) by mouth daily.  30 capsule  3  . furosemide (LASIX) 40 MG tablet Take 40 mg by mouth daily.      . isosorbide mononitrate (IMDUR) 60 MG 24 hr tablet Take 1.5 tablets (90 mg total) by mouth daily.  Pt request west-wards tabs  135 tablet  2  . lansoprazole (PREVACID) 30 MG capsule Take 1 capsule (30 mg total) by mouth 2 (two) times daily.  180 capsule  3  . Multiple Vitamin (MULTIVITAMIN) tablet Take 1 tablet by mouth daily.        . nitroGLYCERIN (NITROSTAT) 0.4 MG SL tablet Place 0.4 mg under the tongue every 5 (five) minutes as needed for chest pain. If pain after 3 pills- call 911      . potassium chloride (K-DUR,KLOR-CON) 10 MEQ tablet Take 1 tablet (10 mEq total) by mouth daily.  90 tablet  3  . pravastatin (PRAVACHOL) 40 MG tablet Take 40 mg by mouth every morning.      . pravastatin (PRAVACHOL) 40 MG tablet TAKE 1 TABLET DAILY  90 tablet  3  . SYNTHROID 50 MCG tablet TAKE 1 TABLET DAILY  90 tablet  3  . valsartan (DIOVAN) 320 MG tablet Take 1 tablet (320 mg total) by mouth daily.  30 tablet  3   No current facility-administered medications for this visit.    Past Medical History  Diagnosis Date  . HYPERLIPIDEMIA 03/07/2008  . HYPERTENSION 08/03/2007  . CORONARY ARTERY DISEASE     a. s/p PCI to RCA '01. b. repeat PCI to RCA '03 2/2 ISR. c. cath 2012: RCA dz, rx medically.  . Acute on chronic systolic heart failure   . HYPOTHYROIDISM   .  Esophageal reflux   . DIVERTICULOSIS, COLON   . BREAST MASS, BENIGN   . BARRETT'S ESOPHAGUS, HX OF   . Interstitial cystitis   . Arthritis   . Internal hemorrhoids   . Epistaxis   . Right hand fracture   . Bladder disorder   . Myocardial infarction   . Bradycardia 08/2012    a. Eval for symptomatic bradycardia 08/2012 - beta blocker discontinued.  . Orthostatic hypotension 08/2012    a. 08/2012 w/ syncope - meds adjusted.   . Atrial fibrillation     a. Dx 09/2013.  [name] LBBB (left bundle branch block)     Past Surgical History  Procedure Laterality Date  . Breast surgery      benign mass  . Cholecystectomy    . Cesarean section    . Appendectomy    . Cataract extraction, bilateral    . Tonsillectomy    . Abdominal hysterectomy     . Ankle reconstruction      Left  . Wrist reconstruction      Right    ROS:  As stated in the HPI and negative for all other systems.  PHYSICAL EXAM BP 130/70  Pulse 88  Ht 5\' 3"  (1.6 m)  Wt 148 lb (67.132 kg)  BMI 26.22 kg/m2 GENERAL:  Well appearing HEENT:  Pupils equal round and reactive, fundi not visualized, oral mucosa unremarkable NECK:  No jugular venous distention, waveform within normal limits, carotid upstroke brisk and symmetric, no bruits, no thyromegaly LUNGS:  Decreased breath sounds in the left lung base with few basilar crackles. BACK:  No CVA tenderness CHEST: Unremarkable HEART:  PMI not displaced or sustained,S1 and S2 within normal limits, no S3, no clicks, no rubs, no murmurs, irregular ABD:  Flat, positive bowel sounds normal in frequency in pitch, no bruits, no rebound, no guarding, no midline pulsatile mass, no hepatomegaly, no splenomegaly EXT:  2 plus pulses throughout, mild right greater than left edema, no cyanosis no clubbing   ASSESSMENT AND PLAN  ATRIAL FIB -  She has tolerated her anticoagulation. Today I discussed with her at length cardioversion. She is ready to have this done and I will arrange it. She will otherwise remain on the meds as listed.  CORONARY ARTERY DISEASE - Her chest pain is atypical and unlikely to be angina. It is not different than symptoms she had at the time of her last catheterization. Therefore, no further imaging is available. She will let me know if she has any worsening symptoms.  HYPERTENSION -  Her blood pressure is well controlled. She will continue the meds as listed.  Diastolic HF (heart failure) - She seems to be euvolemic. No change in therapy is indicated.  No further cardiovascular testing is indicated.

## 2013-10-13 NOTE — Patient Instructions (Signed)
The current medical regimen is effective;  continue present plan and medications.  Your physician has requested that you have a Cardioversion with Dr Antoine Poche.  Electrical Cardioversion uses a jolt of electricity to your heart either through paddles or wired patches attached to your chest. This is a controlled, usually prescheduled, procedure. This procedure is done at the hospital and you are not awake during the procedure. You usually go home the day of the procedure. Please see the instruction sheet given to you today for more information.  Follow up will be scheduled for you after your cardioversion.

## 2013-10-17 ENCOUNTER — Telehealth: Payer: Self-pay | Admitting: Cardiology

## 2013-10-17 NOTE — Telephone Encounter (Signed)
Patient wants to know if her cardioversion is this Thursday.   Advised her that Dr Antoine Poche and Elita Quick, his primary nurse are not in today and that I am unable to determine from the chart if a date has been scheduled. Told her they will be in the office tomorrow and that I would forward this message to [facility]. Pt verbalizes understanding.

## 2013-10-17 NOTE — Telephone Encounter (Signed)
New message  Pt following up to have her Cardioversion scheduled// please call to assist

## 2013-10-18 NOTE — Telephone Encounter (Signed)
Pt scheduled for out pt cardioversion 12/11 with Dr Antoine Poche at 12:15.  She is aware of instructions, date and time.  She denies any questions.  Case # [phone]

## 2013-10-20 ENCOUNTER — Ambulatory Visit (HOSPITAL_COMMUNITY)
Admission: RE | Admit: 2013-10-20 | Discharge: 2013-10-20 | Disposition: A | Discharge status: Home / Self Care | Payer: Medicare Other | Source: Ambulatory Visit | Attending: Cardiology | Admitting: Cardiology

## 2013-10-20 ENCOUNTER — Ambulatory Visit: Payer: Medicare Other | Admitting: Pharmacist

## 2013-10-20 ENCOUNTER — Ambulatory Visit (HOSPITAL_COMMUNITY): Payer: Medicare Other | Admitting: Anesthesiology

## 2013-10-20 ENCOUNTER — Encounter (HOSPITAL_COMMUNITY): Payer: Medicare Other | Admitting: Anesthesiology

## 2013-10-20 ENCOUNTER — Encounter (HOSPITAL_COMMUNITY)
Admission: RE | Disposition: A | Discharge status: Home / Self Care | Payer: Self-pay | Source: Ambulatory Visit | Attending: Cardiology

## 2013-10-20 ENCOUNTER — Encounter (HOSPITAL_COMMUNITY): Payer: Self-pay | Admitting: Gastroenterology

## 2013-10-20 DIAGNOSIS — I2510 Atherosclerotic heart disease of native coronary artery without angina pectoris: Secondary | ICD-10-CM | POA: Insufficient documentation

## 2013-10-20 DIAGNOSIS — I10 Essential (primary) hypertension: Secondary | ICD-10-CM | POA: Insufficient documentation

## 2013-10-20 DIAGNOSIS — I4891 Unspecified atrial fibrillation: Secondary | ICD-10-CM

## 2013-10-20 DIAGNOSIS — Z7901 Long term (current) use of anticoagulants: Secondary | ICD-10-CM | POA: Insufficient documentation

## 2013-10-20 DIAGNOSIS — I5023 Acute on chronic systolic (congestive) heart failure: Secondary | ICD-10-CM | POA: Diagnosis not present

## 2013-10-20 HISTORY — PX: CARDIOVERSION: SHX1299

## 2013-10-20 SURGERY — CARDIOVERSION
Anesthesia: General

## 2013-10-20 MED ORDER — SODIUM CHLORIDE 0.9 % IV SOLN
INTRAVENOUS | Status: DC
  Administered 2013-10-20: 12:00:00 via INTRAVENOUS

## 2013-10-20 MED ORDER — PROPOFOL 10 MG/ML IV BOLUS
INTRAVENOUS | Status: DC | PRN
  Administered 2013-10-20: 50 mg via INTRAVENOUS

## 2013-10-20 MED ORDER — LIDOCAINE HCL (CARDIAC) 20 MG/ML IV SOLN
INTRAVENOUS | Status: DC | PRN
  Administered 2013-10-20: 100 mg via INTRAVENOUS

## 2013-10-20 NOTE — CV Procedure (Signed)
   Procedure:   DCCV  Indication:  Symptomatic atrial fib.  Procedure Note:  The patient signed informed consent.  She has had therapeutic anticoagulation with Eliquis greater than 3 weeks.  Anesthesia was administered by Dr. Krista Blue.  Adequate airway was maintained throughout and vital followed per protocol.  He was cardioverted x 1 with 150 J of biphasic synchronized energy.  She converted to NSR.    There were no apparent complications.  The patient had normal neuro status and respiratory status post procedure with vitals stable as recorded elsewhere.    Follow up:  We will arrange follow up with me in about 4 weeks.  She will continue on current medical therapy.

## 2013-10-20 NOTE — Anesthesia Postprocedure Evaluation (Signed)
  Anesthesia Post-op Note  Patient: Victoria Gomez  Procedure(s) Performed: Procedure(s): CARDIOVERSION (N/A)  Patient Location: Endoscopy Unit  Anesthesia Type:General  Level of Consciousness: awake, alert , oriented and patient cooperative  Airway and Oxygen Therapy: Patient Spontanous Breathing and Patient connected to nasal cannula oxygen  Post-op Pain: none  Post-op Assessment: Post-op Vital signs reviewed, Patient's Cardiovascular Status Stable, Respiratory Function Stable, Patent Airway and No signs of Nausea or vomiting  Post-op Vital Signs: Reviewed and stable  Complications: No apparent anesthesia complications

## 2013-10-20 NOTE — Interval H&P Note (Signed)
History and Physical Interval Note:  10/20/2013 11:39 AM  Victoria Gomez  has presented today for surgery, with the diagnosis of AFIB  The various methods of treatment have been discussed with the patient and family. After consideration of risks, benefits and other options for treatment, the patient has consented to  Procedure(s): CARDIOVERSION (N/A) as a surgical intervention .  The patient's history has been reviewed, patient examined, no change in status, stable for surgery.  I have reviewed the patient's chart and labs.  Questions were answered to the patient's satisfaction.     Rollene Rotunda

## 2013-10-20 NOTE — Anesthesia Preprocedure Evaluation (Signed)
Anesthesia Evaluation    Reviewed: Allergy & Precautions, H&P , NPO status , Patient's Chart, lab work & pertinent test results, reviewed documented beta blocker date and time   History of Anesthesia Complications Negative for: history of anesthetic complications  Airway       Dental   Pulmonary former smoker,          Cardiovascular hypertension, Pt. on medications + CAD, + Past MI and +CHF + dysrhythmias Atrial Fibrillation     Neuro/Psych negative neurological ROS     GI/Hepatic GERD-  Medicated,  Endo/Other  Hypothyroidism   Renal/GU Renal InsufficiencyRenal disease     Musculoskeletal   Abdominal   Peds  Hematology   Anesthesia Other Findings   Reproductive/Obstetrics                           Anesthesia Physical Anesthesia Plan  ASA: III  Anesthesia Plan: General   Post-op Pain Management:    Induction: Intravenous  Airway Management Planned: Mask  Additional Equipment:   Intra-op Plan:   Post-operative Plan:   Informed Consent:   Plan Discussed with: CRNA, Anesthesiologist and Surgeon  Anesthesia Plan Comments:         Anesthesia Quick Evaluation

## 2013-10-20 NOTE — H&P (View-Only) (Signed)
 HPI Patient presents for followup after recent hospitalization. He was found to have a rapid rate and she noted it on her blood pressure monitor. She really wasn't feeling the palpitations though she did have some shortness of breath and some volume overload. She was noted to be in atrial fibrillation. She was treated with anticoagulation and rate control. She did have an echocardiogram with her EF being slightly reduced at 40-45% with some calcium on her mitral valve. She was treated with IV diuresis and improvement in some dyspnea. We have to be careful because she does have some renal insufficiency. She came back for followup once in the clinic and did have her dose of Eliquis reduced with her age and renal insufficiency. She's had some nosebleeds. She's not had any presyncope or syncope. She's still having occasional chest discomfort which is unchanged from some symptoms she's had recently but different than her previous angina.  She's able to be quite active doing her chores and shopping without bringing on any of the symptoms.  Allergies  Allergen Reactions  . Codeine Hives and Nausea And Vomiting  . Sulfonamide Derivatives Swelling    Turns red    Current Outpatient Prescriptions  Medication Sig Dispense Refill  . albuterol (PROVENTIL HFA;VENTOLIN HFA) 108 (90 BASE) MCG/ACT inhaler Inhale 2 puffs into the lungs every 6 (six) hours as needed for wheezing or shortness of breath.  1 Inhaler  2  . apixaban (ELIQUIS) 5 MG TABS tablet Take 0.5 tablets (2.5 mg total) by mouth 2 (two) times daily.  60 tablet  3  . Calcium Carbonate-Vitamin D (CALCIUM 600+D) 600-200 MG-UNIT TABS Take 1 tablet by mouth 2 (two) times daily.       . diltiazem (CARDIZEM CD) 120 MG 24 hr capsule Take 1 capsule (120 mg total) by mouth daily.  30 capsule  3  . furosemide (LASIX) 40 MG tablet Take 40 mg by mouth daily.      . isosorbide mononitrate (IMDUR) 60 MG 24 hr tablet Take 1.5 tablets (90 mg total) by mouth daily.  Pt request west-wards tabs  135 tablet  2  . lansoprazole (PREVACID) 30 MG capsule Take 1 capsule (30 mg total) by mouth 2 (two) times daily.  180 capsule  3  . Multiple Vitamin (MULTIVITAMIN) tablet Take 1 tablet by mouth daily.        . nitroGLYCERIN (NITROSTAT) 0.4 MG SL tablet Place 0.4 mg under the tongue every 5 (five) minutes as needed for chest pain. If pain after 3 pills- call 911      . potassium chloride (K-DUR,KLOR-CON) 10 MEQ tablet Take 1 tablet (10 mEq total) by mouth daily.  90 tablet  3  . pravastatin (PRAVACHOL) 40 MG tablet Take 40 mg by mouth every morning.      . pravastatin (PRAVACHOL) 40 MG tablet TAKE 1 TABLET DAILY  90 tablet  3  . SYNTHROID 50 MCG tablet TAKE 1 TABLET DAILY  90 tablet  3  . valsartan (DIOVAN) 320 MG tablet Take 1 tablet (320 mg total) by mouth daily.  30 tablet  3   No current facility-administered medications for this visit.    Past Medical History  Diagnosis Date  . HYPERLIPIDEMIA 03/07/2008  . HYPERTENSION 08/03/2007  . CORONARY ARTERY DISEASE     a. s/p PCI to RCA '01. b. repeat PCI to RCA '03 2/2 ISR. c. cath 2012: RCA dz, rx medically.  . Acute on chronic systolic heart failure   . HYPOTHYROIDISM   .   Esophageal reflux   . DIVERTICULOSIS, COLON   . BREAST MASS, BENIGN   . BARRETT'S ESOPHAGUS, HX OF   . Interstitial cystitis   . Arthritis   . Internal hemorrhoids   . Epistaxis   . Right hand fracture   . Bladder disorder   . Myocardial infarction   . Bradycardia 08/2012    a. Eval for symptomatic bradycardia 08/2012 - beta blocker discontinued.  . Orthostatic hypotension 08/2012    a. 08/2012 w/ syncope - meds adjusted.   . Atrial fibrillation     a. Dx 09/2013.  . LBBB (left bundle branch block)     Past Surgical History  Procedure Laterality Date  . Breast surgery      benign mass  . Cholecystectomy    . Cesarean section    . Appendectomy    . Cataract extraction, bilateral    . Tonsillectomy    . Abdominal hysterectomy     . Ankle reconstruction      Left  . Wrist reconstruction      Right    ROS:  As stated in the HPI and negative for all other systems.  PHYSICAL EXAM BP 130/70  Pulse 88  Ht 5' 3" (1.6 m)  Wt 148 lb (67.132 kg)  BMI 26.22 kg/m2 GENERAL:  Well appearing HEENT:  Pupils equal round and reactive, fundi not visualized, oral mucosa unremarkable NECK:  No jugular venous distention, waveform within normal limits, carotid upstroke brisk and symmetric, no bruits, no thyromegaly LUNGS:  Decreased breath sounds in the left lung base with few basilar crackles. BACK:  No CVA tenderness CHEST: Unremarkable HEART:  PMI not displaced or sustained,S1 and S2 within normal limits, no S3, no clicks, no rubs, no murmurs, irregular ABD:  Flat, positive bowel sounds normal in frequency in pitch, no bruits, no rebound, no guarding, no midline pulsatile mass, no hepatomegaly, no splenomegaly EXT:  2 plus pulses throughout, mild right greater than left edema, no cyanosis no clubbing   ASSESSMENT AND PLAN  ATRIAL FIB -  She has tolerated her anticoagulation. Today I discussed with her at length cardioversion. She is ready to have this done and I will arrange it. She will otherwise remain on the meds as listed.  CORONARY ARTERY DISEASE - Her chest pain is atypical and unlikely to be angina. It is not different than symptoms she had at the time of her last catheterization. Therefore, no further imaging is available. She will let me know if she has any worsening symptoms.  HYPERTENSION -  Her blood pressure is well controlled. She will continue the meds as listed.  Diastolic HF (heart failure) - She seems to be euvolemic. No change in therapy is indicated.  No further cardiovascular testing is indicated.      

## 2013-10-20 NOTE — Transfer of Care (Signed)
Immediate Anesthesia Transfer of Care Note  Patient: Victoria Gomez  Procedure(s) Performed: Procedure(s): CARDIOVERSION (N/A)  Patient Location: Endoscopy Unit  Anesthesia Type:General  Level of Consciousness: awake, alert , oriented and patient cooperative  Airway & Oxygen Therapy: Patient Spontanous Breathing and Patient connected to nasal cannula oxygen  Post-op Assessment: Report given to PACU RN, Post -op Vital signs reviewed and stable and Patient moving all extremities  Post vital signs: Reviewed and stable  Complications: No apparent anesthesia complications

## 2013-10-21 ENCOUNTER — Encounter (HOSPITAL_COMMUNITY): Payer: Self-pay | Admitting: Cardiology

## 2013-10-21 DIAGNOSIS — I4891 Unspecified atrial fibrillation: Secondary | ICD-10-CM | POA: Diagnosis not present

## 2013-11-18 ENCOUNTER — Ambulatory Visit (INDEPENDENT_AMBULATORY_CARE_PROVIDER_SITE_OTHER): Payer: Medicare Other | Admitting: Internal Medicine

## 2013-11-18 ENCOUNTER — Encounter: Payer: Self-pay | Admitting: Internal Medicine

## 2013-11-18 VITALS — BP 140/84 | HR 76 | Temp 98.2°F | Resp 16 | Ht 63.0 in | Wt 148.0 lb

## 2013-11-18 DIAGNOSIS — N183 Chronic kidney disease, stage 3 unspecified: Secondary | ICD-10-CM

## 2013-11-18 DIAGNOSIS — I4891 Unspecified atrial fibrillation: Secondary | ICD-10-CM

## 2013-11-18 DIAGNOSIS — I10 Essential (primary) hypertension: Secondary | ICD-10-CM

## 2013-11-18 DIAGNOSIS — R7309 Other abnormal glucose: Secondary | ICD-10-CM | POA: Diagnosis not present

## 2013-11-18 DIAGNOSIS — R7303 Prediabetes: Secondary | ICD-10-CM

## 2013-11-18 LAB — BASIC METABOLIC PANEL
BUN: 18 mg/dL (ref 6–23)
CALCIUM: 9.1 mg/dL (ref 8.4–10.5)
CO2: 29 meq/L (ref 19–32)
Chloride: 105 mEq/L (ref 96–112)
Creatinine, Ser: 1.3 mg/dL — ABNORMAL HIGH (ref 0.4–1.2)
GFR: 43.09 mL/min — ABNORMAL LOW (ref >60.00)
GLUCOSE: 100 mg/dL — AB (ref 70–99)
Potassium: 3.6 mEq/L (ref 3.5–5.1)
Sodium: 142 mEq/L (ref 135–145)

## 2013-11-18 LAB — HEMOGLOBIN A1C: Hgb A1c MFr Bld: 6.5 % (ref 4.6–6.5)

## 2013-11-18 NOTE — Patient Instructions (Signed)
The patient is instructed to continue all medications as prescribed. Schedule followup with check out clerk upon leaving the clinic  

## 2013-11-18 NOTE — Progress Notes (Signed)
Subjective:    Patient ID: Victoria Gomez, female    DOB: 1925/06/25, 80 y.o.   MRN: [id number]  HPI Is an 79 year old female who has chronic atrial fibrillation and had a rate dependent heart failure when she was last seen with increased edema in her lower extremities.  She had an episode in which her heart rate got up to the 120 to 1:30 range and was hospitalized for cardioversion to 2 the rate and heart failure.  Since then she has remained in a stable sinus rhythm her blood pressure today is stable her lower extremities showed minimal edema as compared to a previous time.  She was in mild to moderate renal insufficiency probably stage III renal failure with the increased diuretic dose after hydration her creatinine returned closer to her baseline.  We discussed this and will monitor today a basic metabolic panel on the steady state dose of Lasix expected to have stabilized around 1.1   Review of Systems  Constitutional: Positive for fatigue. Negative for activity change and appetite change.  HENT: Negative for congestion, ear pain, postnasal drip and sinus pressure.   Eyes: Negative for redness and visual disturbance.  Respiratory: Negative for cough, shortness of breath and wheezing.   Cardiovascular: Positive for leg swelling.  Gastrointestinal: Negative for abdominal pain and abdominal distention.  Genitourinary: Negative for dysuria, frequency and menstrual problem.  Musculoskeletal: Negative for arthralgias, joint swelling, myalgias and neck pain.  Skin: Negative for rash and wound.  Neurological: Positive for weakness. Negative for dizziness and headaches.  Hematological: Negative for adenopathy. Does not bruise/bleed easily.  Psychiatric/Behavioral: Negative for sleep disturbance and decreased concentration.   Past Medical History  Diagnosis Date  . HYPERLIPIDEMIA 03/07/2008  . HYPERTENSION 08/03/2007  . CORONARY ARTERY DISEASE     a. s/p PCI to RCA '01. b. repeat PCI to  RCA '03 2/2 ISR. c. cath 2012: RCA dz, rx medically.  . Acute on chronic systolic heart failure   . HYPOTHYROIDISM   . Esophageal reflux   . DIVERTICULOSIS, COLON   . BREAST MASS, BENIGN   . BARRETT'S ESOPHAGUS, HX OF   . Interstitial cystitis   . Arthritis   . Internal hemorrhoids   . Epistaxis   . Right hand fracture   . Bladder disorder   . Myocardial infarction   . Bradycardia 08/2012    a. Eval for symptomatic bradycardia 08/2012 - beta blocker discontinued.  . Orthostatic hypotension 08/2012    a. 08/2012 w/ syncope - meds adjusted.   . Atrial fibrillation     a. Dx 09/2013.  [name] LBBB (left bundle branch block)     History   Social History  . Marital Status: Widowed    Spouse Name: N/A    Number of Children: N/A  . Years of Education: N/A   Occupational History  . retired    Social History Main Topics  . Smoking status: Former Smoker -- 1.00 packs/day for 15 years    Types: Cigarettes    Quit date: 11/10/1969  . Smokeless tobacco: Never Used  . Alcohol Use: No  . Drug Use: No  . Sexual Activity: No   Other Topics Concern  . Not on file   Social History Narrative  . No narrative on file    Past Surgical History  Procedure Laterality Date  . Breast surgery      benign mass  . Cholecystectomy    . Cesarean section    . Appendectomy    .  Cataract extraction, bilateral    . Tonsillectomy    . Abdominal hysterectomy    . Ankle reconstruction      Left  . Wrist reconstruction      Right  . Cardioversion N/A 10/20/2013    Procedure: CARDIOVERSION;  Surgeon: Rollene Rotunda, MD;  Location: [facility] ENDOSCOPY;  Service: Cardiovascular;  Laterality: N/A;    Family History  Problem Relation Age of Onset  . Colon cancer Mother   . Coronary artery disease Mother   . Heart disease Mother   . Coronary artery disease Father   . Heart disease Father   . Colon polyps Sister   . Coronary artery disease Sister   . Coronary artery disease Brother   . Diabetes type  II Son     Allergies  Allergen Reactions  . Codeine Hives and Nausea And Vomiting  . Sulfonamide Derivatives Swelling    Turns red    Current Outpatient Prescriptions on File Prior to Visit  Medication Sig Dispense Refill  . albuterol (PROVENTIL HFA;VENTOLIN HFA) 108 (90 BASE) MCG/ACT inhaler Inhale 2 puffs into the lungs every 6 (six) hours as needed for wheezing or shortness of breath.  1 Inhaler  2  . apixaban (ELIQUIS) 5 MG TABS tablet Take 0.5 tablets (2.5 mg total) by mouth 2 (two) times daily.  180 tablet  3  . Calcium Carbonate-Vitamin D (CALCIUM 600+D) 600-200 MG-UNIT TABS Take 1 tablet by mouth 2 (two) times daily.       . furosemide (LASIX) 40 MG tablet Take 40 mg by mouth daily.      . isosorbide mononitrate (IMDUR) 60 MG 24 hr tablet Take 1.5 tablets (90 mg total) by mouth daily. Pt request west-wards tabs  135 tablet  2  . lansoprazole (PREVACID) 30 MG capsule Take 1 capsule (30 mg total) by mouth 2 (two) times daily.  180 capsule  3  . Multiple Vitamin (MULTIVITAMIN) tablet Take 1 tablet by mouth daily.        . nitroGLYCERIN (NITROSTAT) 0.4 MG SL tablet Place 0.4 mg under the tongue every 5 (five) minutes as needed for chest pain. If pain after 3 pills- call 911      . potassium chloride (K-DUR,KLOR-CON) 10 MEQ tablet Take 1 tablet (10 mEq total) by mouth daily.  90 tablet  3  . pravastatin (PRAVACHOL) 40 MG tablet TAKE 1 TABLET DAILY  90 tablet  3  . SYNTHROID 50 MCG tablet TAKE 1 TABLET DAILY  90 tablet  3  . valsartan (DIOVAN) 320 MG tablet Take 1 tablet (320 mg total) by mouth daily.  90 tablet  3   No current facility-administered medications on file prior to visit.    BP 140/84  Pulse 76  Temp(Src) 98.2 F (36.8 C)  Resp 16  Ht 5\' 3"  (1.6 m)  Wt 148 lb (67.132 kg)  BMI 26.22 kg/m2       Objective:   Physical Exam  Nursing note and vitals reviewed. HENT:  Head: Normocephalic and atraumatic.  Eyes: Conjunctivae are normal. Pupils are equal, round, and  reactive to light.  Neck: Neck supple.  Cardiovascular: Regular rhythm.   Murmur heard. Pulmonary/Chest: Effort normal and breath sounds normal.  Abdominal: Soft. Bowel sounds are normal.          Assessment & Plan:  Stable rythmn and HTN Monitoring for CRI ( stage 3 renal failure) Monitor creatinine Monitor a1c... I do not belive at this time we will start any DM medications "  pre diabetes"

## 2013-11-18 NOTE — Progress Notes (Signed)
Pre visit review using our clinic review tool, if applicable. No additional management support is needed unless otherwise documented below in the visit note. 

## 2013-11-21 ENCOUNTER — Encounter: Payer: Self-pay | Admitting: Nurse Practitioner

## 2013-11-21 ENCOUNTER — Ambulatory Visit (INDEPENDENT_AMBULATORY_CARE_PROVIDER_SITE_OTHER): Payer: Medicare Other | Admitting: Nurse Practitioner

## 2013-11-21 VITALS — BP 120/74 | HR 67 | Ht 63.0 in | Wt 150.0 lb

## 2013-11-21 DIAGNOSIS — I4891 Unspecified atrial fibrillation: Secondary | ICD-10-CM | POA: Diagnosis not present

## 2013-11-21 NOTE — Patient Instructions (Addendum)
Stay on your current medicines  Stay active  See Dr. Antoine PocheHochrein in 3 months  Call the [facility] office at [phone] if you have any questions, problems or concerns.

## 2013-11-21 NOTE — Progress Notes (Signed)
Victoria Gomez Date of Birth: January 31, 1925 Medical Record #[id number]  History of Present Illness: Victoria Gomez is seen back today for a post cardioversion visit. Seen for Dr. Antoine PocheHochrein. She is an 80 year old female with known CAD with past PCI to the RCA in 2001, repeat PCI to the RCA in 2003 due to ISR, last cath in 2012 - to be managed medically, chronic combined systolic/diastolic CHF with an EF of 40 to 45%, CKD, HTN, prior symptomatic bradycardia/orthostasis in October of 2013 (BB discontinued and antihypertensives cut back). Other issues as noted below.   Most recently presented to the hospital with elevated HR. Found to be in atrial fib with variable AV block, LBBB. She was diuresed. The Norvasc component of her Exforge was stopped and diltiazem was started for rate control. EF was 40 to 45% with grade 3 diastolic dysfunction, and MV abnormality. Dr. Antoine PocheHochrein believes the MV abnormality is a calcified leaflet and was not inclined to proceed with TEE. Placed on Eliquis. Plan was for cardioversion in 3 weeks after being on anticoagulation.   It was noted that there is a possible interaction with her Elmiron - can exert a weak anticoagulant activity but this combination was tolerated, her CBC normal and no signs of bleeding. She was asked to follow up with the doctor that prescribed the Elmiron to see if an alternative could be found.   I saw her back in November - seemed to be doing ok but told me her one son in [place] was trying to have her declared demented and place her in an assisted living facility. She was quite upset by this. She still shops, does her own housework, cooks, [employer] and manages her medicines. She cares for another son with cancer.   She was cardioverted a month ago - did convert back to sinus rhythm.  Comes in today. Here alone today. She remains a little fatigued but overall feels better. No tachy palpitations. Not dizzy anymore. No chest pain. Not short of breath. Almost back to  all of her normal activities. Her legal issues are settling down - this has made her feel better. Some nausea with her Eliquis but she says she is able to tolerate as long as she eats a little snack with it.   Current Outpatient Prescriptions  Medication Sig Dispense Refill  . albuterol (PROVENTIL HFA;VENTOLIN HFA) 108 (90 BASE) MCG/ACT inhaler Inhale 2 puffs into the lungs every 6 (six) hours as needed for wheezing or shortness of breath.  1 Inhaler  2  . apixaban (ELIQUIS) 5 MG TABS tablet Take 0.5 tablets (2.5 mg total) by mouth 2 (two) times daily.  180 tablet  3  . Calcium Carbonate-Vitamin D (CALCIUM 600+D) 600-200 MG-UNIT TABS Take 1 tablet by mouth 2 (two) times daily.       . furosemide (LASIX) 40 MG tablet Take 40 mg by mouth daily.      . isosorbide mononitrate (IMDUR) 60 MG 24 hr tablet Take 1.5 tablets (90 mg total) by mouth daily. Pt request west-wards tabs  135 tablet  2  . lansoprazole (PREVACID) 30 MG capsule Take 1 capsule (30 mg total) by mouth 2 (two) times daily.  180 capsule  3  . Multiple Vitamin (MULTIVITAMIN) tablet Take 1 tablet by mouth daily.        . nitroGLYCERIN (NITROSTAT) 0.4 MG SL tablet Place 0.4 mg under the tongue every 5 (five) minutes as needed for chest pain. If pain after 3 pills- call  [phone]      . potassium chloride (K-DUR,KLOR-CON) 10 MEQ tablet Take 1 tablet (10 mEq total) by mouth daily.  90 tablet  3  . pravastatin (PRAVACHOL) 40 MG tablet TAKE 1 TABLET DAILY  90 tablet  3  . SYNTHROID 50 MCG tablet TAKE 1 TABLET DAILY  90 tablet  3  . valsartan (DIOVAN) 320 MG tablet Take 1 tablet (320 mg total) by mouth daily.  90 tablet  3   No current facility-administered medications for this visit.    Allergies  Allergen Reactions  . Codeine Hives and Nausea And Vomiting  . Sulfonamide Derivatives Swelling    Turns red    Past Medical History  Diagnosis Date  . HYPERLIPIDEMIA 03/07/2008  . HYPERTENSION 08/03/2007  . CORONARY ARTERY DISEASE     a. s/p PCI  to RCA '01. b. repeat PCI to RCA '03 2/2 ISR. c. cath 2012: RCA dz, rx medically.  . Acute on chronic systolic heart failure   . HYPOTHYROIDISM   . Esophageal reflux   . DIVERTICULOSIS, COLON   . BREAST MASS, BENIGN   . BARRETT'S ESOPHAGUS, HX OF   . Interstitial cystitis   . Arthritis   . Internal hemorrhoids   . Epistaxis   . Right hand fracture   . Bladder disorder   . Myocardial infarction   . Bradycardia 08/2012    a. Eval for symptomatic bradycardia 08/2012 - beta blocker discontinued.  . Orthostatic hypotension 08/2012    a. 08/2012 w/ syncope - meds adjusted.   . Atrial fibrillation     a. Dx 09/2013.  [name] LBBB (left bundle branch block)     Past Surgical History  Procedure Laterality Date  . Breast surgery      benign mass  . Cholecystectomy    . Cesarean section    . Appendectomy    . Cataract extraction, bilateral    . Tonsillectomy    . Abdominal hysterectomy    . Ankle reconstruction      Left  . Wrist reconstruction      Right  . Cardioversion N/A 10/20/2013    Procedure: CARDIOVERSION;  Surgeon: Rollene Rotunda, MD;  Location: [facility] ENDOSCOPY;  Service: Cardiovascular;  Laterality: N/A;    History  Smoking status  . Former Smoker -- 1.00 packs/day for 15 years  . Types: Cigarettes  . Quit date: 11/10/1969  Smokeless tobacco  . Never Used    History  Alcohol Use No    Family History  Problem Relation Age of Onset  . Colon cancer Mother   . Coronary artery disease Mother   . Heart disease Mother   . Coronary artery disease Father   . Heart disease Father   . Colon polyps Sister   . Coronary artery disease Sister   . Coronary artery disease Brother   . Diabetes type II Son     Review of Systems: The review of systems is per the HPI.  All other systems were reviewed and are negative.  Physical Exam: BP 120/74  Pulse 67  Ht 5\' 3"  (1.6 m)  Wt 150 lb (68.04 kg)  BMI 26.58 kg/m2 Patient is very pleasant and in no acute distress. Looks  younger than her stated age. Skin is warm and dry. Color is normal.  HEENT is unremarkable. Normocephalic/atraumatic. PERRL. Sclera are nonicteric. Neck is supple. No masses. No JVD. Lungs are clear. Cardiac exam shows a regular rate and rhythm. Abdomen is soft. Extremities are with ankle edema.  Gait and ROM are intact. No gross neurologic deficits noted.  Wt Readings from Last 3 Encounters:  11/21/13 150 lb (68.04 kg)  11/18/13 148 lb (67.132 kg)  10/20/13 146 lb (66.225 kg)     LABORATORY DATA: EKG today shows sinus - LBBB  Lab Results  Component Value Date   WBC 10.5 09/20/2013   HGB 12.5 09/20/2013   HCT 36.7 09/20/2013   PLT 223 09/20/2013   GLUCOSE 100* 11/18/2013   CHOL 153 02/14/2013   TRIG 130.0 02/14/2013   HDL 47.20 02/14/2013   LDLDIRECT 79.7 12/11/2010   LDLCALC 80 02/14/2013   ALT 22 09/20/2013   AST 20 09/20/2013   NA 142 11/18/2013   K 3.6 11/18/2013   CL 105 11/18/2013   CREATININE 1.3* 11/18/2013   BUN 18 11/18/2013   CO2 29 11/18/2013   TSH 0.850 09/21/2013   INR 1.0 01/27/2011   HGBA1C 6.5 11/18/2013    Echo Study Conclusions from November 2014  - Left ventricle: The cavity size was normal. Wall thickness was normal. Systolic function was mildly to moderately reduced. The estimated ejection fraction was in the range of 40% to 45%. Regional wall motion abnormalities cannot be excluded. Doppler parameters are consistent with a reversible restrictive pattern, indicative of decreased left ventricular diastolic compliance and/or increased left atrial pressure (grade 3 diastolic dysfunction). - Aortic valve: Trivial regurgitation. - Mitral valve: There is significant mitral annular calcification predominantly posteriorly. Posterior leaflet of the mitral valve is severely thickened and calcified. There is an echodensity attached to the posterior leaflet of the mitral valve measuring 17 x 8 mm that is partially mobile. It appears calcified. This might represent calcified leaflet  or a vegetation. If clinically indicated TEE is recommended. - Left atrium: The atrium was mildly dilated. - Atrial septum: No defect or patent foramen ovale was identified.    Assessment / Plan:  1. PAF - back in sinus - remains on her anticoagulation with Eliquis.   2. CAD - managed medically - no symptoms noted.   3. LBBB  4. Diastolic HF - seems compensated.  Overall, she seems to be doing well. See her back in 3 months.   Patient is agreeable to this plan and will call if any problems develop in the interim.   Rosalio Macadamia, RN, ANP-C [facility] [address] [place], [place]  [address] [phone]

## 2014-01-12 IMAGING — CR DG CHEST 2V
2 series · 2 of 2 positions shown · non-contrast
Comparison: 04/26/2013

CLINICAL DATA: Shortness of breath, cough, congestion.

CHEST - 2 VIEW

[w chest pa]
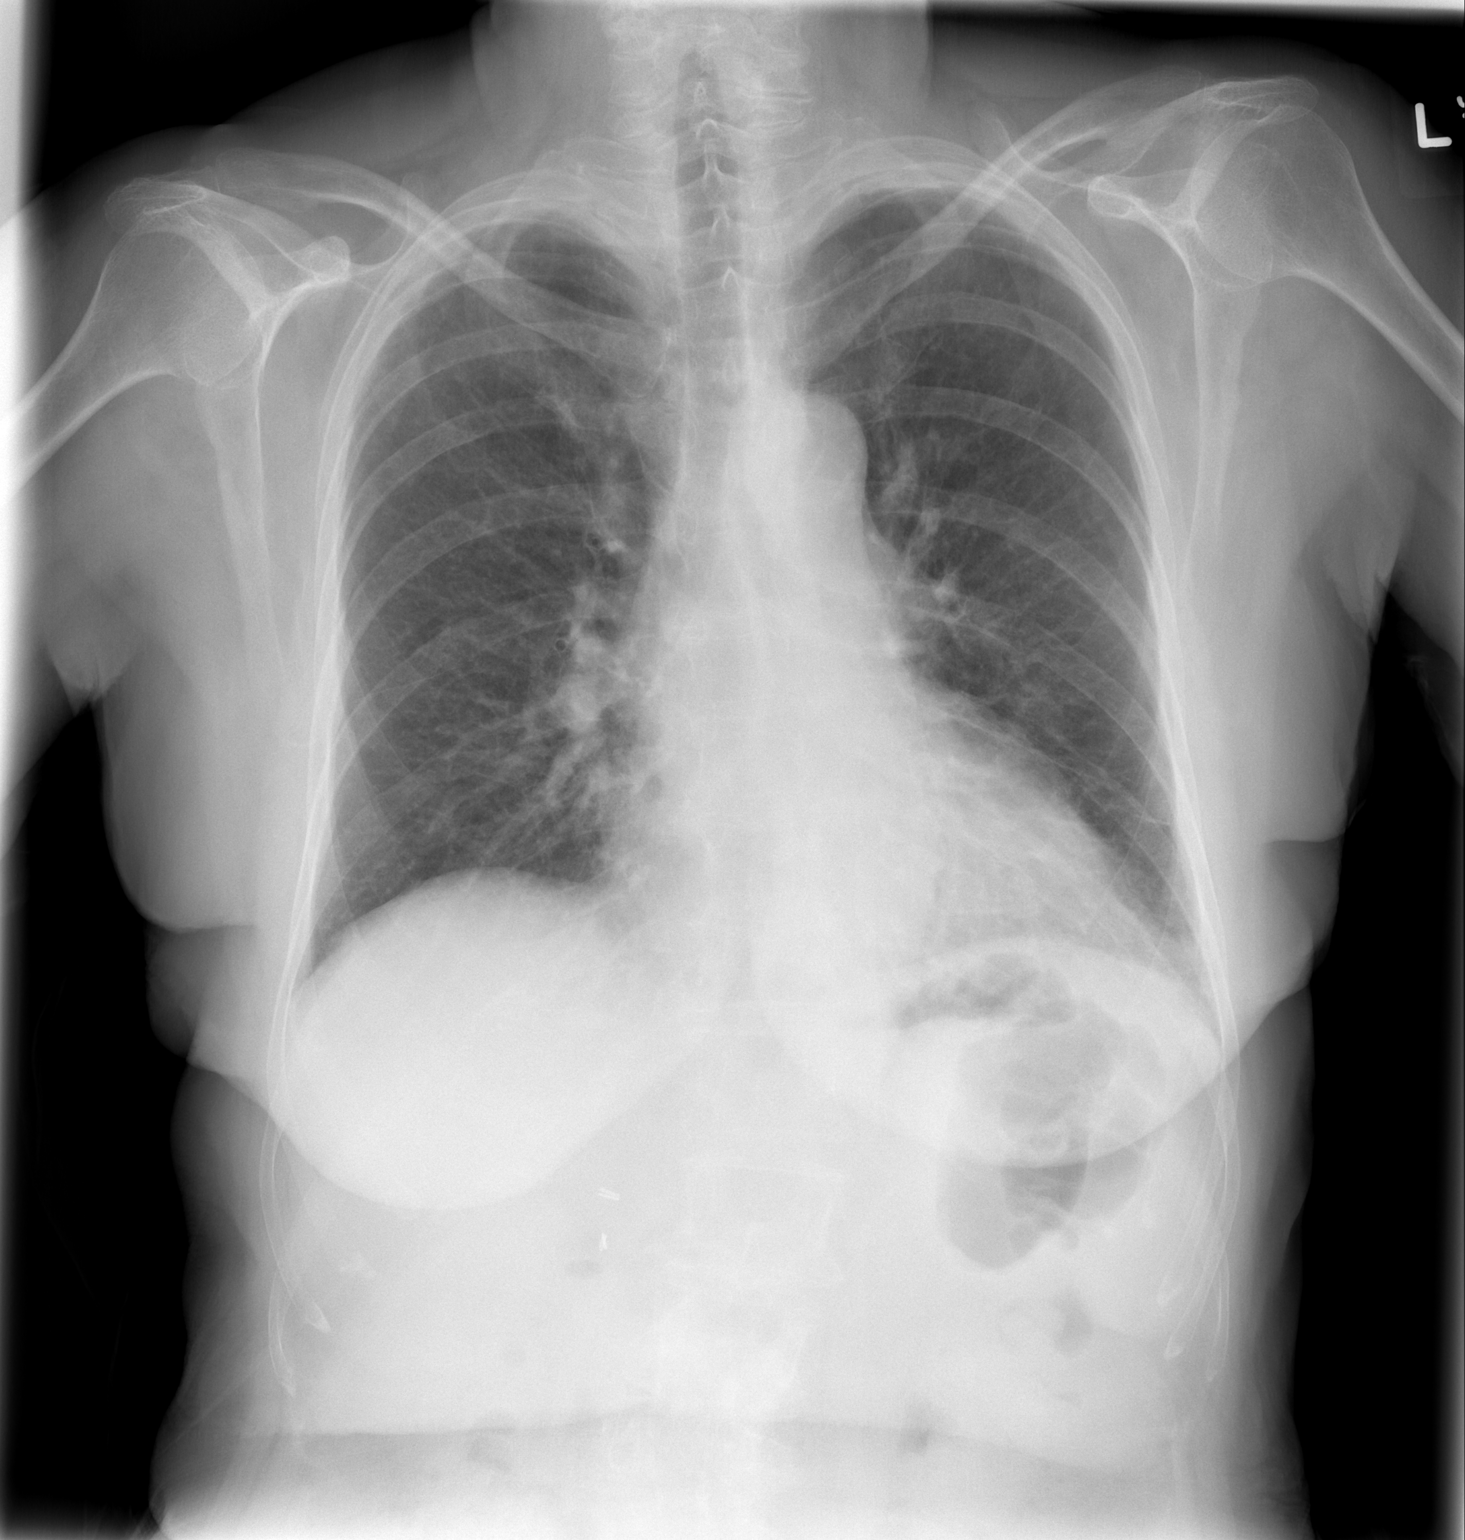

[w chest lat]
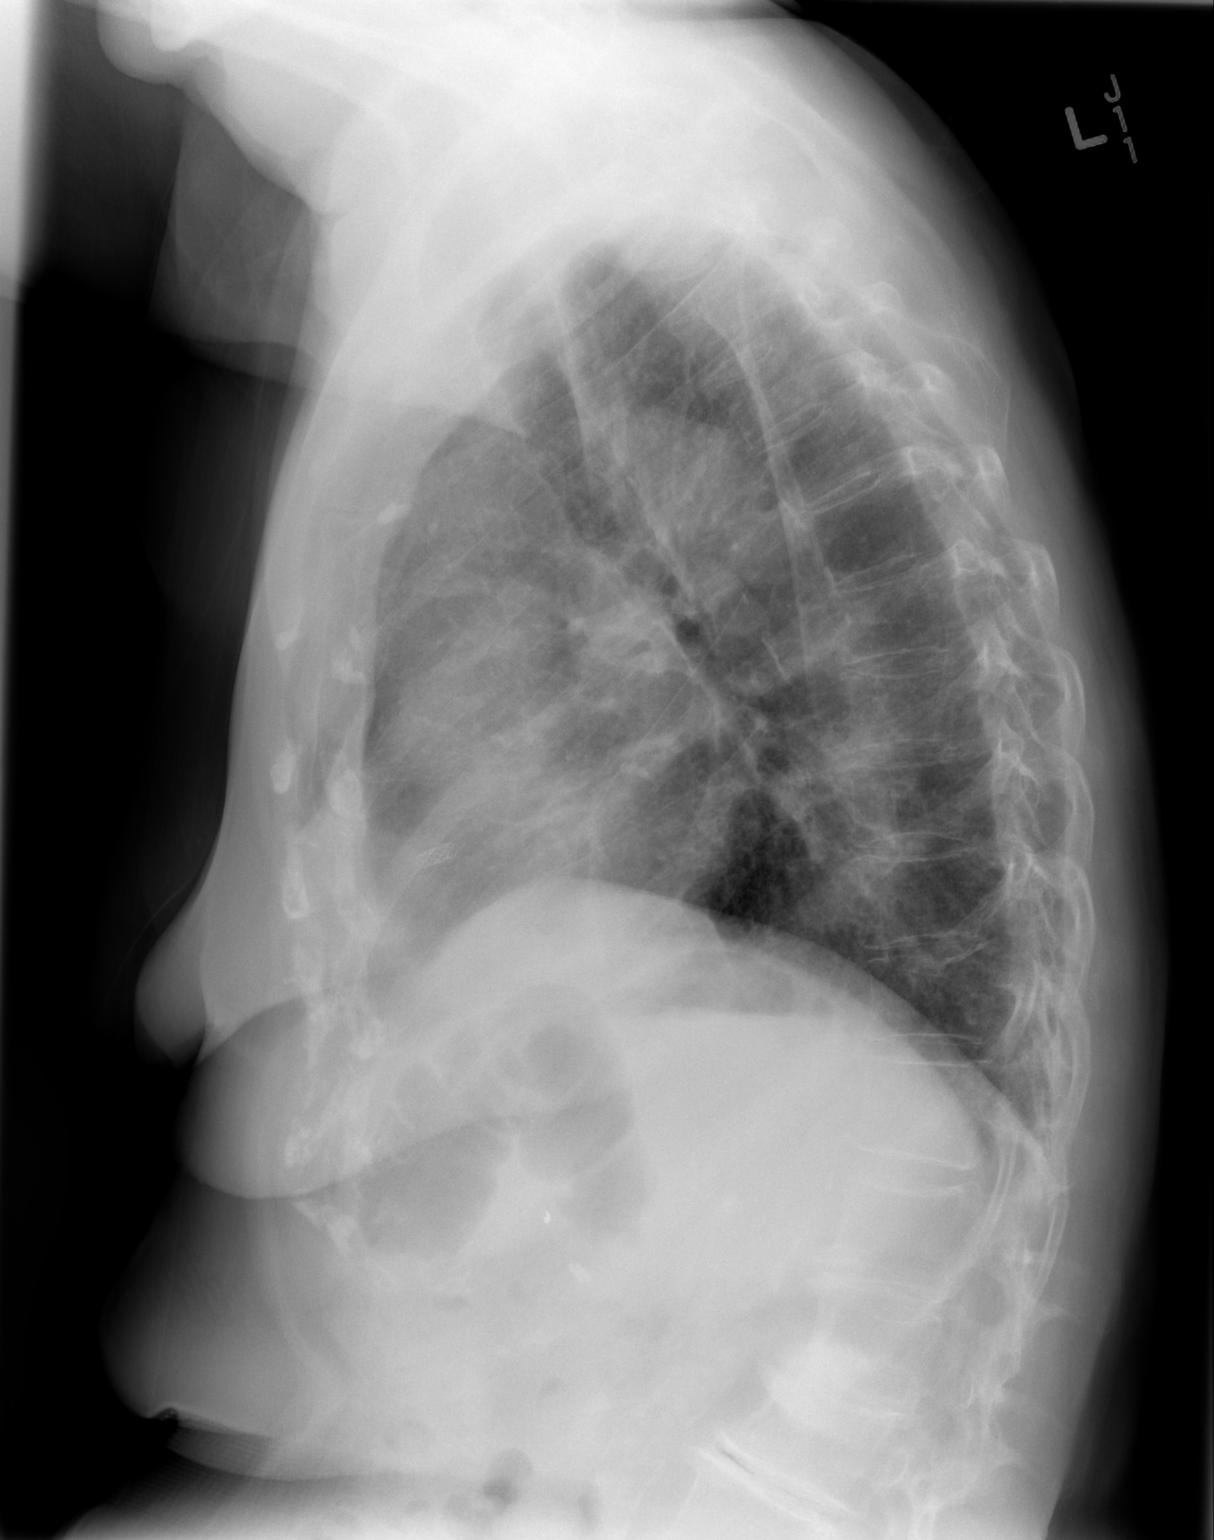

[2 of 2 positions shown; findings below may reference images not displayed]

FINDINGS: Heart is upper limits normal in size.  Biapical scarring.
Mild linear densities in the lingula, likely atelectasis or
scarring.  Mild peribronchial thickening.  No confluent opacities
or effusions.  No acute bony abnormality.
IMPRESSION: Stable chronic changes.  Mild bronchitic changes.

## 2014-03-02 DIAGNOSIS — H35379 Puckering of macula, unspecified eye: Secondary | ICD-10-CM | POA: Diagnosis not present

## 2014-03-02 DIAGNOSIS — H524 Presbyopia: Secondary | ICD-10-CM | POA: Diagnosis not present

## 2014-03-02 DIAGNOSIS — H35369 Drusen (degenerative) of macula, unspecified eye: Secondary | ICD-10-CM | POA: Diagnosis not present

## 2014-03-02 DIAGNOSIS — H1045 Other chronic allergic conjunctivitis: Secondary | ICD-10-CM | POA: Diagnosis not present

## 2014-03-09 ENCOUNTER — Ambulatory Visit (INDEPENDENT_AMBULATORY_CARE_PROVIDER_SITE_OTHER): Payer: Medicare Other | Admitting: Cardiology

## 2014-03-09 ENCOUNTER — Encounter: Payer: Self-pay | Admitting: Cardiology

## 2014-03-09 VITALS — BP 137/68 | HR 71 | Ht 63.0 in | Wt 153.1 lb

## 2014-03-09 DIAGNOSIS — I4891 Unspecified atrial fibrillation: Secondary | ICD-10-CM | POA: Diagnosis not present

## 2014-03-09 NOTE — Progress Notes (Signed)
HPI Patient presents for followup up of atrial fibrillation.  She did have an echocardiogram with her EF being slightly reduced at 40-45% with some calcium on her mitral valve. She is on a renally adjusted dose of Eliquis.  She did have cardioversion and has had no documented tachyarrhythmia since that time.  The patient denies any new symptoms such as chest discomfort, neck or arm discomfort. There has been no new shortness of breath, PND or orthopnea. There have been no reported palpitations, presyncope or syncope.  She is sleeping quite a bit.  Her biggest complain her symptoms related to her cystitis. She says she was taken off of Elmiron while she was in the hospital because of possible interaction with Eliquis.  She would very much like to restart this medication.   Allergies  Allergen Reactions  . Codeine Hives and Nausea And Vomiting  . Sulfonamide Derivatives Swelling    Turns red    Current Outpatient Prescriptions  Medication Sig Dispense Refill  . albuterol (PROVENTIL HFA;VENTOLIN HFA) 108 (90 BASE) MCG/ACT inhaler Inhale 2 puffs into the lungs every 6 (six) hours as needed for wheezing or shortness of breath.  1 Inhaler  2  . apixaban (ELIQUIS) 5 MG TABS tablet Take 0.5 tablets (2.5 mg total) by mouth 2 (two) times daily.  180 tablet  3  . Calcium Carbonate-Vitamin D (CALCIUM 600+D) 600-200 MG-UNIT TABS Take 1 tablet by mouth 2 (two) times daily.       . furosemide (LASIX) 40 MG tablet Take 40 mg by mouth daily.      . isosorbide mononitrate (IMDUR) 60 MG 24 hr tablet Take 1.5 tablets (90 mg total) by mouth daily. Pt request west-wards tabs  135 tablet  2  . Multiple Vitamin (MULTIVITAMIN) tablet Take 1 tablet by mouth daily.        . nitroGLYCERIN (NITROSTAT) 0.4 MG SL tablet Place 0.4 mg under the tongue every 5 (five) minutes as needed for chest pain. If pain after 3 pills- call 911      . potassium chloride (K-DUR,KLOR-CON) 10 MEQ tablet Take 1 tablet (10 mEq total) by mouth  daily.  90 tablet  3  . pravastatin (PRAVACHOL) 40 MG tablet TAKE 1 TABLET DAILY  90 tablet  3  . SYNTHROID 50 MCG tablet TAKE 1 TABLET DAILY  90 tablet  3  . valsartan (DIOVAN) 320 MG tablet Take 1 tablet (320 mg total) by mouth daily.  90 tablet  3  . lansoprazole (PREVACID) 30 MG capsule Take 1 capsule (30 mg total) by mouth 2 (two) times daily.  180 capsule  3   No current facility-administered medications for this visit.    Past Medical History  Diagnosis Date  . HYPERLIPIDEMIA 03/07/2008  . HYPERTENSION 08/03/2007  . CORONARY ARTERY DISEASE     a. s/p PCI to RCA '01. b. repeat PCI to RCA '03 2/2 ISR. c. cath 2012: RCA dz, rx medically.  . Acute on chronic systolic heart failure   . HYPOTHYROIDISM   . Esophageal reflux   . DIVERTICULOSIS, COLON   . BREAST MASS, BENIGN   . BARRETT'S ESOPHAGUS, HX OF   . Interstitial cystitis   . Arthritis   . Internal hemorrhoids   . Epistaxis   . Right hand fracture   . Bladder disorder   . Myocardial infarction   . Bradycardia 08/2012    a. Eval for symptomatic bradycardia 08/2012 - beta blocker discontinued.  . Orthostatic hypotension 08/2012  a. 08/2012 w/ syncope - meds adjusted.   . Atrial fibrillation     a. Dx 09/2013.  [name]. LBBB (left bundle branch block)     Past Surgical History  Procedure Laterality Date  . Breast surgery      benign mass  . Cholecystectomy    . Cesarean section    . Appendectomy    . Cataract extraction, bilateral    . Tonsillectomy    . Abdominal hysterectomy    . Ankle reconstruction      Left  . Wrist reconstruction      Right  . Cardioversion N/A 10/20/2013    Procedure: CARDIOVERSION;  Surgeon: Rollene RotundaJames Daviyon Widmayer, MD;  [facility];  Service: Cardiovascular;  Laterality: N/A;    ROS:  As stated in the HPI and negative for all other systems.  PHYSICAL EXAM BP 137/68  Pulse 71  Ht 5\' 3"  (1.6 m)  Wt 153 lb 1.9 oz (69.455 kg)  BMI 27.13 kg/m2 GENERAL:  Well appearing HEENT:  Pupils  equal round and reactive, fundi not visualized, oral mucosa unremarkable NECK:  No jugular venous distention, waveform within normal limits, carotid upstroke brisk and symmetric, no bruits, no thyromegaly LUNGS:  Decreased breath sounds in the left lung base with few basilar crackles. BACK:  No CVA tenderness CHEST: Unremarkable HEART:  PMI not displaced or sustained,S1 and S2 within normal limits, no S3, no s4 for her, no clicks, no rubs, no murmurs ABD:  Flat, positive bowel sounds normal in frequency in pitch, no bruits, no rebound, no guarding, no midline pulsatile mass, no hepatomegaly, no splenomegaly EXT:  2 plus pulses throughout, mild right greater than left edema, no cyanosis no clubbing  EKG:  Normal sinus rhythm, left bundle branch block, left axis deviation.  03/09/2014  ASSESSMENT AND PLAN  ATRIAL FIB -  She is maintaining NSR.  However, I would like for her to continue her NOAC.  She was concerned because she was told that she cannot take Elmiron for her cystitis while taking a blood thinner.  However, we reviewed this and I went over it with our pharmacist and we reviewed the package insert.  Although there is a heperanoid this should have a local effect not a systemic effect and can be used along with the Eliquis.  I spoke with Dr. Annabell HowellsWrenn and he will restart this medication and we will follow her blood counts closely.    CORONARY ARTERY DISEASE -  She is having no ongoing symptoms.  No change in therapy is planned.    HYPERTENSION -  Her blood pressure is well controlled. She will continue the meds as listed.  Diastolic HF (heart failure) - She seems to be euvolemic. No change in therapy is indicated.  No further cardiovascular testing is indicated.

## 2014-03-09 NOTE — Patient Instructions (Signed)
The current medical regimen is effective;  continue present plan and medications.  Follow up in 6 months with Dr Hochrein.  You will receive a letter in the mail 2 months before you are due.  Please call us when you receive this letter to schedule your follow up appointment.  

## 2014-03-15 ENCOUNTER — Other Ambulatory Visit: Payer: Self-pay | Admitting: Internal Medicine

## 2014-03-31 DIAGNOSIS — N3010 Interstitial cystitis (chronic) without hematuria: Secondary | ICD-10-CM | POA: Diagnosis not present

## 2014-04-07 DIAGNOSIS — N3010 Interstitial cystitis (chronic) without hematuria: Secondary | ICD-10-CM | POA: Diagnosis not present

## 2014-04-12 ENCOUNTER — Other Ambulatory Visit: Payer: Self-pay | Admitting: Internal Medicine

## 2014-04-19 ENCOUNTER — Ambulatory Visit: Payer: Medicare Other | Admitting: Internal Medicine

## 2014-04-21 ENCOUNTER — Encounter: Payer: Self-pay | Admitting: Internal Medicine

## 2014-04-21 ENCOUNTER — Ambulatory Visit (INDEPENDENT_AMBULATORY_CARE_PROVIDER_SITE_OTHER): Payer: Medicare Other | Admitting: Internal Medicine

## 2014-04-21 VITALS — BP 118/70 | HR 64 | Temp 98.2°F | Resp 20 | Ht 63.0 in | Wt 151.0 lb

## 2014-04-21 DIAGNOSIS — E785 Hyperlipidemia, unspecified: Secondary | ICD-10-CM | POA: Diagnosis not present

## 2014-04-21 DIAGNOSIS — IMO0002 Reserved for concepts with insufficient information to code with codable children: Secondary | ICD-10-CM | POA: Diagnosis not present

## 2014-04-21 DIAGNOSIS — K219 Gastro-esophageal reflux disease without esophagitis: Secondary | ICD-10-CM

## 2014-04-21 DIAGNOSIS — M5416 Radiculopathy, lumbar region: Secondary | ICD-10-CM

## 2014-04-21 DIAGNOSIS — I5030 Unspecified diastolic (congestive) heart failure: Secondary | ICD-10-CM

## 2014-04-21 DIAGNOSIS — I4891 Unspecified atrial fibrillation: Secondary | ICD-10-CM | POA: Diagnosis not present

## 2014-04-21 DIAGNOSIS — T887XXA Unspecified adverse effect of drug or medicament, initial encounter: Secondary | ICD-10-CM

## 2014-04-21 LAB — CBC WITH DIFFERENTIAL/PLATELET
Basophils Absolute: 0 10*3/uL (ref 0.0–0.1)
Basophils Relative: 0.5 % (ref 0.0–3.0)
Eosinophils Absolute: 0 10*3/uL (ref 0.0–0.7)
Eosinophils Relative: 0.6 % (ref 0.0–5.0)
HEMATOCRIT: 36.9 % (ref 36.0–46.0)
Hemoglobin: 12.3 g/dL (ref 12.0–15.0)
LYMPHS ABS: 1.4 10*3/uL (ref 0.7–4.0)
Lymphocytes Relative: 24.9 % (ref 12.0–46.0)
MCHC: 33.3 g/dL (ref 30.0–36.0)
MCV: 84.6 fl (ref 78.0–100.0)
MONO ABS: 0.4 10*3/uL (ref 0.1–1.0)
Monocytes Relative: 7.2 % (ref 3.0–12.0)
Neutro Abs: 3.6 10*3/uL (ref 1.4–7.7)
Neutrophils Relative %: 66.8 % (ref 43.0–77.0)
Platelets: 213 10*3/uL (ref 150.0–400.0)
RBC: 4.36 Mil/uL (ref 3.87–5.11)
RDW: 14.3 % (ref 11.5–15.5)
WBC: 5.4 10*3/uL (ref 4.0–10.5)

## 2014-04-21 LAB — BASIC METABOLIC PANEL
BUN: 20 mg/dL (ref 6–23)
CALCIUM: 9.5 mg/dL (ref 8.4–10.5)
CHLORIDE: 106 meq/L (ref 96–112)
CO2: 27 mEq/L (ref 19–32)
CREATININE: 1.2 mg/dL (ref 0.4–1.2)
GFR: 43.86 mL/min — ABNORMAL LOW (ref >60.00)
Glucose, Bld: 90 mg/dL (ref 70–99)
Potassium: 3.6 mEq/L (ref 3.5–5.1)
Sodium: 142 mEq/L (ref 135–145)

## 2014-04-21 LAB — TSH: TSH: 0.31 u[IU]/mL — AB (ref 0.35–4.50)

## 2014-04-21 MED ORDER — NITROGLYCERIN 0.4 MG SL SUBL: 0.4000 mg | SUBLINGUAL_TABLET | SUBLINGUAL | Status: DC | PRN

## 2014-04-21 MED ORDER — LANSOPRAZOLE 30 MG PO CPDR: 30.0000 mg | DELAYED_RELEASE_CAPSULE | Freq: Two times a day (BID) | ORAL | Status: DC

## 2014-04-21 MED ORDER — FUROSEMIDE 40 MG PO TABS: ORAL_TABLET | ORAL | Status: DC

## 2014-04-21 MED ORDER — LEVOTHYROXINE SODIUM 50 MCG PO TABS: ORAL_TABLET | ORAL | Status: DC

## 2014-04-21 NOTE — Patient Instructions (Signed)
[name] in 3 months

## 2014-04-21 NOTE — Progress Notes (Signed)
Pre-visit discussion using our clinic review tool. No additional management support is needed unless otherwise documented below in the visit note.  

## 2014-04-21 NOTE — Progress Notes (Addendum)
   Subjective:    Patient ID: Victoria Gomez, female    DOB: 29-Mar-1925, 80 y.o.   MRN: [id number]  HPI Follow up with Dr Kirtland BouchardK after today Stable COPD, hypothyroid and CAD with HTN    Review of Systems  Constitutional: Positive for fatigue. Negative for activity change and appetite change.  HENT: Negative for congestion, ear pain, postnasal drip and sinus pressure.   Eyes: Negative for redness and visual disturbance.  Respiratory: Negative for cough, shortness of breath and wheezing.   Cardiovascular: Positive for leg swelling.  Gastrointestinal: Negative for abdominal pain and abdominal distention.  Genitourinary: Positive for frequency. Negative for dysuria and menstrual problem.  Musculoskeletal: Positive for arthralgias and joint swelling. Negative for myalgias and neck pain.  Skin: Negative for rash and wound.  Neurological: Positive for weakness. Negative for dizziness and headaches.  Hematological: Negative for adenopathy. Does not bruise/bleed easily.  Psychiatric/Behavioral: Negative for sleep disturbance and decreased concentration.       Objective:   Physical Exam  Nursing note and vitals reviewed. Constitutional: She appears well-developed and well-nourished. No distress.  HENT:  Head: Normocephalic and atraumatic.  Eyes: Conjunctivae and EOM are normal. Pupils are equal, round, and reactive to light.  Neck: Normal range of motion. Neck supple. No JVD present. No tracheal deviation present. No thyromegaly present.  Cardiovascular: Normal rate and regular rhythm.   Murmur heard. Edema in feet  Pulmonary/Chest: Effort normal and breath sounds normal. She has no wheezes. She exhibits no tenderness.  Abdominal: Soft. Bowel sounds are normal.  Musculoskeletal: Normal range of motion. She exhibits no edema and no tenderness.  Lymphadenopathy:    She has no cervical adenopathy.  Neurological: She has normal reflexes. No cranial nerve deficit.  Skin: Skin is warm and dry.  She is not diaphoretic.  Psychiatric: She has a normal mood and affect. Her behavior is normal.          Assessment & Plan:  Stable CAD  With moderate DOE that is unchanged Cardiology follows NTG one dose works well Has  CRI  Creatinine of 1.3 with GFR or 43 A1c was 6.5   Needs to walk every day and kee weight in check  Stage 3 renal failure

## 2014-05-10 ENCOUNTER — Emergency Department (HOSPITAL_COMMUNITY): Payer: Medicare Other

## 2014-05-10 ENCOUNTER — Encounter (HOSPITAL_COMMUNITY): Payer: Self-pay | Admitting: Emergency Medicine

## 2014-05-10 ENCOUNTER — Emergency Department (HOSPITAL_COMMUNITY)
Admission: EM | Admit: 2014-05-10 | Discharge: 2014-05-10 | Disposition: A | Discharge status: Home / Self Care | Payer: Medicare Other | Attending: Emergency Medicine | Admitting: Emergency Medicine

## 2014-05-10 DIAGNOSIS — W1809XA Striking against other object with subsequent fall, initial encounter: Secondary | ICD-10-CM

## 2014-05-10 DIAGNOSIS — Z79899 Other long term (current) drug therapy: Secondary | ICD-10-CM | POA: Diagnosis not present

## 2014-05-10 DIAGNOSIS — I252 Old myocardial infarction: Secondary | ICD-10-CM | POA: Insufficient documentation

## 2014-05-10 DIAGNOSIS — Z87891 Personal history of nicotine dependence: Secondary | ICD-10-CM | POA: Diagnosis not present

## 2014-05-10 DIAGNOSIS — Z9889 Other specified postprocedural states: Secondary | ICD-10-CM | POA: Diagnosis not present

## 2014-05-10 DIAGNOSIS — I2510 Atherosclerotic heart disease of native coronary artery without angina pectoris: Secondary | ICD-10-CM | POA: Diagnosis not present

## 2014-05-10 DIAGNOSIS — R079 Chest pain, unspecified: Secondary | ICD-10-CM | POA: Diagnosis not present

## 2014-05-10 DIAGNOSIS — S99919A Unspecified injury of unspecified ankle, initial encounter: Secondary | ICD-10-CM | POA: Diagnosis not present

## 2014-05-10 DIAGNOSIS — S20211A Contusion of right front wall of thorax, initial encounter: Secondary | ICD-10-CM

## 2014-05-10 DIAGNOSIS — Z7902 Long term (current) use of antithrombotics/antiplatelets: Secondary | ICD-10-CM | POA: Insufficient documentation

## 2014-05-10 DIAGNOSIS — S4980XA Other specified injuries of shoulder and upper arm, unspecified arm, initial encounter: Secondary | ICD-10-CM | POA: Diagnosis not present

## 2014-05-10 DIAGNOSIS — M129 Arthropathy, unspecified: Secondary | ICD-10-CM | POA: Diagnosis not present

## 2014-05-10 DIAGNOSIS — Z8781 Personal history of (healed) traumatic fracture: Secondary | ICD-10-CM | POA: Insufficient documentation

## 2014-05-10 DIAGNOSIS — I10 Essential (primary) hypertension: Secondary | ICD-10-CM | POA: Diagnosis not present

## 2014-05-10 DIAGNOSIS — Z8742 Personal history of other diseases of the female genital tract: Secondary | ICD-10-CM | POA: Diagnosis not present

## 2014-05-10 DIAGNOSIS — S8010XA Contusion of unspecified lower leg, initial encounter: Secondary | ICD-10-CM | POA: Insufficient documentation

## 2014-05-10 DIAGNOSIS — S40029A Contusion of unspecified upper arm, initial encounter: Secondary | ICD-10-CM | POA: Insufficient documentation

## 2014-05-10 DIAGNOSIS — S298XXA Other specified injuries of thorax, initial encounter: Secondary | ICD-10-CM | POA: Diagnosis not present

## 2014-05-10 DIAGNOSIS — E785 Hyperlipidemia, unspecified: Secondary | ICD-10-CM | POA: Insufficient documentation

## 2014-05-10 DIAGNOSIS — S46909A Unspecified injury of unspecified muscle, fascia and tendon at shoulder and upper arm level, unspecified arm, initial encounter: Secondary | ICD-10-CM | POA: Diagnosis not present

## 2014-05-10 DIAGNOSIS — M25569 Pain in unspecified knee: Secondary | ICD-10-CM | POA: Diagnosis not present

## 2014-05-10 DIAGNOSIS — I5023 Acute on chronic systolic (congestive) heart failure: Secondary | ICD-10-CM | POA: Insufficient documentation

## 2014-05-10 DIAGNOSIS — K219 Gastro-esophageal reflux disease without esophagitis: Secondary | ICD-10-CM | POA: Insufficient documentation

## 2014-05-10 DIAGNOSIS — E039 Hypothyroidism, unspecified: Secondary | ICD-10-CM | POA: Diagnosis not present

## 2014-05-10 DIAGNOSIS — M25529 Pain in unspecified elbow: Secondary | ICD-10-CM | POA: Diagnosis not present

## 2014-05-10 DIAGNOSIS — S99929A Unspecified injury of unspecified foot, initial encounter: Secondary | ICD-10-CM | POA: Diagnosis not present

## 2014-05-10 DIAGNOSIS — S20219A Contusion of unspecified front wall of thorax, initial encounter: Secondary | ICD-10-CM | POA: Insufficient documentation

## 2014-05-10 DIAGNOSIS — Y9389 Activity, other specified: Secondary | ICD-10-CM | POA: Insufficient documentation

## 2014-05-10 DIAGNOSIS — W010XXA Fall on same level from slipping, tripping and stumbling without subsequent striking against object, initial encounter: Secondary | ICD-10-CM | POA: Insufficient documentation

## 2014-05-10 DIAGNOSIS — S8990XA Unspecified injury of unspecified lower leg, initial encounter: Secondary | ICD-10-CM | POA: Diagnosis not present

## 2014-05-10 DIAGNOSIS — Y92009 Unspecified place in unspecified non-institutional (private) residence as the place of occurrence of the external cause: Secondary | ICD-10-CM | POA: Insufficient documentation

## 2014-05-10 LAB — URINALYSIS, ROUTINE W REFLEX MICROSCOPIC
Bilirubin Urine: NEGATIVE
GLUCOSE, UA: NEGATIVE mg/dL
Ketones, ur: NEGATIVE mg/dL
Leukocytes, UA: NEGATIVE
Nitrite: NEGATIVE
Protein, ur: NEGATIVE mg/dL
SPECIFIC GRAVITY, URINE: 1.008 (ref 1.005–1.030)
Urobilinogen, UA: 0.2 mg/dL (ref 0.0–1.0)
pH: 5 (ref 5.0–8.0)

## 2014-05-10 LAB — COMPREHENSIVE METABOLIC PANEL
ALBUMIN: 3.7 g/dL (ref 3.5–5.2)
ALK PHOS: 93 U/L (ref 39–117)
ALT: 10 U/L (ref 0–35)
AST: 18 U/L (ref 0–37)
BILIRUBIN TOTAL: 0.3 mg/dL (ref 0.3–1.2)
BUN: 20 mg/dL (ref 6–23)
CHLORIDE: 103 meq/L (ref 96–112)
CO2: 25 meq/L (ref 19–32)
Calcium: 9 mg/dL (ref 8.4–10.5)
Creatinine, Ser: 0.97 mg/dL (ref 0.50–1.10)
GFR calc Af Amer: 59 mL/min — ABNORMAL LOW (ref >90)
GFR, EST NON AFRICAN AMERICAN: 51 mL/min — AB (ref >90)
Glucose, Bld: 118 mg/dL — ABNORMAL HIGH (ref 70–99)
POTASSIUM: 3.6 meq/L — AB (ref 3.7–5.3)
Sodium: 141 mEq/L (ref 137–147)
Total Protein: 7 g/dL (ref 6.0–8.3)

## 2014-05-10 LAB — PROTIME-INR
INR: 1.16 (ref 0.00–1.49)
Prothrombin Time: 14.8 seconds (ref 11.6–15.2)

## 2014-05-10 LAB — CBC WITH DIFFERENTIAL/PLATELET
BASOS PCT: 0 % (ref 0–1)
Basophils Absolute: 0 10*3/uL (ref 0.0–0.1)
Eosinophils Absolute: 0.1 10*3/uL (ref 0.0–0.7)
Eosinophils Relative: 1 % (ref 0–5)
HCT: 35.1 % — ABNORMAL LOW (ref 36.0–46.0)
Hemoglobin: 11.5 g/dL — ABNORMAL LOW (ref 12.0–15.0)
Lymphocytes Relative: 24 % (ref 12–46)
Lymphs Abs: 1.5 10*3/uL (ref 0.7–4.0)
MCH: 27.7 pg (ref 26.0–34.0)
MCHC: 32.8 g/dL (ref 30.0–36.0)
MCV: 84.6 fL (ref 78.0–100.0)
MONOS PCT: 7 % (ref 3–12)
Monocytes Absolute: 0.4 10*3/uL (ref 0.1–1.0)
NEUTROS ABS: 4.1 10*3/uL (ref 1.7–7.7)
NEUTROS PCT: 68 % (ref 43–77)
PLATELETS: 185 10*3/uL (ref 150–400)
RBC: 4.15 MIL/uL (ref 3.87–5.11)
RDW: 13.7 % (ref 11.5–15.5)
WBC: 6.1 10*3/uL (ref 4.0–10.5)

## 2014-05-10 LAB — APTT: aPTT: 37 seconds (ref 24–37)

## 2014-05-10 LAB — URINE MICROSCOPIC-ADD ON

## 2014-05-10 MED ORDER — ONDANSETRON 4 MG PO TBDP
4.0000 mg | ORAL_TABLET | Freq: Once | ORAL | Status: AC
  Administered 2014-05-10: 4 mg via ORAL
  Filled 2014-05-10: qty 1

## 2014-05-10 MED ORDER — HYDROCODONE-ACETAMINOPHEN 5-325 MG PO TABS: 1.0000 | ORAL_TABLET | Freq: Four times a day (QID) | ORAL | Status: DC | PRN

## 2014-05-10 MED ORDER — FENTANYL CITRATE 0.05 MG/ML IJ SOLN
50.0000 ug | Freq: Once | INTRAMUSCULAR | Status: AC
  Administered 2014-05-10: 50 ug via INTRAMUSCULAR
  Filled 2014-05-10: qty 2

## 2014-05-10 NOTE — ED Notes (Signed)
Respiratory in with patient.

## 2014-05-10 NOTE — ED Notes (Signed)
Provided pt with ice pack for home

## 2014-05-10 NOTE — Discharge Instructions (Signed)
Chest Contusion A chest contusion is a deep bruise on your chest area. Contusions are the result of an injury that caused bleeding under the skin. A chest contusion may involve bruising of the skin, muscles, or ribs. The contusion may turn blue, purple, or yellow. Minor injuries will give you a painless contusion, but more severe contusions may stay painful and swollen for a few weeks. CAUSES  A contusion is usually caused by a blow, trauma, or direct force to an area of the body. SYMPTOMS   Swelling and redness of the injured area.  Discoloration of the injured area.  Tenderness and soreness of the injured area.  Pain. DIAGNOSIS  The diagnosis can be made by taking a history and performing a physical exam. An X-ray, CT scan, or MRI may be needed to determine if there were any associated injuries, such as broken bones (fractures) or internal injuries. TREATMENT  Often, the best treatment for a chest contusion is resting, icing, and applying cold compresses to the injured area. Deep breathing exercises may be recommended to reduce the risk of pneumonia. Over-the-counter medicines may also be recommended for pain control. HOME CARE INSTRUCTIONS   Put ice on the injured area.  Put ice in a plastic bag.  Place a towel between your skin and the bag.  Leave the ice on for 15-20 minutes, 03-04 times a day.  Only take over-the-counter or prescription medicines as directed by your caregiver. Your caregiver may recommend avoiding anti-inflammatory medicines (aspirin, ibuprofen, and naproxen) for 48 hours because these medicines may increase bruising.  Rest the injured area.  Perform deep-breathing exercises as directed by your caregiver.  Stop smoking if you smoke.  Do not lift objects over 5 pounds (2.3 kg) for 3 days or longer if recommended by your caregiver. SEEK IMMEDIATE MEDICAL CARE IF:   You have increased bruising or swelling.  You have pain that is getting worse.  You have  difficulty breathing.  You have dizziness, weakness, or fainting.  You have blood in your urine or stool.  You cough up or vomit blood.  Your swelling or pain is not relieved with medicines. MAKE SURE YOU:   Understand these instructions.  Will watch your condition.  Will get help right away if you are not doing well or get worse. Document Released: 07/22/2001 Document Revised: 07/21/2012 Document Reviewed: 04/19/2012 [facility] SurgeryExitCare Patient Information 2015 [place], [place]. This information is not intended to replace advice given to you by your health care provider. Make sure you discuss any questions you have with your health care provider.  Fall Prevention and Home Safety Falls cause injuries and can affect all age groups. It is possible to use preventive measures to significantly decrease the likelihood of falls. There are many simple measures which can make your home safer and prevent falls. OUTDOORS  Repair cracks and edges of walkways and driveways.  Remove high doorway thresholds.  Trim shrubbery on the main path into your home.  Have good outside lighting.  Clear walkways of tools, rocks, debris, and clutter.  Check that handrails are not broken and are securely fastened. Both sides of steps should have handrails.  Have leaves, snow, and ice cleared regularly.  Use sand or salt on walkways during winter months.  In the garage, clean up grease or oil spills. BATHROOM  Install night lights.  Install grab bars by the toilet and in the tub and shower.  Use non-skid mats or decals in the tub or shower.  Place a  plastic non-slip stool in the shower to sit on, if needed.  Keep floors dry and clean up all water on the floor immediately.  Remove soap buildup in the tub or shower on a regular basis.  Secure bath mats with non-slip, double-sided rug tape.  Remove throw rugs and tripping hazards from the floors. BEDROOMS  Install night lights.  Make sure a bedside  light is easy to reach.  Do not use oversized bedding.  Keep a telephone by your bedside.  Have a firm chair with side arms to use for getting dressed.  Remove throw rugs and tripping hazards from the floor. KITCHEN  Keep handles on pots and pans turned toward the center of the stove. Use back burners when possible.  Clean up spills quickly and allow time for drying.  Avoid walking on wet floors.  Avoid hot utensils and knives.  Position shelves so they are not too high or low.  Place commonly used objects within easy reach.  If necessary, use a sturdy step stool with a grab bar when reaching.  Keep electrical cables out of the way.  Do not use floor polish or wax that makes floors slippery. If you must use wax, use non-skid floor wax.  Remove throw rugs and tripping hazards from the floor. STAIRWAYS  Never leave objects on stairs.  Place handrails on both sides of stairways and use them. Fix any loose handrails. Make sure handrails on both sides of the stairways are as long as the stairs.  Check carpeting to make sure it is firmly attached along stairs. Make repairs to worn or loose carpet promptly.  Avoid placing throw rugs at the top or bottom of stairways, or properly secure the rug with carpet tape to prevent slippage. Get rid of throw rugs, if possible.  Have an electrician put in a light switch at the top and bottom of the stairs. OTHER FALL PREVENTION TIPS  Wear low-heel or rubber-soled shoes that are supportive and fit well. Wear closed toe shoes.  When using a stepladder, make sure it is fully opened and both spreaders are firmly locked. Do not climb a closed stepladder.  Add color or contrast paint or tape to grab bars and handrails in your home. Place contrasting color strips on first and last steps.  Learn and use mobility aids as needed. Install an electrical emergency response system.  Turn on lights to avoid dark areas. Replace light bulbs that burn  out immediately. Get light switches that glow.  Arrange furniture to create clear pathways. Keep furniture in the same place.  Firmly attach carpet with non-skid or double-sided tape.  Eliminate uneven floor surfaces.  Select a carpet pattern that does not visually hide the edge of steps.  Be aware of all pets. OTHER HOME SAFETY TIPS  Set the water temperature for 120 F (48.8 C).  Keep emergency numbers on or near the telephone.  Keep smoke detectors on every level of the home and near sleeping areas. Document Released: 10/17/2002 Document Revised: 04/27/2012 Document Reviewed: 01/16/2012 [facility] Patient Information 2015 [place]ExitCare, [place]. This information is not intended to replace advice given to you by your health care provider. Make sure you discuss any questions you have with your health care provider.

## 2014-05-10 NOTE — ED Notes (Signed)
Pt also has bruising/swelling noted to R lower leg.

## 2014-05-10 NOTE — ED Notes (Signed)
Ambulated pt to restroom. Pt did well, only stand by assist. Only C/O was rib pain while ambulating.

## 2014-05-10 NOTE — ED Notes (Signed)
Pt states went to go to the restroom this morning when her shoe got caught on the floor wood panel and she fell on R side, states having R rib cage pain, bruising noted to R upper arm, states having R pinkie finger pain and R knee pain also. Denies LOC, denies hitting head.

## 2014-05-10 NOTE — ED Provider Notes (Signed)
CSN: [id number]     Arrival date & time 05/10/14  0602 History   First MD Initiated Contact with Patient 05/10/14 [id number]     Chief Complaint  Patient presents with  . Fall  . rib cage pain     right     (Consider location/radiation/quality/duration/timing/severity/associated sxs/prior Treatment) HPI  Patient to the ER after a fall at home. She has a PMH of hyperlipidemia, hypertension, CAD, CHF, hypothyroidism, GERD, diverticulosis, breast mass, epistaxis, MI, a. Fib, LBBB. She was going to the restroom this am when her shoe got caught on the wooden panel on the floor on the right side. She is complaining of R rib pain, bruising noted to right upper arm and low right side of leg. She did not hit her head, neck and no loc.    Past Medical History  Diagnosis Date  . HYPERLIPIDEMIA 03/07/2008  . HYPERTENSION 08/03/2007  . CORONARY ARTERY DISEASE     a. s/p PCI to RCA '01. b. repeat PCI to RCA '03 2/2 ISR. c. cath 2012: RCA dz, rx medically.  . Acute on chronic systolic heart failure   . HYPOTHYROIDISM   . Esophageal reflux   . DIVERTICULOSIS, COLON   . BREAST MASS, BENIGN   . BARRETT'S ESOPHAGUS, HX OF   . Interstitial cystitis   . Arthritis   . Internal hemorrhoids   . Epistaxis   . Right hand fracture   . Bladder disorder   . Myocardial infarction   . Bradycardia 08/2012    a. Eval for symptomatic bradycardia 08/2012 - beta blocker discontinued.  . Orthostatic hypotension 08/2012    a. 08/2012 w/ syncope - meds adjusted.   . Atrial fibrillation     a. Dx 09/2013.  [name] LBBB (left bundle branch block)    Past Surgical History  Procedure Laterality Date  . Breast surgery      benign mass  . Cholecystectomy    . Cesarean section    . Appendectomy    . Cataract extraction, bilateral    . Tonsillectomy    . Abdominal hysterectomy    . Ankle reconstruction      Left  . Wrist reconstruction      Right  . Cardioversion N/A 10/20/2013    Procedure: CARDIOVERSION;  Surgeon:  Rollene Rotunda, MD;  Location: [facility] ENDOSCOPY;  Service: Cardiovascular;  Laterality: N/A;   Family History  Problem Relation Age of Onset  . Colon cancer Mother   . Coronary artery disease Mother   . Heart disease Mother   . Coronary artery disease Father   . Heart disease Father   . Colon polyps Sister   . Coronary artery disease Sister   . Coronary artery disease Brother   . Diabetes type II Son    History  Substance Use Topics  . Smoking status: Former Smoker -- 1.00 packs/day for 15 years    Types: Cigarettes    Quit date: 11/10/1969  . Smokeless tobacco: Never Used  . Alcohol Use: No   OB History   Grav Para Term Preterm Abortions TAB SAB Ect Mult Living                 Review of Systems   Review of Systems  Gen: no weight loss, fevers, chills, night sweats  Eyes: no discharge or drainage, no occular pain or visual changes  Nose: no epistaxis or rhinorrhea  Mouth: no dental pain, no sore throat  Neck: no neck pain  Lungs:No wheezing,  coughing or hemoptysis CV: no chest pain, palpitations, dependent edema or orthopnea  Abd: no abdominal pain, nausea, vomiting, diarrhea GU: no dysuria or gross hematuria  MSK:  No muscle weakness + pain to arm, ribs and leg Neuro: no headache, no focal neurologic deficits  Skin: no rash or wounds Psyche: no complaints    Allergies  Codeine and Sulfonamide derivatives  Home Medications   Prior to Admission medications   Medication Sig Start Date End Date Taking? Authorizing Provider  acetaminophen (TYLENOL) 500 MG tablet Take 500 mg by mouth every 6 (six) hours as needed for mild pain.   Yes Historical Provider, MD  apixaban (ELIQUIS) 5 MG TABS tablet Take 0.5 tablets (2.5 mg total) by mouth 2 (two) times daily. 10/13/13  Yes Rollene Rotunda, MD  furosemide (LASIX) 40 MG tablet Take 60 mg by mouth daily.   Yes Historical Provider, MD  isosorbide mononitrate (IMDUR) 60 MG 24 hr tablet Take 90 mg by mouth daily.   Yes Historical  Provider, MD  lansoprazole (PREVACID) 30 MG capsule Take 1 capsule (30 mg total) by mouth 2 (two) times daily. 04/21/14 04/21/15 Yes Stacie Glaze, MD  levothyroxine (SYNTHROID, LEVOTHROID) 50 MCG tablet Take 50 mcg by mouth daily before breakfast.   Yes Historical Provider, MD  nitroGLYCERIN (NITROSTAT) 0.4 MG SL tablet Place 1 tablet (0.4 mg total) under the tongue every 5 (five) minutes as needed for chest pain. If pain after 3 pills- call 911 04/21/14  Yes Stacie Glaze, MD  pentosan polysulfate (ELMIRON) 100 MG capsule Take 100 mg by mouth every morning.    Yes Historical Provider, MD  potassium chloride (K-DUR,KLOR-CON) 10 MEQ tablet Take 1 tablet (10 mEq total) by mouth daily. 07/04/13  Yes Stacie Glaze, MD  pravastatin (PRAVACHOL) 40 MG tablet Take 40 mg by mouth daily.   Yes Historical Provider, MD  valsartan (DIOVAN) 320 MG tablet Take 1 tablet (320 mg total) by mouth daily. 10/13/13  Yes Rollene Rotunda, MD  albuterol (PROVENTIL HFA;VENTOLIN HFA) 108 (90 BASE) MCG/ACT inhaler Inhale 2 puffs into the lungs every 6 (six) hours as needed for wheezing or shortness of breath. 04/27/13   Leroy Sea, MD  HYDROcodone-acetaminophen (NORCO/VICODIN) 5-325 MG per tablet Take 1 tablet by mouth every 6 (six) hours as needed. 05/10/14   Elray Dains Irine Seal, PA-C   BP 141/52  Pulse 53  Temp(Src) 98.1 F (36.7 C) (Oral)  Resp 14  Ht 5\' 3"  (1.6 m)  Wt 150 lb (68.04 kg)  BMI 26.58 kg/m2  SpO2 93% Physical Exam  Nursing note and vitals reviewed. Constitutional: She appears well-developed and well-nourished. No distress.  HENT:  Head: Normocephalic and atraumatic.  Eyes: Pupils are equal, round, and reactive to light.  Neck: Normal range of motion. Neck supple. No spinous process tenderness and no muscular tenderness present. Normal range of motion present.  Cardiovascular: Normal rate and regular rhythm.   Pulmonary/Chest: Effort normal. She exhibits tenderness.    Abdominal: Soft.    Musculoskeletal:       Right knee: She exhibits ecchymosis. She exhibits normal range of motion.       Right upper arm: She exhibits tenderness (mild ecchymosis).       Arms:      Legs: Neurological: She is alert.  Skin: Skin is warm and dry.    ED Course  Procedures (including critical care time) Labs Review Labs Reviewed  CBC WITH DIFFERENTIAL - Abnormal; Notable for the following:  Hemoglobin 11.5 (*)    HCT 35.1 (*)    All other components within normal limits  COMPREHENSIVE METABOLIC PANEL - Abnormal; Notable for the following:    Potassium 3.6 (*)    Glucose, Bld 118 (*)    GFR calc non Af Amer 51 (*)    GFR calc Af Amer 59 (*)    All other components within normal limits  URINALYSIS, ROUTINE W REFLEX MICROSCOPIC - Abnormal; Notable for the following:    Hgb urine dipstick TRACE (*)    All other components within normal limits  URINE MICROSCOPIC-ADD ON - Abnormal; Notable for the following:    Casts HYALINE CASTS (*)    All other components within normal limits  PROTIME-INR  APTT    Imaging Review Dg Ribs Unilateral W/chest Right  05/10/2014   CLINICAL DATA:  Fall.  Right lateral rib pain.  EXAM: RIGHT RIBS AND CHEST - 3+ VIEW  COMPARISON:  09/20/2013  FINDINGS: Chronic mild cardiomegaly. There is aortic tortuosity which is stable from prior. Interstitial prominence at the bases is likely related to hypoaeration. No consolidation, edema, effusion, or pneumothorax. No visible rib fracture.  IMPRESSION: 1. No evidence of acute cardiopulmonary disease. 2. Negative for rib fracture.   Electronically Signed   By: Tiburcio PeaJonathan  Watts M.D.   On: 05/10/2014 07:05   Dg Tibia/fibula Right  05/10/2014   CLINICAL DATA:  Fall.  Right lower leg pain.  EXAM: RIGHT TIBIA AND FIBULA - 2 VIEW  COMPARISON:  None.  FINDINGS: No evidence for fracture. There is no worrisome lytic or sclerotic osseous abnormality. Bones are diffusely demineralized. Mild soft tissue swelling is seen distally.   IMPRESSION: No acute bony abnormality.   Electronically Signed   By: Kennith CenterEric  Mansell M.D.   On: 05/10/2014 07:06   Dg Humerus Right  05/10/2014   CLINICAL DATA:  Fall.  Humerus pain.  EXAM: RIGHT HUMERUS - 2+ VIEW  COMPARISON:  None.  FINDINGS: There is no evidence of fracture or other focal bone lesions. No evidence of dislocation.  IMPRESSION: Negative.   Electronically Signed   By: Tiburcio PeaJonathan  Watts M.D.   On: 05/10/2014 07:06     EKG Interpretation None      MDM   Final diagnoses:  Fall against object, initial encounter  Rib contusion, right, initial encounter    Patient seen upon arrival by Dr. Ranae PalmsYelverton. He ordered xray and labs. The patients blood work and xrays have resulted very reassuring. She was brought to the ED by her son who is here with her and will be taking care of her.   Patient ambulated to the bathroom and back without assistance. Has a small amount of pain. Given: Medications  fentaNYL (SUBLIMAZE) injection 50 mcg (50 mcg Intramuscular Given 05/10/14 0736)  ondansetron (ZOFRAN-ODT) disintegrating tablet 4 mg (4 mg Oral Given 05/10/14 0735)    Which has helped her pain. SHe will be given a short course of steroids and a peak flow advised to use 2-3 times per day to keep lungs at good capacity.  80 y.o.Victoria SpittleMary E Gomez's evaluation in the Emergency Department is complete. It has been determined that no acute conditions requiring further emergency intervention are present at this time. The patient/guardian have been advised of the diagnosis and plan. We have discussed signs and symptoms that warrant return to the ED, such as changes or worsening in symptoms.  Vital signs are stable at discharge. Filed Vitals:   05/10/14 0800  BP: 141/52  Pulse:  53  Temp: 98.1 F (36.7 C)  Resp: 14    Patient/guardian has voiced understanding and agreed to follow-up with the PCP or specialist.       Dorthula Matasiffany G Lenay Lovejoy, PA-C 05/10/14 [phone]

## 2014-05-10 NOTE — Progress Notes (Signed)
PT demonstrated verbal and hands on understanding of device. Caregiver also understands importance of encouraging usage.

## 2014-05-13 NOTE — ED Provider Notes (Signed)
Medical screening examination/treatment/procedure(s) were conducted as a shared visit with non-physician practitioner(s) and myself.  I personally evaluated the patient during the encounter.   EKG Interpretation   Date/Time:  Wednesday May 10 2014 06:17:09 EDT Ventricular Rate:  50 PR Interval:  174 QRS Duration: 146 QT Interval:  497 QTC Calculation: 453 R Axis:   -71 Text Interpretation:  Sinus or ectopic atrial rhythm Atrial premature  complexes Nonspecific IVCD with LAD Left ventricular hypertrophy Inferior  infarct, old Anterior infarct, old ED PHYSICIAN INTERPRETATION AVAILABLE  IN [facility] Confirmed by TEST, Record ([id number]) on 05/12/2014 10:10:54  AM      Patient claims she tripped and fell landing on her right side. She denies any head or neck injury. Her vital signs are stable in the emergency department. She has a normal neurologic exam. She has no evidence of head or neck injury. Workup is negative. Discharge home to the care of her son. Return precautions given to  Loren Raceravid Antigone Crowell, MD 05/13/14 [phone]

## 2014-05-24 ENCOUNTER — Encounter: Payer: Self-pay | Admitting: Physician Assistant

## 2014-05-24 ENCOUNTER — Ambulatory Visit (INDEPENDENT_AMBULATORY_CARE_PROVIDER_SITE_OTHER): Payer: Medicare Other | Admitting: Physician Assistant

## 2014-05-24 VITALS — BP 130/80 | HR 66 | Temp 99.2°F | Resp 18 | Wt 158.0 lb

## 2014-05-24 DIAGNOSIS — T148XXA Other injury of unspecified body region, initial encounter: Secondary | ICD-10-CM | POA: Diagnosis not present

## 2014-05-24 DIAGNOSIS — R079 Chest pain, unspecified: Secondary | ICD-10-CM | POA: Diagnosis not present

## 2014-05-24 DIAGNOSIS — R0781 Pleurodynia: Secondary | ICD-10-CM

## 2014-05-24 LAB — CBC WITH DIFFERENTIAL/PLATELET
BASOS PCT: 0.5 % (ref 0.0–3.0)
Basophils Absolute: 0 10*3/uL (ref 0.0–0.1)
EOS ABS: 0.1 10*3/uL (ref 0.0–0.7)
EOS PCT: 1.5 % (ref 0.0–5.0)
HCT: 35 % — ABNORMAL LOW (ref 36.0–46.0)
Hemoglobin: 11.5 g/dL — ABNORMAL LOW (ref 12.0–15.0)
Lymphocytes Relative: 28.4 % (ref 12.0–46.0)
Lymphs Abs: 1.6 10*3/uL (ref 0.7–4.0)
MCHC: 33 g/dL (ref 30.0–36.0)
MCV: 85.2 fl (ref 78.0–100.0)
MONOS PCT: 7.2 % (ref 3.0–12.0)
Monocytes Absolute: 0.4 10*3/uL (ref 0.1–1.0)
NEUTROS PCT: 62.4 % (ref 43.0–77.0)
Neutro Abs: 3.6 10*3/uL (ref 1.4–7.7)
Platelets: 210 10*3/uL (ref 150.0–400.0)
RBC: 4.11 Mil/uL (ref 3.87–5.11)
RDW: 14.1 % (ref 11.5–15.5)
WBC: 5.8 10*3/uL (ref 4.0–10.5)

## 2014-05-24 MED ORDER — TRAMADOL HCL 50 MG PO TABS: 50.0000 mg | ORAL_TABLET | Freq: Three times a day (TID) | ORAL | Status: DC | PRN

## 2014-05-24 NOTE — Progress Notes (Signed)
Subjective:    Patient ID: Victoria Gomez, female    DOB: 05-29-1925, 80 y.o.   MRN: [id number]  HPI Patient is a 80 y.o. female presenting for ED follow up.  ED course: HPI  Patient to the ER after a fall at home. She has a PMH of hyperlipidemia, hypertension, CAD, CHF, hypothyroidism, GERD, diverticulosis, breast mass, epistaxis, MI, a. Fib, LBBB. She was going to the restroom this am when her shoe got caught on the wooden panel on the floor on the right side. She is complaining of R rib pain, bruising noted to right upper arm and low right side of leg. She did not hit her head, neck and no loc.     MDM Final diagnoses:   Fall against object, initial encounter   Rib contusion, right, initial encounter   Patient seen upon arrival by Dr. Ranae Palms. He ordered xray and labs.  The patients blood work and xrays have resulted very reassuring. She was brought to the ED by her son who is here with her and will be taking care of her.  Patient ambulated to the bathroom and back without assistance. Has a small amount of pain. Given:  Medications   fentaNYL (SUBLIMAZE) injection 50 mcg (50 mcg Intramuscular Given 05/10/14 0736)   ondansetron (ZOFRAN-ODT) disintegrating tablet 4 mg (4 mg Oral Given 05/10/14 0735)   Which has helped her pain. SHe will be given a short course of steroids and a peak flow advised to use 2-3 times per day to keep lungs at good capacity.  80 y.o.Victoria Gomez's evaluation in the Emergency Department is complete. It has been determined that no acute conditions requiring further emergency intervention are present at this time. The patient/guardian have been advised of the diagnosis and plan. We have discussed signs and symptoms that warrant return to the ED, such as changes or worsening in symptoms.  Vital signs are stable at discharge.  Filed Vitals:    05/10/14 0800   BP:  141/52   Pulse:  53   Temp:  98.1 F (36.7 C)   Resp:  14   Patient/guardian has voiced  understanding and agreed to follow-up with the PCP or specialist.  Pt presents today, 2 weeks following her ED presentation stating that she is still in pain. Per pt, she had fallen 4AM while rushing to the bathroom due to tripping over the tile juncture in the bathroom doorway. She fell hitting her right arm, right side, and right leg. She states that her bruises are slowly improving, and the Xrays done at the ED of her arm, leg, and ribs were all negative for fracture. She states that she is still experiencing some pain from her right side, arm, and leg, worse at night, and that she still has left over pain pills from the ED and has not had to take it as much. She has been applying ice to her ribs for pain relief. Pt denies hitting her head during the fall. Pt sees cardiology for Afib and takes Eliquis. She denies fevers, chills, nausea, vomiting, diarrhea, SOB, chest pain, headache, syncope, or any other falls.   Review of Systems As per HPI and are otherwise negative.    Past Medical History  Diagnosis Date  . HYPERLIPIDEMIA 03/07/2008  . HYPERTENSION 08/03/2007  . CORONARY ARTERY DISEASE     a. s/p PCI to RCA '01. b. repeat PCI to RCA '03 2/2 ISR. c. cath 2012: RCA dz, rx medically.  . Acute  on chronic systolic heart failure   . HYPOTHYROIDISM   . Esophageal reflux   . DIVERTICULOSIS, COLON   . BREAST MASS, BENIGN   . BARRETT'S ESOPHAGUS, HX OF   . Interstitial cystitis   . Arthritis   . Internal hemorrhoids   . Epistaxis   . Right hand fracture   . Bladder disorder   . Myocardial infarction   . Bradycardia 08/2012    a. Eval for symptomatic bradycardia 08/2012 - beta blocker discontinued.  . Orthostatic hypotension 08/2012    a. 08/2012 w/ syncope - meds adjusted.   . Atrial fibrillation     a. Dx 09/2013.  [name] LBBB (left bundle branch block)     History   Social History  . Marital Status: Widowed    Spouse Name: N/A    Number of Children: N/A  . Years of Education: N/A    Occupational History  . retired    Social History Main Topics  . Smoking status: Former Smoker -- 1.00 packs/day for 15 years    Types: Cigarettes    Quit date: 11/10/1969  . Smokeless tobacco: Never Used  . Alcohol Use: No  . Drug Use: No  . Sexual Activity: No   Other Topics Concern  . Not on file   Social History Narrative  . No narrative on file    Past Surgical History  Procedure Laterality Date  . Breast surgery      benign mass  . Cholecystectomy    . Cesarean section    . Appendectomy    . Cataract extraction, bilateral    . Tonsillectomy    . Abdominal hysterectomy    . Ankle reconstruction      Left  . Wrist reconstruction      Right  . Cardioversion N/A 10/20/2013    Procedure: CARDIOVERSION;  Surgeon: Rollene Rotunda, MD;  Location: [facility] ENDOSCOPY;  Service: Cardiovascular;  Laterality: N/A;    Family History  Problem Relation Age of Onset  . Colon cancer Mother   . Coronary artery disease Mother   . Heart disease Mother   . Coronary artery disease Father   . Heart disease Father   . Colon polyps Sister   . Coronary artery disease Sister   . Coronary artery disease Brother   . Diabetes type II Son     Allergies  Allergen Reactions  . Codeine Hives and Nausea And Vomiting  . Sulfonamide Derivatives Swelling    Turns red    Current Outpatient Prescriptions on File Prior to Visit  Medication Sig Dispense Refill  . acetaminophen (TYLENOL) 500 MG tablet Take 500 mg by mouth every 6 (six) hours as needed for mild pain.      [name] albuterol (PROVENTIL HFA;VENTOLIN HFA) 108 (90 BASE) MCG/ACT inhaler Inhale 2 puffs into the lungs every 6 (six) hours as needed for wheezing or shortness of breath.  1 Inhaler  2  . apixaban (ELIQUIS) 5 MG TABS tablet Take 0.5 tablets (2.5 mg total) by mouth 2 (two) times daily.  180 tablet  3  . furosemide (LASIX) 40 MG tablet Take 60 mg by mouth daily.      [name] HYDROcodone-acetaminophen (NORCO/VICODIN) 5-325 MG per tablet  Take 1 tablet by mouth every 6 (six) hours as needed.  15 tablet  0  . isosorbide mononitrate (IMDUR) 60 MG 24 hr tablet Take 90 mg by mouth daily.      . lansoprazole (PREVACID) 30 MG capsule Take 1 capsule (30 mg  total) by mouth 2 (two) times daily.  180 capsule  3  . levothyroxine (SYNTHROID, LEVOTHROID) 50 MCG tablet Take 50 mcg by mouth daily before breakfast.      . nitroGLYCERIN (NITROSTAT) 0.4 MG SL tablet Place 1 tablet (0.4 mg total) under the tongue every 5 (five) minutes as needed for chest pain. If pain after 3 pills- call 911  20 tablet  2  . pentosan polysulfate (ELMIRON) 100 MG capsule Take 100 mg by mouth every morning.       . potassium chloride (K-DUR,KLOR-CON) 10 MEQ tablet Take 1 tablet (10 mEq total) by mouth daily.  90 tablet  3  . pravastatin (PRAVACHOL) 40 MG tablet Take 40 mg by mouth daily.      . valsartan (DIOVAN) 320 MG tablet Take 1 tablet (320 mg total) by mouth daily.  90 tablet  3   No current facility-administered medications on file prior to visit.    EXAM: BP 130/80  Pulse 66  Temp(Src) 99.2 F (37.3 C) (Oral)  Resp 18  Wt 158 lb (71.668 kg)     Objective:   Physical Exam  Nursing note and vitals reviewed. Constitutional: She is oriented to person, place, and time. She appears well-developed and well-nourished. No distress.  HENT:  Head: Normocephalic and atraumatic.  Eyes: Conjunctivae and EOM are normal. Pupils are equal, round, and reactive to light.  Neck: Normal range of motion.  Cardiovascular: Normal rate, regular rhythm and intact distal pulses.   Pulmonary/Chest: Effort normal and breath sounds normal. No respiratory distress. She exhibits no tenderness.  Musculoskeletal: Normal range of motion. She exhibits no edema.  Neurological: She is alert and oriented to person, place, and time.  Skin: Skin is warm and dry. No rash noted. She is not diaphoretic. No erythema. No pallor.  Healing contusions to right upper arm and right anterior  lower leg. Both of these have small underlying hematomas. Mild TTP. No erythema, swelling, warmth, fluctuance. No visible contusion to right ribs.   Psychiatric: She has a normal mood and affect. Her behavior is normal. Judgment and thought content normal.    Lab Results  Component Value Date   WBC 6.1 05/10/2014   HGB 11.5* 05/10/2014   HCT 35.1* 05/10/2014   PLT 185 05/10/2014   GLUCOSE 118* 05/10/2014   CHOL 153 02/14/2013   TRIG 130.0 02/14/2013   HDL 47.20 02/14/2013   LDLDIRECT 79.7 12/11/2010   LDLCALC 80 02/14/2013   ALT 10 05/10/2014   AST 18 05/10/2014   NA 141 05/10/2014   K 3.6* 05/10/2014   CL 103 05/10/2014   CREATININE 0.97 05/10/2014   BUN 20 05/10/2014   CO2 25 05/10/2014   TSH 0.31* 04/21/2014   INR 1.16 05/10/2014   HGBA1C 6.5 11/18/2013         Assessment & Plan:  Victoria Gomez was seen today for hospitalization follow-up.  Diagnoses and associated orders for this visit:  Hematoma Comments: Right arm and leg, resolving. will apply heat and elevate. Offer Tramadol for pain instead of hydrocodone.   - traMADol (ULTRAM) 50 MG tablet; Take 1 tablet (50 mg total) by mouth every 8 (eight) hours as needed. - CBC with Differential  Rib pain on right side Comments: Resolving, continue to ice the area to decrease inflammation. - traMADol (ULTRAM) 50 MG tablet; Take 1 tablet (50 mg total) by mouth every 8 (eight) hours as needed.    Will have pt switch from hydrocodone to tramadol for pain as  needed.  Apply heat to the hematomas and elevate to help them resolve more quickly. Ice to the ribs for to help pain and inflammation.  Return precautions provided, and patient handout on hematomas.  Will recheck CBC due to slightly low hemoglobin at the ED.  Plan to follow up as needed, or for worsening or persistent symptoms despite treatment.  Patient Instructions  Tramadol as directed for pain instead of the hydrocodone.  Apply heat to the hematomas on your arm and leg, and keep these areas elevated to  reduce inflammation and swelling.  Continue to apply ice to your ribs to help decrease the inflammation there.  We will call with your lab results when available.  If emergency symptoms discussed during visit developed, seek medical attention immediately.  Followup as needed, or for worsening or persistent symptoms despite treatment.

## 2014-05-24 NOTE — Progress Notes (Signed)
Pre visit review using our clinic review tool, if applicable. No additional management support is needed unless otherwise documented below in the visit note. 

## 2014-05-24 NOTE — Patient Instructions (Signed)
Tramadol as directed for pain instead of the hydrocodone.  Apply heat to the hematomas on your arm and leg, and keep these areas elevated to reduce inflammation and swelling.  Continue to apply ice to your ribs to help decrease the inflammation there.  We will call with your lab results when available.  If emergency symptoms discussed during visit developed, seek medical attention immediately.  Followup as needed, or for worsening or persistent symptoms despite treatment.   Hematoma A hematoma is a collection of blood. The collection of blood can turn into a hard, painful lump under the skin. Your skin may turn blue or yellow if the hematoma is close to the surface of the skin. Most hematomas get better in a few days to weeks. Some hematomas are serious and need medical care. Hematomas can be very small or very big. HOME CARE  Apply ice to the injured area:  Put ice in a plastic bag.  Place a towel between your skin and the bag.  Leave the ice on for 20 minutes, 2-3 times a day for the first 1 to 2 days.  After the first 2 days, switch to using warm packs on the injured area.  Raise (elevate) the injured area to lessen pain and puffiness (swelling). You may also wrap the area with an elastic bandage. Make sure the bandage is not wrapped too tight.  If you have a painful hematoma on your leg or foot, you may use crutches for a couple days.  Only take medicines as told by your doctor. GET HELP RIGHT AWAY IF:   Your pain gets worse.  Your pain is not controlled with medicine.  You have a fever.  Your puffiness gets worse.  Your skin turns more blue or yellow.  Your skin over the hematoma breaks or starts bleeding.  Your hematoma is in your chest or belly (abdomen) and you are short of breath, feel weak, or have a change in consciousness.  Your hematoma is on your scalp and you have a headache that gets worse or a change in alertness or consciousness. MAKE SURE YOU:    Understand these instructions.  Will watch your condition.  Will get help right away if you are not doing well or get worse. Document Released: 12/04/2004 Document Revised: 06/29/2013 Document Reviewed: 04/06/2013 [facility] Patient Information 2015 [employer], [place]. This information is not intended to replace advice given to you by your health care provider. Make sure you discuss any questions you have with your health care provider.

## 2014-05-25 ENCOUNTER — Other Ambulatory Visit: Payer: Self-pay | Admitting: Physician Assistant

## 2014-05-25 DIAGNOSIS — D539 Nutritional anemia, unspecified: Secondary | ICD-10-CM

## 2014-05-25 DIAGNOSIS — D649 Anemia, unspecified: Secondary | ICD-10-CM

## 2014-05-25 DIAGNOSIS — Z09 Encounter for follow-up examination after completed treatment for conditions other than malignant neoplasm: Secondary | ICD-10-CM

## 2014-05-25 NOTE — Progress Notes (Signed)
Called and spoke with pt and pt is aware of lab appt on 05/26/2014.  Pt verbalized understanding.

## 2014-05-26 ENCOUNTER — Other Ambulatory Visit (INDEPENDENT_AMBULATORY_CARE_PROVIDER_SITE_OTHER): Payer: Medicare Other

## 2014-05-26 DIAGNOSIS — D539 Nutritional anemia, unspecified: Secondary | ICD-10-CM | POA: Diagnosis not present

## 2014-05-26 DIAGNOSIS — Z09 Encounter for follow-up examination after completed treatment for conditions other than malignant neoplasm: Secondary | ICD-10-CM

## 2014-05-26 DIAGNOSIS — D649 Anemia, unspecified: Secondary | ICD-10-CM | POA: Diagnosis not present

## 2014-05-26 LAB — IBC PANEL
Iron: 54 ug/dL (ref 42–145)
SATURATION RATIOS: 17.8 % — AB (ref 20.0–50.0)
Transferrin: 216.8 mg/dL (ref 212.0–360.0)

## 2014-05-26 LAB — VITAMIN B12: Vitamin B-12: 205 pg/mL — ABNORMAL LOW (ref 211–911)

## 2014-06-07 ENCOUNTER — Other Ambulatory Visit: Payer: Medicare Other

## 2014-06-07 ENCOUNTER — Telehealth: Payer: Self-pay

## 2014-06-07 DIAGNOSIS — K5731 Diverticulosis of large intestine without perforation or abscess with bleeding: Secondary | ICD-10-CM

## 2014-06-07 LAB — POC HEMOCCULT BLD/STL (HOME/3-CARD/SCREEN)
Card #2 Date: NEGATIVE
Card #3 Date: NEGATIVE
OCCULT BLOOD DATE: NEGATIVE

## 2014-06-07 NOTE — Telephone Encounter (Signed)
Pt would like to know if her blood thinner will remain the same or if it needs to change.  Pls advise.

## 2014-06-08 NOTE — Telephone Encounter (Signed)
No indication for it to change.

## 2014-06-09 ENCOUNTER — Ambulatory Visit: Payer: Medicare Other

## 2014-06-09 NOTE — Telephone Encounter (Signed)
Pt came into office and was advised of recommendations.

## 2014-06-13 ENCOUNTER — Other Ambulatory Visit: Payer: Self-pay

## 2014-06-13 ENCOUNTER — Telehealth: Payer: Self-pay | Admitting: Cardiology

## 2014-06-13 DIAGNOSIS — Z1231 Encounter for screening mammogram for malignant neoplasm of breast: Secondary | ICD-10-CM

## 2014-06-13 NOTE — Telephone Encounter (Signed)
Note forwarded to Dr. Antoine PocheHochrein for review

## 2014-06-13 NOTE — Telephone Encounter (Signed)
Victoria Gomez stat that she fell and had to go to the E/R. And was todl  that her heart rate was running in the 40"s and she should let Dr.Hocj=hrein know about this .[name]. Please call  Thanks

## 2014-06-14 NOTE — Telephone Encounter (Signed)
Per husband Victoria Gomez she tripped and fell during the night while going to the bathroom.  No symptoms of syncope or presyncope.  Will continue same medication.  PCP has just started B12 Shots.

## 2014-06-14 NOTE — Telephone Encounter (Signed)
She is not on any rate lower drugs that I can see.  Given this, if she has no presyncope or syncope, no further work up is indicated.  If there is any suggestion that she is having these symptoms she would need a 48 hour Holter.

## 2014-06-23 ENCOUNTER — Ambulatory Visit: Payer: Medicare Other

## 2014-06-29 ENCOUNTER — Other Ambulatory Visit: Payer: Self-pay | Admitting: Physician Assistant

## 2014-06-29 ENCOUNTER — Ambulatory Visit
Admission: RE | Admit: 2014-06-29 | Discharge: 2014-06-29 | Disposition: A | Discharge status: Home / Self Care | Payer: Medicare Other | Source: Ambulatory Visit

## 2014-06-29 DIAGNOSIS — N63 Unspecified lump in unspecified breast: Secondary | ICD-10-CM

## 2014-06-29 DIAGNOSIS — Z1231 Encounter for screening mammogram for malignant neoplasm of breast: Secondary | ICD-10-CM

## 2014-06-29 DIAGNOSIS — N644 Mastodynia: Secondary | ICD-10-CM

## 2014-06-30 ENCOUNTER — Ambulatory Visit (INDEPENDENT_AMBULATORY_CARE_PROVIDER_SITE_OTHER): Payer: Medicare Other

## 2014-06-30 ENCOUNTER — Other Ambulatory Visit: Payer: Self-pay | Admitting: Physician Assistant

## 2014-06-30 DIAGNOSIS — E538 Deficiency of other specified B group vitamins: Secondary | ICD-10-CM | POA: Diagnosis not present

## 2014-06-30 DIAGNOSIS — N644 Mastodynia: Secondary | ICD-10-CM

## 2014-06-30 DIAGNOSIS — N63 Unspecified lump in unspecified breast: Secondary | ICD-10-CM

## 2014-06-30 MED ORDER — CYANOCOBALAMIN 1000 MCG/ML IJ SOLN
1000.0000 ug | Freq: Once | INTRAMUSCULAR | Status: AC
  Administered 2014-06-30: 1000 ug via INTRAMUSCULAR

## 2014-07-14 ENCOUNTER — Ambulatory Visit (INDEPENDENT_AMBULATORY_CARE_PROVIDER_SITE_OTHER): Payer: Medicare Other

## 2014-07-14 DIAGNOSIS — E538 Deficiency of other specified B group vitamins: Secondary | ICD-10-CM

## 2014-07-14 MED ORDER — CYANOCOBALAMIN 1000 MCG/ML IJ SOLN
1000.0000 ug | Freq: Once | INTRAMUSCULAR | Status: AC
  Administered 2014-07-14: 1000 ug via INTRAMUSCULAR

## 2014-07-19 ENCOUNTER — Other Ambulatory Visit: Payer: Self-pay | Admitting: Internal Medicine

## 2014-07-21 ENCOUNTER — Ambulatory Visit: Payer: Medicare Other | Admitting: Physician Assistant

## 2014-07-21 ENCOUNTER — Ambulatory Visit
Admission: RE | Admit: 2014-07-21 | Discharge: 2014-07-21 | Disposition: A | Discharge status: Home / Self Care | Payer: Medicare Other | Source: Ambulatory Visit | Attending: Physician Assistant | Admitting: Physician Assistant

## 2014-07-21 DIAGNOSIS — N6489 Other specified disorders of breast: Secondary | ICD-10-CM | POA: Diagnosis not present

## 2014-07-21 DIAGNOSIS — N644 Mastodynia: Secondary | ICD-10-CM

## 2014-07-21 DIAGNOSIS — Z85820 Personal history of malignant melanoma of skin: Secondary | ICD-10-CM | POA: Diagnosis not present

## 2014-07-21 DIAGNOSIS — N63 Unspecified lump in unspecified breast: Secondary | ICD-10-CM

## 2014-07-26 ENCOUNTER — Ambulatory Visit: Payer: Medicare Other | Admitting: Physician Assistant

## 2014-07-28 ENCOUNTER — Ambulatory Visit: Payer: Medicare Other | Admitting: Family Medicine

## 2014-07-28 ENCOUNTER — Encounter: Payer: Self-pay | Admitting: Family Medicine

## 2014-07-28 ENCOUNTER — Ambulatory Visit (INDEPENDENT_AMBULATORY_CARE_PROVIDER_SITE_OTHER): Payer: Medicare Other | Admitting: Family Medicine

## 2014-07-28 VITALS — BP 122/70 | HR 60 | Temp 98.7°F | Wt 157.0 lb

## 2014-07-28 DIAGNOSIS — E039 Hypothyroidism, unspecified: Secondary | ICD-10-CM

## 2014-07-28 DIAGNOSIS — I2510 Atherosclerotic heart disease of native coronary artery without angina pectoris: Secondary | ICD-10-CM | POA: Diagnosis not present

## 2014-07-28 DIAGNOSIS — Z23 Encounter for immunization: Secondary | ICD-10-CM | POA: Diagnosis not present

## 2014-07-28 DIAGNOSIS — I4891 Unspecified atrial fibrillation: Secondary | ICD-10-CM

## 2014-07-28 DIAGNOSIS — I10 Essential (primary) hypertension: Secondary | ICD-10-CM | POA: Diagnosis not present

## 2014-07-28 DIAGNOSIS — R7309 Other abnormal glucose: Secondary | ICD-10-CM | POA: Diagnosis not present

## 2014-07-28 DIAGNOSIS — E538 Deficiency of other specified B group vitamins: Secondary | ICD-10-CM | POA: Insufficient documentation

## 2014-07-28 DIAGNOSIS — R739 Hyperglycemia, unspecified: Secondary | ICD-10-CM

## 2014-07-28 LAB — CBC
HEMATOCRIT: 37.1 % (ref 36.0–46.0)
HEMOGLOBIN: 12.5 g/dL (ref 12.0–15.0)
MCHC: 33.7 g/dL (ref 30.0–36.0)
MCV: 84.8 fl (ref 78.0–100.0)
PLATELETS: 195 10*3/uL (ref 150.0–400.0)
RBC: 4.38 Mil/uL (ref 3.87–5.11)
RDW: 14 % (ref 11.5–15.5)
WBC: 6.8 10*3/uL (ref 4.0–10.5)

## 2014-07-28 LAB — HEMOGLOBIN A1C: Hgb A1c MFr Bld: 6.7 % — ABNORMAL HIGH (ref 4.6–6.5)

## 2014-07-28 LAB — TSH: TSH: 0.35 u[IU]/mL (ref 0.35–4.50)

## 2014-07-28 MED ORDER — CYANOCOBALAMIN 1000 MCG/ML IJ SOLN
1000.0000 ug | Freq: Once | INTRAMUSCULAR | Status: AC
  Administered 2014-07-28: 1000 ug via INTRAMUSCULAR

## 2014-07-28 NOTE — Patient Instructions (Signed)
Check some labs today including thyroid.   See me back in 3 months. We will check your b12 at that time. Continue twice a week b12 injections for now.   I want you to look over your living will and think more about what you want in end of life care.

## 2014-07-28 NOTE — Progress Notes (Signed)
Victoria Conch, MD Phone: [phone]  Subjective:  Patient presents today to establish care with me as their new primary care provider. Patient was formerly a patient of Dr. Lovell Sheehan. Chief complaint-noted.   CAD-stable occasional chest pain and takes nitroglycerin once a month with no recent increase. Has follow up with cardiology late next month. ROS-no shortness of breath, does have mild fatigue question if related to B12 deficiency  Atrial fibrillation-stable Appears to remain in normal sinus rhythm since her cardioversion in November of last year. She remains anticoagulated on eliquis ROS-no palpitations  Hypothyroidism-stable On thyroid medication-levothyroxine 50 mcg. Last TSH was mildly decreased. We'll recheck today ROS-No hair or nail changes. No heat/cold intolerance. No constipation or diarrhea. Denies shakiness or anxiety. Does have some fatigue  The following were reviewed and entered/updated in epic: Past Medical History  Diagnosis Date  . HYPERLIPIDEMIA 03/07/2008  . HYPERTENSION 08/03/2007  . CORONARY ARTERY DISEASE     a. s/p PCI to RCA '01. b. repeat PCI to RCA '03 2/2 ISR. c. cath 2012: RCA dz, rx medically.  . Acute on chronic systolic heart failure   . HYPOTHYROIDISM   . Esophageal reflux   . DIVERTICULOSIS, COLON   . BREAST MASS, BENIGN   . BARRETT'S ESOPHAGUS, HX OF   . Interstitial cystitis   . Arthritis   . Internal hemorrhoids   . Epistaxis   . Right hand fracture   . Bladder disorder   . Myocardial infarction   . Bradycardia 08/2012    a. Eval for symptomatic bradycardia 08/2012 - beta blocker discontinued.  . Orthostatic hypotension 08/2012    a. 08/2012 w/ syncope - meds adjusted.   . Atrial fibrillation     a. Dx 09/2013.  [name] LBBB (left bundle branch block)   . History of breast lump/mass excision 05/23/2009    Qualifier: History of  By: Lovell Sheehan MD, Balinda Quails    Patient Active Problem List   Diagnosis Date Noted  . Atrial fibrillation  09/21/2013    Priority: High  . Chronic diastolic CHF (congestive heart failure) 04/26/2013    Priority: High  . H/O sinus bradycardia 08/10/2012    Priority: High  . Diastolic HF (heart failure) 11/17/2011    Priority: High  . CORONARY ARTERY DISEASE 08/03/2007    Priority: High  . Vitamin B12 deficiency 07/28/2014    Priority: Medium  . CKD (chronic kidney disease), stage III 09/22/2013    Priority: Medium  . LBBB (left bundle branch block) 09/22/2013    Priority: Medium  . Hyperglycemia 09/22/2013    Priority: Medium  . Interstitial cystitis     Priority: Medium  . HYPERLIPIDEMIA 03/07/2008    Priority: Medium  . HYPOTHYROIDISM 08/03/2007    Priority: Medium  . HYPERTENSION 08/03/2007    Priority: Medium  . Internal hemorrhoids     Priority: Low  . Orthostatic hypotension 08/10/2012    Priority: Low  . Fatigue 04/20/2012    Priority: Low  . ARTHRITIS 12/30/2010    Priority: Low  . Left lumbar radiculopathy 12/11/2010    Priority: Low  . Esophageal reflux 10/18/2007    Priority: Low  . BARRETT'S ESOPHAGUS, HX OF 08/03/2007    Priority: Low   Past Surgical History  Procedure Laterality Date  . Breast surgery      benign mass  . Cholecystectomy    . Cesarean section    . Appendectomy    . Cataract extraction, bilateral    . Tonsillectomy    .  Abdominal hysterectomy    . Ankle reconstruction      Left  . Wrist reconstruction      Right  . Cardioversion N/A 10/20/2013    j    Family History  Problem Relation Age of Onset  . Colon cancer Mother   . Coronary artery disease Mother   . Heart disease Mother   . Coronary artery disease Father   . Heart disease Father   . Colon polyps Sister   . Coronary artery disease Sister   . Coronary artery disease Brother   . Diabetes type II Son     Medications- reviewed and updated Current Outpatient Prescriptions  Medication Sig Dispense Refill  . acetaminophen (TYLENOL) 500 MG tablet Take 500 mg by mouth  every 6 (six) hours as needed for mild pain.      [name] albuterol (PROVENTIL HFA;VENTOLIN HFA) 108 (90 BASE) MCG/ACT inhaler Inhale 2 puffs into the lungs every 6 (six) hours as needed for wheezing or shortness of breath.  1 Inhaler  2  . apixaban (ELIQUIS) 5 MG TABS tablet Take 0.5 tablets (2.5 mg total) by mouth 2 (two) times daily.  180 tablet  3  . furosemide (LASIX) 40 MG tablet Take 60 mg by mouth daily.      . isosorbide mononitrate (IMDUR) 60 MG 24 hr tablet Take 90 mg by mouth daily.      [name] KLOR-CON 10 10 MEQ tablet TAKE 1 TABLET DAILY  90 tablet  1  . lansoprazole (PREVACID) 30 MG capsule Take 1 capsule (30 mg total) by mouth 2 (two) times daily.  180 capsule  3  . levothyroxine (SYNTHROID, LEVOTHROID) 50 MCG tablet Take 50 mcg by mouth daily before breakfast.      . pentosan polysulfate (ELMIRON) 100 MG capsule Take 100 mg by mouth every morning.       . pravastatin (PRAVACHOL) 40 MG tablet Take 40 mg by mouth daily.      . valsartan (DIOVAN) 320 MG tablet Take 1 tablet (320 mg total) by mouth daily.  90 tablet  3  . nitroGLYCERIN (NITROSTAT) 0.4 MG SL tablet Place 1 tablet (0.4 mg total) under the tongue every 5 (five) minutes as needed for chest pain. If pain after 3 pills- call 911  20 tablet  2  . [DISCONTINUED] potassium chloride (K-DUR,KLOR-CON) 10 MEQ tablet Take 1 tablet (10 mEq total) by mouth daily.  90 tablet  3   No current facility-administered medications for this visit.    Allergies-reviewed and updated Allergies  Allergen Reactions  . Codeine Hives and Nausea And Vomiting  . Sulfonamide Derivatives Swelling    Turns red    History   Social History  . Marital Status: Widowed    Spouse Name: N/A    Number of Children: N/A  . Years of Education: N/A   Occupational History  . retired    Social History Main Topics  . Smoking status: Former Smoker -- 1.00 packs/day for 15 years    Types: Cigarettes    Quit date: 11/10/1969  . Smokeless tobacco: Never Used  .  Alcohol Use: No  . Drug Use: No  . Sexual Activity: No   Other Topics Concern  . None   Social History Narrative   Widowed 05/2013. 2nd marriage. First husband died of cancer. 3 sons-[name]- 2 sons, 2 twin girls, [name]-1 son, no grandsons. [name]-1 son, 1 daughter, no grandkids.       LIves with youngest son (has  cancer). Son drives her but has license. Independent IADLs.       Retired from [employer] after 33 years.       Hobbies: crocheting, used to bowl with husband, reading      Living will: currently wants resuscitation.     ROS--See HPI   Objective: BP 122/70  Pulse 60  Temp(Src) 98.7 F (37.1 C)  Wt 157 lb (71.215 kg) Gen: NAD, resting comfortably, appears comfortable.  HEENT: Mucous membranes are moist. Oropharynx normal. Dentures on top only.  Neck: no thyromegaly CV: RRR no murmurs rubs or gallops (not in a fib) Lungs: CTAB no crackles, wheeze, rhonchi Abdomen: soft/nontender/nondistended/normal bowel sounds.  Ext: trace edema Skin: warm, dry Neuro: grossly normal, moves all extremities, PERRLA   Assessment/Plan:  CORONARY ARTERY DISEASE Stable. Continue medical management withImdur 90 mg daily, pravastatin, BP control, as needed nitroglycerin. No recent increasing nitroglycerin usage  Atrial fibrillation In sinus rhythm today. Continue eliquis. Well-controlled  HYPOTHYROIDISM Last TSH mildly decreased. Check TSH today. Symptomatically well control except for mild fatigue   mild fatigue could have multiple potential etiologies including thyroid issues, result of known CHF, vitamin B12 deficiency. We'll recheck B12 in 3 months.  We also discussed end-of-life care. Patient currently full code. Has living will at home which asked her bring to next visit. We made a joint decision to no longer pursue colonoscopy. She also has a history of Barrett's esophagus noted in 2006 but we discussed that other comorbidities likely would be cause of death before esophageal  cancer. It is not clear to me why she did not have followup if not for end-of-life planning essentially. We made a decision not to pursue GI referral for repeat endoscopy despite potential risks of a malignancy.  CBC (mild anemia last visit), a1c (elevated a1c) -recheck. Nonfasting.  Orders Placed This Encounter  Procedures  . CBC    [place]  . TSH    [place]  . Hemoglobin A1c    [place]   Meds ordered this encounter  Medications  . cyanocobalamin ((VITAMIN B-12)) injection 1,000 mcg    Sig:    Health Maintenance Due  Topic Date Due  . Influenza Vaccine  06/10/2014

## 2014-07-28 NOTE — Assessment & Plan Note (Signed)
Last TSH mildly decreased. Check TSH today. Symptomatically well control except for mild fatigue

## 2014-07-28 NOTE — Assessment & Plan Note (Addendum)
Stable. Continue medical management withImdur 90 mg daily, pravastatin, BP control, as needed nitroglycerin. No recent increasing nitroglycerin usage

## 2014-07-28 NOTE — Assessment & Plan Note (Signed)
In sinus rhythm today. Continue eliquis. Well-controlled

## 2014-07-31 ENCOUNTER — Encounter: Payer: Self-pay | Admitting: Family Medicine

## 2014-08-18 ENCOUNTER — Ambulatory Visit (INDEPENDENT_AMBULATORY_CARE_PROVIDER_SITE_OTHER): Payer: Medicare Other | Admitting: Family Medicine

## 2014-08-18 DIAGNOSIS — E538 Deficiency of other specified B group vitamins: Secondary | ICD-10-CM | POA: Diagnosis not present

## 2014-08-18 MED ORDER — CYANOCOBALAMIN 1000 MCG/ML IJ SOLN
1000.0000 ug | Freq: Once | INTRAMUSCULAR | Status: AC
  Administered 2014-08-18: 1000 ug via INTRAMUSCULAR

## 2014-08-31 ENCOUNTER — Telehealth: Payer: Self-pay | Admitting: Family Medicine

## 2014-08-31 NOTE — Telephone Encounter (Signed)
Pt states you advised her to get a protein check, is that urine protein?  If so, ok to schedule? Pt coming in the am for her b12 and we can do then. Pls advise

## 2014-09-01 ENCOUNTER — Ambulatory Visit (INDEPENDENT_AMBULATORY_CARE_PROVIDER_SITE_OTHER): Payer: Medicare Other | Admitting: *Deleted

## 2014-09-01 DIAGNOSIS — E538 Deficiency of other specified B group vitamins: Secondary | ICD-10-CM

## 2014-09-01 MED ORDER — CYANOCOBALAMIN 1000 MCG/ML IJ SOLN
1000.0000 ug | Freq: Once | INTRAMUSCULAR | Status: AC
  Administered 2014-09-01: 1000 ug via INTRAMUSCULAR

## 2014-09-04 NOTE — Telephone Encounter (Signed)
I was out of the office sorry. She does not need this as on valsartan-urine micro.

## 2014-09-08 ENCOUNTER — Ambulatory Visit (INDEPENDENT_AMBULATORY_CARE_PROVIDER_SITE_OTHER): Payer: Medicare Other | Admitting: Cardiology

## 2014-09-08 ENCOUNTER — Other Ambulatory Visit: Payer: Self-pay | Admitting: *Deleted

## 2014-09-08 ENCOUNTER — Encounter: Payer: Self-pay | Admitting: Cardiology

## 2014-09-08 VITALS — BP 132/89 | HR 71 | Ht 63.0 in | Wt 157.0 lb

## 2014-09-08 DIAGNOSIS — R079 Chest pain, unspecified: Secondary | ICD-10-CM

## 2014-09-08 DIAGNOSIS — I2510 Atherosclerotic heart disease of native coronary artery without angina pectoris: Secondary | ICD-10-CM

## 2014-09-08 DIAGNOSIS — E785 Hyperlipidemia, unspecified: Secondary | ICD-10-CM | POA: Diagnosis not present

## 2014-09-08 MED ORDER — APIXABAN 2.5 MG PO TABS: 2.5000 mg | ORAL_TABLET | Freq: Two times a day (BID) | ORAL | Status: DC

## 2014-09-08 MED ORDER — VALSARTAN 320 MG PO TABS: 320.0000 mg | ORAL_TABLET | Freq: Every day | ORAL | Status: DC

## 2014-09-08 NOTE — Patient Instructions (Signed)
Your physician recommends that you schedule a follow-up appointment in: 6 Months with Dr. Antoine PocheHochrein  We would like for you to get a lipid panel as so as your can with your new PCP

## 2014-09-08 NOTE — Progress Notes (Signed)
HPI The patient presents for followup up of atrial fibrillation and CAD.   Since I last saw her she has done relatively well. Her hemoglobin A1c was recently elevated at 6.7.  She is being managed medically for this. She did have one episode of chest discomfort. This happened last week. He was under her left breast. Some sharp. It happened at rest. She did take 2 nitroglycerin and it went away. She's had no other pain before or since then. She's able to do activities such as vacuuming without bringing on any symptoms. She has had no new shortness of breath, PND or orthopnea. He has had no weight gain or edema. She denies any palpitations, presyncope or syncope.  Allergies  Allergen Reactions  . Codeine Hives and Nausea And Vomiting  . Sulfonamide Derivatives Swelling    Turns red    Current Outpatient Prescriptions  Medication Sig Dispense Refill  . acetaminophen (TYLENOL) 500 MG tablet Take 500 mg by mouth every 6 (six) hours as needed for mild pain.      [name]. albuterol (PROVENTIL HFA;VENTOLIN HFA) 108 (90 BASE) MCG/ACT inhaler Inhale 2 puffs into the lungs every 6 (six) hours as needed for wheezing or shortness of breath.  1 Inhaler  2  . apixaban (ELIQUIS) 5 MG TABS tablet Take 0.5 tablets (2.5 mg total) by mouth 2 (two) times daily.  180 tablet  3  . furosemide (LASIX) 40 MG tablet Take 60 mg by mouth daily.      . isosorbide mononitrate (IMDUR) 60 MG 24 hr tablet Take 90 mg by mouth daily.      [name]. KLOR-CON 10 10 MEQ tablet TAKE 1 TABLET DAILY  90 tablet  1  . lansoprazole (PREVACID) 30 MG capsule Take 1 capsule (30 mg total) by mouth 2 (two) times daily.  180 capsule  3  . levothyroxine (SYNTHROID, LEVOTHROID) 50 MCG tablet Take 50 mcg by mouth daily before breakfast.      . nitroGLYCERIN (NITROSTAT) 0.4 MG SL tablet Place 1 tablet (0.4 mg total) under the tongue every 5 (five) minutes as needed for chest pain. If pain after 3 pills- call 911  20 tablet  2  . pentosan polysulfate (ELMIRON)  100 MG capsule Take 100 mg by mouth every morning.       . pravastatin (PRAVACHOL) 40 MG tablet Take 40 mg by mouth daily.      . valsartan (DIOVAN) 320 MG tablet Take 1 tablet (320 mg total) by mouth daily.  90 tablet  3  . [DISCONTINUED] potassium chloride (K-DUR,KLOR-CON) 10 MEQ tablet Take 1 tablet (10 mEq total) by mouth daily.  90 tablet  3   No current facility-administered medications for this visit.    Past Medical History  Diagnosis Date  . HYPERLIPIDEMIA 03/07/2008  . HYPERTENSION 08/03/2007  . CORONARY ARTERY DISEASE     a. s/p PCI to RCA '01. b. repeat PCI to RCA '03 2/2 ISR. c. cath 2012: RCA dz, rx medically.  . Acute on chronic systolic heart failure   . HYPOTHYROIDISM   . Esophageal reflux   . DIVERTICULOSIS, COLON   . BREAST MASS, BENIGN   . BARRETT'S ESOPHAGUS, HX OF   . Interstitial cystitis   . Arthritis   . Internal hemorrhoids   . Epistaxis   . Right hand fracture   . Bladder disorder   . Myocardial infarction   . Bradycardia 08/2012    a. Eval for symptomatic bradycardia 08/2012 - beta blocker discontinued.  .[name]  Orthostatic hypotension 08/2012    a. 08/2012 w/ syncope - meds adjusted.   . Atrial fibrillation     a. Dx 09/2013.  [name]. LBBB (left bundle branch block)   . History of breast lump/mass excision 05/23/2009    Qualifier: History of  By: Lovell SheehanJenkins MD, Balinda QuailsJohn E     Past Surgical History  Procedure Laterality Date  . Breast surgery      benign mass  . Cholecystectomy    . Cesarean section    . Appendectomy    . Cataract extraction, bilateral    . Tonsillectomy    . Abdominal hysterectomy    . Ankle reconstruction      Left  . Wrist reconstruction      Right  . Cardioversion N/A 10/20/2013    j    ROS:  As stated in the HPI and negative for all other systems.  PHYSICAL EXAM BP 132/89  Pulse 71  Ht 5\' 3"  (1.6 m)  Wt 157 lb (71.215 kg)  BMI 27.82 kg/m2 GENERAL:  Well appearing HEENT:  Pupils equal round and reactive, fundi not visualized,  oral mucosa unremarkable NECK:  No jugular venous distention, waveform within normal limits, carotid upstroke brisk and symmetric, no bruits, no thyromegaly LUNGS:  Decreased breath sounds in the left lung base with few basilar crackles. BACK:  No CVA tenderness CHEST: Unremarkable HEART:  PMI not displaced or sustained,S1 and S2 within normal limits, no S3, no s4 for her, no clicks, no rubs, no murmurs ABD:  Flat, positive bowel sounds normal in frequency in pitch, no bruits, no rebound, no guarding, no midline pulsatile mass, no hepatomegaly, no splenomegaly EXT:  2 plus pulses throughout, mild right greater than left edema, no cyanosis no clubbing  EKG:  Normal sinus rhythm, left bundle branch block, left axis deviation.  09/08/2014  ASSESSMENT AND PLAN  ATRIAL FIB -  She is maintaining NSR.  However, I would like for her to continue her NOAC.    CORONARY ARTERY DISEASE -  She is having some atypical pain.  However, this was one time.  She otherwise, since her catheterization in 2012, has had no recurrent symptoms. At this point I felt think further cardiovascular testing is indicated. She will however let me know if this becomes a more consistent pattern or she has increased symptoms. She will for now continued risk reduction.  HYPERTENSION -  Her blood pressure is well controlled. She will continue the meds as listed.  Diastolic HF (heart failure) - She seems to be euvolemic. No change in therapy is indicated.  No further cardiovascular testing is indicated.   Dyslipidemia - She has not had lipids since 2014.  I will order these.

## 2014-09-15 ENCOUNTER — Ambulatory Visit (INDEPENDENT_AMBULATORY_CARE_PROVIDER_SITE_OTHER): Payer: Medicare Other | Admitting: Family Medicine

## 2014-09-15 DIAGNOSIS — E785 Hyperlipidemia, unspecified: Secondary | ICD-10-CM

## 2014-09-15 DIAGNOSIS — E538 Deficiency of other specified B group vitamins: Secondary | ICD-10-CM | POA: Diagnosis not present

## 2014-09-15 LAB — LIPID PANEL
CHOL/HDL RATIO: 3
CHOLESTEROL: 129 mg/dL (ref 0–200)
HDL: 43.5 mg/dL (ref >39.00)
LDL Cholesterol: 69 mg/dL (ref 0–99)
NonHDL: 85.5
Triglycerides: 82 mg/dL (ref 0.0–149.0)
VLDL: 16.4 mg/dL (ref 0.0–40.0)

## 2014-09-15 MED ORDER — CYANOCOBALAMIN 1000 MCG/ML IJ SOLN
1000.0000 ug | Freq: Once | INTRAMUSCULAR | Status: AC
  Administered 2014-09-15: 1000 ug via INTRAMUSCULAR

## 2014-09-18 ENCOUNTER — Telehealth: Payer: Self-pay | Admitting: Family Medicine

## 2014-09-18 NOTE — Telephone Encounter (Signed)
Pt notifed of results

## 2014-09-18 NOTE — Telephone Encounter (Signed)
Pt is calling requesting lipid panel results

## 2014-09-29 ENCOUNTER — Ambulatory Visit (INDEPENDENT_AMBULATORY_CARE_PROVIDER_SITE_OTHER): Payer: Medicare Other | Admitting: Family Medicine

## 2014-09-29 DIAGNOSIS — E538 Deficiency of other specified B group vitamins: Secondary | ICD-10-CM | POA: Diagnosis not present

## 2014-09-29 MED ORDER — CYANOCOBALAMIN 1000 MCG/ML IJ SOLN
1000.0000 ug | Freq: Once | INTRAMUSCULAR | Status: AC
  Administered 2014-09-29: 1000 ug via INTRAMUSCULAR

## 2014-10-11 DIAGNOSIS — R351 Nocturia: Secondary | ICD-10-CM | POA: Diagnosis not present

## 2014-10-11 DIAGNOSIS — N3010 Interstitial cystitis (chronic) without hematuria: Secondary | ICD-10-CM | POA: Diagnosis not present

## 2014-10-11 DIAGNOSIS — N3941 Urge incontinence: Secondary | ICD-10-CM | POA: Diagnosis not present

## 2014-10-13 ENCOUNTER — Ambulatory Visit: Payer: Medicare Other | Admitting: Family Medicine

## 2014-10-17 ENCOUNTER — Telehealth: Payer: Self-pay | Admitting: Family Medicine

## 2014-10-17 NOTE — Telephone Encounter (Addendum)
Pt unable to come in due to transportation. Pt has cough , chest congestion w/phelgm and sore throat. pharm [facility] [address]. Pt is aware md out of office

## 2014-10-18 NOTE — Telephone Encounter (Signed)
Cannot diagnose based off symptoms. Would need office visit.

## 2014-10-18 NOTE — Telephone Encounter (Signed)
See below

## 2014-10-18 NOTE — Telephone Encounter (Signed)
Pt unable to sch an appt due to transportation. Pt will cb when she can get transportation

## 2014-10-18 NOTE — Telephone Encounter (Signed)
Dr. Hunter please advise. 

## 2014-10-27 ENCOUNTER — Ambulatory Visit (INDEPENDENT_AMBULATORY_CARE_PROVIDER_SITE_OTHER): Payer: Medicare Other | Admitting: Family Medicine

## 2014-10-27 ENCOUNTER — Encounter: Payer: Self-pay | Admitting: Family Medicine

## 2014-10-27 VITALS — BP 120/58 | HR 66 | Temp 98.1°F | Wt 154.0 lb

## 2014-10-27 DIAGNOSIS — I5032 Chronic diastolic (congestive) heart failure: Secondary | ICD-10-CM | POA: Diagnosis not present

## 2014-10-27 DIAGNOSIS — E119 Type 2 diabetes mellitus without complications: Secondary | ICD-10-CM | POA: Diagnosis not present

## 2014-10-27 DIAGNOSIS — E538 Deficiency of other specified B group vitamins: Secondary | ICD-10-CM | POA: Diagnosis not present

## 2014-10-27 LAB — CBC
HEMATOCRIT: 38.5 % (ref 36.0–46.0)
Hemoglobin: 12.6 g/dL (ref 12.0–15.0)
MCHC: 32.7 g/dL (ref 30.0–36.0)
MCV: 85.7 fl (ref 78.0–100.0)
Platelets: 214 10*3/uL (ref 150.0–400.0)
RBC: 4.49 Mil/uL (ref 3.87–5.11)
RDW: 14.3 % (ref 11.5–15.5)
WBC: 5.9 10*3/uL (ref 4.0–10.5)

## 2014-10-27 LAB — HEMOGLOBIN A1C: HEMOGLOBIN A1C: 6.5 % (ref 4.6–6.5)

## 2014-10-27 MED ORDER — ISOSORBIDE MONONITRATE ER 60 MG PO TB24
90.0000 mg | ORAL_TABLET | Freq: Every day | ORAL | Status: DC
Start: 2014-10-27 — End: 2015-11-22

## 2014-10-27 MED ORDER — POTASSIUM CHLORIDE ER 10 MEQ PO TBCR: 10.0000 meq | EXTENDED_RELEASE_TABLET | Freq: Every day | ORAL | Status: DC

## 2014-10-27 MED ORDER — PRAVASTATIN SODIUM 40 MG PO TABS: 40.0000 mg | ORAL_TABLET | Freq: Every day | ORAL | Status: DC

## 2014-10-27 MED ORDER — CYANOCOBALAMIN 1000 MCG/ML IJ SOLN
1000.0000 ug | Freq: Once | INTRAMUSCULAR | Status: AC
  Administered 2014-10-27: 1000 ug via INTRAMUSCULAR

## 2014-10-27 NOTE — Assessment & Plan Note (Signed)
Well-controlled with diet alone previously. Repeat A1c today.

## 2014-10-27 NOTE — Patient Instructions (Addendum)
You seem to have a resolving sinusitis. If your mild cough persists into next year or worsens, please come see us.   Glad you got the exercise bike out-that will be good for your diabetes.   Update your b12 today along with a1c.   Have your eye doctor send us a copy of his report and make sure to tell him you are diabetic when you see him in march.

## 2014-10-27 NOTE — Progress Notes (Signed)
Tana ConchStephen Devon Kingdon, MD Phone: [phone]  Subjective:   Victoria Gomez is a 80 y.o. year old very pleasant female patient who presents with the following:  DIABETES Type II-good control with diet alone  Lab Results  Component Value Date   HGBA1C 6.7* 07/28/2014   HGBA1C 6.5 11/18/2013   HGBA1C 6.8* 05/06/2011  Regular Exercise-no On Aspirin-eliquis On statin-pravastatin Daily foot monitoring-yes ROS- Denies Polyuria,Polydipsia, nocturia, Vision changes, feet numbness/pain/tingling. Denies Hypoglycemia symptoms (shaky, sweaty, hungry, weak anxious, tremor, palpitations, confusion, behavior change).   CHF stable Patient had cough/congestion/sore throat about 10 days ago. Mild sinus pressure which is now resolved No fevers/chills/nausea/vomiting. Symptoms have primarily cleared except for lingering cough and mild fatigue. She wants to make sure symptoms are not CHF related.  ROS- no orthopnea or PND. No leg edema. No shortness of breath. No increased weight other than about a week ago when she had 4 pound weight gain over 2 days and took an extra Lasix and weight returned to normal.  Vitamin B 12 deficiency and anemia Anemia was not microcytic but it resolved after supplementation with B 12. Patient did miss one dose of B-12. We will recheck B 12 this time ROS-no lower extremity weakness or numbness  Patient did bring a copy of her Living will which we will updated chart with.   Past Medical History- Patient Active Problem List   Diagnosis Date Noted  . Diabetes mellitus II 09/22/2013    Priority: High  . Atrial fibrillation 09/21/2013    Priority: High  . H/O sinus bradycardia 08/10/2012    Priority: High  . Diastolic HF (heart failure) 11/17/2011    Priority: High  . CORONARY ARTERY DISEASE 08/03/2007    Priority: High  . Vitamin B12 deficiency 07/28/2014    Priority: Medium  . CKD (chronic kidney disease), stage III 09/22/2013    Priority: Medium  . LBBB (left bundle  branch block) 09/22/2013    Priority: Medium  . Interstitial cystitis     Priority: Medium  . HYPERLIPIDEMIA 03/07/2008    Priority: Medium  . HYPOTHYROIDISM 08/03/2007    Priority: Medium  . HYPERTENSION 08/03/2007    Priority: Medium  . Internal hemorrhoids     Priority: Low  . Orthostatic hypotension 08/10/2012    Priority: Low  . Fatigue 04/20/2012    Priority: Low  . ARTHRITIS 12/30/2010    Priority: Low  . Left lumbar radiculopathy 12/11/2010    Priority: Low  . Esophageal reflux 10/18/2007    Priority: Low  . BARRETT'S ESOPHAGUS, HX OF 08/03/2007    Priority: Low   Medications- reviewed and updated Current Outpatient Prescriptions  Medication Sig Dispense Refill  . apixaban (ELIQUIS) 2.5 MG TABS tablet Take 1 tablet (2.5 mg total) by mouth 2 (two) times daily. 180 tablet 3  . furosemide (LASIX) 40 MG tablet Take 60 mg by mouth daily.    . isosorbide mononitrate (IMDUR) 60 MG 24 hr tablet Take 1.5 tablets (90 mg total) by mouth daily. 135 tablet 3  . lansoprazole (PREVACID) 30 MG capsule Take 1 capsule (30 mg total) by mouth 2 (two) times daily. 180 capsule 3  . levothyroxine (SYNTHROID, LEVOTHROID) 50 MCG tablet Take 50 mcg by mouth daily before breakfast.    . pentosan polysulfate (ELMIRON) 100 MG capsule Take 100 mg by mouth every morning.     . potassium chloride (KLOR-CON 10) 10 MEQ tablet Take 1 tablet (10 mEq total) by mouth daily. 90 tablet 3  . pravastatin (PRAVACHOL)  40 MG tablet Take 1 tablet (40 mg total) by mouth daily. 90 tablet 3  . valsartan (DIOVAN) 320 MG tablet Take 1 tablet (320 mg total) by mouth daily. 90 tablet 3  . acetaminophen (TYLENOL) 500 MG tablet Take 500 mg by mouth every 6 (six) hours as needed for mild pain.    [name]. albuterol (PROVENTIL HFA;VENTOLIN HFA) 108 (90 BASE) MCG/ACT inhaler Inhale 2 puffs into the lungs every 6 (six) hours as needed for wheezing or shortness of breath. (Patient not taking: Reported on 10/27/2014) 1 Inhaler 2  .  nitroGLYCERIN (NITROSTAT) 0.4 MG SL tablet Place 1 tablet (0.4 mg total) under the tongue every 5 (five) minutes as needed for chest pain. If pain after 3 pills- call 911 (Patient not taking: Reported on 10/27/2014) 20 tablet 2  . [DISCONTINUED] potassium chloride (K-DUR,KLOR-CON) 10 MEQ tablet Take 1 tablet (10 mEq total) by mouth daily. 90 tablet 3   No current facility-administered medications for this visit.    Objective: BP 120/58 mmHg  Pulse 66  Temp(Src) 98.1 F (36.7 C)  Wt 154 lb (69.854 kg) Gen: NAD, resting comfortably, appears stated age TM normal, oropharynx normal, turbinates mildly erythematous, no sinus pressure CV: RRR no murmurs rubs or gallops Lungs: CTAB no crackles, wheeze, rhonchi Abdomen: soft/nontender/nondistended/normal bowel sounds.  Ext: no pitting edema Skin: warm, dry, no rash Neuro: grossly normal, moves all extremities   DM foot exam normal   Assessment/Plan:  Diabetes mellitus II Well-controlled with diet alone previously. Repeat A1c today.   Diastolic HF (heart failure) I reassured the patient that her recent symptoms sound much more like an upper respiratory infection or viral sinusitis that is slowly improving than related to her heart failure. On exam she does not look fluid overloaded and her weight is actually down 3 pounds from last visit. Continue Lasix 60 mg daily.  Vitamin B12 deficiency Continue monthly injections and recheck B-12 today. Also check CBC to follow-up anemia  Return precautions advised. See me in 4 months  Nonfasting Orders Placed This Encounter  Procedures  . Vitamin B12  . Hemoglobin A1c    [place]  . CBC    [place]   Meds ordered this encounter  Medications  . pravastatin (PRAVACHOL) 40 MG tablet    Sig: Take 1 tablet (40 mg total) by mouth daily.    Dispense:  90 tablet    Refill:  3  . isosorbide mononitrate (IMDUR) 60 MG 24 hr tablet    Sig: Take 1.5 tablets (90 mg total) by mouth daily.    Dispense:   135 tablet    Refill:  3  . potassium chloride (KLOR-CON 10) 10 MEQ tablet    Sig: Take 1 tablet (10 mEq total) by mouth daily.    Dispense:  90 tablet    Refill:  3  . cyanocobalamin ((VITAMIN B-12)) injection 1,000 mcg    Sig:

## 2014-10-27 NOTE — Assessment & Plan Note (Signed)
Continue monthly injections and recheck B-12 today. Also check CBC to follow-up anemia

## 2014-10-27 NOTE — Assessment & Plan Note (Signed)
I reassured the patient that her recent symptoms sound much more like an upper respiratory infection or viral sinusitis that is slowly improving than related to her heart failure. On exam she does not look fluid overloaded and her weight is actually down 3 pounds from last visit. Continue Lasix 60 mg daily.

## 2014-10-30 ENCOUNTER — Other Ambulatory Visit: Payer: Medicare Other

## 2014-10-30 DIAGNOSIS — E538 Deficiency of other specified B group vitamins: Secondary | ICD-10-CM

## 2014-10-31 LAB — VITAMIN B12: Vitamin B-12: 652 pg/mL (ref 211–911)

## 2014-11-13 ENCOUNTER — Ambulatory Visit: Payer: Medicare Other | Admitting: Family Medicine

## 2015-01-22 ENCOUNTER — Other Ambulatory Visit: Payer: Self-pay

## 2015-01-22 MED ORDER — FUROSEMIDE 40 MG PO TABS: 60.0000 mg | ORAL_TABLET | Freq: Every day | ORAL | Status: DC

## 2015-01-22 NOTE — Telephone Encounter (Signed)
Rx request for Furosemide 40 mg tablet-Take 1 and 1/2 tablet by mouth daily #135  Pharm:  [facility] [address]  Pls advise.

## 2015-01-22 NOTE — Telephone Encounter (Signed)
1.5 tabs is correct (1 and 1/2 tablet or 60mg )

## 2015-01-22 NOTE — Telephone Encounter (Signed)
Should pt be taking 1 1/2 tab or only 1 tab daily?

## 2015-01-22 NOTE — Telephone Encounter (Signed)
Medication refilled

## 2015-02-23 ENCOUNTER — Telehealth: Payer: Self-pay | Admitting: Cardiology

## 2015-02-23 ENCOUNTER — Ambulatory Visit (INDEPENDENT_AMBULATORY_CARE_PROVIDER_SITE_OTHER): Payer: Medicare Other | Admitting: Family Medicine

## 2015-02-23 ENCOUNTER — Encounter: Payer: Self-pay | Admitting: Family Medicine

## 2015-02-23 VITALS — BP 122/78 | HR 79 | Temp 98.1°F | Wt 160.0 lb

## 2015-02-23 DIAGNOSIS — I480 Paroxysmal atrial fibrillation: Secondary | ICD-10-CM

## 2015-02-23 DIAGNOSIS — E038 Other specified hypothyroidism: Secondary | ICD-10-CM | POA: Diagnosis not present

## 2015-02-23 DIAGNOSIS — Z23 Encounter for immunization: Secondary | ICD-10-CM | POA: Diagnosis not present

## 2015-02-23 DIAGNOSIS — E034 Atrophy of thyroid (acquired): Secondary | ICD-10-CM

## 2015-02-23 DIAGNOSIS — E119 Type 2 diabetes mellitus without complications: Secondary | ICD-10-CM | POA: Diagnosis not present

## 2015-02-23 LAB — HEMOGLOBIN A1C: Hgb A1c MFr Bld: 6.4 % (ref 4.6–6.5)

## 2015-02-23 LAB — TSH: TSH: 1.34 u[IU]/mL (ref 0.35–4.50)

## 2015-02-23 NOTE — Telephone Encounter (Signed)
I discussed this case with Tana ConchHUNTER, STEPHEN, MD today.  No action needed.

## 2015-02-23 NOTE — Patient Instructions (Addendum)
Received final pnuemonia shot today ([id number]).  Have eye exam results faxed to [phone].  Check a1c and thyroid today  You are in atrial flutter today. I want you to take it easy with the cleaning. Continue to monitor your HR and follow up with Dr. Antoine PocheHochrein in 2 weeks.   If you have continued episodes of chest pain when you are being less active, then please give me a call but I think these episodes are related to your increased activity level.

## 2015-02-23 NOTE — Assessment & Plan Note (Addendum)
History of atrial fibrillation. In a flutter today. Discussed by phone with Dr. Antoine PocheHochrein and she has follow up in 2 weeks with him. No acute concerns. Continue eliquis. Seek care if rapid HR persists.   Her chest pain has come with a drastic increase in activity level so I do not think overall that her chest pain pattern has increased in frequency. EKG reviewed with Dr. Antoine PocheHochrein by phone. I did not refer to ED at this time but asked patient to take it easy until cards appointment and if was having chest pain with her normal activities to call or seek care

## 2015-02-23 NOTE — Assessment & Plan Note (Signed)
Due to a flutter checked TSh which was well controlled on levothyroxine 50mcg Lab Results  Component Value Date   TSH 1.34 02/23/2015

## 2015-02-23 NOTE — Assessment & Plan Note (Signed)
a1c has improved to 6.4 without medications. Patient remains a diabetic by definition with prior a1c's >6.5. Continue diet control.

## 2015-02-23 NOTE — Progress Notes (Addendum)
Tana ConchStephen Brittian Renaldo, MD Phone: [phone]  Subjective:   Victoria Gomez is a 81 y.o. year old very pleasant female patient who presents with the following:  Atrial Fibrillation-anticoagulated on eliquis, has not tolerated rate control due to sinus bradycardia when out of a fib History of a fib that hs not recurred since cardioverted in 2014. Patient has been monitoring her HR at home and has noted a change from 60s to 80-110 range.  Chest pain she had a backup of sewage into her house and has had to do a lot more cleaning than usual, with this activity she has had chest pain 3x in last month most recently Wednesday AM but each time resolved with nitroglycerin, most recently x 2. This is a drastic increase in her regular activity level.  ROS- denies  shortness of breath, palpitations  DIABETES Type II-good control with diet alone  Lab Results  Component Value Date   HGBA1C 6.4 02/23/2015  wants meter-provided but instructed does not need to routinely check Regular Exercise-no On Aspirin-eliquis On statin-pravastatin Daily foot monitoring-yes ROS- Denies Polyuria,Polydipsia, nocturia, Vision changes, feet numbness/pain/tingling. Denies Hypoglycemia symptoms (shaky, sweaty, hungry, weak anxious, tremor, palpitations, confusion, behavior change).   Hypothyroidism-controlled previously  Lab Results  Component Value Date   TSH 1.34 02/23/2015   On thyroid medication-levothyroxine 50mcg ROS-No hair or nail changes. No heat/cold intolerance. No constipation or diarrhea. Denies shakiness or anxiety.   Past Medical History- Patient Active Problem List   Diagnosis Date Noted  . Diabetes mellitus II 09/22/2013    Priority: High  . Atrial fibrillation 09/21/2013    Priority: High  . H/O sinus bradycardia 08/10/2012    Priority: High  . Diastolic HF (heart failure) 11/17/2011    Priority: High  . CORONARY ARTERY DISEASE 08/03/2007    Priority: High  . Vitamin B12 deficiency 07/28/2014   Priority: Medium  . CKD (chronic kidney disease), stage III 09/22/2013    Priority: Medium  . LBBB (left bundle branch block) 09/22/2013    Priority: Medium  . Interstitial cystitis     Priority: Medium  . HYPERLIPIDEMIA 03/07/2008    Priority: Medium  . Hypothyroidism 08/03/2007    Priority: Medium  . HYPERTENSION 08/03/2007    Priority: Medium  . Internal hemorrhoids     Priority: Low  . Orthostatic hypotension 08/10/2012    Priority: Low  . Fatigue 04/20/2012    Priority: Low  . ARTHRITIS 12/30/2010    Priority: Low  . Left lumbar radiculopathy 12/11/2010    Priority: Low  . Esophageal reflux 10/18/2007    Priority: Low  . BARRETT'S ESOPHAGUS, HX OF 08/03/2007    Priority: Low   Medications- reviewed and updated Current Outpatient Prescriptions  Medication Sig Dispense Refill  . apixaban (ELIQUIS) 2.5 MG TABS tablet Take 1 tablet (2.5 mg total) by mouth 2 (two) times daily. 180 tablet 3  . furosemide (LASIX) 40 MG tablet Take 1.5 tablets (60 mg total) by mouth daily. 60 tablet 1  . isosorbide mononitrate (IMDUR) 60 MG 24 hr tablet Take 1.5 tablets (90 mg total) by mouth daily. 135 tablet 3  . lansoprazole (PREVACID) 30 MG capsule Take 1 capsule (30 mg total) by mouth 2 (two) times daily. 180 capsule 3  . levothyroxine (SYNTHROID, LEVOTHROID) 50 MCG tablet Take 50 mcg by mouth daily before breakfast.    . pentosan polysulfate (ELMIRON) 100 MG capsule Take 100 mg by mouth every morning.     . potassium chloride (KLOR-CON 10) 10 MEQ  tablet Take 1 tablet (10 mEq total) by mouth daily. 90 tablet 3  . pravastatin (PRAVACHOL) 40 MG tablet Take 1 tablet (40 mg total) by mouth daily. 90 tablet 3  . valsartan (DIOVAN) 320 MG tablet Take 1 tablet (320 mg total) by mouth daily. 90 tablet 3  . acetaminophen (TYLENOL) 500 MG tablet Take 500 mg by mouth every 6 (six) hours as needed for mild pain.    [name] albuterol (PROVENTIL HFA;VENTOLIN HFA) 108 (90 BASE) MCG/ACT inhaler Inhale 2 puffs  into the lungs every 6 (six) hours as needed for wheezing or shortness of breath. (Patient not taking: Reported on 10/27/2014) 1 Inhaler 2  . nitroGLYCERIN (NITROSTAT) 0.4 MG SL tablet Place 1 tablet (0.4 mg total) under the tongue every 5 (five) minutes as needed for chest pain. If pain after 3 pills- call 911 (Patient not taking: Reported on 10/27/2014) 20 tablet 2  . [DISCONTINUED] potassium chloride (K-DUR,KLOR-CON) 10 MEQ tablet Take 1 tablet (10 mEq total) by mouth daily. 90 tablet 3   No current facility-administered medications for this visit.   EKG shows Atrial flutter with rate 75. Left axis. St elevations <2 boxes v3-v6. < 1 box in II, III, Avf.  Discussed with Dr. Antoine Poche by phone who did not have concern for acute st-changes when compared to prior as prior EKGs have similar St elevation appearence.   Objective: BP 122/78 mmHg  Pulse 79  Temp(Src) 98.1 F (36.7 C)  Wt 160 lb (72.576 kg) Gen: NAD, resting comfortably CV: irregularly irregular no murmurs rubs or gallops Lungs: CTAB no crackles, wheeze, rhonchi Abdomen: soft/nontender/nondistended/normal bowel sounds. No rebound or guarding.  Ext: no edema Skin: warm, dry, no rash   Assessment/Plan:  Diabetes mellitus II a1c has improved to 6.4 without medications. Patient remains a diabetic by definition with prior a1c's >6.5. Continue diet control.    Atrial fibrillation History of atrial fibrillation. In a flutter today. Discussed by phone with Dr. Antoine Poche and she has follow up in 2 weeks with him. No acute concerns. Continue eliquis. Seek care if rapid HR persists.   Her chest pain has come with a drastic increase in activity level so I do not think overall that her chest pain pattern has increased in frequency. EKG reviewed with Dr. Antoine Poche by phone. I did not refer to ED at this time but asked patient to take it easy until cards appointment and if was having chest pain with her normal activities to call or seek  care   Hypothyroidism Due to a flutter checked TSh which was well controlled on levothyroxine Lab Results  Component Value Date   TSH 1.34 02/23/2015      4 months.   Orders Placed This Encounter  Procedures  . Pneumococcal conjugate vaccine 13-valent  . EKG 12-Lead   Results for orders placed or performed in visit on 02/23/15 (from the past 24 hour(s))  TSH     Status: None   Collection Time: 02/23/15 10:53 AM  Result Value Ref Range   TSH 1.34 0.35 - 4.50 uIU/mL  Hemoglobin A1c     Status: None   Collection Time: 02/23/15 10:53 AM  Result Value Ref Range   Hgb A1c MFr Bld 6.4 4.6 - 6.5 %

## 2015-03-08 DIAGNOSIS — H35033 Hypertensive retinopathy, bilateral: Secondary | ICD-10-CM | POA: Diagnosis not present

## 2015-03-08 DIAGNOSIS — H35373 Puckering of macula, bilateral: Secondary | ICD-10-CM | POA: Diagnosis not present

## 2015-03-08 DIAGNOSIS — E119 Type 2 diabetes mellitus without complications: Secondary | ICD-10-CM | POA: Diagnosis not present

## 2015-03-08 DIAGNOSIS — H179 Unspecified corneal scar and opacity: Secondary | ICD-10-CM | POA: Diagnosis not present

## 2015-03-08 LAB — HM DIABETES EYE EXAM

## 2015-03-09 ENCOUNTER — Ambulatory Visit (INDEPENDENT_AMBULATORY_CARE_PROVIDER_SITE_OTHER): Payer: Medicare Other | Admitting: Cardiology

## 2015-03-09 ENCOUNTER — Encounter: Payer: Self-pay | Admitting: Cardiology

## 2015-03-09 VITALS — BP 154/86 | HR 79 | Ht 63.0 in | Wt 159.2 lb

## 2015-03-09 DIAGNOSIS — I480 Paroxysmal atrial fibrillation: Secondary | ICD-10-CM

## 2015-03-09 DIAGNOSIS — R079 Chest pain, unspecified: Secondary | ICD-10-CM

## 2015-03-09 MED ORDER — METOPROLOL SUCCINATE ER 25 MG PO TB24: 25.0000 mg | ORAL_TABLET | Freq: Every day | ORAL | Status: DC

## 2015-03-09 MED ORDER — METOPROLOL SUCCINATE ER 25 MG PO TB24
25.0000 mg | ORAL_TABLET | Freq: Every day | ORAL | Status: DC
Start: 2015-03-09 — End: 2015-05-04

## 2015-03-09 NOTE — Progress Notes (Signed)
HPI The patient presents for followup up of atrial fibrillation and CAD.  She was recently found to be back in atrial fibrillation. She was noticing an increased heart rate since March.  She says she's felt fatigued. She's been keeping blood pressure and heart rate in her heart rate has been as high as 117 and is particularly elevated in the morning lower during the day. She hasn't had presyncope or syncope. She has had chest discomfort. This has been some left upper discomfort. It is sporadic. She's had to take about 3 nitroglycerin in the last month or so. She's had some blurred vision and is to see an ophthalmologist. She's had some nosebleeds. She has had some discomfort in the top of her head. There's been some associated nausea but no vomiting. She just overall doesn't feel well.  Allergies  Allergen Reactions  . Codeine Hives and Nausea And Vomiting  . Sulfonamide Derivatives Swelling    Turns red    Current Outpatient Prescriptions  Medication Sig Dispense Refill  . acetaminophen (TYLENOL) 500 MG tablet Take 500 mg by mouth every 6 (six) hours as needed for mild pain.    [name] apixaban (ELIQUIS) 2.5 MG TABS tablet Take 1 tablet (2.5 mg total) by mouth 2 (two) times daily. 180 tablet 3  . furosemide (LASIX) 40 MG tablet Take 1.5 tablets (60 mg total) by mouth daily. 60 tablet 1  . isosorbide mononitrate (IMDUR) 60 MG 24 hr tablet Take 1.5 tablets (90 mg total) by mouth daily. 135 tablet 3  . lansoprazole (PREVACID) 30 MG capsule Take 1 capsule (30 mg total) by mouth 2 (two) times daily. 180 capsule 3  . levothyroxine (SYNTHROID, LEVOTHROID) 50 MCG tablet Take 50 mcg by mouth daily before breakfast.    . nitroGLYCERIN (NITROSTAT) 0.4 MG SL tablet Place 1 tablet (0.4 mg total) under the tongue every 5 (five) minutes as needed for chest pain. If pain after 3 pills- call 911 20 tablet 2  . pentosan polysulfate (ELMIRON) 100 MG capsule Take 100 mg by mouth every morning.     . potassium  chloride (KLOR-CON 10) 10 MEQ tablet Take 1 tablet (10 mEq total) by mouth daily. 90 tablet 3  . pravastatin (PRAVACHOL) 40 MG tablet Take 1 tablet (40 mg total) by mouth daily. 90 tablet 3  . valsartan (DIOVAN) 320 MG tablet Take 1 tablet (320 mg total) by mouth daily. 90 tablet 3  . [DISCONTINUED] potassium chloride (K-DUR,KLOR-CON) 10 MEQ tablet Take 1 tablet (10 mEq total) by mouth daily. 90 tablet 3   No current facility-administered medications for this visit.    Past Medical History  Diagnosis Date  . HYPERLIPIDEMIA 03/07/2008  . HYPERTENSION 08/03/2007  . CORONARY ARTERY DISEASE     a. s/p PCI to RCA '01. b. repeat PCI to RCA '03 2/2 ISR. c. cath 2012: RCA dz, rx medically.  Occluded RCA.  Nonobstructive disease elsewhere  . Acute on chronic systolic heart failure     EF 82%  . HYPOTHYROIDISM   . Esophageal reflux   . DIVERTICULOSIS, COLON   . BREAST MASS, BENIGN   . BARRETT'S ESOPHAGUS, HX OF   . Interstitial cystitis   . Arthritis   . Internal hemorrhoids   . Epistaxis   . Right hand fracture   . Bladder disorder   . Bradycardia 08/2012    a. Eval for symptomatic bradycardia 08/2012 - beta blocker discontinued.  . Orthostatic hypotension 08/2012    a. 08/2012 w/ syncope -  meds adjusted.   . Atrial fibrillation     a. Dx 09/2013.  [name]. LBBB (left bundle branch block)   . History of breast lump/mass excision 05/23/2009    Qualifier: History of  By: Lovell SheehanJenkins MD, Balinda QuailsJohn E     Past Surgical History  Procedure Laterality Date  . Breast surgery      benign mass  . Cholecystectomy    . Cesarean section    . Appendectomy    . Cataract extraction, bilateral    . Tonsillectomy    . Abdominal hysterectomy    . Ankle reconstruction      Left  . Wrist reconstruction      Right  . Cardioversion N/A 10/20/2013    ROS:  As stated in the HPI and negative for all other systems.  PHYSICAL EXAM BP 154/86 mmHg  Pulse 79  Ht 5\' 3"  (1.6 m)  Wt 159 lb 3.2 oz (72.213 kg)  BMI  28.21 kg/m2 GENERAL:  Well appearing HEENT:  Pupils equal round and reactive, fundi not visualized, oral mucosa unremarkable NECK:  No jugular venous distention, waveform within normal limits, carotid upstroke brisk and symmetric, no bruits, no thyromegaly LUNGS:  Decreased breath sounds in the left lung base with few basilar crackles. BACK:  No CVA tenderness CHEST: Unremarkable HEART:  PMI not displaced or sustained,S1 and S2 within normal limits, no S3, for her, no clicks, no rubs, no murmurs, irregular ABD:  Flat, positive bowel sounds normal in frequency in pitch, no bruits, no rebound, no guarding, no midline pulsatile mass, no hepatomegaly, no splenomegaly EXT:  2 plus pulses throughout, mild right greater than left edema, no cyanosis no clubbing NEURO:  Nonfocal, cranial nerves II through XII grossly intact, motor grossly intact  EKG:  Atrial fibrillation, rate 91 left bundle branch block, left axis deviation.  03/09/2015  ASSESSMENT AND PLAN  ATRIAL FIB -  I will continue her NOAC.  I will add metoprolol 25 mg daily. Likely I will plan cardioversion after testing below.  CORONARY ARTERY DISEASE -  She is having some atypical pain.  I will add a beta blocker as above. I'm going to screen her with a stress perfusion study. This will be somewhat difficult to interpret if she has a known occluded right coronary artery. However, I want to look for high risk findings in the other coronary distributions.   HYPERTENSION -  Her blood pressure is well controlled. She will continue the meds as listed.  Diastolic HF (heart failure) - She seems to be euvolemic. No change in therapy is indicated.  No further cardiovascular testing is indicated.   Dyslipidemia -   her LDL was 69 last year with an HDL of 43. She will continue the meds as listed.

## 2015-03-09 NOTE — Patient Instructions (Addendum)
START Toprol XL 25mg  Daily  Your physician recommends that you schedule a follow-up appointment in:6 weeks  Your physician has requested that you have en exercise stress myoview. For further information please visit [email or web address]. Please follow instruction sheet, as given.

## 2015-03-15 ENCOUNTER — Encounter: Payer: Self-pay | Admitting: Family Medicine

## 2015-03-16 ENCOUNTER — Other Ambulatory Visit (HOSPITAL_COMMUNITY): Payer: Self-pay | Admitting: Ophthalmology

## 2015-03-16 DIAGNOSIS — H35033 Hypertensive retinopathy, bilateral: Secondary | ICD-10-CM | POA: Diagnosis not present

## 2015-03-16 DIAGNOSIS — E119 Type 2 diabetes mellitus without complications: Secondary | ICD-10-CM | POA: Diagnosis not present

## 2015-03-16 DIAGNOSIS — H35363 Drusen (degenerative) of macula, bilateral: Secondary | ICD-10-CM | POA: Diagnosis not present

## 2015-03-16 DIAGNOSIS — H43812 Vitreous degeneration, left eye: Secondary | ICD-10-CM | POA: Diagnosis not present

## 2015-03-16 DIAGNOSIS — H35371 Puckering of macula, right eye: Secondary | ICD-10-CM | POA: Diagnosis not present

## 2015-03-16 DIAGNOSIS — H43811 Vitreous degeneration, right eye: Secondary | ICD-10-CM | POA: Diagnosis not present

## 2015-03-16 DIAGNOSIS — I998 Other disorder of circulatory system: Secondary | ICD-10-CM

## 2015-03-16 DIAGNOSIS — H5462 Unqualified visual loss, left eye, normal vision right eye: Secondary | ICD-10-CM | POA: Diagnosis not present

## 2015-03-21 ENCOUNTER — Other Ambulatory Visit: Payer: Self-pay | Admitting: *Deleted

## 2015-03-21 ENCOUNTER — Telehealth (HOSPITAL_COMMUNITY): Payer: Self-pay

## 2015-03-21 MED ORDER — LEVOTHYROXINE SODIUM 50 MCG PO TABS: 50.0000 ug | ORAL_TABLET | Freq: Every day | ORAL | Status: DC

## 2015-03-21 NOTE — Telephone Encounter (Signed)
Encounter complete. 

## 2015-03-22 ENCOUNTER — Ambulatory Visit (HOSPITAL_COMMUNITY)
Admission: RE | Admit: 2015-03-22 | Discharge: 2015-03-22 | Disposition: A | Discharge status: Home / Self Care | Payer: Medicare Other | Source: Ambulatory Visit | Attending: Internal Medicine | Admitting: Internal Medicine

## 2015-03-22 DIAGNOSIS — I6523 Occlusion and stenosis of bilateral carotid arteries: Secondary | ICD-10-CM | POA: Diagnosis not present

## 2015-03-22 DIAGNOSIS — R42 Dizziness and giddiness: Secondary | ICD-10-CM | POA: Insufficient documentation

## 2015-03-22 DIAGNOSIS — I998 Other disorder of circulatory system: Secondary | ICD-10-CM | POA: Diagnosis not present

## 2015-03-22 DIAGNOSIS — H3400 Transient retinal artery occlusion, unspecified eye: Secondary | ICD-10-CM | POA: Diagnosis not present

## 2015-03-22 NOTE — Progress Notes (Signed)
VASCULAR LAB PRELIMINARY  PRELIMINARY  PRELIMINARY  PRELIMINARY  Carotid Duplex completed.    Preliminary report:  Moderate heterogeneous plaque carotid bifurcations bilaterally.  Ratios and velocities are within 1-39% range of stenosis bilaterally.   Vertebral arteries are patent and antegrade bilaterally.  Loralie ChampagneBishop, Marcianne Ozbun F, RVT 03/22/2015, 2:34 PM

## 2015-03-23 ENCOUNTER — Ambulatory Visit (HOSPITAL_COMMUNITY)
Admission: RE | Admit: 2015-03-23 | Discharge: 2015-03-23 | Disposition: A | Discharge status: Home / Self Care | Payer: Medicare Other | Source: Ambulatory Visit | Attending: Cardiovascular Disease | Admitting: Cardiovascular Disease

## 2015-03-23 DIAGNOSIS — R0609 Other forms of dyspnea: Secondary | ICD-10-CM | POA: Insufficient documentation

## 2015-03-23 DIAGNOSIS — Z9861 Coronary angioplasty status: Secondary | ICD-10-CM | POA: Insufficient documentation

## 2015-03-23 DIAGNOSIS — E119 Type 2 diabetes mellitus without complications: Secondary | ICD-10-CM | POA: Diagnosis not present

## 2015-03-23 DIAGNOSIS — I509 Heart failure, unspecified: Secondary | ICD-10-CM | POA: Insufficient documentation

## 2015-03-23 DIAGNOSIS — I2510 Atherosclerotic heart disease of native coronary artery without angina pectoris: Secondary | ICD-10-CM | POA: Diagnosis not present

## 2015-03-23 DIAGNOSIS — R42 Dizziness and giddiness: Secondary | ICD-10-CM | POA: Insufficient documentation

## 2015-03-23 DIAGNOSIS — R079 Chest pain, unspecified: Secondary | ICD-10-CM | POA: Diagnosis not present

## 2015-03-23 DIAGNOSIS — Z6828 Body mass index (BMI) 28.0-28.9, adult: Secondary | ICD-10-CM | POA: Diagnosis not present

## 2015-03-23 DIAGNOSIS — N189 Chronic kidney disease, unspecified: Secondary | ICD-10-CM | POA: Diagnosis not present

## 2015-03-23 DIAGNOSIS — R5383 Other fatigue: Secondary | ICD-10-CM | POA: Insufficient documentation

## 2015-03-23 DIAGNOSIS — I129 Hypertensive chronic kidney disease with stage 1 through stage 4 chronic kidney disease, or unspecified chronic kidney disease: Secondary | ICD-10-CM | POA: Diagnosis not present

## 2015-03-23 DIAGNOSIS — E663 Overweight: Secondary | ICD-10-CM | POA: Diagnosis not present

## 2015-03-23 DIAGNOSIS — R110 Nausea: Secondary | ICD-10-CM | POA: Diagnosis not present

## 2015-03-23 LAB — MYOCARDIAL PERFUSION IMAGING
CHL CUP STRESS STAGE 1 HR: 80 {beats}/min
CHL CUP STRESS STAGE 2 HR: 77 {beats}/min
CHL CUP STRESS STAGE 2 SPEED: 0 mph
CHL CUP STRESS STAGE 3 DBP: 100 mmHg
CHL CUP STRESS STAGE 3 HR: 129 {beats}/min
CHL CUP STRESS STAGE 3 SPEED: 0 mph
CHL CUP STRESS STAGE 4 HR: 80 {beats}/min
CHL CUP STRESS STAGE 4 SPEED: 0 mph
Estimated workload: 1 METS
LV dias vol: 57 mL
LV sys vol: 20 mL
NUC STRESS EF: 63 %
Peak BP: 160 mmHg
Peak HR: 129 {beats}/min
Percent of predicted max HR: 97 %
Rest HR: 84 {beats}/min
SDS: 3
SRS: 1
SSS: 4
Stage 1 DBP: 75 mmHg
Stage 1 Grade: 0 %
Stage 1 SBP: 144 mmHg
Stage 1 Speed: 0 mph
Stage 2 Grade: 0 %
Stage 3 Grade: 0 %
Stage 3 SBP: 160 mmHg
Stage 4 DBP: 91 mmHg
Stage 4 Grade: 0 %
Stage 4 SBP: 156 mmHg
TID: 1.05

## 2015-03-23 MED ORDER — AMINOPHYLLINE 25 MG/ML IV SOLN
75.0000 mg | Freq: Once | INTRAVENOUS | Status: AC
  Administered 2015-03-23: 75 mg via INTRAVENOUS

## 2015-03-23 MED ORDER — TECHNETIUM TC 99M SESTAMIBI GENERIC - CARDIOLITE
30.9000 | Freq: Once | INTRAVENOUS | Status: AC | PRN
  Administered 2015-03-23: 30.9 via INTRAVENOUS

## 2015-03-23 MED ORDER — TECHNETIUM TC 99M SESTAMIBI GENERIC - CARDIOLITE
10.6000 | Freq: Once | INTRAVENOUS | Status: AC | PRN
Start: 2015-03-23 — End: 2015-03-23
  Administered 2015-03-23: 11 via INTRAVENOUS

## 2015-03-23 MED ORDER — REGADENOSON 0.4 MG/5ML IV SOLN
0.4000 mg | Freq: Once | INTRAVENOUS | Status: AC
  Administered 2015-03-23: 0.4 mg via INTRAVENOUS

## 2015-03-29 ENCOUNTER — Telehealth: Payer: Self-pay | Admitting: Cardiology

## 2015-03-29 ENCOUNTER — Other Ambulatory Visit: Payer: Self-pay | Admitting: *Deleted

## 2015-03-29 DIAGNOSIS — I482 Chronic atrial fibrillation, unspecified: Secondary | ICD-10-CM

## 2015-03-29 NOTE — Telephone Encounter (Signed)
Spoke with pt, she knows the results of her stress test but wonders what we are going to do about the atrial fib. She has an appt in 6 weeks, does she need to come in sooner? Will forward for dr hochrein's review.

## 2015-03-29 NOTE — Telephone Encounter (Signed)
Schedule DCCV.  Please make sure that she has been on three weeks of uninterrupted NOAC.

## 2015-03-29 NOTE — Telephone Encounter (Signed)
Please call,concerning her stress test. She have some questions to ask.

## 2015-04-03 ENCOUNTER — Other Ambulatory Visit: Payer: Self-pay | Admitting: *Deleted

## 2015-04-03 ENCOUNTER — Encounter: Payer: Self-pay | Admitting: Cardiology

## 2015-04-03 ENCOUNTER — Telehealth: Payer: Self-pay | Admitting: *Deleted

## 2015-04-03 DIAGNOSIS — E878 Other disorders of electrolyte and fluid balance, not elsewhere classified: Secondary | ICD-10-CM

## 2015-04-03 DIAGNOSIS — D689 Coagulation defect, unspecified: Secondary | ICD-10-CM

## 2015-04-03 DIAGNOSIS — R531 Weakness: Secondary | ICD-10-CM

## 2015-04-03 NOTE — Telephone Encounter (Signed)
Pt. Informed about her upcoming cardioversion with Dr. Delton SeeNelson on 04-24-15 at 1:00 pm

## 2015-04-06 DIAGNOSIS — H35033 Hypertensive retinopathy, bilateral: Secondary | ICD-10-CM | POA: Diagnosis not present

## 2015-04-06 DIAGNOSIS — H5462 Unqualified visual loss, left eye, normal vision right eye: Secondary | ICD-10-CM | POA: Diagnosis not present

## 2015-04-06 DIAGNOSIS — E119 Type 2 diabetes mellitus without complications: Secondary | ICD-10-CM | POA: Diagnosis not present

## 2015-04-06 DIAGNOSIS — H35371 Puckering of macula, right eye: Secondary | ICD-10-CM | POA: Diagnosis not present

## 2015-04-19 ENCOUNTER — Other Ambulatory Visit: Payer: Self-pay | Admitting: *Deleted

## 2015-04-19 ENCOUNTER — Telehealth: Payer: Self-pay | Admitting: Cardiology

## 2015-04-19 DIAGNOSIS — R531 Weakness: Secondary | ICD-10-CM | POA: Diagnosis not present

## 2015-04-19 DIAGNOSIS — D689 Coagulation defect, unspecified: Secondary | ICD-10-CM

## 2015-04-19 DIAGNOSIS — E878 Other disorders of electrolyte and fluid balance, not elsewhere classified: Secondary | ICD-10-CM | POA: Diagnosis not present

## 2015-04-19 LAB — BASIC METABOLIC PANEL WITH GFR
BUN: 27 mg/dL — AB (ref 6–23)
CALCIUM: 9.3 mg/dL (ref 8.4–10.5)
CO2: 25 meq/L (ref 19–32)
CREATININE: 1.31 mg/dL — AB (ref 0.50–1.10)
Chloride: 107 mEq/L (ref 96–112)
GFR, Est African American: 42 mL/min — ABNORMAL LOW
GFR, Est Non African American: 36 mL/min — ABNORMAL LOW
Glucose, Bld: 107 mg/dL — ABNORMAL HIGH (ref 70–99)
Potassium: 4.3 mEq/L (ref 3.5–5.3)
Sodium: 142 mEq/L (ref 135–145)

## 2015-04-19 LAB — TSH: TSH: 1.295 u[IU]/mL (ref 0.350–4.500)

## 2015-04-19 NOTE — Telephone Encounter (Signed)
RELEASE TSH ,BMP , INR   LABS FROM [facility] 04/03/15

## 2015-04-19 NOTE — Telephone Encounter (Signed)
She needs lab orders

## 2015-04-20 LAB — PROTIME-INR
INR: 1.26 (ref <1.50)
Prothrombin Time: 15.8 seconds — ABNORMAL HIGH (ref 11.6–15.2)

## 2015-04-23 ENCOUNTER — Telehealth: Payer: Self-pay | Admitting: Cardiology

## 2015-04-23 NOTE — Telephone Encounter (Signed)
Pt says she was returning somebody call from here,she does not know who it was.

## 2015-04-23 NOTE — Telephone Encounter (Signed)
Labwork, scheduled cardioversion discussed - pt questions addressed. She voiced understanding.

## 2015-04-24 ENCOUNTER — Encounter (HOSPITAL_COMMUNITY): Payer: Self-pay

## 2015-04-24 ENCOUNTER — Ambulatory Visit (HOSPITAL_COMMUNITY): Payer: Medicare Other | Admitting: Anesthesiology

## 2015-04-24 ENCOUNTER — Encounter (HOSPITAL_COMMUNITY)
Admission: RE | Disposition: A | Discharge status: Home / Self Care | Payer: Self-pay | Source: Ambulatory Visit | Attending: Cardiology

## 2015-04-24 ENCOUNTER — Ambulatory Visit (HOSPITAL_COMMUNITY)
Admission: RE | Admit: 2015-04-24 | Discharge: 2015-04-24 | Disposition: A | Discharge status: Home / Self Care | Payer: Medicare Other | Source: Ambulatory Visit | Attending: Cardiology | Admitting: Cardiology

## 2015-04-24 DIAGNOSIS — I10 Essential (primary) hypertension: Secondary | ICD-10-CM | POA: Diagnosis not present

## 2015-04-24 DIAGNOSIS — I481 Persistent atrial fibrillation: Secondary | ICD-10-CM | POA: Diagnosis not present

## 2015-04-24 DIAGNOSIS — I4819 Other persistent atrial fibrillation: Secondary | ICD-10-CM | POA: Insufficient documentation

## 2015-04-24 DIAGNOSIS — R9431 Abnormal electrocardiogram [ECG] [EKG]: Secondary | ICD-10-CM | POA: Diagnosis not present

## 2015-04-24 DIAGNOSIS — I5023 Acute on chronic systolic (congestive) heart failure: Secondary | ICD-10-CM | POA: Diagnosis not present

## 2015-04-24 DIAGNOSIS — K219 Gastro-esophageal reflux disease without esophagitis: Secondary | ICD-10-CM | POA: Insufficient documentation

## 2015-04-24 DIAGNOSIS — M1990 Unspecified osteoarthritis, unspecified site: Secondary | ICD-10-CM | POA: Diagnosis not present

## 2015-04-24 DIAGNOSIS — I4891 Unspecified atrial fibrillation: Secondary | ICD-10-CM | POA: Diagnosis not present

## 2015-04-24 DIAGNOSIS — Z9861 Coronary angioplasty status: Secondary | ICD-10-CM | POA: Insufficient documentation

## 2015-04-24 DIAGNOSIS — Z79899 Other long term (current) drug therapy: Secondary | ICD-10-CM | POA: Insufficient documentation

## 2015-04-24 DIAGNOSIS — I447 Left bundle-branch block, unspecified: Secondary | ICD-10-CM | POA: Insufficient documentation

## 2015-04-24 DIAGNOSIS — I4821 Permanent atrial fibrillation: Secondary | ICD-10-CM | POA: Diagnosis present

## 2015-04-24 DIAGNOSIS — E785 Hyperlipidemia, unspecified: Secondary | ICD-10-CM | POA: Insufficient documentation

## 2015-04-24 DIAGNOSIS — N3010 Interstitial cystitis (chronic) without hematuria: Secondary | ICD-10-CM | POA: Diagnosis not present

## 2015-04-24 DIAGNOSIS — E039 Hypothyroidism, unspecified: Secondary | ICD-10-CM | POA: Insufficient documentation

## 2015-04-24 DIAGNOSIS — I252 Old myocardial infarction: Secondary | ICD-10-CM | POA: Diagnosis not present

## 2015-04-24 DIAGNOSIS — I2510 Atherosclerotic heart disease of native coronary artery without angina pectoris: Secondary | ICD-10-CM | POA: Diagnosis not present

## 2015-04-24 DIAGNOSIS — N289 Disorder of kidney and ureter, unspecified: Secondary | ICD-10-CM | POA: Insufficient documentation

## 2015-04-24 DIAGNOSIS — Z7901 Long term (current) use of anticoagulants: Secondary | ICD-10-CM | POA: Insufficient documentation

## 2015-04-24 DIAGNOSIS — Z87891 Personal history of nicotine dependence: Secondary | ICD-10-CM | POA: Insufficient documentation

## 2015-04-24 HISTORY — PX: CARDIOVERSION: SHX1299

## 2015-04-24 SURGERY — CARDIOVERSION
Anesthesia: General

## 2015-04-24 MED ORDER — SODIUM CHLORIDE 0.9 % IV SOLN
INTRAVENOUS | Status: DC | PRN
  Administered 2015-04-24: 13:00:00 via INTRAVENOUS

## 2015-04-24 MED ORDER — LANSOPRAZOLE 30 MG PO CPDR
30.0000 mg | DELAYED_RELEASE_CAPSULE | Freq: Two times a day (BID) | ORAL | Status: DC
Start: 2015-04-24 — End: 2016-08-19

## 2015-04-24 MED ORDER — LIDOCAINE HCL (CARDIAC) 20 MG/ML IV SOLN
INTRAVENOUS | Status: DC | PRN
  Administered 2015-04-24: 5 mL via INTRAVENOUS

## 2015-04-24 MED ORDER — PROPOFOL 10 MG/ML IV BOLUS
INTRAVENOUS | Status: DC | PRN
  Administered 2015-04-24: 50 mg via INTRAVENOUS

## 2015-04-24 NOTE — H&P (Addendum)
Patient ID: Victoria Gomez MRN: [id number], DOB/AGE: 13-Nov-1924   Admit date: 04/24/2015   Primary Physician: Tana Conch, MD Primary Cardiologist: Dr Antoine Poche  Pt. Profile:  A-fib  Problem List  Past Medical History  Diagnosis Date  . HYPERLIPIDEMIA 03/07/2008  . HYPERTENSION 08/03/2007  . CORONARY ARTERY DISEASE     a. s/p PCI to RCA '01. b. repeat PCI to RCA '03 2/2 ISR. c. cath 2012: RCA dz, rx medically.  Occluded RCA.  Nonobstructive disease elsewhere  . Acute on chronic systolic heart failure     EF 40%  . HYPOTHYROIDISM   . Esophageal reflux   . DIVERTICULOSIS, COLON   . BREAST MASS, BENIGN   . BARRETT'S ESOPHAGUS, HX OF   . Interstitial cystitis   . Arthritis   . Internal hemorrhoids   . Epistaxis   . Right hand fracture   . Bladder disorder   . Bradycardia 08/2012    a. Eval for symptomatic bradycardia 08/2012 - beta blocker discontinued.  . Orthostatic hypotension 08/2012    a. 08/2012 w/ syncope - meds adjusted.   . Atrial fibrillation     a. Dx 09/2013.  [name] LBBB (left bundle branch block)   . History of breast lump/mass excision 05/23/2009    Qualifier: History of  By: Lovell Sheehan MD, Balinda Quails     Past Surgical History  Procedure Laterality Date  . Breast surgery      benign mass  . Cholecystectomy    . Cesarean section    . Appendectomy    . Cataract extraction, bilateral    . Tonsillectomy    . Abdominal hysterectomy    . Ankle reconstruction      Left  . Wrist reconstruction      Right  . Cardioversion N/A 10/20/2013     Allergies  Allergies  Allergen Reactions  . Codeine Hives and Nausea And Vomiting  . Sulfonamide Derivatives Swelling    Turns red    HPI  The patient was referred by Dr Antoine Poche and presented for DCCV for chronic persistent atrial fibrillation. She is free of chest pain, SOB. She has been complaint with her medications and took Eliquis regularly including this morning.  Home Medications  Prior to Admission  medications   Medication Sig Start Date End Date Taking? Authorizing Provider  acetaminophen (TYLENOL) 500 MG tablet Take 500 mg by mouth every 6 (six) hours as needed for mild pain.   Yes Historical Provider, MD  apixaban (ELIQUIS) 2.5 MG TABS tablet Take 1 tablet (2.5 mg total) by mouth 2 (two) times daily. 09/08/14  Yes Rollene Rotunda, MD  furosemide (LASIX) 40 MG tablet Take 1.5 tablets (60 mg total) by mouth daily. 01/22/15  Yes Shelva Majestic, MD  isosorbide mononitrate (IMDUR) 60 MG 24 hr tablet Take 1.5 tablets (90 mg total) by mouth daily. 10/27/14  Yes Shelva Majestic, MD  lansoprazole (PREVACID) 30 MG capsule Take 1 capsule (30 mg total) by mouth 2 (two) times daily. 04/21/14 04/21/15 Yes Stacie Glaze, MD  levothyroxine (SYNTHROID, LEVOTHROID) 50 MCG tablet Take 1 tablet (50 mcg total) by mouth daily before breakfast. 03/21/15  Yes Shelva Majestic, MD  metoprolol succinate (TOPROL XL) 25 MG 24 hr tablet Take 1 tablet (25 mg total) by mouth daily. 03/09/15  Yes Rollene Rotunda, MD  pentosan polysulfate (ELMIRON) 100 MG capsule Take 100 mg by mouth every morning.    Yes Historical Provider, MD  potassium chloride (KLOR-CON  10) 10 MEQ tablet Take 1 tablet (10 mEq total) by mouth daily. 10/27/14  Yes Shelva Majestic, MD  pravastatin (PRAVACHOL) 40 MG tablet Take 1 tablet (40 mg total) by mouth daily. Patient taking differently: Take 40 mg by mouth daily at 6 PM.  10/27/14  Yes Shelva Majestic, MD  valsartan (DIOVAN) 320 MG tablet Take 1 tablet (320 mg total) by mouth daily. 09/08/14  Yes Rollene Rotunda, MD  nitroGLYCERIN (NITROSTAT) 0.4 MG SL tablet Place 1 tablet (0.4 mg total) under the tongue every 5 (five) minutes as needed for chest pain. If pain after 3 pills- call 911 04/21/14   Stacie Glaze, MD    Family History  Family History  Problem Relation Age of Onset  . Colon cancer Mother   . Coronary artery disease Mother   . Heart disease Mother   . Coronary artery disease Father    . Heart disease Father   . Colon polyps Sister   . Coronary artery disease Sister   . Coronary artery disease Brother   . Diabetes type II Son     Social History  History   Social History  . Marital Status: Widowed    Spouse Name: N/A  . Number of Children: N/A  . Years of Education: N/A   Occupational History  . retired    Social History Main Topics  . Smoking status: Former Smoker -- 1.00 packs/day for 15 years    Types: Cigarettes    Quit date: 11/10/1969  . Smokeless tobacco: Never Used  . Alcohol Use: No  . Drug Use: No  . Sexual Activity: No   Other Topics Concern  . Not on file   Social History Narrative   Widowed 05/2013. 2nd marriage. First husband died of cancer. 3 sons-[name]- 2 sons, 2 twin girls, [name]-1 son, no grandsons. [name]-1 son, 1 daughter, no grandkids.       LIves with youngest son (has cancer). Son drives her but has license. Independent IADLs.       Retired from [employer] after 33 years.       Hobbies: crocheting, used to bowl with husband, reading      Living will: currently wants resuscitation.      Review of Systems General:  No chills, fever, night sweats or weight changes.  Cardiovascular:  No chest pain, dyspnea on exertion, edema, orthopnea, palpitations, paroxysmal nocturnal dyspnea. Dermatological: No rash, lesions/masses Respiratory: No cough, dyspnea Urologic: No hematuria, dysuria Abdominal:   No nausea, vomiting, diarrhea, bright red blood per rectum, melena, or hematemesis Neurologic:  No visual changes, wkns, changes in mental status. All other systems reviewed and are otherwise negative except as noted above.  Physical Exam  Blood pressure 131/78, temperature 99.3 F (37.4 C), temperature source Oral, resp. rate 20, height  (1.6 m), weight 159 lb (72.122 kg), SpO2 97 %.  General: Pleasant, NAD Psych: Normal affect. Neuro: Alert and oriented X 3. Moves all extremities spontaneously. HEENT: Normal  Neck:  Supple without bruits or JVD. Lungs:  Resp regular and unlabored, CTA. Heart: iRRR no s3, s4, or murmurs. Abdomen: Soft, non-tender, non-distended, BS + x 4.  Extremities: No clubbing, cyanosis or edema. DP/PT/Radials 2+ and equal bilaterally.  Labs  No results for input(s): CKTOTAL, CKMB, TROPONINI in the last 72 hours. Lab Results  Component Value Date   WBC 5.9 10/27/2014   HGB 12.6 10/27/2014   HCT 38.5 10/27/2014   MCV 85.7 10/27/2014   PLT 214.0  10/27/2014    Recent Labs Lab 04/19/15 1208  NA 142  K 4.3  CL 107  CO2 25  BUN 27*  CREATININE 1.31*  CALCIUM 9.3  GLUCOSE 107*   Lab Results  Component Value Date   CHOL 129 09/15/2014   HDL 43.50 09/15/2014   LDLCALC 69 09/15/2014   TRIG 82.0 09/15/2014   Lab Results  Component Value Date   DDIMER 0.79* 08/31/2012   Invalid input(s): POCBNP     ASSESSMENT AND PLAN  Atrial fibrillation - we will proceed with DCCV.   DVT PPX - on Eliquis   Signed, Lars Masson, MD, [facility] 04/24/2015, 1:44 PM

## 2015-04-24 NOTE — Anesthesia Postprocedure Evaluation (Signed)
  Anesthesia Post-op Note  Patient: Victoria Gomez  Procedure(s) Performed: Procedure(s): CARDIOVERSION (N/A)  Patient Location: PACU and Endoscopy Unit  Anesthesia Type:MAC  Level of Consciousness: sedated and patient cooperative  Airway and Oxygen Therapy: Patient Spontanous Breathing and Patient connected to nasal cannula oxygen  Post-op Pain: none  Post-op Assessment: Post-op Vital signs reviewed, Patient's Cardiovascular Status Stable, Respiratory Function Stable, Patent Airway and No signs of Nausea or vomiting              Post-op Vital Signs: Reviewed and stable  Last Vitals:  Filed Vitals:   04/24/15 1243  BP: 131/78  Temp: 37.4 C  Resp: 20    Complications: No apparent anesthesia complications

## 2015-04-24 NOTE — Anesthesia Preprocedure Evaluation (Signed)
Anesthesia Evaluation    Reviewed: Allergy & Precautions, H&P , NPO status , Patient's Chart, lab work & pertinent test results, reviewed documented beta blocker date and time   History of Anesthesia Complications Negative for: history of anesthetic complications  Airway Mallampati: II       Dental  (+) Upper Dentures   Pulmonary former smoker,  breath sounds clear to auscultation        Cardiovascular hypertension, Pt. on medications + CAD, + Past MI and +CHF + dysrhythmias Atrial Fibrillation Rhythm:Irregular     Neuro/Psych negative neurological ROS     GI/Hepatic GERD-  Medicated,  Endo/Other  Hypothyroidism   Renal/GU Renal InsufficiencyRenal disease     Musculoskeletal   Abdominal   Peds  Hematology   Anesthesia Other Findings   Reproductive/Obstetrics                             Anesthesia Physical  Anesthesia Plan  ASA: III  Anesthesia Plan: General   Post-op Pain Management:    Induction: Intravenous  Airway Management Planned: Mask  Additional Equipment:   Intra-op Plan:   Post-operative Plan:   Informed Consent: I have reviewed the patients History and Physical, chart, labs and discussed the procedure including the risks, benefits and alternatives for the proposed anesthesia with the patient or authorized representative who has indicated his/her understanding and acceptance.   Dental advisory given  Plan Discussed with: CRNA, Anesthesiologist and Surgeon  Anesthesia Plan Comments:         Anesthesia Quick Evaluation

## 2015-04-24 NOTE — Discharge Instructions (Signed)

## 2015-04-24 NOTE — CV Procedure (Signed)
    Cardioversion Note  RAYLEY CASO [id number] 04/14/1925  Procedure: DC Cardioversion Indications: atrial fibrillation  Procedure Details Consent: Obtained Time Out: Verified patient identification, verified procedure, site/side was marked, verified correct patient position, special equipment/implants available, Radiology Safety Procedures followed,  medications/allergies/relevent history reviewed, required imaging and test results available.  Performed  The patient has been on adequate anticoagulation.  The patient received 100 mg of iv Propofol and 50 mg of iv Lidocaine administered by an anesthesiologist for sedation.  Synchronous cardioversion was performed at 120 joules.  The cardioversion was successful   Complications: No apparent complications Patient did tolerate procedure well.   Lars Masson, MD, [facility] 04/24/2015, 1:55 PM

## 2015-04-24 NOTE — Transfer of Care (Signed)
Immediate Anesthesia Transfer of Care Note  Patient: Victoria Gomez  Procedure(s) Performed: Procedure(s): CARDIOVERSION (N/A)  Patient Location: PACU and Endoscopy Unit  Anesthesia Type:MAC  Level of Consciousness: sedated and patient cooperative  Airway & Oxygen Therapy: Patient Spontanous Breathing and Patient connected to nasal cannula oxygen  Post-op Assessment: Report given to RN and Post -op Vital signs reviewed and stable  Post vital signs: Reviewed and stable  Last Vitals:  Filed Vitals:   04/24/15 1243  BP: 131/78  Temp: 37.4 C  Resp: 20    Complications: No apparent anesthesia complications

## 2015-04-25 ENCOUNTER — Encounter (HOSPITAL_COMMUNITY): Payer: Self-pay | Admitting: Cardiology

## 2015-05-03 ENCOUNTER — Encounter: Payer: Self-pay | Admitting: Physician Assistant

## 2015-05-03 DIAGNOSIS — I5042 Chronic combined systolic (congestive) and diastolic (congestive) heart failure: Secondary | ICD-10-CM | POA: Insufficient documentation

## 2015-05-03 DIAGNOSIS — I480 Paroxysmal atrial fibrillation: Secondary | ICD-10-CM | POA: Insufficient documentation

## 2015-05-03 NOTE — Progress Notes (Addendum)
Cardiology Office Note Date:  05/04/2015  Patient ID:  Victoria, Gomez May 05, 1925, MRN [id number] PCP:  Tana Conch, MD  Cardiologist:  Hochrein  Chief Complaint: follow-up of atrial fib  History of Present Illness: Victoria Gomez is a 81 y.o. female with history of CAD s/p PCI as listed below, PAF, LBBB, HTN, HLD, chronic diastolic CHF with previous LV dysfunction, symptomatic bradycardia 08/2012 (BB discontinued), CKD stage III who presents for followup post-DCCV. Previous echo 09/2013 - ef 40-45%, grade 3 diastolic dysfunction, mitral valve abnormality, mildly dilated LA - Dr. Antoine Poche felt the MV abnormality likely represented her calcified leaflet and did not recommend TEE at that time. In 2014 she was placed on lower dose of Eliquis due to CKD, age, and being on CCB therapy. (Last Cr in 04/19/15 was 1.3.) She saw Dr. Antoine Poche in 03/2015 at which time she had chest pain, fatigue, and elevated HR. Metoprolol was added. NOAC was continued at present dose. Nuc 03/2015 showed tiny fixed apicoseptal mild perfusion abnormality likely LBBB-related artifact, low risk study with normal perfusion and normal LV regional function. Dr. Antoine Poche recommended scheduled DCCV which was performed on 04/24/15 with successful restoration of NSR.   She comes in today reporting that she is feeling better s/p cardioversion. She does not report any further chest pain or palpitations. She has chronic mild DOE but this is unchanged and does not seem to limit her lifestyle. She still cooks, cleans, and mops/vacuums. She does report a sense of baseline fatigue though and has noticed her HR tends to run in the 40s mostly, occasionally getting up to the 60s. No syncope or presyncope. She does report a nosebleed that lasted several weeks about a month ago but has not had further bleeding.   Past Medical History  Diagnosis Date  . HYPERLIPIDEMIA 03/07/2008  . HYPERTENSION 08/03/2007  . CAD (coronary artery disease)     a.  s/p PCI to RCA '01. b. repeat PCI to RCA '03 2/2 ISR. c. cath 2012: RCA dz, rx medically.  Occluded RCA.  Nonobstructive disease elsewhere  . Chronic combined systolic and diastolic CHF (congestive heart failure)     a. EF 40-45% in 2014. b. EF reported to be normal in 03/2015 nuc.  [name] HYPOTHYROIDISM   . Esophageal reflux   . DIVERTICULOSIS, COLON   . BREAST MASS, BENIGN   . BARRETT'S ESOPHAGUS, HX OF   . Interstitial cystitis   . Arthritis   . Internal hemorrhoids   . Epistaxis   . Right hand fracture   . Bladder disorder   . Bradycardia 08/2012    a. Eval for symptomatic bradycardia 08/2012 - beta blocker discontinued.  . Orthostatic hypotension 08/2012    a. 08/2012 w/ syncope - meds adjusted.   [name] PAF (paroxysmal atrial fibrillation)     a. Dx 09/2013.  [name] LBBB (left bundle branch block)   . History of breast lump/mass excision 05/23/2009    Qualifier: History of  By: Lovell Sheehan MD, Balinda Quails   . CKD (chronic kidney disease), stage III     Past Surgical History  Procedure Laterality Date  . Breast surgery      benign mass  . Cholecystectomy    . Cesarean section    . Appendectomy    . Cataract extraction, bilateral    . Tonsillectomy    . Abdominal hysterectomy    . Ankle reconstruction      Left  . Wrist reconstruction  Right  . Cardioversion N/A 10/20/2013  . Cardioversion N/A 04/24/2015    Procedure: CARDIOVERSION;  Surgeon: Lars Masson, MD;  Location: [facility] ENDOSCOPY;  Service: Cardiovascular;  Laterality: N/A;    Current Outpatient Prescriptions  Medication Sig Dispense Refill  . acetaminophen (TYLENOL) 500 MG tablet Take 500 mg by mouth every 6 (six) hours as needed for mild pain.    [name] apixaban (ELIQUIS) 2.5 MG TABS tablet Take 1 tablet (2.5 mg total) by mouth 2 (two) times daily. 180 tablet 3  . furosemide (LASIX) 40 MG tablet Take 1.5 tablets (60 mg total) by mouth daily. 60 tablet 1  . isosorbide mononitrate (IMDUR) 60 MG 24 hr tablet Take 1.5 tablets (90 mg  total) by mouth daily. 135 tablet 3  . lansoprazole (PREVACID) 30 MG capsule Take 1 capsule (30 mg total) by mouth 2 (two) times daily. 180 capsule 3  . levothyroxine (SYNTHROID, LEVOTHROID) 50 MCG tablet Take 1 tablet (50 mcg total) by mouth daily before breakfast. 90 tablet 3  . metoprolol succinate (TOPROL XL) 25 MG 24 hr tablet Take 1 tablet (25 mg total) by mouth daily. 90 tablet 3  . nitroGLYCERIN (NITROSTAT) 0.4 MG SL tablet Place 1 tablet (0.4 mg total) under the tongue every 5 (five) minutes as needed for chest pain. If pain after 3 pills- call 911 20 tablet 2  . pentosan polysulfate (ELMIRON) 100 MG capsule Take 100 mg by mouth every morning.     . potassium chloride (KLOR-CON 10) 10 MEQ tablet Take 1 tablet (10 mEq total) by mouth daily. 90 tablet 3  . pravastatin (PRAVACHOL) 40 MG tablet Take 1 tablet (40 mg total) by mouth daily. (Patient taking differently: Take 40 mg by mouth daily at 6 PM. ) 90 tablet 3  . valsartan (DIOVAN) 320 MG tablet Take 1 tablet (320 mg total) by mouth daily. 90 tablet 3  . [DISCONTINUED] potassium chloride (K-DUR,KLOR-CON) 10 MEQ tablet Take 1 tablet (10 mEq total) by mouth daily. 90 tablet 3   No current facility-administered medications for this visit.    Allergies:   Codeine and Sulfonamide derivatives   Social History:  The patient  reports that she quit smoking about 45 years ago. Her smoking use included Cigarettes. She has a 15 pack-year smoking history. She has never used smokeless tobacco. She reports that she does not drink alcohol or use illicit drugs.   Family History:  The patient's family history includes Colon cancer in her mother; Colon polyps in her sister; Coronary artery disease in her brother, father, mother, and sister; Diabetes type II in her son; Heart disease in her father and mother.  ROS:  Please see the history of present illness.  All other systems are reviewed and otherwise negative.   PHYSICAL EXAM:  VS:  BP 120/62 mmHg   Pulse 46  Ht 5\' 3"  (1.6 m)  Wt 162 lb (73.483 kg)  BMI 28.70 kg/m2 BMI: Body mass index is 28.7 kg/(m^2). Well nourished, well developed WF in no acute distress, lively, friendly, normal gait HEENT: normocephalic, atraumatic Neck: no JVD, carotid bruits or masses Cardiac:  normal S1, S2; reg rhythm with mild bradycardia; very soft SEM. No rubs, or gallops Lungs:  clear to auscultation bilaterally, no wheezing, rhonchi or rales Abd: soft, nontender, no hepatomegaly, + BS MS: no deformity or atrophy Ext: no edema Skin: warm and dry, no rash Neuro:  moves all extremities spontaneously, no focal abnormalities noted, follows commands Psych: euthymic mood, full affect  EKG:  Done today shows sinus bradycardia 46bpm with LBBB, sinus arrhythmia, left axis deviation  Recent Labs: 05/10/2014: ALT 10 10/27/2014: Hemoglobin 12.6; Platelets 214.0 04/19/2015: BUN 27*; Creat 1.31*; Potassium 4.3; Sodium 142; TSH 1.295  09/15/2014: Cholesterol 129; HDL 43.50; LDL Cholesterol 69; Total CHOL/HDL Ratio 3; Triglycerides 82.0; VLDL 16.4   Estimated Creatinine Clearance: 28.5 mL/min (by C-G formula based on Cr of 1.31).   Wt Readings from Last 3 Encounters:  05/04/15 162 lb (73.483 kg)  04/24/15 159 lb (72.122 kg)  03/23/15 159 lb (72.122 kg)     Other studies reviewed: Additional studies/records reviewed today include: summarized above  ASSESSMENT AND PLAN:  1. Paroxysmal atrial fibrillation s/p recent DCCV - maintaining NSR per her report. See below re: bradycardia. She is maintained on a lower dose of Eliquis per her primary cardiologist. Addendum: I spoke with Dr. Antoine Poche today to clarify the Eliquis dose. We calculated her CrCl out to 2mL/min which by comparison with Xarelto would've also qualified her for a lower dose. He would like to continue Eliquis at previous current dose of 2.5mg  BID. 2. H/o symptomatic bradycardia - I wonder if some of her fatigue is from her bradycardia while on  metoprolol. She was taken off this in the past due to symptomatic bradycardia. Will cut down to 12.5mg  daily to see if this helps. Ultimately she may need to see EP for consideration of pacemaker for tachybrady-syndrome and need for AVN blocking agents. 3. Epistaxis - given reports of significant epistaxis last month prior to DCCV will check Hgb since it hasn't been looked at recently. 4. CAD s/p PCI - no recurrent angina.  5. Essential HTN  - BP stable. 6. H/o chronic combined CHF with improved EF by recent nuc - appears euvolemic.  Disposition: F/u with Dr. Antoine Poche as previously planned 05/2015 to make sure she is doing OK on the lower dose of metoprolol.  Current medicines are reviewed at length with the patient today.  The patient did not have any concerns regarding medicines.  Thomasene Mohair PA-C 05/04/2015 10:02 AM     [facility] [address] [place] [address] [phone] (office)  [phone] (fax)

## 2015-05-04 ENCOUNTER — Encounter: Payer: Self-pay | Admitting: Physician Assistant

## 2015-05-04 ENCOUNTER — Ambulatory Visit (INDEPENDENT_AMBULATORY_CARE_PROVIDER_SITE_OTHER): Payer: Medicare Other | Admitting: Physician Assistant

## 2015-05-04 VITALS — BP 120/62 | HR 46 | Ht 63.0 in | Wt 162.0 lb

## 2015-05-04 DIAGNOSIS — Z8679 Personal history of other diseases of the circulatory system: Secondary | ICD-10-CM

## 2015-05-04 DIAGNOSIS — I10 Essential (primary) hypertension: Secondary | ICD-10-CM | POA: Diagnosis not present

## 2015-05-04 DIAGNOSIS — I480 Paroxysmal atrial fibrillation: Secondary | ICD-10-CM | POA: Diagnosis not present

## 2015-05-04 DIAGNOSIS — Z9861 Coronary angioplasty status: Secondary | ICD-10-CM

## 2015-05-04 DIAGNOSIS — R040 Epistaxis: Secondary | ICD-10-CM

## 2015-05-04 DIAGNOSIS — I2510 Atherosclerotic heart disease of native coronary artery without angina pectoris: Secondary | ICD-10-CM

## 2015-05-04 DIAGNOSIS — Z87898 Personal history of other specified conditions: Secondary | ICD-10-CM

## 2015-05-04 DIAGNOSIS — I5042 Chronic combined systolic (congestive) and diastolic (congestive) heart failure: Secondary | ICD-10-CM

## 2015-05-04 LAB — CBC
HCT: 37.2 % (ref 36.0–46.0)
HEMOGLOBIN: 12.3 g/dL (ref 12.0–15.0)
MCHC: 33.1 g/dL (ref 30.0–36.0)
MCV: 86.8 fl (ref 78.0–100.0)
Platelets: 165 10*3/uL (ref 150.0–400.0)
RBC: 4.29 Mil/uL (ref 3.87–5.11)
RDW: 14.2 % (ref 11.5–15.5)
WBC: 6.3 10*3/uL (ref 4.0–10.5)

## 2015-05-04 MED ORDER — METOPROLOL SUCCINATE ER 25 MG PO TB24: 12.5000 mg | ORAL_TABLET | Freq: Every day | ORAL | Status: DC

## 2015-05-04 NOTE — Patient Instructions (Addendum)
Medication Instructions:   START TAKING 12.5 MG HALF OF (25 MG) TOPROL ONCE A DAY    Labwork:  CBC   Testing/Procedures:   Follow-Up:  KEEP FOLLOW UP APPOINTMENT WITH DR Virgina Jock  Any Other Special Instructions Will Be Listed Below (If Applicable).

## 2015-05-17 ENCOUNTER — Encounter: Payer: Self-pay | Admitting: Cardiology

## 2015-05-17 ENCOUNTER — Ambulatory Visit (INDEPENDENT_AMBULATORY_CARE_PROVIDER_SITE_OTHER): Payer: Medicare Other | Admitting: Cardiology

## 2015-05-17 VITALS — BP 104/58 | HR 63 | Ht 63.0 in | Wt 163.0 lb

## 2015-05-17 DIAGNOSIS — I2510 Atherosclerotic heart disease of native coronary artery without angina pectoris: Secondary | ICD-10-CM | POA: Diagnosis not present

## 2015-05-17 DIAGNOSIS — I10 Essential (primary) hypertension: Secondary | ICD-10-CM

## 2015-05-17 DIAGNOSIS — Z9861 Coronary angioplasty status: Secondary | ICD-10-CM | POA: Diagnosis not present

## 2015-05-17 NOTE — Progress Notes (Signed)
HPI The patient presents for followup up of atrial fibrillation and CAD.  She was recently found to be back in atrial fibrillation.  She is status post recent DCCV.  She has been having some low heart rates. She had her dose of beta blocker reduced. However, she otherwise feels well. She is doing some mild household chores. With this she denies any cardiovascular symptoms.  The patient denies any new symptoms such as chest discomfort, neck or arm discomfort. There has been no new shortness of breath, PND or orthopnea. There have been no reported palpitations, presyncope or syncope.  Allergies  Allergen Reactions  . Codeine Hives and Nausea And Vomiting  . Sulfonamide Derivatives Swelling    Turns red    Current Outpatient Prescriptions  Medication Sig Dispense Refill  . acetaminophen (TYLENOL) 500 MG tablet Take 500 mg by mouth every 6 (six) hours as needed for mild pain.    Victoria Gomez apixaban (ELIQUIS) 2.5 MG TABS tablet Take 1 tablet (2.5 mg total) by mouth 2 (two) times daily. 180 tablet 3  . furosemide (LASIX) 40 MG tablet Take 1.5 tablets (60 mg total) by mouth daily. 60 tablet 1  . isosorbide mononitrate (IMDUR) 60 MG 24 hr tablet Take 1.5 tablets (90 mg total) by mouth daily. 135 tablet 3  . lansoprazole (PREVACID) 30 MG capsule Take 1 capsule (30 mg total) by mouth 2 (two) times daily. 180 capsule 3  . levothyroxine (SYNTHROID, LEVOTHROID) 50 MCG tablet Take 1 tablet (50 mcg total) by mouth daily before breakfast. 90 tablet 3  . metoprolol succinate (TOPROL XL) 25 MG 24 hr tablet Take 0.5 tablets (12.5 mg total) by mouth daily. 90 tablet 3  . nitroGLYCERIN (NITROSTAT) 0.4 MG SL tablet Place 1 tablet (0.4 mg total) under the tongue every 5 (five) minutes as needed for chest pain. If pain after 3 pills- call 911 20 tablet 2  . pentosan polysulfate (ELMIRON) 100 MG capsule Take 100 mg by mouth every morning.     . potassium chloride (KLOR-CON 10) 10 MEQ tablet Take 1 tablet (10 mEq total) by  mouth daily. 90 tablet 3  . pravastatin (PRAVACHOL) 40 MG tablet Take 1 tablet (40 mg total) by mouth daily. (Patient taking differently: Take 40 mg by mouth daily at 6 PM. ) 90 tablet 3  . valsartan (DIOVAN) 320 MG tablet Take 1 tablet (320 mg total) by mouth daily. 90 tablet 3  . [DISCONTINUED] potassium chloride (K-DUR,KLOR-CON) 10 MEQ tablet Take 1 tablet (10 mEq total) by mouth daily. 90 tablet 3   No current facility-administered medications for this visit.    Past Medical History  Diagnosis Date  . HYPERLIPIDEMIA 03/07/2008  . HYPERTENSION 08/03/2007  . CAD (coronary artery disease)     a. s/p PCI to RCA '01. b. repeat PCI to RCA '03 2/2 ISR. c. cath 2012: RCA dz, rx medically.  Occluded RCA.  Nonobstructive disease elsewhere  . Chronic combined systolic and diastolic CHF (congestive heart failure)     a. EF 40-45% in 2014. b. EF reported to be normal in 03/2015 nuc.  Victoria Gomez HYPOTHYROIDISM   . Esophageal reflux   . DIVERTICULOSIS, COLON   . BREAST MASS, BENIGN   . BARRETT'S ESOPHAGUS, HX OF   . Interstitial cystitis   . Arthritis   . Internal hemorrhoids   . Epistaxis   . Right hand fracture   . Bladder disorder   . Bradycardia 08/2012    a. Eval for symptomatic bradycardia 08/2012 -  beta blocker discontinued.  . Orthostatic hypotension 08/2012    a. 08/2012 w/ syncope - meds adjusted.   Victoria Gomez. PAF (paroxysmal atrial fibrillation)     a. Dx 09/2013.  Victoria Gomez. LBBB (left bundle branch block)   . History of breast lump/mass excision 05/23/2009    Qualifier: History of  By: Lovell SheehanJenkins MD, Balinda QuailsJohn E   . CKD (chronic kidney disease), stage III     Past Surgical History  Procedure Laterality Date  . Breast surgery      benign mass  . Cholecystectomy    . Cesarean section    . Appendectomy    . Cataract extraction, bilateral    . Tonsillectomy    . Abdominal hysterectomy    . Ankle reconstruction      Left  . Wrist reconstruction      Right  . Cardioversion N/A 10/20/2013  . Cardioversion  N/A 04/24/2015    Procedure: CARDIOVERSION;  Surgeon: Lars MassonKatarina H Nelson, MD;  [facility] END[facility];  Service: Cardiovascular;  Laterality: N/A;    ROS:  As stated in the HPI and negative for all other systems.  PHYSICAL EXAM BP 104/58 mmHg  Pulse 63  Ht 5\' 3"  (1.6 m)  Wt 163 lb (73.936 kg)  BMI 28.88 kg/m2 GENERAL:  Well appearing NECK:  No jugular venous distention, waveform within normal limits, carotid upstroke brisk and symmetric, no bruits, no thyromegaly LUNGS:  Decreased breath sounds in the left lung base with few basilar crackles. BACK:  No CVA tenderness CHEST: Unremarkable HEART:  PMI not displaced or sustained,S1 and S2 within normal limits, no S3, for her, no clicks, no rubs, no murmurs, irregular ABD:  Flat, positive bowel sounds normal in frequency in pitch, no bruits, no rebound, no guarding, no midline pulsatile mass, no hepatomegaly, no splenomegaly EXT:  2 plus pulses throughout, no edema, no cyanosis no clubbing  ASSESSMENT AND PLAN  ATRIAL FIB -  I will continue her NOAC.  I will stop her beta blocker since her heart rate has been low.  No change in therapy is indicated.   CORONARY ARTERY DISEASE -   She had a low risk nuclear study.  No change in therapy is indicated.   HYPERTENSION -  Her blood pressure is running on the low side and I will stop the beta blocker.   Diastolic HF (heart failure) - She seems to be euvolemic. No change in therapy is indicated.  No further cardiovascular testing is indicated.   Dyslipidemia -   her LDL was 69 last year with an HDL of 43. She will continue the meds as listed.

## 2015-05-17 NOTE — Patient Instructions (Addendum)
Your physician wants you to follow-up in: 1 Year. You will receive a reminder letter in the mail two months in advance. If you don't receive a letter, please call our office to schedule the follow-up appointment.  Your physician has recommended you make the following change in your medication: STOP Metoprolol  

## 2015-06-07 DIAGNOSIS — M545 Low back pain: Secondary | ICD-10-CM | POA: Diagnosis not present

## 2015-06-07 DIAGNOSIS — M4690 Unspecified inflammatory spondylopathy, site unspecified: Secondary | ICD-10-CM | POA: Diagnosis not present

## 2015-06-11 ENCOUNTER — Telehealth: Payer: Self-pay | Admitting: Cardiology

## 2015-06-11 NOTE — Telephone Encounter (Signed)
New message      Request for surgical clearance:  What type of surgery is being performed? Facet injections When is this surgery scheduled? 07-04-15 Are there any medications that need to be held prior to surgery and how long?  Hold eliquis for 5 days if possible 1. Name of physician performing surgery?  Dr Sheran Luz  2. What is your office phone and fax number? Fax [phone]

## 2015-06-17 NOTE — Telephone Encounter (Signed)
OK to hold Eliquis for 5 days prior to spinal injections.

## 2015-06-18 NOTE — Telephone Encounter (Signed)
Routed encounter via [facility] fax to Dr. Ethelene Hal.

## 2015-06-28 ENCOUNTER — Encounter: Payer: Self-pay | Admitting: Family Medicine

## 2015-06-28 ENCOUNTER — Ambulatory Visit (INDEPENDENT_AMBULATORY_CARE_PROVIDER_SITE_OTHER): Payer: Medicare Other | Admitting: Family Medicine

## 2015-06-28 ENCOUNTER — Telehealth: Payer: Self-pay | Admitting: Family Medicine

## 2015-06-28 VITALS — BP 112/64 | HR 63 | Temp 98.9°F | Wt 164.0 lb

## 2015-06-28 DIAGNOSIS — E119 Type 2 diabetes mellitus without complications: Secondary | ICD-10-CM | POA: Diagnosis not present

## 2015-06-28 DIAGNOSIS — M8580 Other specified disorders of bone density and structure, unspecified site: Secondary | ICD-10-CM

## 2015-06-28 DIAGNOSIS — I10 Essential (primary) hypertension: Secondary | ICD-10-CM | POA: Diagnosis not present

## 2015-06-28 DIAGNOSIS — M810 Age-related osteoporosis without current pathological fracture: Secondary | ICD-10-CM | POA: Insufficient documentation

## 2015-06-28 DIAGNOSIS — N183 Chronic kidney disease, stage 3 unspecified: Secondary | ICD-10-CM

## 2015-06-28 LAB — HEMOGLOBIN A1C: Hgb A1c MFr Bld: 6.2 % (ref 4.6–6.5)

## 2015-06-28 LAB — BASIC METABOLIC PANEL
BUN: 26 mg/dL — AB (ref 6–23)
CHLORIDE: 107 meq/L (ref 96–112)
CO2: 29 mEq/L (ref 19–32)
Calcium: 9.2 mg/dL (ref 8.4–10.5)
Creatinine, Ser: 1.1 mg/dL (ref 0.40–1.20)
GFR: 49.76 mL/min — ABNORMAL LOW (ref >60.00)
GLUCOSE: 103 mg/dL — AB (ref 70–99)
Potassium: 3.5 mEq/L (ref 3.5–5.1)
Sodium: 142 mEq/L (ref 135–145)

## 2015-06-28 MED ORDER — INJECTION DEVICE FOR INSULIN DEVI: Status: DC

## 2015-06-28 MED ORDER — GLUCOSE BLOOD VI STRP: ORAL_STRIP | Status: DC

## 2015-06-28 NOTE — Patient Instructions (Signed)
Medication Instructions:  Same, see list  Other Instructions:  Get your bone density when you get your mammogram- have them send [place] a copy of both  Labwork: Check sugar level as well as kidneys  Testing/Procedures/Immunizations: Flu shot around October (sept- nov)  Follow-Up (all visit scheduling, rescheduling, cancellations including labs should be scheduled at front desk): 4 month check in   Sooner if you need [place]

## 2015-06-28 NOTE — Assessment & Plan Note (Signed)
S: BP controlled. On ace-i . GFR stable in 50s.  A/P: continue current rx, trend GFR

## 2015-06-28 NOTE — Assessment & Plan Note (Signed)
S: well controlled on valsartan and lasix despite stopping lasix BP Readings from Last 3 Encounters:  06/28/15 112/64  05/17/15 104/58  05/04/15 120/62  A/P: continue current rx

## 2015-06-28 NOTE — Telephone Encounter (Signed)
Strips and needles called in.

## 2015-06-28 NOTE — Assessment & Plan Note (Signed)
S: trying to remain active. Last a1c 6.4.  Lab Results  Component Value Date   HGBA1C 6.2 06/28/2015  A/P: improved a1c, continue without medicine

## 2015-06-28 NOTE — Telephone Encounter (Signed)
Pt's meter is one touch verio.  Pt needs test strips and needles [facility]

## 2015-06-28 NOTE — Progress Notes (Signed)
Tana Conch, MD  Subjective:  Victoria Gomez is a 81 y.o. year old very pleasant female patient who presents with:  See problem oriented charting ROS- no chest pain, shortness of breath  Past Medical History- CAD, diastolic CHF, a fib, hypothyroid, HLD, HTN, CKD III  Medications- reviewed and updated Current Outpatient Prescriptions  Medication Sig Dispense Refill  . apixaban (ELIQUIS) 2.5 MG TABS tablet Take 1 tablet (2.5 mg total) by mouth 2 (two) times daily. 180 tablet 3  . furosemide (LASIX) 40 MG tablet Take 1.5 tablets (60 mg total) by mouth daily. 60 tablet 1  . isosorbide mononitrate (IMDUR) 60 MG 24 hr tablet Take 1.5 tablets (90 mg total) by mouth daily. 135 tablet 3  . lansoprazole (PREVACID) 30 MG capsule Take 1 capsule (30 mg total) by mouth 2 (two) times daily. 180 capsule 3  . levothyroxine (SYNTHROID, LEVOTHROID) 50 MCG tablet Take 1 tablet (50 mcg total) by mouth daily before breakfast. 90 tablet 3  . pentosan polysulfate (ELMIRON) 100 MG capsule Take 100 mg by mouth every morning.     . potassium chloride (KLOR-CON 10) 10 MEQ tablet Take 1 tablet (10 mEq total) by mouth daily. 90 tablet 3  . pravastatin (PRAVACHOL) 40 MG tablet Take 1 tablet (40 mg total) by mouth daily. (Patient taking differently: Take 40 mg by mouth daily at 6 PM. ) 90 tablet 3  . valsartan (DIOVAN) 320 MG tablet Take 1 tablet (320 mg total) by mouth daily. 90 tablet 3  . acetaminophen (TYLENOL) 500 MG tablet Take 500 mg by mouth every 6 (six) hours as needed for mild pain.    . nitroGLYCERIN (NITROSTAT) 0.4 MG SL tablet Place 1 tablet (0.4 mg total) under the tongue every 5 (five) minutes as needed for chest pain. If pain after 3 pills- call 911 (Patient not taking: Reported on 06/28/2015) 20 tablet 2  . [DISCONTINUED] potassium chloride (K-DUR,KLOR-CON) 10 MEQ tablet Take 1 tablet (10 mEq total) by mouth daily. 90 tablet 3   Objective: BP 112/64 mmHg  Pulse 63  Temp(Src) 98.9 F (37.2 C)  Wt  164 lb (74.39 kg) Gen: NAD, resting comfortably CV: RRR (does not appear to be in a fib) no murmurs rubs or gallops Lungs: CTAB no crackles, wheeze, rhonchi Abdomen: soft/nontender/nondistended/normal bowel sounds. No rebound or guarding.  Ext: trace edema Skin: warm, dry Neuro: grossly normal, moves all extremities   Assessment/Plan:  Diabetes mellitus II S: trying to remain active. Last a1c 6.4.  Lab Results  Component Value Date   HGBA1C 6.2 06/28/2015  A/P: improved a1c, continue without medicine  CKD (chronic kidney disease), stage III S: BP controlled. On ace-i . GFR stable in 50s.  A/P: continue current rx, trend GFR   Osteopenia S: Patient continues to get mammograms- we discussed how this is unlikely to affect life expectancy. They also offer bone density tests- may be [facility] A/P:. I advised her to get a bone density and follow up. I suspect even if doesn't affect life expectancy, avoiding a fracture could increase quality of life and would consider if progressed to osteoporosis.   Essential hypertension S: well controlled on valsartan and lasix despite stopping lasix BP Readings from Last 3 Encounters:  06/28/15 112/64  05/17/15 104/58  05/04/15 120/62  A/P: continue current rx   4 months.   Results for orders placed or performed in visit on 06/28/15 (from the past 24 hour(s))  Hemoglobin A1c     Status: None  Collection Time: 06/28/15  2:45 PM  Result Value Ref Range   Hgb A1c MFr Bld 6.2 4.6 - 6.5 %  Basic metabolic panel     Status: Abnormal   Collection Time: 06/28/15  2:45 PM  Result Value Ref Range   Sodium 142 135 - 145 mEq/L   Potassium 3.5 3.5 - 5.1 mEq/L   Chloride 107 96 - 112 mEq/L   CO2 29 19 - 32 mEq/L   Glucose, Bld 103 (H) 70 - 99 mg/dL   BUN 26 (H) 6 - 23 mg/dL   Creatinine, Ser 4.09 0.40 - 1.20 mg/dL   Calcium 9.2 8.4 - 81.1 mg/dL   GFR 91.47 (L) >82.95 mL/min

## 2015-06-28 NOTE — Assessment & Plan Note (Signed)
S: Patient continues to get mammograms- we discussed how this is unlikely to affect life expectancy. They also offer bone density tests- may be [facility]is A/P:. I advised her to get a bone density and follow up. I suspect even if doesn't affect life expectancy, avoiding a fracture could increase quality of life and would consider if progressed to osteoporosis.

## 2015-07-02 ENCOUNTER — Telehealth: Payer: Self-pay | Admitting: Family Medicine

## 2015-07-02 MED ORDER — ONETOUCH DELICA LANCETS 33G MISC: Status: DC

## 2015-07-02 NOTE — Telephone Encounter (Signed)
This will be new [facility] to [facility]. Pt needs one touch delica 33 gauge lancet  1bx

## 2015-07-02 NOTE — Telephone Encounter (Signed)
Rx sent in

## 2015-07-03 ENCOUNTER — Telehealth: Payer: Self-pay | Admitting: Family Medicine

## 2015-07-03 MED ORDER — INSULIN PEN NEEDLE 32G X 4 MM MISC: 1.0000 | Freq: Every day | Status: DC

## 2015-07-03 MED ORDER — ONETOUCH DELICA LANCETS 33G MISC: Status: DC

## 2015-07-03 NOTE — Telephone Encounter (Signed)
Pharmacy call to say that pt said she does need  injection device for insulin (B-D PEN) DEVI  She need Dola Argyle LANCETS 33G MISC    Pharmacy [facility]     Ref no [id number]

## 2015-07-04 DIAGNOSIS — M47817 Spondylosis without myelopathy or radiculopathy, lumbosacral region: Secondary | ICD-10-CM | POA: Diagnosis not present

## 2015-07-05 ENCOUNTER — Telehealth: Payer: Self-pay | Admitting: Family Medicine

## 2015-07-05 NOTE — Telephone Encounter (Signed)
Home care advise given.  No further action needed.

## 2015-07-05 NOTE — Telephone Encounter (Signed)
TELEPHONE ADVICE RECORD [facility]  Patient Name: Victoria Gomez  DOB: [birth year]    Initial Comment Caller states i went yesterday and got a pain shot now my BS is going up    Nurse Assessment  Nurse: Odis Luster, RN, Bjorn Loser Date/Time Lamount Cohen Time): 07/05/2015 4:17:39 PM  Confirm and document reason for call. If symptomatic, describe symptoms. ---Caller states i went yesterday and got a pain shot now my BS is going up. Reports blood sugar today was going up from 98-168 now. Reports that she got a epidural steroid injections. Reports that she has been under control with diet only, no meds are used.  Has the patient traveled out of the country within the last 30 days? ---No  Does the patient require triage? ---Yes  Related visit to physician within the last 2 weeks? ---Yes  Does the PT have any chronic conditions? (i.e. diabetes, asthma, etc.) ---Yes  List chronic conditions. ---arthritis; diabetes, a-fib (takes Eliquis); CHF;     Guidelines    Guideline Title Affirmed Question Affirmed Notes  Diabetes - High Blood Sugar Blood glucose 60-240 mg/dl (3.5 -13 mmol/l) (all triage questions negative)    Final Disposition User   [facility], RN, Bjorn Loser    Comments  Nurse advised that steroids often cause the blood sugar to rise as this is a common side effect of steroid use. Advised to keep a log of daily BS and call if symptoms arise. She reports that she has a follow up appt with Dr. Durene Cal is a few weeks. Advised to keep a daily record of blood sugar and take with her to appt so that MD can see what her BS has been doing. Caller voiced understanding.   Disagree/Comply: Comply

## 2015-07-19 DIAGNOSIS — M4696 Unspecified inflammatory spondylopathy, lumbar region: Secondary | ICD-10-CM | POA: Diagnosis not present

## 2015-07-19 DIAGNOSIS — M545 Low back pain: Secondary | ICD-10-CM | POA: Diagnosis not present

## 2015-09-05 ENCOUNTER — Other Ambulatory Visit: Payer: Self-pay | Admitting: Cardiology

## 2015-09-13 ENCOUNTER — Other Ambulatory Visit: Payer: Self-pay | Admitting: Cardiology

## 2015-09-13 MED ORDER — APIXABAN 2.5 MG PO TABS: 2.5000 mg | ORAL_TABLET | Freq: Two times a day (BID) | ORAL | Status: DC

## 2015-09-20 DIAGNOSIS — N3010 Interstitial cystitis (chronic) without hematuria: Secondary | ICD-10-CM | POA: Diagnosis not present

## 2015-09-27 DIAGNOSIS — N3010 Interstitial cystitis (chronic) without hematuria: Secondary | ICD-10-CM | POA: Diagnosis not present

## 2015-11-01 ENCOUNTER — Ambulatory Visit: Payer: Medicare Other | Admitting: Family Medicine

## 2015-11-22 ENCOUNTER — Other Ambulatory Visit: Payer: Self-pay | Admitting: Family Medicine

## 2015-12-12 ENCOUNTER — Encounter: Payer: Self-pay | Admitting: Family Medicine

## 2015-12-12 ENCOUNTER — Ambulatory Visit (INDEPENDENT_AMBULATORY_CARE_PROVIDER_SITE_OTHER): Payer: Medicare Other | Admitting: Family Medicine

## 2015-12-12 VITALS — BP 130/70 | HR 65 | Temp 99.0°F | Wt 163.0 lb

## 2015-12-12 DIAGNOSIS — I10 Essential (primary) hypertension: Secondary | ICD-10-CM

## 2015-12-12 DIAGNOSIS — E119 Type 2 diabetes mellitus without complications: Secondary | ICD-10-CM

## 2015-12-12 DIAGNOSIS — E034 Atrophy of thyroid (acquired): Secondary | ICD-10-CM

## 2015-12-12 DIAGNOSIS — N183 Chronic kidney disease, stage 3 unspecified: Secondary | ICD-10-CM

## 2015-12-12 DIAGNOSIS — H9202 Otalgia, left ear: Secondary | ICD-10-CM

## 2015-12-12 DIAGNOSIS — Z780 Asymptomatic menopausal state: Secondary | ICD-10-CM | POA: Diagnosis not present

## 2015-12-12 DIAGNOSIS — I5032 Chronic diastolic (congestive) heart failure: Secondary | ICD-10-CM

## 2015-12-12 DIAGNOSIS — E785 Hyperlipidemia, unspecified: Secondary | ICD-10-CM

## 2015-12-12 DIAGNOSIS — E038 Other specified hypothyroidism: Secondary | ICD-10-CM

## 2015-12-12 LAB — COMPREHENSIVE METABOLIC PANEL
ALBUMIN: 3.9 g/dL (ref 3.5–5.2)
ALT: 13 U/L (ref 0–35)
AST: 18 U/L (ref 0–37)
Alkaline Phosphatase: 80 U/L (ref 39–117)
BUN: 23 mg/dL (ref 6–23)
CALCIUM: 9.6 mg/dL (ref 8.4–10.5)
CHLORIDE: 106 meq/L (ref 96–112)
CO2: 27 meq/L (ref 19–32)
Creatinine, Ser: 1.16 mg/dL (ref 0.40–1.20)
GFR: 46.75 mL/min — ABNORMAL LOW (ref >60.00)
Glucose, Bld: 109 mg/dL — ABNORMAL HIGH (ref 70–99)
POTASSIUM: 4.3 meq/L (ref 3.5–5.1)
SODIUM: 141 meq/L (ref 135–145)
Total Bilirubin: 0.3 mg/dL (ref 0.2–1.2)
Total Protein: 7.2 g/dL (ref 6.0–8.3)

## 2015-12-12 LAB — CBC
HCT: 38.8 % (ref 36.0–46.0)
HEMOGLOBIN: 12.6 g/dL (ref 12.0–15.0)
MCHC: 32.5 g/dL (ref 30.0–36.0)
MCV: 86.9 fl (ref 78.0–100.0)
PLATELETS: 240 10*3/uL (ref 150.0–400.0)
RBC: 4.47 Mil/uL (ref 3.87–5.11)
RDW: 13.2 % (ref 11.5–15.5)
WBC: 6.7 10*3/uL (ref 4.0–10.5)

## 2015-12-12 LAB — HEMOGLOBIN A1C: HEMOGLOBIN A1C: 6.5 % (ref 4.6–6.5)

## 2015-12-12 LAB — LDL CHOLESTEROL, DIRECT: Direct LDL: 68 mg/dL

## 2015-12-12 LAB — TSH: TSH: 0.75 u[IU]/mL (ref 0.35–4.50)

## 2015-12-12 MED ORDER — FUROSEMIDE 40 MG PO TABS: 60.0000 mg | ORAL_TABLET | Freq: Every day | ORAL | Status: DC

## 2015-12-12 MED ORDER — NITROGLYCERIN 0.4 MG SL SUBL: 0.4000 mg | SUBLINGUAL_TABLET | SUBLINGUAL | Status: DC | PRN

## 2015-12-12 NOTE — Assessment & Plan Note (Signed)
S: controlled. On Valsartan 320 mg and lasix   BP Readings from Last 3 Encounters:  12/12/15 130/70  06/28/15 112/64  05/17/15 104/58  A/P:Continue current medications as doing well

## 2015-12-12 NOTE — Progress Notes (Signed)
Tana Conch, MD  Subjective:  Victoria Gomez is a 82 y.o. year old very pleasant female patient who presents for/with See problem oriented charting ROS- denies chest pain or shortness of breath with activity. Does feel more short of breath when she lays down at night. No worsening edema.   Past Medical History-  Patient Active Problem List   Diagnosis Date Noted  . Diabetes mellitus II 09/22/2013    Priority: High  . Atrial fibrillation (HCC) 09/21/2013    Priority: High  . H/O sinus bradycardia 08/10/2012    Priority: High  . Diastolic HF (heart failure) (HCC) 11/17/2011    Priority: High  . Coronary atherosclerosis 08/03/2007    Priority: High  . Osteopenia 06/28/2015    Priority: Medium  . Vitamin B12 deficiency 07/28/2014    Priority: Medium  . CKD (chronic kidney disease), stage III 09/22/2013    Priority: Medium  . LBBB (left bundle branch block) 09/22/2013    Priority: Medium  . Interstitial cystitis     Priority: Medium  . Hyperlipidemia 03/07/2008    Priority: Medium  . Hypothyroidism 08/03/2007    Priority: Medium  . Essential hypertension 08/03/2007    Priority: Medium  . Internal hemorrhoids     Priority: Low  . Orthostatic hypotension 08/10/2012    Priority: Low  . Fatigue 04/20/2012    Priority: Low  . ARTHRITIS 12/30/2010    Priority: Low  . Left lumbar radiculopathy 12/11/2010    Priority: Low  . Esophageal reflux 10/18/2007    Priority: Low  . BARRETT'S ESOPHAGUS, HX OF 08/03/2007    Priority: Low    Medications- reviewed and updated Current Outpatient Prescriptions  Medication Sig Dispense Refill  . apixaban (ELIQUIS) 2.5 MG TABS tablet Take 1 tablet (2.5 mg total) by mouth 2 (two) times daily. 180 tablet 2  . furosemide (LASIX) 40 MG tablet Take 1.5 tablets (60 mg total) by mouth daily. 60 tablet 1  . glucose blood (ONETOUCH VERIO) test strip Use to test blood sugars daily. Dx: E11.9 100 each 12  . isosorbide mononitrate (IMDUR) 60 MG 24  hr tablet TAKE 1 AND 1/2 TABLETS     ( ) DAILY *KREMERS      URBAN MFR* 135 tablet 3  . lansoprazole (PREVACID) 30 MG capsule Take 1 capsule (30 mg total) by mouth 2 (two) times daily. 180 capsule 3  . levothyroxine (SYNTHROID, LEVOTHROID) 50 MCG tablet Take 1 tablet (50 mcg total) by mouth daily before breakfast. 90 tablet 3  . ONETOUCH DELICA LANCETS 33G MISC Use to test blood sugars daily. Dx: E11.9 100 each 11  . pentosan polysulfate (ELMIRON) 100 MG capsule Take 100 mg by mouth every morning.     . potassium chloride (KLOR-CON 10) 10 MEQ tablet Take 1 tablet (10 mEq total) by mouth daily. 90 tablet 3  . pravastatin (PRAVACHOL) 40 MG tablet TAKE 1 TABLET DAILY 90 tablet 3  . valsartan (DIOVAN) 320 MG tablet Take 1 tablet (320 mg total) by mouth daily. 90 tablet 3  . acetaminophen (TYLENOL) 500 MG tablet Take 500 mg by mouth every 6 (six) hours as needed for mild pain. Reported on 12/12/2015    . nitroGLYCERIN (NITROSTAT) 0.4 MG SL tablet Place 1 tablet (0.4 mg total) under the tongue every 5 (five) minutes as needed for chest pain. If pain after 3 pills- call 911 20 tablet 2  . [DISCONTINUED] potassium chloride (K-DUR,KLOR-CON) 10 MEQ tablet Take 1 tablet (10 mEq total) by mouth  daily. 90 tablet 3   No current facility-administered medications for this visit.    Objective: BP 130/70 mmHg  Pulse 65  Temp(Src) 99 F (37.2 C)  Wt 163 lb (73.936 kg) Gen: NAD, resting comfortably R Tm normal, left TM has a triangular shape area that appears to possibly be a perforation not noted prior CV: irregularly irregular no murmurs rubs or gallops Lungs: CTAB no crackles, wheeze, rhonchi Abdomen: soft/nontender/nondistended/normal bowel sounds. No rebound or guarding.  Ext: trace edema Skin: warm, dry Neuro: grossly normal, moves all extremities  Assessment/Plan:  Diabetes mellitus II S: controlled without medications. Most sugars in 100-130 range.  A/P: continue without medicine. At patient  age a1c goal 7.5 likely reasonable. Continue to monitor every 6 months. Update foot exam next visit.   Essential hypertension S: controlled. On Valsartan 320 mg and lasix 60mg   BP Readings from Last 3 Encounters:  12/12/15 130/70  06/28/15 112/64  05/17/15 104/58  A/P:Continue current medications as doing well   Diastolic HF (heart failure) S: weight stable. No worsening of symptoms- declines chest pain or shortness of breath with her normal activity. Compliant with lasix 60mg . No worsening edema A/P: continue current medication, follow up 6 months   Hypothyroidism S: controlled on levothyroxine Lab Results  Component Value Date   TSH 0.75 12/12/2015  A/P: continue current dose   Hyperlipidemia S: well controlled on pravastatin 40mg  with LDL <70. No myalgias.  Lab Results  Component Value Date   CHOL 129 09/15/2014   HDL 43.50 09/15/2014   LDLCALC 69 09/15/2014   LDLDIRECT 68.0 12/12/2015   TRIG 82.0 09/15/2014   CHOLHDL 3 09/15/2014   A/P: no change today    CKD (chronic kidney disease), stage III GFR stable in 40-50 range. Repeat q6 months.   Left ear pain.  S:Patient states she had about 3 week of sinusitis symptoms. These eventually cleared but then she started with bilateral ear pain L >>R. She also slept on the couch one night and has had some left neck pain for several weeks since that time A/P: possible perforation of ear drum and continues to have ear pain after prior sinusitis. Will consult ENT for their opinion. In regards to neck- appears MSK strain, advised regular icing program.   Return in about 6 months (around 06/10/2016) for follow up- or sooner if needed. Return precautions advised.   Orders Placed This Encounter  Procedures  . DG Bone Density    Standing Status: Future     Number of Occurrences:      Standing Expiration Date: 02/08/2017    Order Specific Question:  Reason for Exam (SYMPTOM  OR DIAGNOSIS REQUIRED)    Answer:  post menopausal     Order Specific Question:  Preferred imaging location?    Answer:  [facility]  . CBC    [place]  . Comprehensive metabolic panel    [place]  . Hemoglobin A1c    [place]  . TSH    [place]  . LDL cholesterol, direct    [place]  . Ambulatory referral to ENT    Referral Priority:  Routine    Referral Type:  Consultation    Referral Reason:  Specialty Services Required    Requested Specialty:  Otolaryngology    Number of Visits Requested:  1    Meds ordered this encounter  Medications  . furosemide (LASIX) 40 MG tablet    Sig: Take 1.5 tablets (60 mg total) by mouth daily.  Dispense:  60 tablet    Refill:  1  . nitroGLYCERIN (NITROSTAT) 0.4 MG SL tablet    Sig: Place 1 tablet (0.4 mg total) under the tongue every 5 (five) minutes as needed for chest pain. If pain after 3 pills- call 911    Dispense:  20 tablet    Refill:  2

## 2015-12-12 NOTE — Assessment & Plan Note (Signed)
S: well controlled on pravastatin  with LDL <70. No myalgias.  Lab Results  Component Value Date   CHOL 129 09/15/2014   HDL 43.50 09/15/2014   LDLCALC 69 09/15/2014   LDLDIRECT 68.0 12/12/2015   TRIG 82.0 09/15/2014   CHOLHDL 3 09/15/2014   A/P: no change today

## 2015-12-12 NOTE — Assessment & Plan Note (Signed)
GFR stable in 40-50 range. Repeat q6 months.

## 2015-12-12 NOTE — Assessment & Plan Note (Signed)
S: controlled without medications. Most sugars in 100-130 range.  A/P: continue without medicine. At patient age a1c goal 7.5 likely reasonable. Continue to monitor every 6 months. Update foot exam next visit.

## 2015-12-12 NOTE — Assessment & Plan Note (Signed)
S: controlled on levothyroxine Lab Results  Component Value Date   TSH 0.75 12/12/2015  A/P: continue current dose

## 2015-12-12 NOTE — Assessment & Plan Note (Signed)
S: weight stable. No worsening of symptoms- declines chest pain or shortness of breath with her normal activity. Compliant with lasix . No worsening edema A/P: continue current medication, follow up 6 months

## 2015-12-12 NOTE — Patient Instructions (Addendum)
Schedule bone density at check out.  Labs before you leave  No changes in medicine unless we call you and tell you  We will call you within a week about your referral to ENT for left ear pain. If you do not hear within 2 weeks, give [place] a call.   Would use ice 3x a day for at least 5 days and avoid positions that worsen the neck pain

## 2015-12-13 DIAGNOSIS — H903 Sensorineural hearing loss, bilateral: Secondary | ICD-10-CM | POA: Diagnosis not present

## 2015-12-13 DIAGNOSIS — H6123 Impacted cerumen, bilateral: Secondary | ICD-10-CM | POA: Diagnosis not present

## 2015-12-13 DIAGNOSIS — H9203 Otalgia, bilateral: Secondary | ICD-10-CM | POA: Diagnosis not present

## 2015-12-14 ENCOUNTER — Other Ambulatory Visit: Payer: Self-pay | Admitting: Family Medicine

## 2015-12-14 MED ORDER — POTASSIUM CHLORIDE ER 10 MEQ PO TBCR: 10.0000 meq | EXTENDED_RELEASE_TABLET | Freq: Every day | ORAL | Status: DC

## 2015-12-14 NOTE — Telephone Encounter (Signed)
Rx done. 

## 2015-12-14 NOTE — Telephone Encounter (Signed)
Pt request refill of the following: potassium chloride (KLOR-CON 10) 10 MEQ tablet   Phamacy: [facility]

## 2015-12-20 ENCOUNTER — Ambulatory Visit (INDEPENDENT_AMBULATORY_CARE_PROVIDER_SITE_OTHER)
Admission: RE | Admit: 2015-12-20 | Discharge: 2015-12-20 | Disposition: A | Discharge status: Home / Self Care | Payer: Medicare Other | Source: Ambulatory Visit | Attending: Family Medicine | Admitting: Family Medicine

## 2015-12-20 DIAGNOSIS — Z780 Asymptomatic menopausal state: Secondary | ICD-10-CM

## 2016-01-02 ENCOUNTER — Ambulatory Visit (INDEPENDENT_AMBULATORY_CARE_PROVIDER_SITE_OTHER): Payer: Medicare Other | Admitting: Podiatry

## 2016-01-02 ENCOUNTER — Encounter: Payer: Self-pay | Admitting: Podiatry

## 2016-01-02 ENCOUNTER — Telehealth: Payer: Self-pay | Admitting: Family Medicine

## 2016-01-02 NOTE — Telephone Encounter (Signed)
Pt said she went to the diabetic foot  doctor and they told her that she had to be diabetic and not pre diabetic so they would not give her a foot exam.

## 2016-01-02 NOTE — Progress Notes (Signed)
   Subjective:    Patient ID: Victoria Gomez, female    DOB: 01/03/1925, 82 y.o.   MRN: [id number]  HPI  PT/SON CAME IN THE TREATMENT TO GET TOENAIL/DIABETIC FOOT CHECK  AND EXPLAIN NAIL TRIM POSSIBLE NOT COVERED BY INSURANCE. PT. DECIDED TO LEAVE NOT TO HAVE THE TREATMENT.  Review of Systems     Objective:   Physical Exam        Assessment & Plan:    I have reviewed the above note written by Irene Pap and agree with the note. I did not enter the treatment room or examined this patient today

## 2016-01-02 NOTE — Progress Notes (Deleted)
   Subjective:    Patient ID: CONSEPCION UTT, female    DOB: 1924/11/18, 82 y.o.   MRN: [id number]  HPI    Review of Systems  HENT: Positive for hearing loss and sinus pressure.   Eyes: Positive for itching.  Respiratory: Positive for cough and shortness of breath.   Cardiovascular: Positive for chest pain.  Musculoskeletal: Positive for back pain.  Hematological: Bruises/bleeds easily.  All other systems reviewed and are negative.      Objective:   Physical Exam        Assessment & Plan:

## 2016-01-03 NOTE — Telephone Encounter (Signed)
Patient IS diabetic Lab Results  Component Value Date   HGBA1C 6.5 12/12/2015  An a1c of 6.5 = diabetes even if she is not on medicine.

## 2016-01-03 NOTE — Telephone Encounter (Signed)
See below

## 2016-01-04 NOTE — Telephone Encounter (Signed)
FYI: called and spoke with [facility] and they stated that the pt came in for her foot exam and wanted her toenails cut as part of the foot exam and upon explanation that the foot exam is not always covered with toenail clipping included pt stated that she did not want to have the exam done if the nail clipping was not going to be covered so she decided to leave and did not have anything done.

## 2016-01-26 ENCOUNTER — Encounter: Payer: Self-pay | Admitting: Adult Health

## 2016-01-26 ENCOUNTER — Ambulatory Visit (INDEPENDENT_AMBULATORY_CARE_PROVIDER_SITE_OTHER): Payer: Medicare Other | Admitting: Adult Health

## 2016-01-26 VITALS — BP 130/82 | HR 90 | Temp 98.7°F | Resp 20 | Ht 63.0 in | Wt 161.0 lb

## 2016-01-26 DIAGNOSIS — J069 Acute upper respiratory infection, unspecified: Secondary | ICD-10-CM

## 2016-01-26 MED ORDER — DOXYCYCLINE HYCLATE 100 MG PO CAPS: 100.0000 mg | ORAL_CAPSULE | Freq: Two times a day (BID) | ORAL | Status: DC

## 2016-01-26 MED ORDER — BENZONATATE 100 MG PO CAPS: 100.0000 mg | ORAL_CAPSULE | Freq: Two times a day (BID) | ORAL | Status: DC | PRN

## 2016-01-26 NOTE — Patient Instructions (Addendum)
It was great meeting you today!  Your exam is consistent with an upper respiratory infection.   I have sent in a prescription for Doxycycline and Tessalon pearls, Take as directed.   You can also pick up some Mucinex at the drug store to help with the cough.     Upper Respiratory Infection, Adult Most upper respiratory infections (URIs) are a viral infection of the air passages leading to the lungs. A URI affects the nose, throat, and upper air passages. The most common type of URI is nasopharyngitis and is typically referred to as "the common cold." URIs run their course and usually go away on their own. Most of the time, a URI does not require medical attention, but sometimes a bacterial infection in the upper airways can follow a viral infection. This is called a secondary infection. Sinus and middle ear infections are common types of secondary upper respiratory infections. Bacterial pneumonia can also complicate a URI. A URI can worsen asthma and chronic obstructive pulmonary disease (COPD). Sometimes, these complications can require emergency medical care and may be life threatening.  CAUSES Almost all URIs are caused by viruses. A virus is a type of germ and can spread from one person to another.  RISKS FACTORS You may be at risk for a URI if:   You smoke.   You have chronic heart or lung disease.  You have a weakened defense (immune) system.   You are very young or very old.   You have nasal allergies or asthma.  You work in crowded or poorly ventilated areas.  You work in health care facilities or schools. SIGNS AND SYMPTOMS  Symptoms typically develop 2-3 days after you come in contact with a cold virus. Most viral URIs last 7-10 days. However, viral URIs from the influenza virus (flu virus) can last 14-18 days and are typically more severe. Symptoms may include:   Runny or stuffy (congested) nose.   Sneezing.   Cough.   Sore throat.   Headache.    Fatigue.   Fever.   Loss of appetite.   Pain in your forehead, behind your eyes, and over your cheekbones (sinus pain).  Muscle aches.  DIAGNOSIS  Your health care provider may diagnose a URI by:  Physical exam.  Tests to check that your symptoms are not due to another condition such as:  Strep throat.  Sinusitis.  Pneumonia.  Asthma. TREATMENT  A URI goes away on its own with time. It cannot be cured with medicines, but medicines may be prescribed or recommended to relieve symptoms. Medicines may help:  Reduce your fever.  Reduce your cough.  Relieve nasal congestion. HOME CARE INSTRUCTIONS   Take medicines only as directed by your health care provider.   Gargle warm saltwater or take cough drops to comfort your throat as directed by your health care provider.  Use a warm mist humidifier or inhale steam from a shower to increase air moisture. This may make it easier to breathe.  Drink enough fluid to keep your urine clear or pale yellow.   Eat soups and other clear broths and maintain good nutrition.   Rest as needed.   Return to work when your temperature has returned to normal or as your health care provider advises. You may need to stay home longer to avoid infecting others. You can also use a face mask and careful hand washing to prevent spread of the virus.  Increase the usage of your inhaler if you have  asthma.   Do not use any tobacco products, including cigarettes, chewing tobacco, or electronic cigarettes. If you need help quitting, ask your health care provider. PREVENTION  The best way to protect yourself from getting a cold is to practice good hygiene.   Avoid oral or hand contact with people with cold symptoms.   Wash your hands often if contact occurs.  There is no clear evidence that vitamin C, vitamin E, echinacea, or exercise reduces the chance of developing a cold. However, it is always recommended to get plenty of rest,  exercise, and practice good nutrition.  SEEK MEDICAL CARE IF:   You are getting worse rather than better.   Your symptoms are not controlled by medicine.   You have chills.  You have worsening shortness of breath.  You have brown or red mucus.  You have yellow or brown nasal discharge.  You have pain in your face, especially when you bend forward.  You have a fever.  You have swollen neck glands.  You have pain while swallowing.  You have white areas in the back of your throat. SEEK IMMEDIATE MEDICAL CARE IF:   You have severe or persistent:  Headache.  Ear pain.  Sinus pain.  Chest pain.  You have chronic lung disease and any of the following:  Wheezing.  Prolonged cough.  Coughing up blood.  A change in your usual mucus.  You have a stiff neck.  You have changes in your:  Vision.  Hearing.  Thinking.  Mood. MAKE SURE YOU:   Understand these instructions.  Will watch your condition.  Will get help right away if you are not doing well or get worse.   This information is not intended to replace advice given to you by your health care provider. Make sure you discuss any questions you have with your health care provider.   Document Released: 04/22/2001 Document Revised: 03/13/2015 Document Reviewed: 02/01/2014 Elsevier Interactive Patient Education [employer]2016 Elsevier Inc.

## 2016-01-26 NOTE — Progress Notes (Signed)
   Subjective:    Patient ID: Victoria Gomez, female    DOB: [birth year], 82 y.o.   MRN: [id number]  HPI  82 year old female who presents to the office today for sinus pain and pressure, nasal congestion feeling of " stopped up ears", semi productive cough, fever up to 101. She has had these symptoms for 7 days.   Has been using Robatussin which  Isn't helping much   Review of Systems  Constitutional: Positive for activity change, fatigue and unexpected weight change. Negative for chills.  HENT: Positive for congestion and sinus pressure. Negative for ear discharge and ear pain.   Respiratory: Positive for cough. Negative for shortness of breath and wheezing.   Neurological: Positive for headaches. Negative for weakness and light-headedness.       Objective:   Physical Exam  Constitutional: She is oriented to person, place, and time. She appears well-developed and well-nourished. No distress.  HENT:  Head: Normocephalic and atraumatic.  Right Ear: External ear normal.  Left Ear: External ear normal.  Nose: Nose normal.  Mouth/Throat: Oropharynx is clear and moist. No oropharyngeal exudate.  Cardiovascular: Normal rate, regular rhythm, normal heart sounds and intact distal pulses.  Exam reveals no gallop and no friction rub.   No murmur heard. Pulmonary/Chest: Effort normal and breath sounds normal. No respiratory distress. She has no wheezes. She has no rales. She exhibits no tenderness.  Neurological: She is alert and oriented to person, place, and time.  Skin: Skin is warm and dry. No rash noted. She is not diaphoretic. No erythema. No pallor.  Psychiatric: She has a normal mood and affect. Her behavior is normal. Judgment and thought content normal.  Nursing note and vitals reviewed.     Assessment & Plan:  1. Acute upper respiratory infection - doxycycline (VIBRAMYCIN) 100 MG capsule; Take 1 capsule (100 mg total) by mouth 2 (two) times daily.  Dispense: 14 capsule; Refill:  0 - benzonatate (TESSALON) 100 MG capsule; Take 1 capsule (100 mg total) by mouth 2 (two) times daily as needed for cough.  Dispense: 20 capsule; Refill: 1

## 2016-01-26 NOTE — Progress Notes (Signed)
Pre visit review using our clinic review tool, if applicable. No additional management support is needed unless otherwise documented below in the visit note. 

## 2016-04-03 ENCOUNTER — Other Ambulatory Visit: Payer: Self-pay | Admitting: Cardiology

## 2016-04-03 ENCOUNTER — Other Ambulatory Visit: Payer: Self-pay | Admitting: Family Medicine

## 2016-04-04 NOTE — Telephone Encounter (Signed)
REFILL 

## 2016-04-19 ENCOUNTER — Other Ambulatory Visit: Payer: Self-pay | Admitting: Family Medicine

## 2016-05-01 NOTE — Progress Notes (Signed)
HPI The patient presents for followup up of atrial fibrillation and CAD.  She is status post DCCV but had recurrent atria fib.  She did have some low heart rates. She had her dose of beta blocker reduced and then stopped. She returns for follow up.   Since I saw her she had some back injections and had her Eliquis held for this.  She does not think that this helped much.  She does feel some palpitations in fact is in atrial fibrillation today. She said she'll notice that sometimes at night with her heart racing. She couldn't tell exactly when she went back into atrial fibrillation following cardioversion.  She denies any presyncope or syncope. She says that her heart rate is makes her feel jittery. She's not having any chest pressure, neck or arm discomfort. She's had no weight gain or edema.  Allergies  Allergen Reactions  . Codeine Hives and Nausea And Vomiting  . Sulfonamide Derivatives Swelling    Turns red    Current Outpatient Prescriptions  Medication Sig Dispense Refill  . acetaminophen (TYLENOL) 500 MG tablet Take 500 mg by mouth every 6 (six) hours as needed for mild pain. Reported on 12/12/2015    . apixaban (ELIQUIS) 2.5 MG TABS tablet Take 1 tablet (2.5 mg total) by mouth 2 (two) times daily. 180 tablet 2  . furosemide (LASIX) 40 MG tablet TAKE 1.5 TABLETS (60 MG TOTAL) BY MOUTH DAILY. 60 tablet 5  . glucose blood (ONETOUCH VERIO) test strip Use to test blood sugars daily. Dx: E11.9 100 each 12  . isosorbide mononitrate (IMDUR) 60 MG 24 hr tablet TAKE 1 AND 1/2 TABLETS     (90MG ) DAILY *KREMERS      URBAN MFR* 135 tablet 3  . nitroGLYCERIN (NITROSTAT) 0.4 MG SL tablet Place 1 tablet (0.4 mg total) under the tongue every 5 (five) minutes as needed for chest pain. If pain after 3 pills- call 911 20 tablet 2  . ONETOUCH DELICA LANCETS 33G MISC Use to test blood sugars daily. Dx: E11.9 100 each 11  . pentosan polysulfate (ELMIRON) 100 MG capsule Take 100 mg by mouth every morning.       . potassium chloride (KLOR-CON 10) 10 MEQ tablet Take 1 tablet (10 mEq total) by mouth daily. 90 tablet 1  . pravastatin (PRAVACHOL) 40 MG tablet TAKE 1 TABLET DAILY 90 tablet 3  . SYNTHROID 50 MCG tablet TAKE 1 TABLET DAILY BEFORE BREAKFAST 90 tablet 3  . valsartan (DIOVAN) 320 MG tablet Take 1 tablet (320 mg total) by mouth daily. NEED OV. 90 tablet 0  . lansoprazole (PREVACID) 30 MG capsule Take 1 capsule (30 mg total) by mouth 2 (two) times daily. 180 capsule 3  . [DISCONTINUED] potassium chloride (K-DUR,KLOR-CON) 10 MEQ tablet Take 1 tablet (10 mEq total) by mouth daily. 90 tablet 3   No current facility-administered medications for this visit.    Past Medical History  Diagnosis Date  . HYPERLIPIDEMIA 03/07/2008  . HYPERTENSION 08/03/2007  . CAD (coronary artery disease)     a. s/p PCI to RCA '01. b. repeat PCI to RCA '03 2/2 ISR. c. cath 2012: RCA dz, rx medically.  Occluded RCA.  Nonobstructive disease elsewhere  . Chronic combined systolic and diastolic CHF (congestive heart failure) (HCC)     a. EF 40-45% in 2014. b. EF reported to be normal in 03/2015 nuc.  [name]. HYPOTHYROIDISM   . Esophageal reflux   . DIVERTICULOSIS, COLON   . BREAST  MASS, BENIGN   . BARRETT'S ESOPHAGUS, HX OF   . Interstitial cystitis   . Arthritis   . Internal hemorrhoids   . Epistaxis   . Right hand fracture   . Bladder disorder   . Bradycardia 08/2012    a. Eval for symptomatic bradycardia 08/2012 - beta blocker discontinued.  . Orthostatic hypotension 08/2012    a. 08/2012 w/ syncope - meds adjusted.   [name]. PAF (paroxysmal atrial fibrillation) (HCC)     a. Dx 09/2013.  [name]. LBBB (left bundle branch block)   . History of breast lump/mass excision 05/23/2009    Qualifier: History of  By: Lovell SheehanJenkins MD, Balinda QuailsJohn E   . CKD (chronic kidney disease), stage III     Past Surgical History  Procedure Laterality Date  . Breast surgery      benign mass  . Cholecystectomy    . Cesarean section    . Appendectomy    .  Cataract extraction, bilateral    . Tonsillectomy    . Abdominal hysterectomy    . Ankle reconstruction      Left  . Wrist reconstruction      Right  . Cardioversion N/A 10/20/2013  . Cardioversion N/A 04/24/2015    Procedure: CARDIOVERSION;  Surgeon: Lars MassonKatarina H Nelson, MD;  [facility];  Service: Cardiovascular;  Laterality: N/A;    ROS:  As stated in the HPI and negative for all other systems.  PHYSICAL EXAM BP 118/70 mmHg  Pulse 87  Ht 5\' 3"  (1.6 m)  Wt 158 lb (71.668 kg)  BMI 28.00 kg/m2 GENERAL:  Well appearing NECK:  No jugular venous distention, waveform within normal limits, carotid upstroke brisk and symmetric, no bruits, no thyromegaly LUNGS:  Decreased breath sounds in the left lung base with few basilar crackles. BACK:  No CVA tenderness CHEST: Unremarkable HEART:  PMI not displaced or sustained,S1 and S2 within normal limits, no S3, for her, no clicks, no rubs, no murmurs, irregular ABD:  Flat, positive bowel sounds normal in frequency in pitch, no bruits, no rebound, no guarding, no midline pulsatile mass, no hepatomegaly, no splenomegaly EXT:  2 plus pulses throughout, no edema, no cyanosis no clubbing  EKG:    Atrial fibrillation, left bundle branch block, left axis deviation, rate 87.  05/02/2016  Lab Results  Component Value Date   CHOL 129 09/15/2014   TRIG 82.0 09/15/2014   HDL 43.50 09/15/2014   LDLCALC 69 09/15/2014   LDLDIRECT 68.0 12/12/2015     ASSESSMENT AND PLAN  ATRIAL FIB -  I will continue her NOAC.  We stopped beta blocker previously because of slow heart rate that she's going take this when necessary we talked about parameters. She'll take metoprolol 25 mg or one half of a tablet based on palpitations or underwear occasionally when she actually has some increased pressures.  CORONARY ARTERY DISEASE -   She had a low risk nuclear study.  No change in therapy is indicated.   HYPERTENSION -  Her blood pressure fluctuates and will be  managed as above.   Diastolic HF (heart failure) - She seems to be euvolemic. No change in therapy is indicated.  No further cardiovascular testing is indicated.   CAROTID - Mild last year.   No follow-up is indicated.  Dyslipidemia -   Her LDL was 68 this year.   She will continue on the meds as listed.

## 2016-05-02 ENCOUNTER — Encounter: Payer: Self-pay | Admitting: Cardiology

## 2016-05-02 ENCOUNTER — Ambulatory Visit (INDEPENDENT_AMBULATORY_CARE_PROVIDER_SITE_OTHER): Payer: Medicare Other | Admitting: Cardiology

## 2016-05-02 VITALS — BP 118/70 | HR 87 | Ht 63.0 in | Wt 158.0 lb

## 2016-05-02 DIAGNOSIS — I480 Paroxysmal atrial fibrillation: Secondary | ICD-10-CM

## 2016-05-02 DIAGNOSIS — R002 Palpitations: Secondary | ICD-10-CM

## 2016-05-02 MED ORDER — METOPROLOL TARTRATE 25 MG PO TABS: 12.5000 mg | ORAL_TABLET | ORAL | Status: DC | PRN

## 2016-05-02 NOTE — Patient Instructions (Addendum)
Medication Instructions:  START Metoprolol Tart take 1/2(12.5 mg) tablets as needed for palpitation  Labwork: NONE  Testing/Procedures: NONE  Follow-Up: Your physician wants you to follow-up in: 6 Months. You will receive a reminder letter in the mail two months in advance. If you don't receive a letter, please call our office to schedule the follow-up appointment.   Any Other Special Instructions Will Be Listed Below (If Applicable).  If you need a refill on your cardiac medications before your next appointment, please call your pharmacy.

## 2016-05-09 ENCOUNTER — Ambulatory Visit: Payer: Medicare Other | Admitting: Cardiology

## 2016-06-03 ENCOUNTER — Other Ambulatory Visit: Payer: Self-pay | Admitting: *Deleted

## 2016-06-03 MED ORDER — METOPROLOL TARTRATE 25 MG PO TABS: 12.5000 mg | ORAL_TABLET | ORAL | 1 refills | Status: DC | PRN

## 2016-06-12 ENCOUNTER — Ambulatory Visit: Payer: Medicare Other | Admitting: Family Medicine

## 2016-06-25 DIAGNOSIS — L03031 Cellulitis of right toe: Secondary | ICD-10-CM | POA: Diagnosis not present

## 2016-06-25 DIAGNOSIS — M722 Plantar fascial fibromatosis: Secondary | ICD-10-CM | POA: Diagnosis not present

## 2016-06-25 DIAGNOSIS — M79671 Pain in right foot: Secondary | ICD-10-CM | POA: Diagnosis not present

## 2016-06-25 DIAGNOSIS — M79672 Pain in left foot: Secondary | ICD-10-CM | POA: Diagnosis not present

## 2016-06-26 ENCOUNTER — Encounter: Payer: Self-pay | Admitting: Family Medicine

## 2016-06-26 ENCOUNTER — Ambulatory Visit (INDEPENDENT_AMBULATORY_CARE_PROVIDER_SITE_OTHER): Payer: Medicare Other | Admitting: Family Medicine

## 2016-06-26 VITALS — BP 116/78 | HR 60 | Wt 159.0 lb

## 2016-06-26 DIAGNOSIS — E034 Atrophy of thyroid (acquired): Secondary | ICD-10-CM | POA: Diagnosis not present

## 2016-06-26 DIAGNOSIS — N183 Chronic kidney disease, stage 3 unspecified: Secondary | ICD-10-CM

## 2016-06-26 DIAGNOSIS — E785 Hyperlipidemia, unspecified: Secondary | ICD-10-CM

## 2016-06-26 DIAGNOSIS — I10 Essential (primary) hypertension: Secondary | ICD-10-CM

## 2016-06-26 DIAGNOSIS — I5032 Chronic diastolic (congestive) heart failure: Secondary | ICD-10-CM

## 2016-06-26 DIAGNOSIS — E119 Type 2 diabetes mellitus without complications: Secondary | ICD-10-CM | POA: Diagnosis not present

## 2016-06-26 DIAGNOSIS — E038 Other specified hypothyroidism: Secondary | ICD-10-CM

## 2016-06-26 NOTE — Progress Notes (Addendum)
Subjective:  Victoria Gomez is a 82 y.o. year old very pleasant female patient who presents for/with See problem oriented charting ROS- slight increase in edema, dry hacky cough, orthopnea-  2 pillow by folding pillow over.see any ROS included in HPI as well.   Past Medical History-  Patient Active Problem List   Diagnosis Date Noted  . Diabetes mellitus II 09/22/2013    Priority: High  . Atrial fibrillation (HCC) 09/21/2013    Priority: High  . H/O sinus bradycardia 08/10/2012    Priority: High  . Diastolic HF (heart failure) (HCC) 11/17/2011    Priority: High  . Coronary atherosclerosis 08/03/2007    Priority: High  . Osteopenia 06/28/2015    Priority: Medium  . Vitamin B12 deficiency 07/28/2014    Priority: Medium  . CKD (chronic kidney disease), stage III 09/22/2013    Priority: Medium  . LBBB (left bundle branch block) 09/22/2013    Priority: Medium  . Interstitial cystitis     Priority: Medium  . Hyperlipidemia 03/07/2008    Priority: Medium  . Hypothyroidism 08/03/2007    Priority: Medium  . Essential hypertension 08/03/2007    Priority: Medium  . Internal hemorrhoids     Priority: Low  . Orthostatic hypotension 08/10/2012    Priority: Low  . Fatigue 04/20/2012    Priority: Low  . ARTHRITIS 12/30/2010    Priority: Low  . Left lumbar radiculopathy 12/11/2010    Priority: Low  . Esophageal reflux 10/18/2007    Priority: Low  . BARRETT'S ESOPHAGUS, HX OF 08/03/2007    Priority: Low    Medications- reviewed and updated Current Outpatient Prescriptions  Medication Sig Dispense Refill  . acetaminophen (TYLENOL) 500 MG tablet Take 500 mg by mouth every 6 (six) hours as needed for mild pain. Reported on 12/12/2015    . apixaban (ELIQUIS) 2.5 MG TABS tablet Take 1 tablet (2.5 mg total) by mouth 2 (two) times daily. 180 tablet 2  . furosemide (LASIX) 40 MG tablet TAKE 1.5 TABLETS (60 MG TOTAL) BY MOUTH DAILY. 60 tablet 5  . glucose blood (ONETOUCH VERIO) test strip  Use to test blood sugars daily. Dx: E11.9 100 each 12  . isosorbide mononitrate (IMDUR) 60 MG 24 hr tablet TAKE 1 AND 1/2 TABLETS     (90MG ) DAILY *KREMERS      URBAN MFR* 135 tablet 3  . metoprolol tartrate (LOPRESSOR) 25 MG tablet Take 0.5 tablets (12.5 mg total) by mouth as needed. For palpitations 90 tablet 1  . nitroGLYCERIN (NITROSTAT) 0.4 MG SL tablet Place 1 tablet (0.4 mg total) under the tongue every 5 (five) minutes as needed for chest pain. If pain after 3 pills- call 911 20 tablet 2  . ONETOUCH DELICA LANCETS 33G MISC Use to test blood sugars daily. Dx: E11.9 100 each 11  . pentosan polysulfate (ELMIRON) 100 MG capsule Take 100 mg by mouth every morning.     . potassium chloride (KLOR-CON 10) 10 MEQ tablet Take 1 tablet (10 mEq total) by mouth daily. 90 tablet 1  . pravastatin (PRAVACHOL) 40 MG tablet TAKE 1 TABLET DAILY 90 tablet 3  . SYNTHROID 50 MCG tablet TAKE 1 TABLET DAILY BEFORE BREAKFAST 90 tablet 3  . valsartan (DIOVAN) 320 MG tablet Take 1 tablet (320 mg total) by mouth daily. NEED OV. 90 tablet 0  . lansoprazole (PREVACID) 30 MG capsule Take 1 capsule (30 mg total) by mouth 2 (two) times daily. 180 capsule 3   No current facility-administered  medications for this visit.     Objective: BP 116/78   Pulse 60   Wt 159 lb (72.1 kg)   SpO2 98%   BMI 28.17 kg/m  Gen: NAD, resting comfortably CV: irregularly irregular no murmurs rubs or gallops Lungs: CTAB no crackles, wheeze, rhonchi Abdomen: soft/nontender/nondistended/normal bowel sounds. No rebound or guarding.  Ext: 1+ edema Skin: warm, dry Neuro: grossly normal, moves all extremities  Diabetic Foot Exam - Simple   Simple Foot Form Diabetic Foot exam was performed with the following findings:  Yes 06/27/2016  5:48 AM  Visual Inspection No deformities, no ulcerations, no other skin breakdown bilaterally:  Yes Sensation Testing Intact to touch and monofilament testing bilaterally:  Yes Pulse Check Posterior  Tibialis and Dorsalis pulse intact bilaterally:  Yes Comments    Assessment/Plan:  Diabetes mellitus II S: well controlled. On no meds. Sugars still in 100-130 range Lab Results  Component Value Date   HGBA1C 6.5 12/12/2015   HGBA1C 6.2 06/28/2015   HGBA1C 6.4 02/23/2015   A/P: update a1c today  Diastolic HF (heart failure) Hypertension Diastolic CHF S: controlled BP. On valsartan 320mg  and lasix 60mg . Toprol with palpitations. Has noted some mild worsening edema.  Weight is up a lb. In addition she has had to start folding her pillow over and seems to have a dry hacky cough at times.  BP Readings from Last 3 Encounters:  06/26/16 116/78  05/02/16 118/70  01/26/16 130/82  A/P:Continue current meds:  But we will do an extra 40mg  of lasix for each day fri-Sunday then have her update me next week on weight, edema, nighttime shortness of breath, and cough  Hypothyroidism S: compliant with levotyroxine 50 mcg Lab Results  Component Value Date   TSH 0.75 12/12/2015  ROS-No hair or nail changes. No heat/cold intolerance. No constipation or diarrhea. Denies shakiness or anxiety.  A/P: doing well, update tsh  by phone next week. Had been doing 6 months but I discussed 3 potentially  Orders Placed This Encounter  Procedures  . CBC    [place]  . Comprehensive metabolic panel    [place]  . LDL cholesterol, direct    [place]  . Hemoglobin A1c    [place]  . TSH    [place]   Return precautions advised.  Tana ConchStephen Hunter, MD

## 2016-06-26 NOTE — Patient Instructions (Addendum)
Labs before you leave  For Friday- Sunday Take 1.5 lasix in the morning then 1 lasix 6 hours later.  Update me next week if this helps you with your shortness of breath at night and your coughing  Health Maintenance Due  Topic Date Due  . FOOT EXAM  10/28/2015  . OPHTHALMOLOGY EXAM - get your eye exam and have them send us records 03/07/2016  . INFLUENZA VACCINE - get when available 06/10/2016

## 2016-06-26 NOTE — Progress Notes (Signed)
Pre visit review using our clinic review tool, if applicable. No additional management support is needed unless otherwise documented below in the visit note. 

## 2016-06-27 LAB — CBC
HCT: 38.2 % (ref 36.0–46.0)
HEMOGLOBIN: 12.8 g/dL (ref 12.0–15.0)
MCHC: 33.5 g/dL (ref 30.0–36.0)
MCV: 87 fl (ref 78.0–100.0)
PLATELETS: 183 10*3/uL (ref 150.0–400.0)
RBC: 4.39 Mil/uL (ref 3.87–5.11)
RDW: 13.8 % (ref 11.5–15.5)
WBC: 6 10*3/uL (ref 4.0–10.5)

## 2016-06-27 LAB — COMPREHENSIVE METABOLIC PANEL
ALT: 10 U/L (ref 0–35)
AST: 15 U/L (ref 0–37)
Albumin: 4 g/dL (ref 3.5–5.2)
Alkaline Phosphatase: 69 U/L (ref 39–117)
BUN: 20 mg/dL (ref 6–23)
CHLORIDE: 108 meq/L (ref 96–112)
CO2: 28 mEq/L (ref 19–32)
Calcium: 9.6 mg/dL (ref 8.4–10.5)
Creatinine, Ser: 1.08 mg/dL (ref 0.40–1.20)
GFR: 50.71 mL/min — AB (ref >60.00)
Glucose, Bld: 92 mg/dL (ref 70–99)
Potassium: 4.5 mEq/L (ref 3.5–5.1)
Sodium: 142 mEq/L (ref 135–145)
Total Bilirubin: 0.3 mg/dL (ref 0.2–1.2)
Total Protein: 6.7 g/dL (ref 6.0–8.3)

## 2016-06-27 LAB — LDL CHOLESTEROL, DIRECT: Direct LDL: 65 mg/dL

## 2016-06-27 LAB — TSH: TSH: 1.2 u[IU]/mL (ref 0.35–4.50)

## 2016-06-27 LAB — HEMOGLOBIN A1C: HEMOGLOBIN A1C: 6.3 % (ref 4.6–6.5)

## 2016-06-27 NOTE — Assessment & Plan Note (Signed)
Hypertension Diastolic CHF S: controlled BP. On valsartan 320mg  and lasix 60mg . Toprol with palpitations. Has noted some mild worsening edema.  Weight is up a lb. In addition she has had to start folding her pillow over and seems to have a dry hacky cough at times.  BP Readings from Last 3 Encounters:  06/26/16 116/78  05/02/16 118/70  01/26/16 130/82  A/P:Continue current meds:  But we will do an extra 40mg  of lasix for each day fri-Sunday then have her update me next week on weight, edema, nighttime shortness of breath, and cough

## 2016-06-27 NOTE — Assessment & Plan Note (Signed)
S: compliant with levotyroxine 50 mcg Lab Results  Component Value Date   TSH 0.75 12/12/2015  ROS-No hair or nail changes. No heat/cold intolerance. No constipation or diarrhea. Denies shakiness or anxiety.  A/P: doing well, update tsh

## 2016-06-27 NOTE — Assessment & Plan Note (Signed)
S: well controlled. On no meds. Sugars still in 100-130 range Lab Results  Component Value Date   HGBA1C 6.5 12/12/2015   HGBA1C 6.2 06/28/2015   HGBA1C 6.4 02/23/2015   A/P: update a1c today

## 2016-06-30 ENCOUNTER — Other Ambulatory Visit: Payer: Self-pay | Admitting: Family Medicine

## 2016-06-30 ENCOUNTER — Other Ambulatory Visit: Payer: Self-pay

## 2016-06-30 MED ORDER — APIXABAN 2.5 MG PO TABS: 2.5000 mg | ORAL_TABLET | Freq: Two times a day (BID) | ORAL | 2 refills | Status: DC

## 2016-07-04 ENCOUNTER — Telehealth: Payer: Self-pay | Admitting: Family Medicine

## 2016-07-04 NOTE — Telephone Encounter (Signed)
No, patient would need to be evaluated. I do not have a copy of note. The podiatrist apparently did not think it was an infection so we would need to see to determine otherwise. I am at Saturday clinic at [place] if patient would want to come for evaluation.

## 2016-07-04 NOTE — Telephone Encounter (Signed)
Pts son would like to see if you could give pt an antibiotics for a precaution from infection.  Pharm:  [facility] [address]  [phone]  Pt has been soaking it in epison salt and it is very red.  Pt went and saw Dr. Merwyn KatosJohn Petery (Podiatrist) at 9:00am this morning and only gave her something for pain.  Pts son would like to have a call back.

## 2016-07-04 NOTE — Telephone Encounter (Signed)
Spoke with son and patient who verbalized understanding and know to call if they think the toe is infected over the weekend.

## 2016-07-08 ENCOUNTER — Telehealth: Payer: Self-pay | Admitting: Family Medicine

## 2016-07-08 NOTE — Telephone Encounter (Signed)
Spoke with patient who states that she already has an appointment for follow up with podiatry. I encouraged her to call them first thing in the morning for follow up after she refused an offered appointment to come in and see another provider here as Dr. Durene CalHunter does not have an open appointment tomorrow but does have one Thursday. She stated she will call them.

## 2016-07-08 NOTE — Telephone Encounter (Signed)
° °  Pt son call and said Mom is still having issues with her foot and son is asking for a antibiotic said pulse is coming out and the toe is red . Does [name] need to see her .

## 2016-07-11 DIAGNOSIS — T8189XA Other complications of procedures, not elsewhere classified, initial encounter: Secondary | ICD-10-CM | POA: Diagnosis not present

## 2016-08-15 ENCOUNTER — Other Ambulatory Visit: Payer: Self-pay | Admitting: Cardiology

## 2016-08-19 ENCOUNTER — Other Ambulatory Visit: Payer: Self-pay | Admitting: Cardiology

## 2016-08-19 DIAGNOSIS — K2100 Gastro-esophageal reflux disease with esophagitis, without bleeding: Secondary | ICD-10-CM

## 2016-08-19 MED ORDER — LANSOPRAZOLE 30 MG PO CPDR: 30.0000 mg | DELAYED_RELEASE_CAPSULE | Freq: Two times a day (BID) | ORAL | 2 refills | Status: DC

## 2016-08-19 NOTE — Telephone Encounter (Signed)
Pt's Rx was sent to pt's pharmacy as requested. Confirmation received.  °

## 2016-08-19 NOTE — Telephone Encounter (Signed)
Pt is requesting a refill on prevacid 30 mg tablet. Would you like to refill this medication? Please advise

## 2016-08-19 NOTE — Telephone Encounter (Signed)
Please refill Prevacid

## 2016-08-19 NOTE — Telephone Encounter (Signed)
Reference#[id number]    *STAT* If patient is at the pharmacy, call can be transferred to refill team.   1. Which medications need to be refilled? (please list name of each medication and dose if known) Need a new prescription for Prevacid 30 mg 1 twice a day 2. Which pharmacy/location (including street and city if local pharmacy) is medication to be sent to?[phone]  3. Do they need a 30 day or 90 day supply? 180 and 3 refills

## 2016-08-25 ENCOUNTER — Other Ambulatory Visit: Payer: Self-pay | Admitting: Family Medicine

## 2016-09-25 DIAGNOSIS — N3010 Interstitial cystitis (chronic) without hematuria: Secondary | ICD-10-CM | POA: Diagnosis not present

## 2016-09-30 ENCOUNTER — Telehealth: Payer: Self-pay | Admitting: Cardiology

## 2016-09-30 NOTE — Telephone Encounter (Signed)
[facility] know that Metoprolol 12.5 mg pt only take when needed for palpitation.

## 2016-09-30 NOTE — Telephone Encounter (Signed)
New message   [facility] [facility] calling  Showing metoprolol .5mg , as inactive  Wants to verify if it can be reactivated  Please call pharm back ([facility])

## 2016-10-06 ENCOUNTER — Other Ambulatory Visit: Payer: Self-pay | Admitting: Family Medicine

## 2016-10-09 DIAGNOSIS — Z87442 Personal history of urinary calculi: Secondary | ICD-10-CM | POA: Diagnosis not present

## 2016-10-09 DIAGNOSIS — N3010 Interstitial cystitis (chronic) without hematuria: Secondary | ICD-10-CM | POA: Diagnosis not present

## 2016-10-09 DIAGNOSIS — N3941 Urge incontinence: Secondary | ICD-10-CM | POA: Diagnosis not present

## 2016-10-17 ENCOUNTER — Other Ambulatory Visit: Payer: Self-pay

## 2016-11-12 ENCOUNTER — Other Ambulatory Visit: Payer: Self-pay | Admitting: Family Medicine

## 2016-11-26 ENCOUNTER — Ambulatory Visit: Payer: Medicare Other | Admitting: Cardiology

## 2016-12-12 NOTE — Progress Notes (Signed)
HPI The patient presents for followup up of atrial fibrillation and CAD.  She is status post DCCV but had recurrent atrial fib.  She did have some low heart rates. She had her dose of beta blocker reduced and then stopped. She returns for follow up. Since I last saw her she has done relatively well. She does have occasional blood pressures in the 150s 160s at home but otherwise it seems to be well controlled. She did feel her heart rate beating fast not long ago for the morning. She took a nitroglycerin and things resolved. She does notice if her blood pressures elevated and she takes a beta blocker her heart rate and blood pressure will come down. She just celebrated her 89th birthday.  She brought me a red velvet cake today.    Allergies  Allergen Reactions  . Codeine Hives and Nausea And Vomiting  . Sulfonamide Derivatives Swelling    Turns red    Current Outpatient Prescriptions  Medication Sig Dispense Refill  . acetaminophen (TYLENOL) 500 MG tablet Take 500 mg by mouth every 6 (six) hours as needed for mild pain. Reported on 12/12/2015    . apixaban (ELIQUIS) 2.5 MG TABS tablet Take 1 tablet (2.5 mg total) by mouth 2 (two) times daily. 180 tablet 2  . furosemide (LASIX) 40 MG tablet TAKE 1.5 TABLETS (60 MG TOTAL) BY MOUTH DAILY. 60 tablet 5  . isosorbide mononitrate (IMDUR) 60 MG 24 hr tablet TAKE 1 AND 1/2 TABLETS     (=90MG ) DAILY (KREMERS     URBAN MFR) 135 tablet 3  . KLOR-CON 10 10 MEQ tablet TAKE 1 TABLET DAILY 90 tablet 1  . lansoprazole (PREVACID) 30 MG capsule Take 1 capsule (30 mg total) by mouth 2 (two) times daily. 180 capsule 2  . metoprolol tartrate (LOPRESSOR) 25 MG tablet Take 0.5 tablets (12.5 mg total) by mouth as needed. For palpitations 90 tablet 1  . nitroGLYCERIN (NITROSTAT) 0.4 MG SL tablet Place 1 tablet (0.4 mg total) under the tongue every 5 (five) minutes as needed for chest pain. If pain after 3 pills- call 911 20 tablet 2  . ONETOUCH DELICA LANCETS 33G MISC  Use to test blood sugars daily. Dx: E11.9 100 each 11  . ONETOUCH VERIO test strip USE TO TEST BLOOD SUGARS DAILY. DX: E11.9 100 each 3  . pentosan polysulfate (ELMIRON) 100 MG capsule Take 100 mg by mouth 2 (two) times daily.     . pravastatin (PRAVACHOL) 40 MG tablet TAKE 1 TABLET DAILY 90 tablet 3  . SYNTHROID 50 MCG tablet TAKE 1 TABLET DAILY BEFORE BREAKFAST 90 tablet 3  . valsartan (DIOVAN) 320 MG tablet TAKE 1 TABLET DAILY NEEDS  OFFICE VISIT 90 tablet 0   No current facility-administered medications for this visit.     Past Medical History:  Diagnosis Date  . Arthritis   . BARRETT'S ESOPHAGUS, HX OF   . Bladder disorder   . Bradycardia 08/2012   a. Eval for symptomatic bradycardia 08/2012 - beta blocker discontinued.  [name]. BREAST MASS, BENIGN   . CAD (coronary artery disease)    a. s/p PCI to RCA '01. b. repeat PCI to RCA '03 2/2 ISR. c. cath 2012: RCA dz, rx medically.  Occluded RCA.  Nonobstructive disease elsewhere  . Chronic combined systolic and diastolic CHF (congestive heart failure) (HCC)    a. EF 40-45% in 2014. b. EF reported to be normal in 03/2015 nuc.  . CKD (chronic kidney disease), stage  III   . DIVERTICULOSIS, COLON   . Epistaxis   . Esophageal reflux   . History of breast lump/mass excision 05/23/2009   Qualifier: History of  By: Lovell Sheehan MD, Balinda [facility]   . HYPERLIPIDEMIA 03/07/2008  . HYPERTENSION 08/03/2007  . HYPOTHYROIDISM   . Internal hemorrhoids   . Interstitial cystitis   . LBBB (left bundle branch block)   . Orthostatic hypotension 08/2012   a. 08/2012 w/ syncope - meds adjusted.   [name] PAF (paroxysmal atrial fibrillation) (HCC)    a. Dx 09/2013.  . Right hand fracture     Past Surgical History:  Procedure Laterality Date  . ABDOMINAL HYSTERECTOMY    . ANKLE RECONSTRUCTION     Left  . APPENDECTOMY    . BREAST SURGERY     benign mass  . CARDIOVERSION N/A 10/20/2013  . CARDIOVERSION N/A 04/24/2015   Procedure: CARDIOVERSION;  Surgeon: Lars Masson, MD;  Location[facility] ENDOSCOPY;  Service: Cardiovascular;  Laterality: N/A;  . CATARACT EXTRACTION, BILATERAL    . CESAREAN SECTION    . CHOLECYSTECTOMY    . TONSILLECTOMY    . WRIST RECONSTRUCTION     Right    ROS:   As stated in the HPI and negative for all other systems.  PHYSICAL EXAM BP (!) 187/82   Pulse 66   Ht 5\' 3"  (1.6 m)   Wt 162 lb (73.5 kg)   BMI 28.70 kg/m  GENERAL:  Well appearing NECK:  No jugular venous distention, waveform within normal limits, carotid upstroke brisk and symmetric, no bruits, no thyromegaly LUNGS:  Decreased breath sounds in the left lung base with few basilar crackles. BACK:  No CVA tenderness CHEST: Unremarkable HEART:  PMI not displaced or sustained,S1 and S2 within normal limits, no S3, no clicks, no rubs, no murmurs, irregular ABD:  Flat, positive bowel sounds normal in frequency in pitch, no bruits, no rebound, no guarding, no midline pulsatile mass, no hepatomegaly, no splenomegaly EXT:  2 plus pulses throughout, no edema, no cyanosis no clubbing  EKG:    Atrial fibrillation, left bundle branch block, left axis deviation, rate 66.  12/16/2016  Lab Results  Component Value Date   CHOL 129 09/15/2014   TRIG 82.0 09/15/2014   HDL 43.50 09/15/2014   LDLCALC 69 09/15/2014   LDLDIRECT 65.0 06/26/2016     ASSESSMENT AND PLAN  ATRIAL FIB -   Victoria Gomez has a CHA2DS2 - VASc score of 5 with a risk of stroke of 5%.  I will continue her NOAC.  We stopped beta blocker previously because of slow heart rate .  She has been instructed to take when necessary beta blocker in the past with palpitations.  She will continue with this.   CORONARY ARTERY DISEASE -   She had a low risk nuclear study in 2016.  No change in therapy is indicated.   HYPERTENSION -  Her blood pressure fluctuates.  She rarely needs the PRN beta blocker.  No change in therapy is indicated.   Diastolic HF (heart failure) - She seems to be euvolemic. No change in  therapy is indicated.  No further cardiovascular testing is indicated.   CAROTID -   This was mild in 2016.   No follow-up is indicated.  Dyslipidemia -   Her LDL was 65 last year.   She will continue on the meds as listed.

## 2016-12-16 ENCOUNTER — Encounter: Payer: Self-pay | Admitting: Cardiology

## 2016-12-16 ENCOUNTER — Ambulatory Visit (INDEPENDENT_AMBULATORY_CARE_PROVIDER_SITE_OTHER): Payer: Medicare Other | Admitting: Cardiology

## 2016-12-16 VITALS — BP 187/82 | HR 66 | Ht 63.0 in | Wt 162.0 lb

## 2016-12-16 DIAGNOSIS — I4821 Permanent atrial fibrillation: Secondary | ICD-10-CM

## 2016-12-16 DIAGNOSIS — I482 Chronic atrial fibrillation: Secondary | ICD-10-CM

## 2016-12-16 DIAGNOSIS — I447 Left bundle-branch block, unspecified: Secondary | ICD-10-CM

## 2016-12-16 NOTE — Patient Instructions (Signed)

## 2016-12-17 ENCOUNTER — Encounter: Payer: Self-pay | Admitting: Cardiology

## 2016-12-25 ENCOUNTER — Ambulatory Visit (INDEPENDENT_AMBULATORY_CARE_PROVIDER_SITE_OTHER): Payer: Medicare Other | Admitting: Family Medicine

## 2016-12-25 ENCOUNTER — Other Ambulatory Visit: Payer: Self-pay

## 2016-12-25 ENCOUNTER — Encounter: Payer: Self-pay | Admitting: Family Medicine

## 2016-12-25 VITALS — BP 136/86 | HR 78 | Temp 98.5°F | Ht 63.0 in | Wt 160.6 lb

## 2016-12-25 DIAGNOSIS — E119 Type 2 diabetes mellitus without complications: Secondary | ICD-10-CM | POA: Diagnosis not present

## 2016-12-25 DIAGNOSIS — E034 Atrophy of thyroid (acquired): Secondary | ICD-10-CM | POA: Diagnosis not present

## 2016-12-25 DIAGNOSIS — Z0000 Encounter for general adult medical examination without abnormal findings: Secondary | ICD-10-CM | POA: Diagnosis not present

## 2016-12-25 DIAGNOSIS — I10 Essential (primary) hypertension: Secondary | ICD-10-CM

## 2016-12-25 LAB — COMPREHENSIVE METABOLIC PANEL
ALT: 10 U/L (ref 0–35)
AST: 17 U/L (ref 0–37)
Albumin: 4.2 g/dL (ref 3.5–5.2)
Alkaline Phosphatase: 74 U/L (ref 39–117)
BUN: 21 mg/dL (ref 6–23)
CO2: 29 meq/L (ref 19–32)
Calcium: 9.4 mg/dL (ref 8.4–10.5)
Chloride: 106 mEq/L (ref 96–112)
Creatinine, Ser: 0.94 mg/dL (ref 0.40–1.20)
GFR: 59.45 mL/min — ABNORMAL LOW (ref >60.00)
GLUCOSE: 96 mg/dL (ref 70–99)
POTASSIUM: 3.6 meq/L (ref 3.5–5.1)
Sodium: 143 mEq/L (ref 135–145)
Total Bilirubin: 0.4 mg/dL (ref 0.2–1.2)
Total Protein: 7.1 g/dL (ref 6.0–8.3)

## 2016-12-25 LAB — HEMOGLOBIN A1C: HEMOGLOBIN A1C: 6.4 % (ref 4.6–6.5)

## 2016-12-25 LAB — TSH: TSH: 1.03 u[IU]/mL (ref 0.35–4.50)

## 2016-12-25 MED ORDER — POTASSIUM CHLORIDE ER 10 MEQ PO TBCR: 10.0000 meq | EXTENDED_RELEASE_TABLET | Freq: Every day | ORAL | 1 refills | Status: DC

## 2016-12-25 MED ORDER — LEVOTHYROXINE SODIUM 50 MCG PO TABS: 50.0000 ug | ORAL_TABLET | Freq: Every day | ORAL | 3 refills | Status: DC

## 2016-12-25 NOTE — Progress Notes (Signed)
Subjective:  Victoria Gomez is a 83 y.o. year old very pleasant female patient who presents for/with See problem oriented charting ROS- has had some palpitations at night in last few weeks- but metoprolol resolved, had a twinge of chest pain before seeing cardiology but nitroglycerin resolved. No fever, chills, cough, congestion, increasing [place]   Past Medical History-  Patient Active Problem List   Diagnosis Date Noted  . Diabetes mellitus II 09/22/2013    Priority: High  . Atrial fibrillation (HCC) 09/21/2013    Priority: High  . H/O sinus bradycardia 08/10/2012    Priority: High  . Diastolic HF (heart failure) (HCC) 11/17/2011    Priority: High  . Coronary atherosclerosis 08/03/2007    Priority: High  . Osteopenia 06/28/2015    Priority: Medium  . Vitamin B12 deficiency 07/28/2014    Priority: Medium  . CKD (chronic kidney disease), stage III 09/22/2013    Priority: Medium  . LBBB (left bundle branch block) 09/22/2013    Priority: Medium  . Interstitial cystitis     Priority: Medium  . Hyperlipidemia 03/07/2008    Priority: Medium  . Hypothyroidism 08/03/2007    Priority: Medium  . Essential hypertension 08/03/2007    Priority: Medium  . Internal hemorrhoids     Priority: Low  . Orthostatic hypotension 08/10/2012    Priority: Low  . Fatigue 04/20/2012    Priority: Low  . ARTHRITIS 12/30/2010    Priority: Low  . Left lumbar radiculopathy 12/11/2010    Priority: Low  . Esophageal reflux 10/18/2007    Priority: Low  . BARRETT'S ESOPHAGUS, HX OF 08/03/2007    Priority: Low    Medications- reviewed and updated Current Outpatient Prescriptions  Medication Sig Dispense Refill  . acetaminophen (TYLENOL) 500 MG tablet Take 500 mg by mouth every 6 (six) hours as needed for mild pain. Reported on 12/12/2015    . apixaban (ELIQUIS) 2.5 MG TABS tablet Take 1 tablet (2.5 mg total) by mouth 2 (two) times daily. 180 tablet 2  . furosemide (LASIX) 40 MG tablet TAKE 1.5 TABLETS  (60 MG TOTAL) BY MOUTH DAILY. 60 tablet 5  . isosorbide mononitrate (IMDUR) 60 MG 24 hr tablet TAKE 1 AND 1/2 TABLETS     (=90MG ) DAILY (KREMERS     URBAN MFR) 135 tablet 3  . lansoprazole (PREVACID) 30 MG capsule Take 1 capsule (30 mg total) by mouth 2 (two) times daily. 180 capsule 2  . metoprolol tartrate (LOPRESSOR) 25 MG tablet Take 0.5 tablets (12.5 mg total) by mouth as needed. For palpitations 90 tablet 1  . nitroGLYCERIN (NITROSTAT) 0.4 MG SL tablet Place 1 tablet (0.4 mg total) under the tongue every 5 (five) minutes as needed for chest pain. If pain after 3 pills- call 911 20 tablet 2  . ONETOUCH DELICA LANCETS 33G MISC Use to test blood sugars daily. Dx: E11.9 100 each 11  . ONETOUCH VERIO test strip USE TO TEST BLOOD SUGARS DAILY. DX: E11.9 100 each 3  . pentosan polysulfate (ELMIRON) 100 MG capsule Take 100 mg by mouth 2 (two) times daily.     . pravastatin (PRAVACHOL) 40 MG tablet TAKE 1 TABLET DAILY 90 tablet 3  . valsartan (DIOVAN) 320 MG tablet TAKE 1 TABLET DAILY NEEDS  OFFICE VISIT 90 tablet 0  . levothyroxine (SYNTHROID) 50 MCG tablet Take 1 tablet (50 mcg total) by mouth daily before breakfast. 90 tablet 3  . potassium chloride (KLOR-CON 10) 10 MEQ tablet Take 1 tablet (10 mEq  total) by mouth daily. 90 tablet 1   No current facility-administered medications for this visit.     Objective: BP 136/86   Pulse 78   Temp 98.5 F (36.9 C) (Oral)   Ht 5\' 3"  (1.6 m)   Wt 160 lb 9.6 oz (72.8 kg)   SpO2 96%   BMI 28.45 kg/m  Gen: NAD, resting comfortably CV: irregularly irregular no murmurs rubs or gallops Lungs: CTAB no crackles, wheeze, rhonchi Abdomen: soft/nontender/nondistended/normal bowel sounds. No rebound or guarding.  Ext: trace edema  Skin: warm, dry  Assessment/Plan:  Diabetes mellitus II S: controlled with no meds. Sugars at home in 80-118 range fasting.  Lab Results  Component Value Date   HGBA1C 6.3 06/26/2016   HGBA1C 6.5 12/12/2015   HGBA1C 6.2  06/28/2015   A/P: stop by lab for a1c after visit with Montine CircleSusan Hauck, RN for AWV  Hypothyroidism S: controlled on levothyroxine 50mcg  A/P: continue current medications, update tsh  Essential hypertension S: controlled on valsartan 320mg  and lasix 60mg  on last visit. Was up at cardiology. .  BP Readings from Last 3 Encounters:  12/25/16 136/86  12/16/16 (!) 187/82  06/26/16 116/78  A/P:Continue current meds:  BP looks great on repeat today- initial mild elevation at 140   Orders Placed This Encounter  Procedures  . Hemoglobin A1c    [place]  . Comprehensive metabolic panel    [place]  . TSH    [place]   Also for awv today  Return precautions advised.  Tana ConchStephen Hunter, MD

## 2016-12-25 NOTE — Progress Notes (Signed)
Pre visit review using our clinic review tool, if applicable. No additional management support is needed unless otherwise documented below in the visit note. 

## 2016-12-25 NOTE — Assessment & Plan Note (Signed)
S: controlled on levothyroxine  A/P: continue current medications, update tsh

## 2016-12-25 NOTE — Patient Instructions (Addendum)
I am going to walk you down to see Victoria Gomez  After you see her, Please stop by lab before you go  No changes in medicine before you go   Victoria Gomez , Thank you for taking time to come for your Medicare Wellness Visit. I appreciate your ongoing commitment to your health goals. Please review the following plan we discussed and let me know if I can assist you in the future.   Want to drive again and negotiate with son   Try to eat 1216m of Calcium in your diet per day;  Dairy products; yogurt, cheese,  Be safe on your stationary bike;   Will review Advanced Directives to see if you have completed     [employer] [phone]. Victoria Gomez [name]  [place]: [phone]   [facility] Hearing [facility] Services - can assist with hearing aid x 1  No reviews  [facility] [address]  [phone]   These are the goals we discussed: Goals    . Exercise 150 minutes per week (moderate activity)          Has a stationary bike; will drag it out and use it.  Start low and go slow; Build a few minutes at a time       This is a list of the screening recommended for you and due dates:  Health Maintenance  Topic Date Due  . Eye exam for diabetics  03/07/2016  . Hemoglobin A1C  12/27/2016  . Complete foot exam   06/26/2017  . Tetanus Vaccine  03/08/2022  . Flu Shot  Completed  . DEXA scan (bone density measurement)  Completed  . Shingles Vaccine  Completed  . Pneumonia vaccines  Completed   Health Maintenance, Female Introduction Adopting a healthy lifestyle and getting preventive care can go a long way to promote health and wellness. Talk with your health care provider about what schedule of regular examinations is right for you. This is a good chance for you to check in with your  provider about disease prevention and staying healthy. In between checkups, there are plenty of things you can do on your own. Experts have done a lot of research about which lifestyle changes and preventive measures are most likely to keep you healthy. Ask your health care provider for more information. Weight and diet Eat a healthy diet  Be sure to include plenty of vegetables, fruits, low-fat dairy products, and lean protein.  Do not eat a lot of foods high in solid fats, added sugars, or salt.  Get regular exercise. This is one of the most important things you can do for your health.  Most adults should exercise for at least 150 minutes each week. The exercise should increase your heart rate and make you sweat (moderate-intensity exercise).  Most adults should also do strengthening exercises at least twice a week. This is in addition to the moderate-intensity exercise. Maintain a healthy weight  Body mass index (BMI) is a measurement that can be used to identify possible weight problems. It estimates body fat based on height and weight. Your health care provider can help determine your BMI and help you achieve or maintain a healthy weight.  For females 266years of age and older:  A BMI below  18.5 is considered underweight.  A BMI of 18.5 to 24.9 is normal.  A BMI of 25 to 29.9 is considered overweight.  A BMI of 30 and above is considered obese. Watch levels of cholesterol and blood lipids  You should start having your blood tested for lipids and cholesterol at 81 years of age, then have this test every 5 years.  You may need to have your cholesterol levels checked more often if:  Your lipid or cholesterol levels are high.  You are older than 81 years of age.  You are at high risk for heart disease. Cancer screening Lung Cancer  Lung cancer screening is recommended for adults 30-41 years old who are at high risk for lung cancer because of a history of smoking.  A yearly  low-dose CT scan of the lungs is recommended for people who:  Currently smoke.  Have quit within the past 15 years.  Have at least a 30-pack-year history of smoking. A pack year is smoking an average of one pack of cigarettes a day for 1 year.  Yearly screening should continue until it has been 15 years since you quit.  Yearly screening should stop if you develop a health problem that would prevent you from having lung cancer treatment. Breast Cancer  Practice breast self-awareness. This means understanding how your breasts normally appear and feel.  It also means doing regular breast self-exams. Let your health care provider know about any changes, no matter how small.  If you are in your 20s or 30s, you should have a clinical breast exam (CBE) by a health care provider every 1-3 years as part of a regular health exam.  If you are 106 or older, have a CBE every year. Also consider having a breast X-ray (mammogram) every year.  If you have a family history of breast cancer, talk to your health care provider about genetic screening.  If you are at high risk for breast cancer, talk to your health care provider about having an MRI and a mammogram every year.  Breast cancer gene (BRCA) assessment is recommended for women who have family members with BRCA-related cancers. BRCA-related cancers include:  Breast.  Ovarian.  Tubal.  Peritoneal cancers.  Results of the assessment will determine the need for genetic counseling and BRCA1 and BRCA2 testing. Cervical Cancer  Your health care provider may recommend that you be screened regularly for cancer of the pelvic organs (ovaries, uterus, and vagina). This screening involves a pelvic examination, including checking for microscopic changes to the surface of your cervix (Pap test). You may be encouraged to have this screening done every 3 years, beginning at age 49.  For women ages 67-65, health care providers may recommend pelvic exams  and Pap testing every 3 years, or they may recommend the Pap and pelvic exam, combined with testing for human papilloma virus (HPV), every 5 years. Some types of HPV increase your risk of cervical cancer. Testing for HPV may also be done on women of any age with unclear Pap test results.  Other health care providers may not recommend any screening for nonpregnant women who are considered low risk for pelvic cancer and who do not have symptoms. Ask your health care provider if a screening pelvic exam is right for you.  If you have had past treatment for cervical cancer or a condition that could lead to cancer, you need Pap tests and screening for cancer for at least 20 years after your treatment. If Pap  tests have been discontinued, your risk factors (such as having a new sexual partner) need to be reassessed to determine if screening should resume. Some women have medical problems that increase the chance of getting cervical cancer. In these cases, your health care provider may recommend more frequent screening and Pap tests. Colorectal Cancer  This type of cancer can be detected and often prevented.  Routine colorectal cancer screening usually begins at 81 years of age and continues through 81 years of age.  Your health care provider may recommend screening at an earlier age if you have risk factors for colon cancer.  Your health care provider may also recommend using home test kits to check for hidden blood in the stool.  A small camera at the end of a tube can be used to examine your colon directly (sigmoidoscopy or colonoscopy). This is done to check for the earliest forms of colorectal cancer.  Routine screening usually begins at age 63.  Direct examination of the colon should be repeated every 5-10 years through 81 years of age. However, you may need to be screened more often if early forms of precancerous polyps or small growths are found. Skin Cancer  Check your skin from head to toe  regularly.  Tell your health care provider about any new moles or changes in moles, especially if there is a change in a mole's shape or color.  Also tell your health care provider if you have a mole that is larger than the size of a pencil eraser.  Always use sunscreen. Apply sunscreen liberally and repeatedly throughout the day.  Protect yourself by wearing long sleeves, pants, a wide-brimmed hat, and sunglasses whenever you are outside. Heart disease, diabetes, and high blood pressure  High blood pressure causes heart disease and increases the risk of stroke. High blood pressure is more likely to develop in:  People who have blood pressure in the high end of the normal range (130-139/85-89 mm Hg).  People who are overweight or obese.  People who are African American.  If you are 32-65 years of age, have your blood pressure checked every 3-5 years. If you are 34 years of age or older, have your blood pressure checked every year. You should have your blood pressure measured twice-once when you are at a hospital or clinic, and once when you are not at a hospital or clinic. Record the average of the two measurements. To check your blood pressure when you are not at a hospital or clinic, you can use:  An automated blood pressure machine at a pharmacy.  A home blood pressure monitor.  If you are between 31 years and 2 years old, ask your health care provider if you should take aspirin to prevent strokes.  Have regular diabetes screenings. This involves taking a blood sample to check your fasting blood sugar level.  If you are at a normal weight and have a low risk for diabetes, have this test once every three years after 81 years of age.  If you are overweight and have a high risk for diabetes, consider being tested at a younger age or more often. Preventing infection Hepatitis B  If you have a higher risk for hepatitis B, you should be screened for this virus. You are considered at  high risk for hepatitis B if:  You were born in a country where hepatitis B is common. Ask your health care provider which countries are considered high risk.  Your parents were born in  a high-risk country, and you have not been immunized against hepatitis B (hepatitis B vaccine).  You have HIV or AIDS.  You use needles to inject street drugs.  You live with someone who has hepatitis B.  You have had sex with someone who has hepatitis B.  You get hemodialysis treatment.  You take certain medicines for conditions, including cancer, organ transplantation, and autoimmune conditions. Hepatitis C  Blood testing is recommended for:  Everyone born from [birth year] through 1963.  Anyone with known risk factors for hepatitis C. Sexually transmitted infections (STIs)  You should be screened for sexually transmitted infections (STIs) including gonorrhea and chlamydia if:  You are sexually active and are younger than 81 years of age.  You are older than 81 years of age and your health care provider tells you that you are at risk for this type of infection.  Your sexual activity has changed since you were last screened and you are at an increased risk for chlamydia or gonorrhea. Ask your health care provider if you are at risk.  If you do not have HIV, but are at risk, it may be recommended that you take a prescription medicine daily to prevent HIV infection. This is called pre-exposure prophylaxis (PrEP). You are considered at risk if:  You are sexually active and do not regularly use condoms or know the HIV status of your partner(s).  You take drugs by injection.  You are sexually active with a partner who has HIV. Talk with your health care provider about whether you are at high risk of being infected with HIV. If you choose to begin PrEP, you should first be tested for HIV. You should then be tested every 3 months for as long as you are taking PrEP. Pregnancy  If you are premenopausal and  you may become pregnant, ask your health care provider about preconception counseling.  If you may become pregnant, take 400 to 800 micrograms (mcg) of folic acid every day.  If you want to prevent pregnancy, talk to your health care provider about birth control (contraception). Osteoporosis and menopause  Osteoporosis is a disease in which the bones lose minerals and strength with aging. This can result in serious bone fractures. Your risk for osteoporosis can be identified using a bone density scan.  If you are 31 years of age or older, or if you are at risk for osteoporosis and fractures, ask your health care provider if you should be screened.  Ask your health care provider whether you should take a calcium or vitamin D supplement to lower your risk for osteoporosis.  Menopause may have certain physical symptoms and risks.  Hormone replacement therapy may reduce some of these symptoms and risks. Talk to your health care provider about whether hormone replacement therapy is right for you. Follow these instructions at home:  Schedule regular health, dental, and eye exams.  Stay current with your immunizations.  Do not use any tobacco products including cigarettes, chewing tobacco, or electronic cigarettes.  If you are pregnant, do not drink alcohol.  If you are breastfeeding, limit how much and how often you drink alcohol.  Limit alcohol intake to no more than 1 drink per day for nonpregnant women. One drink equals 12 ounces of beer, 5 ounces of wine, or 1 ounces of hard liquor.  Do not use street drugs.  Do not share needles.  Ask your health care provider for help if you need support or information about quitting drugs.  Tell  your health care provider if you often feel depressed.  Tell your health care provider if you have ever been abused or do not feel safe at home. This information is not intended to replace advice given to you by your health care provider. Make sure  you discuss any questions you have with your health care provider. Document Released: 05/12/2011 Document Revised: 04/03/2016 Document Reviewed: 07/31/2015  2017 Elsevier   Fall Prevention in the Home Falls can cause injuries and can affect people from all age groups. There are many simple things that you can do to make your home safe and to help prevent falls. What can I do on the outside of my home?  Regularly repair the edges of walkways and driveways and fix any cracks.  Remove high doorway thresholds.  Trim any shrubbery on the main path into your home.  Use bright outdoor lighting.  Clear walkways of debris and clutter, including tools and rocks.  Regularly check that handrails are securely fastened and in good repair. Both sides of any steps should have handrails.  Install guardrails along the edges of any raised decks or porches.  Have leaves, snow, and ice cleared regularly.  Use sand or salt on walkways during winter months.  In the garage, clean up any spills right away, including grease or oil spills. What can I do in the bathroom?  Use night lights.  Install grab bars by the toilet and in the tub and shower. Do not use towel bars as grab bars.  Use non-skid mats or decals on the floor of the tub or shower.  If you need to sit down while you are in the shower, use a plastic, non-slip stool.  Keep the floor dry. Immediately clean up any water that spills on the floor.  Remove soap buildup in the tub or shower on a regular basis.  Attach bath mats securely with double-sided non-slip rug tape.  Remove throw rugs and other tripping hazards from the floor. What can I do in the bedroom?  Use night lights.  Make sure that a bedside light is easy to reach.  Do not use oversized bedding that drapes onto the floor.  Have a firm chair that has side arms to use for getting dressed.  Remove throw rugs and other tripping hazards from the floor. What can I do in  the kitchen?  Clean up any spills right away.  Avoid walking on wet floors.  Place frequently used items in easy-to-reach places.  If you need to reach for something above you, use a sturdy step stool that has a grab bar.  Keep electrical cables out of the way.  Do not use floor polish or wax that makes floors slippery. If you have to use wax, make sure that it is non-skid floor wax.  Remove throw rugs and other tripping hazards from the floor. What can I do in the stairways?  Do not leave any items on the stairs.  Make sure that there are handrails on both sides of the stairs. Fix handrails that are broken or loose. Make sure that handrails are as long as the stairways.  Check any carpeting to make sure that it is firmly attached to the stairs. Fix any carpet that is loose or worn.  Avoid having throw rugs at the top or bottom of stairways, or secure the rugs with carpet tape to prevent them from moving.  Make sure that you have a light switch at the top of the  stairs and the bottom of the stairs. If you do not have them, have them installed. What are some other fall prevention tips?  Wear closed-toe shoes that fit well and support your feet. Wear shoes that have rubber soles or low heels.  When you use a stepladder, make sure that it is completely opened and that the sides are firmly locked. Have someone hold the ladder while you are using it. Do not climb a closed stepladder.  Add color or contrast paint or tape to grab bars and handrails in your home. Place contrasting color strips on the first and last steps.  Use mobility aids as needed, such as canes, walkers, scooters, and crutches.  Turn on lights if it is dark. Replace any light bulbs that burn out.  Set up furniture so that there are clear paths. Keep the furniture in the same spot.  Fix any uneven floor surfaces.  Choose a carpet design that does not hide the edge of steps of a stairway.  Be aware of any and  all pets.  Review your medicines with your healthcare provider. Some medicines can cause dizziness or changes in blood pressure, which increase your risk of falling. Talk with your health care provider about other ways that you can decrease your risk of falls. This may include working with a physical therapist or trainer to improve your strength, balance, and endurance. This information is not intended to replace advice given to you by your health care provider. Make sure you discuss any questions you have with your health care provider. Document Released: 10/17/2002 Document Revised: 03/25/2016 Document Reviewed: 12/01/2014 Elsevier Interactive Patient Education  2017 [employer].

## 2016-12-25 NOTE — Assessment & Plan Note (Signed)
S: controlled on valsartan 320mg  and lasix 60mg  on last visit. Was up at cardiology. .  BP Readings from Last 3 Encounters:  12/25/16 136/86  12/16/16 (!) 187/82  06/26/16 116/78  A/P:Continue current meds:  BP looks great on repeat today- initial mild elevation at 140

## 2016-12-25 NOTE — Assessment & Plan Note (Signed)
S: controlled with no meds. Sugars at home in 80-118 range fasting.  Lab Results  Component Value Date   HGBA1C 6.3 06/26/2016   HGBA1C 6.5 12/12/2015   HGBA1C 6.2 06/28/2015   A/P: stop by lab for a1c after visit with Montine CircleSusan Hauck, RN for AWV

## 2016-12-25 NOTE — Progress Notes (Signed)
Subjective:   Victoria Gomez is a 83 y.o. female who presents for Medicare Annual (Subsequent) preventive examination.   Cardiac Risk Factors include: advanced age (>35men, >79 women);diabetes mellitus;family history of premature cardiovascular disease HRA assessment completed during this visit with   The Patient was informed that the wellness visit is to identify future health risk and educate and initiate measures that can reduce risk for increased disease through the lifespan.    NO ROS; Medicare Wellness Visit Last OV:  02/06 cardiology; 06/2016  Labs completed: 08/17 Apt with Dr. Durene Cal today   Lives with son who has lymphoma in your home 2 other children; ken lives in [place] 2 grandsons  Spouse died x  3 years    Describes health as fair, good or great? Victoria Gomez Looks much younger than her stated age. Very involved. States she does almost all the housework   Social History   Social History  . Marital status: Widowed    Spouse name: N/A  . Number of children: N/A  . Years of education: N/A   Occupational History  . retired    Social History Main Topics  . Smoking status: Former Smoker    Packs/day: 1.00    Years: 15.00    Types: Cigarettes    Quit date: 11/10/1969  . Smokeless tobacco: Never Used  . Alcohol use No  . Drug use: No  . Sexual activity: No   Other Topics Concern  . Not on file   Social History Narrative   Widowed 05/2013. 2nd marriage. First husband died of cancer. 3 sons-Victoria Gomez- 2 sons, 2 twin girls, Victoria Gomez-1 son, no grandsons. Victoria Gomez-1 son, 1 daughter, no grandkids.       LIves with youngest son (has cancer). Son drives her but has license. Independent IADLs.       Retired from [employer] after 33 years.       Hobbies: crocheting, used to bowl with husband, reading      Living will: currently wants resuscitation.      Update:  Tobacco: no or smoking cessation discussed: no   How many drinks do you have  per week? none Medications told well States cardiologist told her she is doing well   BMI: 28.8 Diet;  A1c 8/17 6.3  LDL 65  Eats bacon; boiled eggs Sandwich for lunch Meal for supper Eats a lot of vegetables Drinks V8 Waches salt and sugar Canned fruit no sugar added; makes a fruit salad   Exercise;  she used to walk every day with her spouse Walks in the neighborhood in the summer Has a long hallway; and a few steps to go down Stays very busy;   HOME SAFETY;  Fall hx; tripped over puppies cage in kitchen and did not get hurt Hx of screws and plate in left ankle; 2 dogs cut in front of her. Now starts to hurt. Fall risk: discussed being careful with small animals   Gait: get and go is good  Given education on "Fall Prevention in the Home" for more safety tips the patient can apply as appropriate.  Long term goal is to "age in place" or undecided    Mental Health:  Any emotional problems? Anxious, depressed, irritable, sad or blue?  no Denies feeling depressed or hopeless; voices pleasure in daily life no How many social activities have you been engaged in within the last 2 weeks? Who would help you with chores; illness; shopping other? Doesn't  need any help    Cognitive;  Manages checkbook, medications; no failures of task Ad8 score reviewed for issues;  Issues making decisions; no  Less interest in hobbies / activities" no  Repeats questions, stories; family complaining: NO  Trouble using ordinary gadgets; microwave; computer: pays bills online; get on facebook and looks at pictures of grandchildren  Does emails from church and calls people to pray for them   Forgets the month or year: no  Mismanaging finances: no  Missing apt: no but does write them down  Daily problems with thinking of memory NO Ad8 score is 0   Any dizziness when standing up? None witnessed today     Hearing Screening   125Hz  250Hz  500Hz  1000Hz  2000Hz  3000Hz  4000Hz  6000Hz  8000Hz     Right ear:     100      Left ear:     100      Comments: Does not have hearing aids   Vision Screening Comments: Vision; once a year States she has area of concern on right eye Dr. Shirlyn Goltz    Ophthalmology exam DUE and will schedule eye exam/ was canceled she son had her car  Dr. Dr. Shirlyn Goltz   Advanced Directive addressed; Completed or educated   Mammogram: 07/2014; will not repeat  Dexa/ 12/2015 -2.8/ just takes Vit D No medicine  PAP: educated regarding the need for GYN exam;   Immunizations Due: (Vaccines reviewed and educated regarding any overdue)      Objective:     Vitals: BP 136/86   Pulse 78   Temp 98.5 F (36.9 C) (Oral)   Ht 5\' 3"  (1.6 m)   Wt 160 lb 9.6 oz (72.8 kg)   SpO2 96%   BMI 28.45 kg/m   Body mass index is 28.45 kg/m.   Tobacco History  Smoking Status  . Former Smoker  . Packs/day: 1.00  . Years: 15.00  . Types: Cigarettes  . Quit date: 11/10/1969  Smokeless Tobacco  . Never Used     Counseling given: Yes   Past Medical History:  Diagnosis Date  . Arthritis   . BARRETT'S ESOPHAGUS, HX OF   . Bladder disorder   . Bradycardia 08/2012   a. Eval for symptomatic bradycardia 08/2012 - beta blocker discontinued.  [name] BREAST MASS, BENIGN   . CAD (coronary artery disease)    a. s/p PCI to RCA '01. b. repeat PCI to RCA '03 2/2 ISR. c. cath 2012: RCA dz, rx medically.  Occluded RCA.  Nonobstructive disease elsewhere  . Chronic combined systolic and diastolic CHF (congestive heart failure) (HCC)    a. EF 40-45% in 2014. b. EF reported to be normal in 03/2015 nuc.  . CKD (chronic kidney disease), stage III   . DIVERTICULOSIS, COLON   . Epistaxis   . Esophageal reflux   . History of breast lump/mass excision 05/23/2009   Qualifier: History of  By: Lovell Sheehan MD, Balinda Quails   . HYPERLIPIDEMIA 03/07/2008  . HYPERTENSION 08/03/2007  . HYPOTHYROIDISM   . Internal hemorrhoids   . Interstitial cystitis   . LBBB (left bundle branch block)   . Orthostatic  hypotension 08/2012   a. 08/2012 w/ syncope - meds adjusted.   [name] PAF (paroxysmal atrial fibrillation) (HCC)    a. Dx 09/2013.  . Right hand fracture    Past Surgical History:  Procedure Laterality Date  . ABDOMINAL HYSTERECTOMY    . ANKLE RECONSTRUCTION     Left  . APPENDECTOMY    .  BREAST SURGERY     benign mass  . CARDIOVERSION N/A 10/20/2013  . CARDIOVERSION N/A 04/24/2015   Procedure: CARDIOVERSION;  Surgeon: Lars Masson, MD;  Location: [facility] ENDOSCOPY;  Service: Cardiovascular;  Laterality: N/A;  . CATARACT EXTRACTION, BILATERAL    . CESAREAN SECTION    . CHOLECYSTECTOMY    . TONSILLECTOMY    . WRIST RECONSTRUCTION     Right   Family History  Problem Relation Age of Onset  . Colon cancer Mother   . Coronary artery disease Mother   . Heart disease Mother   . Coronary artery disease Father   . Heart disease Father   . Colon polyps Sister   . Coronary artery disease Sister   . Diabetes type II Son   . Coronary artery disease Brother    History  Sexual Activity  . Sexual activity: No    Outpatient Encounter Prescriptions as of 12/25/2016  Medication Sig  . acetaminophen (TYLENOL) 500 MG tablet Take 500 mg by mouth every 6 (six) hours as needed for mild pain. Reported on 12/12/2015  . apixaban (ELIQUIS) 2.5 MG TABS tablet Take 1 tablet (2.5 mg total) by mouth 2 (two) times daily.  . furosemide (LASIX) 40 MG tablet TAKE 1.5 TABLETS (60 MG TOTAL) BY MOUTH DAILY.  . isosorbide mononitrate (IMDUR) 60 MG 24 hr tablet TAKE 1 AND 1/2 TABLETS     (=90MG ) DAILY (KREMERS     URBAN MFR)  . lansoprazole (PREVACID) 30 MG capsule Take 1 capsule (30 mg total) by mouth 2 (two) times daily.  . metoprolol tartrate (LOPRESSOR) 25 MG tablet Take 0.5 tablets (12.5 mg total) by mouth as needed. For palpitations  . nitroGLYCERIN (NITROSTAT) 0.4 MG SL tablet Place 1 tablet (0.4 mg total) under the tongue every 5 (five) minutes as needed for chest pain. If pain after 3 pills- call 911  .  ONETOUCH DELICA LANCETS 33G MISC Use to test blood sugars daily. Dx: E11.9  . ONETOUCH VERIO test strip USE TO TEST BLOOD SUGARS DAILY. DX: E11.9  . pentosan polysulfate (ELMIRON) 100 MG capsule Take 100 mg by mouth 2 (two) times daily.   . pravastatin (PRAVACHOL) 40 MG tablet TAKE 1 TABLET DAILY  . valsartan (DIOVAN) 320 MG tablet TAKE 1 TABLET DAILY NEEDS  OFFICE VISIT  . [DISCONTINUED] KLOR-CON 10 10 MEQ tablet TAKE 1 TABLET DAILY  . [DISCONTINUED] SYNTHROID 50 MCG tablet TAKE 1 TABLET DAILY BEFORE BREAKFAST   No facility-administered encounter medications on file as of 12/25/2016.     Activities of Daily Living In your present state of health, do you have any difficulty performing the following activities: 12/25/2016  Hearing? (No Data)  Vision? Y  Difficulty concentrating or making decisions? N  Walking or climbing stairs? N  Dressing or bathing? N  Doing errands, shopping? N  Preparing Food and eating ? N  Using the Toilet? N  In the past six months, have you accidently leaked urine? N  Do you have problems with loss of bowel control? N  Managing your Medications? N  Managing your Finances? N  Housekeeping or managing your Housekeeping? N  Some recent data might be hidden    Patient Care Team: Shelva Majestic, MD as PCP - General (Family Medicine)    Assessment:   Exercise Activities and Dietary recommendations Current Exercise Habits: Home exercise routine, Type of exercise: walking, Intensity: Mild  Goals    . Exercise 150 minutes per week (moderate activity)  Has a stationary bike; will drag it out and use it.  Start low and go slow; Build a few minutes at a time      Fall Risk Fall Risk  12/25/2016 10/17/2016 06/28/2015 04/21/2014 11/18/2013  Falls in the past year? Yes No No No No  Number falls in past yr: 1 - - - -  Injury with Fall? No - - - -   Depression Screen PHQ 2/9 Scores 12/25/2016 12/25/2016 06/28/2015 04/21/2014  PHQ - 2 Score 0 0 0 1      Cognitive Function MMSE - Mini Mental State Exam 12/25/2016  Not completed: (No Data)  no issues noted with memroy       Immunization History  Administered Date(s) Administered  . Influenza Split 08/11/2011  . Influenza Whole 09/09/2007, 08/16/2008, 08/12/2010  . Influenza, High Dose Seasonal PF 08/28/2016  . Influenza,inj,Quad PF,36+ Mos 07/28/2014  . Influenza-Unspecified 08/16/2012, 09/10/2013, 09/13/2015  . Pneumococcal Conjugate-13 02/23/2015  . Pneumococcal Polysaccharide-23 09/09/2007  . Tdap 03/08/2012  . Zoster 08/12/2010   Screening Tests Health Maintenance  Topic Date Due  . OPHTHALMOLOGY EXAM  03/07/2016  . HEMOGLOBIN A1C  12/27/2016  . FOOT EXAM  06/26/2017  . TETANUS/TDAP  03/08/2022  . INFLUENZA VACCINE  Completed  . DEXA SCAN  Completed  . ZOSTAVAX  Completed  . PNA vac Low Risk Adult  Completed      Plan:    PCP Notes  Health Maintenance Will reschedule her eye exam Issues for delay are related to her son feeling she should not drive anymore. Given information regarding from the [facility] regarding signs that you may not need to drive as well as how to discuss the issue of driving with family. Also educated on the red flags that would indicate it is time to give up driving  Also discussed calling [employer] to see if there is a safe senior driving course she can take.   Abnormal Screens  Dexa; drinking mild; yogurt Plans to pull out her stationary bike and get more exercise   Will take home copy of [place] Advanced directives for review   Referrals discussed hearing referral but not interested at this time   Patient concerns; Has support from church family   Nurse Concerns;  Keeping her independence   Next PCP apt   During the course of the visit the patient was educated and counseled about the following appropriate screening and preventive services:   Vaccines to include Pneumoccal, Influenza, Hepatitis B, Td,  Zostavax, HCV  Electrocardiogram  Cardiovascular Disease  Colorectal cancer screening  Bone density screening  Diabetes screening  Glaucoma screening  Mammography/PAP  Nutrition counseling   Patient Instructions (the written plan) was given to the patient.   Calyssa Zobrist, RN  12/25/2016   I have reviewed and agree with note, evaluation, plan. Also see my separate note.   Tana ConchStephen Hunter, MD

## 2016-12-29 ENCOUNTER — Other Ambulatory Visit: Payer: Self-pay | Admitting: Cardiology

## 2016-12-31 ENCOUNTER — Other Ambulatory Visit: Payer: Self-pay | Admitting: Family Medicine

## 2017-02-05 DIAGNOSIS — H35371 Puckering of macula, right eye: Secondary | ICD-10-CM | POA: Diagnosis not present

## 2017-02-05 DIAGNOSIS — E119 Type 2 diabetes mellitus without complications: Secondary | ICD-10-CM | POA: Diagnosis not present

## 2017-02-05 DIAGNOSIS — H35033 Hypertensive retinopathy, bilateral: Secondary | ICD-10-CM | POA: Diagnosis not present

## 2017-02-05 DIAGNOSIS — H1013 Acute atopic conjunctivitis, bilateral: Secondary | ICD-10-CM | POA: Diagnosis not present

## 2017-02-05 LAB — HM DIABETES EYE EXAM

## 2017-02-09 ENCOUNTER — Encounter: Payer: Self-pay | Admitting: Family Medicine

## 2017-02-10 ENCOUNTER — Encounter: Payer: Self-pay | Admitting: Family Medicine

## 2017-04-02 ENCOUNTER — Other Ambulatory Visit: Payer: Self-pay | Admitting: Cardiology

## 2017-04-03 NOTE — Telephone Encounter (Signed)
REFILL 

## 2017-04-15 ENCOUNTER — Other Ambulatory Visit: Payer: Self-pay | Admitting: Cardiology

## 2017-04-15 ENCOUNTER — Other Ambulatory Visit: Payer: Self-pay | Admitting: Family Medicine

## 2017-04-15 MED ORDER — APIXABAN 2.5 MG PO TABS: 2.5000 mg | ORAL_TABLET | Freq: Two times a day (BID) | ORAL | 1 refills | Status: DC

## 2017-04-15 NOTE — Telephone Encounter (Signed)
New Message      *STAT* If patient is at the pharmacy, call can be transferred to refill team.   1. Which medications need to be refilled? (please list name of each medication and dose if known)  Eliquis 2.5mg   2. Which pharmacy/location (including street and city if local pharmacy) is medication to be sent to? [phone]  3. Do they need a 30 day or 90 day supply? 90

## 2017-04-16 ENCOUNTER — Telehealth: Payer: Self-pay | Admitting: Cardiology

## 2017-04-16 ENCOUNTER — Encounter: Payer: Self-pay | Admitting: *Deleted

## 2017-04-16 MED ORDER — APIXABAN 2.5 MG PO TABS: 2.5000 mg | ORAL_TABLET | Freq: Two times a day (BID) | ORAL | 1 refills | Status: DC

## 2017-04-16 NOTE — Telephone Encounter (Signed)
New message     *STAT* If patient is at the pharmacy, call can be transferred to refill team.   1. Which medications need to be refilled? (please list name of each medication and dose if known) Eliquis 2.5 mg  2. Which pharmacy/location (including street and city if local pharmacy) is medication to be sent to?[facility] [phone]  3. Do they need a 30 day or 90 day supply? 90 day

## 2017-05-25 ENCOUNTER — Other Ambulatory Visit: Payer: Self-pay | Admitting: Family Medicine

## 2017-06-02 ENCOUNTER — Telehealth: Payer: Self-pay | Admitting: Cardiology

## 2017-06-02 MED ORDER — LOSARTAN POTASSIUM 100 MG PO TABS: 100.0000 mg | ORAL_TABLET | Freq: Every day | ORAL | 3 refills | Status: DC

## 2017-06-02 NOTE — Telephone Encounter (Signed)
Start losartan100mg  as directed by [facility] current memo. Patient has home BP cuff, will take BP at home every 2-3 days for 2 weeks to ensure no significant changes in BP. Pt  will call with any BP changes -or- s/e for further direction. Refill sent to pharmacy as requested.

## 2017-06-02 NOTE — Telephone Encounter (Signed)
Pt c/o medication issue:  1. Name of Medication: Valsartan 320 MG  2. How are you currently taking this medication (dosage and times per day)? 1x a day  3. Are you having a reaction (difficulty breathing--STAT)? no  4. What is your medication issue? Being recalled  Patient received letter from pharmacy about recall

## 2017-06-04 ENCOUNTER — Telehealth: Payer: Self-pay | Admitting: Cardiology

## 2017-06-04 NOTE — Telephone Encounter (Signed)
New Message     Pt c/o medication issue:  1. Name of Medication:  losartan (COZAAR) 100 MG tablet Take 1 tablet (100 mg total) by mouth daily.     2. How are you currently taking this medication (dosage and times per day)? 1 time a day   3. Are you having a reaction (difficulty breathing--STAT)?  Dizziness , sore throat and headache 4. What is your medication issue?  Will these symptoms go away as she gets use to the medication ?

## 2017-06-04 NOTE — Telephone Encounter (Signed)
Returned the call to the patient. Patient recently started Losartan on 7/25 due to the Valsartan recall. She stated that yesterday she was feeling dizzy, had a sore throat and a headache. Her systolic blood pressure yesterday was 130 (she didn't have an accurate number).   Blood pressure this morning 127/66 HR 77. She did state that she feels better this morning and does not have any more symptoms. She has been instructed to call back if she experiences anymore symptoms or if her blood pressure goes too high or low. She has verbalized her understanding.

## 2017-06-25 ENCOUNTER — Ambulatory Visit (INDEPENDENT_AMBULATORY_CARE_PROVIDER_SITE_OTHER): Payer: Medicare Other | Admitting: Family Medicine

## 2017-06-25 ENCOUNTER — Encounter: Payer: Self-pay | Admitting: Family Medicine

## 2017-06-25 VITALS — BP 138/80 | HR 70 | Temp 98.3°F | Ht 63.0 in | Wt 164.2 lb

## 2017-06-25 DIAGNOSIS — K4090 Unilateral inguinal hernia, without obstruction or gangrene, not specified as recurrent: Secondary | ICD-10-CM | POA: Insufficient documentation

## 2017-06-25 DIAGNOSIS — I10 Essential (primary) hypertension: Secondary | ICD-10-CM | POA: Diagnosis not present

## 2017-06-25 DIAGNOSIS — I5032 Chronic diastolic (congestive) heart failure: Secondary | ICD-10-CM

## 2017-06-25 DIAGNOSIS — E119 Type 2 diabetes mellitus without complications: Secondary | ICD-10-CM | POA: Diagnosis not present

## 2017-06-25 DIAGNOSIS — E034 Atrophy of thyroid (acquired): Secondary | ICD-10-CM | POA: Diagnosis not present

## 2017-06-25 LAB — COMPREHENSIVE METABOLIC PANEL
ALT: 11 U/L (ref 0–35)
AST: 17 U/L (ref 0–37)
Albumin: 3.8 g/dL (ref 3.5–5.2)
Alkaline Phosphatase: 66 U/L (ref 39–117)
BUN: 18 mg/dL (ref 6–23)
CHLORIDE: 107 meq/L (ref 96–112)
CO2: 30 meq/L (ref 19–32)
CREATININE: 0.91 mg/dL (ref 0.40–1.20)
Calcium: 9.3 mg/dL (ref 8.4–10.5)
GFR: 61.65 mL/min (ref >60.00)
GLUCOSE: 92 mg/dL (ref 70–99)
POTASSIUM: 4.8 meq/L (ref 3.5–5.1)
SODIUM: 140 meq/L (ref 135–145)
Total Bilirubin: 0.5 mg/dL (ref 0.2–1.2)
Total Protein: 6.4 g/dL (ref 6.0–8.3)

## 2017-06-25 LAB — HEMOGLOBIN A1C: HEMOGLOBIN A1C: 6.4 % (ref 4.6–6.5)

## 2017-06-25 LAB — CBC
HEMATOCRIT: 38.7 % (ref 36.0–46.0)
Hemoglobin: 12.7 g/dL (ref 12.0–15.0)
MCHC: 32.9 g/dL (ref 30.0–36.0)
MCV: 89.4 fl (ref 78.0–100.0)
Platelets: 184 10*3/uL (ref 150.0–400.0)
RBC: 4.32 Mil/uL (ref 3.87–5.11)
RDW: 13.5 % (ref 11.5–15.5)
WBC: 5.7 10*3/uL (ref 4.0–10.5)

## 2017-06-25 LAB — TSH: TSH: 0.82 u[IU]/mL (ref 0.35–4.50)

## 2017-06-25 MED ORDER — DICLOFENAC SODIUM 1 % TD GEL: 2.0000 g | Freq: Four times a day (QID) | TRANSDERMAL | 3 refills | Status: DC

## 2017-06-25 NOTE — Assessment & Plan Note (Signed)
S: controlled on losartan 100mg  (changed from valsartan 320mg ), imdur 90mg  and lasix 60mg .  BP Readings from Last 3 Encounters:  06/25/17 138/80  12/25/16 136/86  12/16/16 (!) 187/82  A/P: We discussed blood pressure goal of <140/90. She has been lightheaded since starting the losartan- we will trial irbesartan 300mg  instead - sees cardiology in next 2 weeks and can be reassessed. Continue other meds

## 2017-06-25 NOTE — Assessment & Plan Note (Signed)
Weight up very slightly. Trace edema. Compliant with her Lasix. Monitor only for now-she will follow-up for continued weight gain or if she becomes symptomatic

## 2017-06-25 NOTE — Assessment & Plan Note (Signed)
S: patient complains of bulge in left groin. Causes her pain daily and particularly at night and hard to get to sleep at times.  A/P: obviously surgery in a 83 year old woman is not ideal particularly with diabetes, CAD, HF- but this pain is causing some decrease in quality of life- she wants to discuss with cardiology when she sees them in 2 weeks.

## 2017-06-25 NOTE — Progress Notes (Addendum)
Subjective:  Victoria Gomez is a 83 y.o. year old very pleasant female patient who presents for/with See problem oriented charting ROS- No worsening edema. No fever or chills. No nausea of vomiting. No low blood sugar    Past Medical History-  Patient Active Problem List   Diagnosis Date Noted  . Diabetes mellitus II 09/22/2013    Priority: High  . Atrial fibrillation (HCC) 09/21/2013    Priority: High  . H/O sinus bradycardia 08/10/2012    Priority: High  . Diastolic HF (heart failure) (HCC) 11/17/2011    Priority: High  . Coronary atherosclerosis 08/03/2007    Priority: High  . Osteopenia 06/28/2015    Priority: Medium  . Vitamin B12 deficiency 07/28/2014    Priority: Medium  . CKD (chronic kidney disease), stage III 09/22/2013    Priority: Medium  . LBBB (left bundle branch block) 09/22/2013    Priority: Medium  . Interstitial cystitis     Priority: Medium  . Hyperlipidemia 03/07/2008    Priority: Medium  . Hypothyroidism 08/03/2007    Priority: Medium  . Essential hypertension 08/03/2007    Priority: Medium  . Internal hemorrhoids     Priority: Low  . Orthostatic hypotension 08/10/2012    Priority: Low  . Fatigue 04/20/2012    Priority: Low  . ARTHRITIS 12/30/2010    Priority: Low  . Left lumbar radiculopathy 12/11/2010    Priority: Low  . Esophageal reflux 10/18/2007    Priority: Low  . BARRETT'S ESOPHAGUS, HX OF 08/03/2007    Priority: Low  . Left groin hernia 06/25/2017    Medications- reviewed and updated Current Outpatient Prescriptions  Medication Sig Dispense Refill  . acetaminophen (TYLENOL) 500 MG tablet Take 500 mg by mouth every 6 (six) hours as needed for mild pain. Reported on 12/12/2015    . apixaban (ELIQUIS) 2.5 MG TABS tablet Take 1 tablet (2.5 mg total) by mouth 2 (two) times daily. 180 tablet 1  . furosemide (LASIX) 40 MG tablet TAKE 1.5 TABLETS (60 MG TOTAL) BY MOUTH DAILY. 60 tablet 5  . isosorbide mononitrate (IMDUR) 60 MG 24 hr tablet  TAKE 1 AND 1/2 TABLETS     (=90MG ) DAILY (KREMERS     URBAN MFR) 135 tablet 3  . lansoprazole (PREVACID) 30 MG capsule Take 1 capsule (30 mg total) by mouth 2 (two) times daily. 180 capsule 2  . levothyroxine (SYNTHROID) 50 MCG tablet Take 1 tablet (50 mcg total) by mouth daily before breakfast. 90 tablet 3  . losartan (COZAAR) 100 MG tablet Take 1 tablet (100 mg total) by mouth daily. 30 tablet 3  . metoprolol tartrate (LOPRESSOR) 25 MG tablet TAKE 1/2 TABLET BY MOUTH AS NEEDED FOR PALPITATIONS 45 tablet 2  . nitroGLYCERIN (NITROSTAT) 0.4 MG SL tablet ONE UNDER THE TONGUE EVERY 5 MINUTES AS NEEDED FOR CHEST PAIN. IF PAIN AFTER 3 PILLS, CALL 911 25 tablet 1  . ONETOUCH DELICA LANCETS 33G MISC Use to test blood sugars daily. Dx: E11.9 100 each 11  . ONETOUCH VERIO test strip USE TO TEST BLOOD SUGARS DAILY. DX: E11.9 100 each 3  . pentosan polysulfate (ELMIRON) 100 MG capsule Take 100 mg by mouth 2 (two) times daily.     . potassium chloride (KLOR-CON 10) 10 MEQ tablet Take 1 tablet (10 mEq total) by mouth daily. 90 tablet 1  . pravastatin (PRAVACHOL) 40 MG tablet TAKE 1 TABLET DAILY 90 tablet 3  . SYNTHROID 50 MCG tablet TAKE 1 TABLET DAILY BEFORE  BREAKFAST 90 tablet 3  . diclofenac sodium (VOLTAREN) 1 % GEL Apply 2 g topically 4 (four) times daily. 100 g 3   No current facility-administered medications for this visit.     Objective: BP 138/80 (BP Location: Left Arm, Patient Position: Sitting, Cuff Size: Large)   Pulse 70   Temp 98.3 F (36.8 C) (Oral)   Ht 5\' 3"  (1.6 m)   Wt 164 lb 3.2 oz (74.5 kg)   SpO2 96%   BMI 29.09 kg/m  Gen: NAD, resting comfortably CV: Irregularly irregular no murmurs rubs or gallops Lungs: CTAB no crackles, wheeze, rhonchi Abdomen: soft/nontender/nondistended/normal bowel sounds. No rebound or guarding.  In left groin there is a painful bulge that is not easily reducible Ext: Trace edema Skin: warm, dry  Assessment/Plan:  Left ankle pain - refill  voltaren  Diabetes mellitus II S: well controlled. On no rx. Home cbgs still <125 fasting.  Lab Results  Component Value Date   HGBA1C 6.4 12/25/2016   HGBA1C 6.3 06/26/2016   HGBA1C 6.5 12/12/2015   A/P: update a1c today  Hypothyroidism S: Lab Results  Component Value Date   TSH 1.03 12/25/2016   On thyroid medication-levothyroxine A/P: update tsh today with labs  Essential hypertension S: controlled on losartan 100mg  (changed from valsartan 320mg ), imdur 90mg  and lasix 60mg .  BP Readings from Last 3 Encounters:  06/25/17 138/80  12/25/16 136/86  12/16/16 (!) 187/82  A/P: We discussed blood pressure goal of <140/90. She has been lightheaded since starting the losartan- we will trial irbesartan 300mg  instead - sees cardiology in next 2 weeks and can be reassessed. Continue other meds  Left groin hernia S: patient complains of bulge in left groin. Causes her pain daily and particularly at night and hard to get to sleep at times.  A/P: obviously surgery in a 83 year old woman is not ideal particularly with diabetes, CAD, HF- but this pain is causing some decrease in quality of life- she wants to discuss with cardiology when she sees them in 2 weeks.   Diastolic HF (heart failure) (HCC) Weight up very slightly. Trace edema. Compliant with her Lasix. Monitor only for now-she will follow-up for continued weight gain or if she becomes symptomatic  6 month follow up. Will plan on getting awv set up at that time.   Orders Placed This Encounter  Procedures  . CBC    [place]  . Comprehensive metabolic panel    [place]  . Hemoglobin A1c    [place]  . TSH    [place]    Meds ordered this encounter  Medications  . diclofenac sodium (VOLTAREN) 1 % GEL    Sig: Apply 2 g topically 4 (four) times daily.    Dispense:  100 g    Refill:  3    Return precautions advised.  Tana Conch, MD

## 2017-06-25 NOTE — Assessment & Plan Note (Signed)
S: Lab Results  Component Value Date   TSH 1.03 12/25/2016   On thyroid medication-levothyroxine 50mcg A/P: update tsh today with labs

## 2017-06-25 NOTE — Assessment & Plan Note (Signed)
S: well controlled. On no rx. Home cbgs still <125 fasting.  Lab Results  Component Value Date   HGBA1C 6.4 12/25/2016   HGBA1C 6.3 06/26/2016   HGBA1C 6.5 12/12/2015   A/P: update a1c today

## 2017-06-25 NOTE — Patient Instructions (Addendum)
Please stop by lab before you go  Please talk with your heart doctors about whether they think you would be a candidate for surgery- I am happy to refer you at that time  ______________________________________________________________________  Starting October 1st 2018, I will be transferring to our new location:  I would love to have you remain my patient at this new location as long as it remains convenient for you. I am excited about the opportunity to have x-ray and sports medicine in the new building but will really miss the awesome staff and physicians at [facility]. Continue to schedule appointments at [facility] and we will automatically transfer them to the [place] location starting October 1st.

## 2017-06-26 ENCOUNTER — Telehealth: Payer: Self-pay | Admitting: Cardiology

## 2017-06-26 NOTE — Telephone Encounter (Signed)
Error

## 2017-06-29 ENCOUNTER — Other Ambulatory Visit: Payer: Self-pay | Admitting: Family Medicine

## 2017-07-09 ENCOUNTER — Encounter: Payer: Self-pay | Admitting: Cardiology

## 2017-07-09 ENCOUNTER — Ambulatory Visit (INDEPENDENT_AMBULATORY_CARE_PROVIDER_SITE_OTHER): Payer: Medicare Other | Admitting: Cardiology

## 2017-07-09 VITALS — BP 138/76 | HR 97 | Ht 63.0 in | Wt 165.0 lb

## 2017-07-09 DIAGNOSIS — K4090 Unilateral inguinal hernia, without obstruction or gangrene, not specified as recurrent: Secondary | ICD-10-CM | POA: Diagnosis not present

## 2017-07-09 DIAGNOSIS — I482 Chronic atrial fibrillation: Secondary | ICD-10-CM | POA: Diagnosis not present

## 2017-07-09 DIAGNOSIS — Z9861 Coronary angioplasty status: Secondary | ICD-10-CM

## 2017-07-09 DIAGNOSIS — I2510 Atherosclerotic heart disease of native coronary artery without angina pectoris: Secondary | ICD-10-CM

## 2017-07-09 DIAGNOSIS — I4891 Unspecified atrial fibrillation: Secondary | ICD-10-CM

## 2017-07-09 DIAGNOSIS — Z7901 Long term (current) use of anticoagulants: Secondary | ICD-10-CM

## 2017-07-09 DIAGNOSIS — I10 Essential (primary) hypertension: Secondary | ICD-10-CM

## 2017-07-09 DIAGNOSIS — I4821 Permanent atrial fibrillation: Secondary | ICD-10-CM

## 2017-07-09 MED ORDER — LOSARTAN POTASSIUM 50 MG PO TABS: 50.0000 mg | ORAL_TABLET | Freq: Every day | ORAL | 3 refills | Status: DC

## 2017-07-09 NOTE — Patient Instructions (Signed)
Medication Instructions: DECREASE Losartan to 50 mg tablet daily   If you need a refill on your cardiac medications before your next appointment, please call your pharmacy.    Follow-Up: Your physician wants you to follow-up in: 2 weeks with Dr Antoine PocheHochrein or with Corine ShelterLuke Kilroy PA (with Hochrein in the office that day)       Thank you for choosing Heart[facility] at [facility]!!

## 2017-07-09 NOTE — Progress Notes (Signed)
07/09/2017 Victoria Gomez   Feb 05, 1925  [id number]  Primary Physician Durene Cal Aldine Contes, MD Primary Cardiologist: Dr Antoine Poche  HPI:  This is the first time I\I've seen this pleasant 83 y/o female who has a history of CAD dating back to 2000 and CAF. She had a remote RCA PCI which was noted to be occluded at cath in 2012. She had residual 40% LAD. She has CAF- failed DCCV. She has had slow rates in the past on Toprol and this was changed to PRN Metoprolol 12.5 mg BID but she tells me she takes this every day and her HR stays in the 80's. She was recently taken off Valsartan and placed on Losartan 100 mg. She says this makes her dizzy. She has multiple other complaints. She has a Lt inguinal hernia and wanted to know if she could have surgery for this. She has some pain from this but not all the time. She also tells me she has epigastric pain from time to time. She can't tell me how long it lasts. She did tell me she takes a NTG for it.    Current Outpatient Prescriptions  Medication Sig Dispense Refill  . acetaminophen (TYLENOL) 500 MG tablet Take 500 mg by mouth every 6 (six) hours as needed for mild pain. Reported on 12/12/2015    . apixaban (ELIQUIS) 2.5 MG TABS tablet Take 1 tablet (2.5 mg total) by mouth 2 (two) times daily. 180 tablet 1  . diclofenac sodium (VOLTAREN) 1 % GEL Apply 2 g topically 4 (four) times daily. 100 g 3  . furosemide (LASIX) 40 MG tablet TAKE 1.5 TABLETS (60 MG TOTAL) BY MOUTH DAILY. 60 tablet 5  . isosorbide mononitrate (IMDUR) 60 MG 24 hr tablet TAKE 1 AND 1/2 TABLETS     (=90MG ) DAILY (KREMERS     URBAN MFR) 135 tablet 3  . KLOR-CON 10 10 MEQ tablet TAKE 1 TABLET BY MOUTH DAILY 90 tablet 1  . lansoprazole (PREVACID) 30 MG capsule Take 1 capsule (30 mg total) by mouth 2 (two) times daily. 180 capsule 2  . levothyroxine (SYNTHROID) 50 MCG tablet Take 1 tablet (50 mcg total) by mouth daily before breakfast. 90 tablet 3  . losartan (COZAAR) 50 MG tablet Take 1 tablet  (50 mg total) by mouth daily. 90 tablet 3  . metoprolol tartrate (LOPRESSOR) 25 MG tablet TAKE 1/2 TABLET BY MOUTH AS NEEDED FOR PALPITATIONS 45 tablet 2  . nitroGLYCERIN (NITROSTAT) 0.4 MG SL tablet ONE UNDER THE TONGUE EVERY 5 MINUTES AS NEEDED FOR CHEST PAIN. IF PAIN AFTER 3 PILLS, CALL 911 25 tablet 1  . ONETOUCH DELICA LANCETS 33G MISC Use to test blood sugars daily. Dx: E11.9 100 each 11  . ONETOUCH VERIO test strip USE TO TEST BLOOD SUGARS DAILY. DX: E11.9 100 each 3  . pentosan polysulfate (ELMIRON) 100 MG capsule Take 100 mg by mouth 2 (two) times daily.     . pravastatin (PRAVACHOL) 40 MG tablet TAKE 1 TABLET DAILY 90 tablet 3   No current facility-administered medications for this visit.     Allergies  Allergen Reactions  . Codeine Hives and Nausea And Vomiting  . Sulfonamide Derivatives Swelling    Turns red    Past Medical History:  Diagnosis Date  . Arthritis   . BARRETT'S ESOPHAGUS, HX OF   . Bladder disorder   . Bradycardia 08/2012   a. Eval for symptomatic bradycardia 08/2012 - beta blocker discontinued.  [name] BREAST MASS, BENIGN   .  CAD (coronary artery disease)    a. s/p PCI to RCA '01. b. repeat PCI to RCA '03 2/2 ISR. c. cath 2012: RCA dz, rx medically.  Occluded RCA.  Nonobstructive disease elsewhere  . Chronic combined systolic and diastolic CHF (congestive heart failure) (HCC)    a. EF 40-45% in 2014. b. EF reported to be normal in 03/2015 nuc.  . CKD (chronic kidney disease), stage III   . DIVERTICULOSIS, COLON   . Epistaxis   . Esophageal reflux   . History of breast lump/mass excision 05/23/2009   Qualifier: History of  By: Lovell Sheehan MD, Balinda Quails   . HYPERLIPIDEMIA 03/07/2008  . HYPERTENSION 08/03/2007  . HYPOTHYROIDISM   . Internal hemorrhoids   . Interstitial cystitis   . LBBB (left bundle branch block)   . Orthostatic hypotension 08/2012   a. 08/2012 w/ syncope - meds adjusted.   [name] PAF (paroxysmal atrial fibrillation) (HCC)    a. Dx 09/2013.  . Right  hand fracture     Social History   Social History  . Marital status: Widowed    Spouse name: N/A  . Number of children: N/A  . Years of education: N/A   Occupational History  . retired    Social History Main Topics  . Smoking status: Former Smoker    Packs/day: 1.00    Years: 15.00    Types: Cigarettes    Quit date: 11/10/1969  . Smokeless tobacco: Never Used  . Alcohol use No  . Drug use: No  . Sexual activity: No   Other Topics Concern  . Not on file   Social History Narrative   Widowed 05/2013. 2nd marriage. First husband died of cancer. 3 sons-[name]- 2 sons, 2 twin girls, [name]-1 son, no grandsons. [name]-1 son, 1 daughter, no grandkids.       LIves with youngest son (has cancer). Son drives her but has license. Independent IADLs.       Retired from [employer] after 33 years.       Hobbies: crocheting, used to bowl with husband, reading      Living will: currently wants resuscitation.      Family History  Problem Relation Age of Onset  . Colon cancer Mother   . Coronary artery disease Mother   . Heart disease Mother   . Coronary artery disease Father   . Heart disease Father   . Colon polyps Sister   . Coronary artery disease Sister   . Diabetes type II Son   . Coronary artery disease Brother      Review of Systems: General: negative for chills, fever, night sweats or weight changes.  Cardiovascular: negative for chest pain, dyspnea on exertion, edema, orthopnea, palpitations, paroxysmal nocturnal dyspnea or shortness of breath Dermatological: negative for rash Respiratory: negative for cough or wheezing Urologic: negative for hematuria Abdominal: negative for nausea, vomiting, diarrhea, bright red blood per rectum, melena, or hematemesis Neurologic: negative for visual changes, syncope, or dizziness All other systems reviewed and are otherwise negative except as noted above.    Blood pressure 138/76, pulse 97, height 5\' 3"  (1.6 m), weight 165 lb  (74.8 kg).  General appearance: alert, cooperative and no distress Lungs: clear to auscultation bilaterally Heart: irregularly irregular rhythm Abdomen: soft, not distended Neurologic: Grossly normal  EKG AF-LBBB-rate 97  ASSESSMENT AND PLAN:   CAD S/P percutaneous coronary angioplasty - prior history PCI to RCA '01, repeat PCI to RCA '03 2/2 ISR, last cath 2012 -occluded RCA, minor  LAD disease-plan for med rx  Permanent atrial fibrillation (HCC) CAF with CVR- she had bradycardia on Toprol, now on metoprolol 12.5 mg BID  Chronic anticoagulation Eliquis 2.5 mg BID  Left groin hernia She asked if she could have surgery to have this fixed   PLAN  I suggested we change her to Losartan 50 mg. I'm not sure what her chest pain is but I'm not convinced it's angina. I doubt a surgeon would operate on her hernia unless forced to and she would be at least a moderate risk for cardiac complications. I'll see her back in a few weeks. The pt seems to think all this started when her Valsartan was changed, we'll see if cutting her Losartan helps.   Victoria ShelterLuke Coryn Mosso PA-C 07/09/2017 3:06 PM

## 2017-07-09 NOTE — Assessment & Plan Note (Signed)
Eliquis 2.5mg BID.  

## 2017-07-09 NOTE — Assessment & Plan Note (Signed)
CAF with CVR- she had bradycardia on Toprol, now on metoprolol 12.5 mg BID

## 2017-07-09 NOTE — Assessment & Plan Note (Signed)
-   prior history PCI to RCA '01, repeat PCI to RCA '03 2/2 ISR, last cath 2012 -occluded RCA, minor LAD disease-plan for med rx

## 2017-07-09 NOTE — Assessment & Plan Note (Signed)
She asked if she could have surgery to have this fixed

## 2017-08-03 ENCOUNTER — Encounter: Payer: Self-pay | Admitting: Cardiology

## 2017-08-03 ENCOUNTER — Ambulatory Visit (INDEPENDENT_AMBULATORY_CARE_PROVIDER_SITE_OTHER): Payer: Medicare Other | Admitting: Cardiology

## 2017-08-03 VITALS — BP 143/80 | HR 78 | Ht 63.0 in | Wt 164.8 lb

## 2017-08-03 DIAGNOSIS — I482 Chronic atrial fibrillation: Secondary | ICD-10-CM

## 2017-08-03 DIAGNOSIS — I10 Essential (primary) hypertension: Secondary | ICD-10-CM

## 2017-08-03 DIAGNOSIS — I2510 Atherosclerotic heart disease of native coronary artery without angina pectoris: Secondary | ICD-10-CM

## 2017-08-03 DIAGNOSIS — I447 Left bundle-branch block, unspecified: Secondary | ICD-10-CM

## 2017-08-03 DIAGNOSIS — Z9861 Coronary angioplasty status: Secondary | ICD-10-CM | POA: Diagnosis not present

## 2017-08-03 DIAGNOSIS — I4821 Permanent atrial fibrillation: Secondary | ICD-10-CM

## 2017-08-03 NOTE — Patient Instructions (Signed)
Medication Instructions:  Your physician recommends that you continue on your current medications as directed. Please refer to the Current Medication list given to you today.  Follow-Up: Your physician wants you to follow-up in: 6 months with Dr. Hochrein. You will receive a reminder letter in the mail two months in advance. If you don't receive a letter, please call our office to schedule the follow-up appointment.    If you need a refill on your cardiac medications before your next appointment, please call your pharmacy.   

## 2017-08-03 NOTE — Progress Notes (Signed)
08/03/2017 Victoria Gomez   20-Oct-1925  [id number]  Primary Physician Durene Cal Aldine Contes, MD Primary Cardiologist: Dr Antoine Poche  HPI:  Pt is a pleasant 83 y/o female who has a history of CAD dating back to 2000, and CAF. She is a widow whose son (a cancer survivor) lives with her. "We take care of each other". She had a remote RCA PCI which was noted to be occluded at cath in 2012. She had residual 40% LAD. She has CAF- failed DCCV. She has had slow rates in the past on Toprol and this was changed to Metoprolol 12.5 mg BID and HR stays in the 80's. She was recently taken off Valsartan and placed on Losartan 100 mg. I saw her in the office 07/09/17 and she was complaining of dizziness. I cut her Losartan back to to 50 mg and she is seen today in f/u.  She tells me she feels better, no chest pain, no further dizziness. Her main concern today is her back and an inguinal hernia that's been bothering her.    Current Outpatient Prescriptions  Medication Sig Dispense Refill  . acetaminophen (TYLENOL) 500 MG tablet Take 500 mg by mouth every 6 (six) hours as needed for mild pain. Reported on 12/12/2015    . apixaban (ELIQUIS) 2.5 MG TABS tablet Take 1 tablet (2.5 mg total) by mouth 2 (two) times daily. 180 tablet 1  . diclofenac sodium (VOLTAREN) 1 % GEL Apply 2 g topically 4 (four) times daily. 100 g 3  . furosemide (LASIX) 40 MG tablet TAKE 1.5 TABLETS (60 MG TOTAL) BY MOUTH DAILY. 60 tablet 5  . isosorbide mononitrate (IMDUR) 60 MG 24 hr tablet TAKE 1 AND 1/2 TABLETS     (=90MG ) DAILY (KREMERS     URBAN MFR) 135 tablet 3  . KLOR-CON 10 10 MEQ tablet TAKE 1 TABLET BY MOUTH DAILY 90 tablet 1  . lansoprazole (PREVACID) 30 MG capsule Take 1 capsule (30 mg total) by mouth 2 (two) times daily. 180 capsule 2  . levothyroxine (SYNTHROID) 50 MCG tablet Take 1 tablet (50 mcg total) by mouth daily before breakfast. 90 tablet 3  . losartan (COZAAR) 50 MG tablet Take 1 tablet (50 mg total) by mouth daily. 90 tablet  3  . metoprolol tartrate (LOPRESSOR) 25 MG tablet TAKE 1/2 TABLET BY MOUTH AS NEEDED FOR PALPITATIONS 45 tablet 2  . nitroGLYCERIN (NITROSTAT) 0.4 MG SL tablet ONE UNDER THE TONGUE EVERY 5 MINUTES AS NEEDED FOR CHEST PAIN. IF PAIN AFTER 3 PILLS, CALL 911 25 tablet 1  . ONETOUCH DELICA LANCETS 33G MISC Use to test blood sugars daily. Dx: E11.9 100 each 11  . ONETOUCH VERIO test strip USE TO TEST BLOOD SUGARS DAILY. DX: E11.9 100 each 3  . pentosan polysulfate (ELMIRON) 100 MG capsule Take 100 mg by mouth 2 (two) times daily.     . pravastatin (PRAVACHOL) 40 MG tablet TAKE 1 TABLET DAILY 90 tablet 3   No current facility-administered medications for this visit.     Allergies  Allergen Reactions  . Codeine Hives and Nausea And Vomiting  . Sulfonamide Derivatives Swelling    Turns red    Past Medical History:  Diagnosis Date  . Arthritis   . BARRETT'S ESOPHAGUS, HX OF   . Bladder disorder   . Bradycardia 08/2012   a. Eval for symptomatic bradycardia 08/2012 - beta blocker discontinued.  [name] BREAST MASS, BENIGN   . CAD (coronary artery disease)  a. s/p PCI to RCA '01. b. repeat PCI to RCA '03 2/2 ISR. c. cath 2012: RCA dz, rx medically.  Occluded RCA.  Nonobstructive disease elsewhere  . Chronic combined systolic and diastolic CHF (congestive heart failure) (HCC)    a. EF 40-45% in 2014. b. EF reported to be normal in 03/2015 nuc.  . CKD (chronic kidney disease), stage III   . DIVERTICULOSIS, COLON   . Epistaxis   . Esophageal reflux   . History of breast lump/mass excision 05/23/2009   Qualifier: History of  By: Lovell Sheehan MD, Balinda Quails   . HYPERLIPIDEMIA 03/07/2008  . HYPERTENSION 08/03/2007  . HYPOTHYROIDISM   . Internal hemorrhoids   . Interstitial cystitis   . LBBB (left bundle branch block)   . Orthostatic hypotension 08/2012   a. 08/2012 w/ syncope - meds adjusted.   [name] PAF (paroxysmal atrial fibrillation) (HCC)    a. Dx 09/2013.  . Right hand fracture     Social History    Social History  . Marital status: Widowed    Spouse name: N/A  . Number of children: N/A  . Years of education: N/A   Occupational History  . retired    Social History Main Topics  . Smoking status: Former Smoker    Packs/day: 1.00    Years: 15.00    Types: Cigarettes    Quit date: 11/10/1969  . Smokeless tobacco: Never Used  . Alcohol use No  . Drug use: No  . Sexual activity: No   Other Topics Concern  . Not on file   Social History Narrative   Widowed 05/2013. 2nd marriage. First husband died of cancer. 3 sons-[name]- 2 sons, 2 twin girls, [name]-1 son, no grandsons. [name]-1 son, 1 daughter, no grandkids.       LIves with youngest son (has cancer). Son drives her but has license. Independent IADLs.       Retired from [employer] after 33 years.       Hobbies: crocheting, used to bowl with husband, reading      Living will: currently wants resuscitation.      Family History  Problem Relation Age of Onset  . Colon cancer Mother   . Coronary artery disease Mother   . Heart disease Mother   . Coronary artery disease Father   . Heart disease Father   . Colon polyps Sister   . Coronary artery disease Sister   . Diabetes type II Son   . Coronary artery disease Brother      Review of Systems: General: negative for chills, fever, night sweats or weight changes.  Cardiovascular: negative for chest pain, dyspnea on exertion, edema, orthopnea, palpitations, paroxysmal nocturnal dyspnea or shortness of breath Dermatological: negative for rash Respiratory: negative for cough or wheezing Urologic: negative for hematuria Abdominal: negative for nausea, vomiting, diarrhea, bright red blood per rectum, melena, or hematemesis Neurologic: negative for visual changes, syncope, or dizziness All other systems reviewed and are otherwise negative except as noted above.    Blood pressure (!) 143/80, pulse 78, height  (1.6 m), weight 164 lb 12.8 oz (74.8 kg).  General  appearance: alert, cooperative and no distress Neck: no carotid bruit and no JVD Lungs: few crackles Lt base Heart: irregularly irregular rhythm Extremities: trace edema Skin: Skin color, texture, turgor normal. No rashes or lesions Neurologic: Grossly normal  EKG AF with VR 80, LBBB  ASSESSMENT AND PLAN:   Mild orthostatic hypotension secondary to medication change- This improved with  adjustment in her Losartan dose  CAD S/P percutaneous coronary angioplasty - prior history PCI to RCA '01, repeat PCI to RCA '03 2/2 ISR, last cath 2012 -occluded RCA, minor LAD disease-plan for med rx  Permanent atrial fibrillation (HCC) CAF with CVR- she had bradycardia on Toprol, now on metoprolol 12.5 mg BID  Chronic anticoagulation Eliquis 2.5 mg BID  Left groin hernia She asked if she could have surgery to have this fixed  DJD back- She is followed by Dr Ethelene Hal. She asked about getting a back injection which she has had in the past.   PLAN  I suggested she ask Dr Durene Cal about a referral to [facility] to be evaluated for possible hernia repair. Though she has known CAD she had a Myoview in May 2016 that was low risk. She had some chest discomfort complaints at her LOV but I'm not convinced this was angina. She would be an acceptable risk from a cardiac standpoint for hernia surgery if indicated.   I also told her it would be OK to hold her Eliquis for spinal injections if these are needed.  Victoria Shelter PA-C 08/03/2017 3:38 PM

## 2017-08-05 ENCOUNTER — Other Ambulatory Visit: Payer: Self-pay

## 2017-08-05 ENCOUNTER — Telehealth: Payer: Self-pay | Admitting: Cardiology

## 2017-08-05 MED ORDER — METOPROLOL TARTRATE 25 MG PO TABS: ORAL_TABLET | ORAL | 3 refills | Status: DC

## 2017-08-05 NOTE — Telephone Encounter (Signed)
Refill sent.

## 2017-08-05 NOTE — Telephone Encounter (Signed)
°*  STAT* If patient is at the pharmacy, call can be transferred to refill team.   1. Which medications need to be refilled? (please list name of each medication and dose if known) metoprolol  1 tablet as needed   2. Which pharmacy/location (including street and city if local pharmacy) is medication to be sent to? [facility] [address]   3. Do they need a 30 day or 90 day supply? 90   Pt verbalized that she ran out of the medication early because its written as needed but pt take a half a tablet twice a day and she want the medication changed   Because the pharmacist states that its too early for a refill.  Routing to refill and also  Routing to rn because order needs to be changed

## 2017-08-07 ENCOUNTER — Other Ambulatory Visit: Payer: Self-pay

## 2017-08-07 MED ORDER — METOPROLOL TARTRATE 25 MG PO TABS: 12.5000 mg | ORAL_TABLET | Freq: Two times a day (BID) | ORAL | 3 refills | Status: DC

## 2017-08-07 NOTE — Telephone Encounter (Signed)
Follow up     Pt states she needs her prescription changed. She said she needs to speak with the nurse. Please call.   *STAT* If patient is at the pharmacy, call can be transferred to refill team.   1. Which medications need to be refilled? (please list name of each medication and dose if known) metoprolol 20 mg  2. Which pharmacy/location (including street and city if local pharmacy) is medication to be sent to? [facility] on [address]  3. Do they need a 30 day or 90 day supply? 90 day

## 2017-08-19 ENCOUNTER — Other Ambulatory Visit: Payer: Self-pay | Admitting: Cardiology

## 2017-08-19 DIAGNOSIS — K2100 Gastro-esophageal reflux disease with esophagitis, without bleeding: Secondary | ICD-10-CM

## 2017-08-20 NOTE — Telephone Encounter (Signed)
This is Dr. Hochrein's pt. °

## 2017-08-20 NOTE — Telephone Encounter (Signed)
REFILL 

## 2017-09-09 DIAGNOSIS — M47816 Spondylosis without myelopathy or radiculopathy, lumbar region: Secondary | ICD-10-CM | POA: Diagnosis not present

## 2017-09-13 ENCOUNTER — Inpatient Hospital Stay (HOSPITAL_COMMUNITY)
Admission: EM | Admit: 2017-09-13 | Discharge: 2017-09-17 | DRG: 286 | Disposition: A | Discharge status: Home / Self Care | Payer: Medicare Other | Attending: Internal Medicine | Admitting: Internal Medicine

## 2017-09-13 ENCOUNTER — Encounter (HOSPITAL_COMMUNITY): Payer: Self-pay | Admitting: Cardiology

## 2017-09-13 ENCOUNTER — Emergency Department (HOSPITAL_COMMUNITY): Payer: Medicare Other

## 2017-09-13 DIAGNOSIS — I509 Heart failure, unspecified: Secondary | ICD-10-CM | POA: Diagnosis not present

## 2017-09-13 DIAGNOSIS — Z885 Allergy status to narcotic agent status: Secondary | ICD-10-CM

## 2017-09-13 DIAGNOSIS — Z90710 Acquired absence of both cervix and uterus: Secondary | ICD-10-CM | POA: Diagnosis not present

## 2017-09-13 DIAGNOSIS — I5043 Acute on chronic combined systolic (congestive) and diastolic (congestive) heart failure: Secondary | ICD-10-CM | POA: Diagnosis present

## 2017-09-13 DIAGNOSIS — I447 Left bundle-branch block, unspecified: Secondary | ICD-10-CM | POA: Diagnosis present

## 2017-09-13 DIAGNOSIS — E119 Type 2 diabetes mellitus without complications: Secondary | ICD-10-CM

## 2017-09-13 DIAGNOSIS — Z9049 Acquired absence of other specified parts of digestive tract: Secondary | ICD-10-CM | POA: Diagnosis not present

## 2017-09-13 DIAGNOSIS — E1121 Type 2 diabetes mellitus with diabetic nephropathy: Secondary | ICD-10-CM | POA: Diagnosis not present

## 2017-09-13 DIAGNOSIS — R05 Cough: Secondary | ICD-10-CM | POA: Diagnosis not present

## 2017-09-13 DIAGNOSIS — Z7901 Long term (current) use of anticoagulants: Secondary | ICD-10-CM | POA: Diagnosis not present

## 2017-09-13 DIAGNOSIS — F05 Delirium due to known physiological condition: Secondary | ICD-10-CM | POA: Diagnosis present

## 2017-09-13 DIAGNOSIS — Z9842 Cataract extraction status, left eye: Secondary | ICD-10-CM

## 2017-09-13 DIAGNOSIS — E785 Hyperlipidemia, unspecified: Secondary | ICD-10-CM | POA: Diagnosis present

## 2017-09-13 DIAGNOSIS — R0602 Shortness of breath: Secondary | ICD-10-CM | POA: Diagnosis not present

## 2017-09-13 DIAGNOSIS — R000 Tachycardia, unspecified: Secondary | ICD-10-CM | POA: Diagnosis not present

## 2017-09-13 DIAGNOSIS — E1169 Type 2 diabetes mellitus with other specified complication: Secondary | ICD-10-CM | POA: Diagnosis present

## 2017-09-13 DIAGNOSIS — E039 Hypothyroidism, unspecified: Secondary | ICD-10-CM | POA: Diagnosis present

## 2017-09-13 DIAGNOSIS — Z9841 Cataract extraction status, right eye: Secondary | ICD-10-CM

## 2017-09-13 DIAGNOSIS — Z9861 Coronary angioplasty status: Secondary | ICD-10-CM

## 2017-09-13 DIAGNOSIS — J9601 Acute respiratory failure with hypoxia: Secondary | ICD-10-CM | POA: Diagnosis present

## 2017-09-13 DIAGNOSIS — I4821 Permanent atrial fibrillation: Secondary | ICD-10-CM | POA: Diagnosis present

## 2017-09-13 DIAGNOSIS — I2510 Atherosclerotic heart disease of native coronary artery without angina pectoris: Secondary | ICD-10-CM | POA: Diagnosis present

## 2017-09-13 DIAGNOSIS — I161 Hypertensive emergency: Secondary | ICD-10-CM | POA: Diagnosis present

## 2017-09-13 DIAGNOSIS — I248 Other forms of acute ischemic heart disease: Secondary | ICD-10-CM | POA: Diagnosis present

## 2017-09-13 DIAGNOSIS — M1990 Unspecified osteoarthritis, unspecified site: Secondary | ICD-10-CM | POA: Diagnosis present

## 2017-09-13 DIAGNOSIS — N183 Chronic kidney disease, stage 3 unspecified: Secondary | ICD-10-CM | POA: Diagnosis present

## 2017-09-13 DIAGNOSIS — N3010 Interstitial cystitis (chronic) without hematuria: Secondary | ICD-10-CM | POA: Diagnosis present

## 2017-09-13 DIAGNOSIS — R531 Weakness: Secondary | ICD-10-CM | POA: Diagnosis not present

## 2017-09-13 DIAGNOSIS — I482 Chronic atrial fibrillation: Secondary | ICD-10-CM | POA: Diagnosis not present

## 2017-09-13 DIAGNOSIS — E118 Type 2 diabetes mellitus with unspecified complications: Secondary | ICD-10-CM

## 2017-09-13 DIAGNOSIS — Z882 Allergy status to sulfonamides status: Secondary | ICD-10-CM

## 2017-09-13 DIAGNOSIS — K5790 Diverticulosis of intestine, part unspecified, without perforation or abscess without bleeding: Secondary | ICD-10-CM | POA: Diagnosis present

## 2017-09-13 DIAGNOSIS — M8580 Other specified disorders of bone density and structure, unspecified site: Secondary | ICD-10-CM | POA: Diagnosis present

## 2017-09-13 DIAGNOSIS — Z87891 Personal history of nicotine dependence: Secondary | ICD-10-CM

## 2017-09-13 DIAGNOSIS — I480 Paroxysmal atrial fibrillation: Secondary | ICD-10-CM | POA: Diagnosis present

## 2017-09-13 DIAGNOSIS — E538 Deficiency of other specified B group vitamins: Secondary | ICD-10-CM | POA: Diagnosis present

## 2017-09-13 DIAGNOSIS — I130 Hypertensive heart and chronic kidney disease with heart failure and stage 1 through stage 4 chronic kidney disease, or unspecified chronic kidney disease: Principal | ICD-10-CM | POA: Diagnosis present

## 2017-09-13 DIAGNOSIS — I110 Hypertensive heart disease with heart failure: Secondary | ICD-10-CM | POA: Diagnosis not present

## 2017-09-13 DIAGNOSIS — K219 Gastro-esophageal reflux disease without esophagitis: Secondary | ICD-10-CM | POA: Diagnosis present

## 2017-09-13 DIAGNOSIS — Z8249 Family history of ischemic heart disease and other diseases of the circulatory system: Secondary | ICD-10-CM

## 2017-09-13 DIAGNOSIS — E1129 Type 2 diabetes mellitus with other diabetic kidney complication: Secondary | ICD-10-CM

## 2017-09-13 DIAGNOSIS — R404 Transient alteration of awareness: Secondary | ICD-10-CM | POA: Diagnosis not present

## 2017-09-13 DIAGNOSIS — Z79890 Hormone replacement therapy (postmenopausal): Secondary | ICD-10-CM

## 2017-09-13 DIAGNOSIS — Z833 Family history of diabetes mellitus: Secondary | ICD-10-CM

## 2017-09-13 DIAGNOSIS — Z8371 Family history of colonic polyps: Secondary | ICD-10-CM

## 2017-09-13 DIAGNOSIS — E1122 Type 2 diabetes mellitus with diabetic chronic kidney disease: Secondary | ICD-10-CM | POA: Diagnosis present

## 2017-09-13 DIAGNOSIS — Z800 Family history of malignant neoplasm of digestive organs: Secondary | ICD-10-CM

## 2017-09-13 LAB — CBC
HCT: 39.2 % (ref 36.0–46.0)
HEMATOCRIT: 38.9 % (ref 36.0–46.0)
HEMOGLOBIN: 12.8 g/dL (ref 12.0–15.0)
Hemoglobin: 12.5 g/dL (ref 12.0–15.0)
MCH: 28.5 pg (ref 26.0–34.0)
MCH: 28.8 pg (ref 26.0–34.0)
MCHC: 32.1 g/dL (ref 30.0–36.0)
MCHC: 32.7 g/dL (ref 30.0–36.0)
MCV: 88.8 fL (ref 78.0–100.0)
MCV: 88.3 fL (ref 78.0–100.0)
PLATELETS: 193 10*3/uL (ref 150–400)
Platelets: 225 10*3/uL (ref 150–400)
RBC: 4.38 MIL/uL (ref 3.87–5.11)
RBC: 4.44 MIL/uL (ref 3.87–5.11)
RDW: 13.7 % (ref 11.5–15.5)
RDW: 13.2 % (ref 11.5–15.5)
WBC: 9.1 10*3/uL (ref 4.0–10.5)
WBC: 7.9 10*3/uL (ref 4.0–10.5)

## 2017-09-13 LAB — I-STAT TROPONIN, ED
Troponin i, poc: 0.01 ng/mL (ref 0.00–0.08)
Troponin i, poc: 0.14 ng/mL (ref 0.00–0.08)

## 2017-09-13 LAB — MAGNESIUM: MAGNESIUM: 1.9 mg/dL (ref 1.7–2.4)

## 2017-09-13 LAB — COMPREHENSIVE METABOLIC PANEL
ALT: 11 U/L — AB (ref 14–54)
AST: 21 U/L (ref 15–41)
Albumin: 3.6 g/dL (ref 3.5–5.0)
Alkaline Phosphatase: 80 U/L (ref 38–126)
Anion gap: 6 (ref 5–15)
BILIRUBIN TOTAL: 0.4 mg/dL (ref 0.3–1.2)
BUN: 13 mg/dL (ref 6–20)
CHLORIDE: 110 mmol/L (ref 101–111)
CO2: 22 mmol/L (ref 22–32)
Calcium: 9.1 mg/dL (ref 8.9–10.3)
Creatinine, Ser: 0.93 mg/dL (ref 0.44–1.00)
GFR calc Af Amer: >60 mL/min (ref >60)
GFR calc non Af Amer: 53 mL/min — ABNORMAL LOW (ref >60)
GLUCOSE: 139 mg/dL — AB (ref 65–99)
POTASSIUM: 4.4 mmol/L (ref 3.5–5.1)
Sodium: 138 mmol/L (ref 135–145)
Total Protein: 7 g/dL (ref 6.5–8.1)

## 2017-09-13 LAB — URINALYSIS, ROUTINE W REFLEX MICROSCOPIC
Bilirubin Urine: NEGATIVE
GLUCOSE, UA: NEGATIVE mg/dL
KETONES UR: NEGATIVE mg/dL
LEUKOCYTES UA: NEGATIVE
Nitrite: NEGATIVE
PROTEIN: NEGATIVE mg/dL
SQUAMOUS EPITHELIAL / LPF: NONE SEEN
Specific Gravity, Urine: 1.004 — ABNORMAL LOW (ref 1.005–1.030)
pH: 6 (ref 5.0–8.0)

## 2017-09-13 LAB — GLUCOSE, CAPILLARY
GLUCOSE-CAPILLARY: 113 mg/dL — AB (ref 65–99)
Glucose-Capillary: 144 mg/dL — ABNORMAL HIGH (ref 65–99)
Glucose-Capillary: 130 mg/dL — ABNORMAL HIGH (ref 65–99)

## 2017-09-13 LAB — LIPASE, BLOOD: Lipase: 26 U/L (ref 11–51)

## 2017-09-13 LAB — CREATININE, SERUM
CREATININE: 0.93 mg/dL (ref 0.44–1.00)
GFR calc Af Amer: >60 mL/min (ref >60)
GFR, EST NON AFRICAN AMERICAN: 53 mL/min — AB (ref >60)

## 2017-09-13 LAB — MRSA PCR SCREENING: MRSA by PCR: NEGATIVE

## 2017-09-13 LAB — CBG MONITORING, ED: GLUCOSE-CAPILLARY: 112 mg/dL — AB (ref 65–99)

## 2017-09-13 LAB — HEMOGLOBIN A1C
Hgb A1c MFr Bld: 6.3 % — ABNORMAL HIGH (ref 4.8–5.6)
MEAN PLASMA GLUCOSE: 134.11 mg/dL

## 2017-09-13 LAB — BRAIN NATRIURETIC PEPTIDE: B Natriuretic Peptide: 611.2 pg/mL — ABNORMAL HIGH (ref 0.0–100.0)

## 2017-09-13 MED ORDER — PANTOPRAZOLE SODIUM 20 MG PO TBEC
20.0000 mg | DELAYED_RELEASE_TABLET | Freq: Every day | ORAL | Status: DC
  Administered 2017-09-13 – 2017-09-17 (×5): 20 mg via ORAL
  Filled 2017-09-13 (×5): qty 1

## 2017-09-13 MED ORDER — ISOSORBIDE MONONITRATE ER 60 MG PO TB24
90.0000 mg | ORAL_TABLET | Freq: Every day | ORAL | Status: DC
  Administered 2017-09-13 – 2017-09-17 (×5): 90 mg via ORAL
  Filled 2017-09-13: qty 1
  Filled 2017-09-13: qty 3
  Filled 2017-09-13 (×3): qty 1

## 2017-09-13 MED ORDER — ASPIRIN EC 81 MG PO TBEC
81.0000 mg | DELAYED_RELEASE_TABLET | Freq: Every day | ORAL | Status: DC
  Administered 2017-09-13 – 2017-09-14 (×2): 81 mg via ORAL
  Filled 2017-09-13 (×2): qty 1

## 2017-09-13 MED ORDER — ACETAMINOPHEN 325 MG PO TABS: 650.0000 mg | ORAL_TABLET | ORAL | Status: DC | PRN

## 2017-09-13 MED ORDER — ALPRAZOLAM 0.25 MG PO TABS
0.2500 mg | ORAL_TABLET | Freq: Two times a day (BID) | ORAL | Status: DC | PRN
  Filled 2017-09-13: qty 1

## 2017-09-13 MED ORDER — SODIUM CHLORIDE 0.9% FLUSH
3.0000 mL | Freq: Two times a day (BID) | INTRAVENOUS | Status: DC
  Administered 2017-09-13 – 2017-09-14 (×3): 3 mL via INTRAVENOUS

## 2017-09-13 MED ORDER — PENTOSAN POLYSULFATE SODIUM 100 MG PO CAPS
100.0000 mg | ORAL_CAPSULE | Freq: Two times a day (BID) | ORAL | Status: DC
  Administered 2017-09-13 – 2017-09-17 (×9): 100 mg via ORAL
  Filled 2017-09-13 (×10): qty 1

## 2017-09-13 MED ORDER — LOSARTAN POTASSIUM 50 MG PO TABS
50.0000 mg | ORAL_TABLET | Freq: Every day | ORAL | Status: DC
  Administered 2017-09-13 – 2017-09-15 (×3): 50 mg via ORAL
  Filled 2017-09-13 (×3): qty 1

## 2017-09-13 MED ORDER — METOPROLOL TARTRATE 12.5 MG HALF TABLET
12.5000 mg | ORAL_TABLET | Freq: Two times a day (BID) | ORAL | Status: DC
  Administered 2017-09-13 – 2017-09-17 (×9): 12.5 mg via ORAL
  Filled 2017-09-13 (×9): qty 1

## 2017-09-13 MED ORDER — FUROSEMIDE 10 MG/ML IJ SOLN
80.0000 mg | Freq: Two times a day (BID) | INTRAMUSCULAR | Status: DC
  Administered 2017-09-13 – 2017-09-14 (×2): 80 mg via INTRAVENOUS
  Filled 2017-09-13 (×2): qty 8

## 2017-09-13 MED ORDER — SODIUM CHLORIDE 0.9% FLUSH: 3.0000 mL | INTRAVENOUS | Status: DC | PRN

## 2017-09-13 MED ORDER — ONDANSETRON HCL 4 MG/2ML IJ SOLN
4.0000 mg | Freq: Four times a day (QID) | INTRAMUSCULAR | Status: DC | PRN
  Administered 2017-09-13: 4 mg via INTRAVENOUS
  Filled 2017-09-13: qty 2

## 2017-09-13 MED ORDER — GUAIFENESIN-DM 100-10 MG/5ML PO SYRP
5.0000 mL | ORAL_SOLUTION | ORAL | Status: DC | PRN
  Administered 2017-09-13: 5 mL via ORAL
  Filled 2017-09-13: qty 5

## 2017-09-13 MED ORDER — TRAZODONE HCL 50 MG PO TABS
50.0000 mg | ORAL_TABLET | Freq: Once | ORAL | Status: AC
  Administered 2017-09-13: 50 mg via ORAL
  Filled 2017-09-13: qty 1

## 2017-09-13 MED ORDER — BENZONATATE 100 MG PO CAPS
200.0000 mg | ORAL_CAPSULE | Freq: Three times a day (TID) | ORAL | Status: DC | PRN
  Administered 2017-09-13: 200 mg via ORAL
  Filled 2017-09-13: qty 2

## 2017-09-13 MED ORDER — LEVOTHYROXINE SODIUM 50 MCG PO TABS
50.0000 ug | ORAL_TABLET | Freq: Every day | ORAL | Status: DC
  Administered 2017-09-13 – 2017-09-17 (×5): 50 ug via ORAL
  Filled 2017-09-13 (×5): qty 1

## 2017-09-13 MED ORDER — NITROGLYCERIN IN D5W 200-5 MCG/ML-% IV SOLN
5.0000 ug/min | INTRAVENOUS | Status: DC
  Administered 2017-09-13: 5 ug/min via INTRAVENOUS
  Filled 2017-09-13: qty 250

## 2017-09-13 MED ORDER — PRAVASTATIN SODIUM 40 MG PO TABS
40.0000 mg | ORAL_TABLET | Freq: Every day | ORAL | Status: DC
Start: 2017-09-13 — End: 2017-09-17
  Administered 2017-09-13 – 2017-09-17 (×5): 40 mg via ORAL
  Filled 2017-09-13 (×5): qty 1

## 2017-09-13 MED ORDER — APIXABAN 2.5 MG PO TABS
2.5000 mg | ORAL_TABLET | Freq: Two times a day (BID) | ORAL | Status: DC
  Administered 2017-09-13 – 2017-09-14 (×3): 2.5 mg via ORAL
  Filled 2017-09-13 (×3): qty 1

## 2017-09-13 MED ORDER — SODIUM CHLORIDE 0.9 % IV SOLN: 250.0000 mL | INTRAVENOUS | Status: DC | PRN

## 2017-09-13 MED ORDER — FUROSEMIDE 10 MG/ML IJ SOLN
40.0000 mg | Freq: Once | INTRAMUSCULAR | Status: AC
  Administered 2017-09-13: 40 mg via INTRAVENOUS
  Filled 2017-09-13: qty 4

## 2017-09-13 MED ORDER — POTASSIUM CHLORIDE CRYS ER 10 MEQ PO TBCR
10.0000 meq | EXTENDED_RELEASE_TABLET | Freq: Every day | ORAL | Status: DC
  Administered 2017-09-13 – 2017-09-17 (×5): 10 meq via ORAL
  Filled 2017-09-13 (×7): qty 1

## 2017-09-13 MED ORDER — DICLOFENAC SODIUM 1 % TD GEL
2.0000 g | Freq: Four times a day (QID) | TRANSDERMAL | Status: DC
  Administered 2017-09-13 – 2017-09-17 (×10): 2 g via TOPICAL
  Filled 2017-09-13: qty 100

## 2017-09-13 MED ORDER — ENOXAPARIN SODIUM 40 MG/0.4ML ~~LOC~~ SOLN: 40.0000 mg | SUBCUTANEOUS | Status: DC

## 2017-09-13 MED ORDER — INSULIN ASPART 100 UNIT/ML ~~LOC~~ SOLN
0.0000 [IU] | Freq: Three times a day (TID) | SUBCUTANEOUS | Status: DC
  Administered 2017-09-14 – 2017-09-15 (×4): 2 [IU] via SUBCUTANEOUS

## 2017-09-13 NOTE — ED Notes (Signed)
Meal tray delivered.

## 2017-09-13 NOTE — ED Notes (Signed)
Son at bedside. Updated on pt plan of care.

## 2017-09-13 NOTE — Progress Notes (Signed)
[facility]. No complaints of SOB with O2 sats 98-100% on 3L [place]. Weaning O2 and nitro gtt stopped. MD Willette PaSheehan made aware via text page.

## 2017-09-13 NOTE — ED Provider Notes (Signed)
[facility] EMERGENCY DEPARTMENT Provider Note   CSN: [id number] Arrival date & time: 09/13/17  0807     History   Chief Complaint Chief Complaint  Patient presents with  . Atrial Fibrillation  . Hypertension    HPI Victoria Gomez is a 83 y.o. female.  The history is provided by the patient, the EMS personnel and medical records.  Shortness of Breath  This is a new problem. The average episode lasts 1 day. The problem occurs continuously.The current episode started 12 to 24 hours ago. The problem has been rapidly worsening. Associated symptoms include cough and leg swelling. Pertinent negatives include no fever, no headaches, no rhinorrhea, no neck pain, no sputum production, no hemoptysis, no wheezing, no chest pain, no syncope, no vomiting, no abdominal pain, no rash and no leg pain. She has tried nothing for the symptoms. The treatment provided no relief. Associated medical issues include CAD and heart failure.    Past Medical History:  Diagnosis Date  . Arthritis   . BARRETT'S ESOPHAGUS, HX OF   . Bladder disorder   . Bradycardia 08/2012   a. Eval for symptomatic bradycardia 08/2012 - beta blocker discontinued.  [name] BREAST MASS, BENIGN   . CAD (coronary artery disease)    a. s/p PCI to RCA '01. b. repeat PCI to RCA '03 2/2 ISR. c. cath 2012: RCA dz, rx medically.  Occluded RCA.  Nonobstructive disease elsewhere  . Chronic combined systolic and diastolic CHF (congestive heart failure) (HCC)    a. EF 40-45% in 2014. b. EF reported to be normal in 03/2015 nuc.  . CKD (chronic kidney disease), stage III   . DIVERTICULOSIS, COLON   . Epistaxis   . Esophageal reflux   . History of breast lump/mass excision 05/23/2009   Qualifier: History of  By: Lovell Sheehan MD, Balinda Quails   . HYPERLIPIDEMIA 03/07/2008  . HYPERTENSION 08/03/2007  . HYPOTHYROIDISM   . Internal hemorrhoids   . Interstitial cystitis   . LBBB (left bundle branch block)   . Orthostatic hypotension 08/2012    a. 08/2012 w/ syncope - meds adjusted.   [name] PAF (paroxysmal atrial fibrillation) (HCC)    a. Dx 09/2013.  . Right hand fracture     Patient Active Problem List   Diagnosis Date Noted  . Chronic anticoagulation 07/09/2017  . Left groin hernia 06/25/2017  . Osteopenia 06/28/2015  . Vitamin B12 deficiency 07/28/2014  . CKD (chronic kidney disease), stage III (HCC) 09/22/2013  . LBBB (left bundle branch block) 09/22/2013  . Diabetes mellitus II 09/22/2013  . Internal hemorrhoids   . Permanent atrial fibrillation (HCC) 09/21/2013  . H/O sinus bradycardia 08/10/2012  . Orthostatic hypotension 08/10/2012  . Fatigue 04/20/2012  . Diastolic HF (heart failure) (HCC) 11/17/2011  . ARTHRITIS 12/30/2010  . Left lumbar radiculopathy 12/11/2010  . Interstitial cystitis   . Hyperlipidemia 03/07/2008  . Esophageal reflux 10/18/2007  . Hypothyroidism 08/03/2007  . Essential hypertension 08/03/2007  . CAD S/P percutaneous coronary angioplasty 08/03/2007  . BARRETT'S ESOPHAGUS, HX OF 08/03/2007    Past Surgical History:  Procedure Laterality Date  . ABDOMINAL HYSTERECTOMY    . ANKLE RECONSTRUCTION     Left  . APPENDECTOMY    . BREAST SURGERY     benign mass  . CATARACT EXTRACTION, BILATERAL    . CESAREAN SECTION    . CHOLECYSTECTOMY    . TONSILLECTOMY    . WRIST RECONSTRUCTION     Right    OB  History    No data available       Home Medications    Prior to Admission medications   Medication Sig Start Date End Date Taking? Authorizing Provider  acetaminophen (TYLENOL) 500 MG tablet Take 500 mg by mouth every 6 (six) hours as needed for mild pain. Reported on 12/12/2015    [provider]  apixaban (ELIQUIS) 2.5 MG TABS tablet Take 1 tablet (2.5 mg total) by mouth 2 (two) times daily. 04/16/17   Rollene Rotunda, MD  diclofenac sodium (VOLTAREN) 1 % GEL Apply 2 g topically 4 (four) times daily. 06/25/17   Shelva Majestic, MD  furosemide (LASIX) 40 MG tablet TAKE 1.5  TABLETS (60 MG TOTAL) BY MOUTH DAILY. 12/31/16   Shelva Majestic, MD  isosorbide mononitrate (IMDUR) 60 MG 24 hr tablet TAKE 1 AND 1/2 TABLETS     (=90MG ) DAILY (KREMERS     URBAN MFR) 10/07/16   Shelva Majestic, MD  KLOR-CON 10 10 MEQ tablet TAKE 1 TABLET BY MOUTH DAILY 06/29/17   Shelva Majestic, MD  lansoprazole (PREVACID) 30 MG capsule TAKE 1 CAPSULE TWICE DAILY 08/20/17   Lars Masson, MD  levothyroxine (SYNTHROID) 50 MCG tablet Take 1 tablet (50 mcg total) by mouth daily before breakfast. 12/25/16   Shelva Majestic, MD  losartan (COZAAR) 50 MG tablet Take 1 tablet (50 mg total) by mouth daily. 07/09/17 10/07/17  Abelino Derrick, PA-C  metoprolol tartrate (LOPRESSOR) 25 MG tablet Take 0.5 tablets (12.5 mg total) by mouth 2 (two) times daily. TAKE 1/2 TABLET BY MOUTH AS NEEDED FOR PALPITATIONS 08/07/17   Corine Shelter K, PA-C  nitroGLYCERIN (NITROSTAT) 0.4 MG SL tablet ONE UNDER THE TONGUE EVERY 5 MINUTES AS NEEDED FOR CHEST PAIN. IF PAIN AFTER 3 PILLS, CALL 911 05/25/17   Shelva Majestic, MD  [facility] DELICA LANCETS 33G MISC Use to test blood sugars daily. Dx: E11.9 07/03/15   Shelva Majestic, MD  [facility] VERIO test strip USE TO TEST BLOOD SUGARS DAILY. DX: E11.9 08/25/16   Shelva Majestic, MD  pentosan polysulfate (ELMIRON) 100 MG capsule Take 100 mg by mouth 2 (two) times daily.     [provider]  pravastatin (PRAVACHOL) 40 MG tablet TAKE 1 TABLET DAILY 11/12/16   Shelva Majestic, MD    Family History Family History  Problem Relation Age of Onset  . Colon cancer Mother   . Coronary artery disease Mother   . Heart disease Mother   . Coronary artery disease Father   . Heart disease Father   . Colon polyps Sister   . Coronary artery disease Sister   . Diabetes type II Son   . Coronary artery disease Brother     Social History Social History   Tobacco Use  . Smoking status: Former Smoker    Packs/day: 1.00    Years: 15.00    Pack years: 15.00    Types:  Cigarettes    Last attempt to quit: 11/10/1969    Years since quitting: 47.8  . Smokeless tobacco: Never Used  Substance Use Topics  . Alcohol use: No    Alcohol/week: 0.0 oz  . Drug use: No     Allergies   Codeine and Sulfonamide derivatives   Review of Systems Review of Systems  Constitutional: Positive for fatigue. Negative for chills, diaphoresis and fever.  HENT: Positive for congestion. Negative for rhinorrhea.   Eyes: Negative for visual disturbance.  Respiratory: Positive for cough,  chest tightness and shortness of breath. Negative for hemoptysis, sputum production, wheezing and stridor.   Cardiovascular: Positive for leg swelling. Negative for chest pain and syncope.  Gastrointestinal: Negative for abdominal pain, constipation, diarrhea, nausea and vomiting.  Genitourinary: Negative for dysuria, flank pain and frequency.  Musculoskeletal: Negative for back pain, neck pain and neck stiffness.  Skin: Negative for rash and wound.  Neurological: Negative for dizziness, light-headedness and headaches.  Psychiatric/Behavioral: Negative for agitation.  All other systems reviewed and are negative.    Physical Exam Updated Vital Signs BP (!) 222/114   Pulse (!) 115   Resp (!) 30   SpO2 93%   Physical Exam  Constitutional: She is oriented to person, place, and time. She appears well-developed and well-nourished. She appears distressed.  HENT:  Head: Normocephalic.  Mouth/Throat: Oropharynx is clear and moist. No oropharyngeal exudate.  Eyes: Conjunctivae and EOM are normal. Pupils are equal, round, and reactive to light.  Neck: Normal range of motion. No JVD present.  Cardiovascular: Intact distal pulses. Tachycardia present.  No murmur heard. Pulmonary/Chest: Accessory muscle usage present. Tachypnea noted. She is in respiratory distress. She has rales. She exhibits no tenderness.  Abdominal: Soft. She exhibits no distension. There is no tenderness.  Musculoskeletal:  She exhibits edema. She exhibits no tenderness.  Neurological: She is alert and oriented to person, place, and time. No sensory deficit. She exhibits normal muscle tone.  Skin: Capillary refill takes less than 2 seconds. No rash noted. She is not diaphoretic. No erythema.  Psychiatric: She has a normal mood and affect.  Nursing note and vitals reviewed.    ED Treatments / Results  Labs (all labs ordered are listed, but only abnormal results are displayed) Labs Reviewed  BRAIN NATRIURETIC PEPTIDE - Abnormal; Notable for the following components:      Result Value   B Natriuretic Peptide 611.2 (*)    All other components within normal limits  COMPREHENSIVE METABOLIC PANEL - Abnormal; Notable for the following components:   Glucose, Bld 139 (*)    ALT 11 (*)    GFR calc non Af Amer 53 (*)    All other components within normal limits  CREATININE, SERUM - Abnormal; Notable for the following components:   GFR calc non Af Amer 53 (*)    All other components within normal limits  HEMOGLOBIN A1C - Abnormal; Notable for the following components:   Hgb A1c MFr Bld 6.3 (*)    All other components within normal limits  URINALYSIS, ROUTINE W REFLEX MICROSCOPIC - Abnormal; Notable for the following components:   Color, Urine COLORLESS (*)    Specific Gravity, Urine 1.004 (*)    Hgb urine dipstick SMALL (*)    Bacteria, UA RARE (*)    All other components within normal limits  GLUCOSE, CAPILLARY - Abnormal; Notable for the following components:   Glucose-Capillary 144 (*)    All other components within normal limits  GLUCOSE, CAPILLARY - Abnormal; Notable for the following components:   Glucose-Capillary 113 (*)    All other components within normal limits  I-STAT TROPONIN, ED - Abnormal; Notable for the following components:   Troponin i, poc 0.14 (*)    All other components within normal limits  CBG MONITORING, ED - Abnormal; Notable for the following components:   Glucose-Capillary 112  (*)    All other components within normal limits  URINE CULTURE  MRSA PCR SCREENING  MAGNESIUM  CBC  LIPASE, BLOOD  CBC  BASIC METABOLIC  PANEL  TSH  I-STAT TROPONIN, ED    EKG  EKG Interpretation  Date/Time:  Sunday September 13 2017 08:34:14 EST Ventricular Rate:  88 PR Interval:    QRS Duration: 130 QT Interval:  408 QTC Calculation: 494 R Axis:   -90 Text Interpretation:  Atrial fibrillation Nonspecific IVCD with LAD Left ventricular hypertrophy Inferior infarct, old Anterior infarct, old Baseline wander in lead(s) V2 When compared to prior, simialr Afib with LBBB.  No STEMI Confirmed by Victoria Gomez ([id number]) on 09/13/2017 8:36:43 AM       Radiology Dg Chest Portable 1 View  Result Date: 09/13/2017 CLINICAL DATA:  83 year old female with shortness of breath and abnormal pulmonary auscultation. Recent cough. EXAM: PORTABLE CHEST 1 VIEW COMPARISON:  05/10/2014 and earlier. FINDINGS: Portable AP semi upright view at 0812 hours. Diffuse increased pulmonary interstitial opacity, somewhat confluent at the lung bases. Borderline to mild cardiomegaly is chronic. Other mediastinal contours are within normal limits. Visualized tracheal air column is within normal limits. No pneumothorax. No definite pleural effusion. IMPRESSION: Diffuse bilateral increased pulmonary interstitial opacity. Favor acute viral/atypical pneumonia in this clinical setting with main differential consideration of acute interstitial edema. Electronically Signed   By: Odessa Fleming M.D.   On: 09/13/2017 09:07    Procedures Procedures (including critical care time)  CRITICAL CARE Performed by: Canary Brim Tegeler Total critical care time: 45 minutes Critical care time was exclusive of separately billable procedures and treating other patients. Critical care was necessary to treat or prevent imminent or life-threatening deterioration. Critical care was time spent personally by me on the following activities:  development of treatment plan with patient and/or surrogate as well as nursing, discussions with consultants, evaluation of patient's response to treatment, examination of patient, obtaining history from patient or surrogate, ordering and performing treatments and interventions, ordering and review of laboratory studies, ordering and review of radiographic studies, pulse oximetry and re-evaluation of patient's condition.   Medications Ordered in ED Medications  furosemide (LASIX) injection 40 mg (not administered)  nitroGLYCERIN 50 mg in dextrose 5 % 250 mL (0.2 mg/mL) infusion (not administered)     Initial Impression / Assessment and Plan / ED Course  I have reviewed the triage vital signs and the nursing notes.  Pertinent labs & imaging results that were available during my care of the patient were reviewed by me and considered in my medical decision making (see chart for details).     TRENYCE LOERA is a 83 y.o. female with a past medical history significant for CAD status post PCI, CHF, atrial fibrillation on Eliquis, hyper lipidemia, hypertension, CKD, and thyroid disease who presents with hypertension and shortness of breath.  Patient is brought in by EMS due to worsening respiratory status.  During transfer with EMS, patient blood pressure was in the 230s systolic.  Patient also had worsening respiratory status.  Upon arrival, patient was tachypneic in the upper 30s and lower 40s.  Patient had severe crackles and rales in all lung fields.  Patient's blood pressure was in the 220s-230s.   Hypoxia, respiratory distress, elevated blood pressure, his CHF history, there is clinical concern for flash pulmonary edema and hypertensive emergency with CHF exacerbation.  Patient started on a nitroglycerin drip and BiPAP was initiated.  Patient's respiratory status drastically improved with both interventions.  Patient's blood pressure began to improve.  Patient was able to answer questions slightly  better after interventions began.  Patient was initially speaking in one-word answers.  Patient denies fevers  or chills but does report a dry cough for the last few days.  She denies chest pain or abdominal pain.  She denies any dysuria but does report some mild frequency.  She denies any constipation or diarrhea.    Laboratory testing began to return showing elevated BNP of 611.  Initial troponin negative however, repeat troponin elevated.  Suspect demand ischemia in the setting of the hypertensive emergency and CHF exacerbation.  Lipase not elevated.  Metabolic panel reassuring.  CBC shows no anemia and no leukocytosis.  Cardiology was called due to concern of CHF exacerbation.  Initial EKG showed a left bundle branch block in the setting of fast atrial fibrillation.  They did not feel she was having an acute MI.  Given improvement in symptoms, I feel patient is now ready for admission.  Hospitalist team called for admission who will admit patient for further management of CHF exacerbation and flash pulmonary edema with hypertensive emergency.    Final Clinical Impressions(s) / ED Diagnoses   Final diagnoses:  Acute on chronic congestive heart failure, unspecified heart failure type (HCC)  Shortness of breath  Hypertensive emergency    Clinical Impression: 1. Acute on chronic congestive heart failure, unspecified heart failure type (HCC)   2. Shortness of breath   3. Hypertensive emergency     Disposition: Admit to hospitalist service    Tegeler, Canary Brimhristopher J, MD 09/13/17 2005

## 2017-09-13 NOTE — Consult Note (Addendum)
Primary cardiologist: Dr Percival Spanish  HPI: 83 year old female for evaluation of acute on chronic combined systolic/diastolic congestive heart failure at request of Randa Spike MD. Patient with history of left bundle branch block, hypertension, hyperlipidemia, combined systolic/diastolic congestive heart failure, coronary disease and permanent atrial fibrillation. She has a history of remote PCI of RCA but at last catheterization 2012 was noted to be occluded. Residual 40% LAD. Also with history of permanent atrial fibrillation. Her blood pressure medications were decreased recently because of dizziness. Patient complains of 2 days of increasing dyspnea at rest. There is orthopnea, PND and mild increased pedal edema. She notes increased cough with lying flat. Productive of whitish sputum. She has a pain under the left breast that increases with lying flat. She denies fevers, chills, hemoptysis. She presented to the emergency room and cardiology asked to evaluate. Note patient's systolic blood pressure was in the 220 to 230 range in route to the emergency room.    (Not in a hospital admission)  Allergies  Allergen Reactions  . Codeine Hives and Nausea And Vomiting  . Sulfonamide Derivatives Swelling    Turns red     Past Medical History:  Diagnosis Date  . Arthritis   . BARRETT'S ESOPHAGUS, HX OF   . Bladder disorder   . Bradycardia 08/2012   a. Eval for symptomatic bradycardia 08/2012 - beta blocker discontinued.  [name] BREAST MASS, BENIGN   . CAD (coronary artery disease)    a. s/p PCI to RCA '01. b. repeat PCI to RCA '03 2/2 ISR. c. cath 2012: RCA dz, rx medically.  Occluded RCA.  Nonobstructive disease elsewhere  . Chronic combined systolic and diastolic CHF (congestive heart failure) ([place])    a. EF 40-45% in 2014. b. EF reported to be normal in 03/2015 nuc.  . CKD (chronic kidney disease), stage III ([place])   . DIVERTICULOSIS, COLON   . Epistaxis   . Esophageal reflux   . History of  breast lump/mass excision 05/23/2009   Qualifier: History of  By: Arnoldo Morale MD, Balinda Quails   . HYPERLIPIDEMIA 03/07/2008  . HYPERTENSION 08/03/2007  . HYPOTHYROIDISM   . Internal hemorrhoids   . Interstitial cystitis   . LBBB (left bundle branch block)   . Orthostatic hypotension 08/2012   a. 08/2012 w/ syncope - meds adjusted.   [name] PAF (paroxysmal atrial fibrillation) ([place])    a. Dx 09/2013.  . Right hand fracture     Past Surgical History:  Procedure Laterality Date  . ABDOMINAL HYSTERECTOMY    . ANKLE RECONSTRUCTION     Left  . APPENDECTOMY    . BREAST SURGERY     benign mass  . CATARACT EXTRACTION, BILATERAL    . CESAREAN SECTION    . CHOLECYSTECTOMY    . TONSILLECTOMY    . WRIST RECONSTRUCTION     Right    Social History   Socioeconomic History  . Marital status: Widowed    Spouse name: Not on file  . Number of children: 3  . Years of education: Not on file  . Highest education level: Not on file  Social Needs  . Financial resource strain: Not on file  . Food insecurity - worry: Not on file  . Food insecurity - inability: Not on file  . Transportation needs - medical: Not on file  . Transportation needs - non-medical: Not on file  Occupational History  . Occupation: retired  Tobacco Use  . Smoking status: Former Smoker  Packs/day: 1.00    Years: 15.00    Pack years: 15.00    Types: Cigarettes    Last attempt to quit: 11/10/1969    Years since quitting: 47.8  . Smokeless tobacco: Never Used  Substance and Sexual Activity  . Alcohol use: No    Alcohol/week: 0.0 oz  . Drug use: No  . Sexual activity: No    Birth control/protection: Post-menopausal  Other Topics Concern  . Not on file  Social History Narrative   Widowed 05/2013. 2nd marriage. First husband died of cancer. 3 sons-William Brooke Bonito- 2 sons, 2 twin girls, [name]-1 son, no grandsons. [name]-1 son, 1 daughter, no grandkids.       LIves with youngest son (has cancer). Son drives her but has license.  Independent IADLs.       Retired from [employer] after 33 years.       Hobbies: crocheting, used to bowl with husband, reading      Living will: currently wants resuscitation.     Family History  Problem Relation Age of Onset  . Colon cancer Mother   . Coronary artery disease Mother   . Heart disease Mother   . Coronary artery disease Father   . Heart disease Father   . Colon polyps Sister   . Coronary artery disease Sister   . Diabetes type II Son   . Coronary artery disease Brother     ROS:  no fevers or chills, hemoptysis, dysphasia, odynophagia, melena, hematochezia, dysuria, hematuria, rash, seizure activity, claudication. Remaining systems are negative.  Physical Exam:   Blood pressure (!) 146/90, pulse 93, resp. rate (!) 22, SpO2 100 %.  General:  Well developed/well nourished in NAD Skin warm/dry Patient not depressed No peripheral clubbing Back-normal HEENT-normal/normal eyelids Neck supple/normal carotid upstroke bilaterally; no bruits; no JVD; no thyromegaly chest - basilar crackles noted  CV - irregular/normal S1 and S2; no murmurs, rubs or gallops;  PMI nondisplaced Abdomen -NT/ND, no HSM, no mass, + bowel sounds, no bruit 2+ femoral pulses, no bruits Ext-trace edema, no chords, 2+ DP, varicosities noted Neuro-grossly nonfocal  ECG - atrial fibrillation, left bundle branch block. personally reviewed  Results for orders placed or performed during the hospital encounter of 09/13/17 (from the past 48 hour(s))  Magnesium     Status: None   Collection Time: 09/13/17  8:20 AM  Result Value Ref Range   Magnesium 1.9 1.7 - 2.4 mg/dL  Brain natriuretic peptide (order ONLY if patient c/o SOB)     Status: Abnormal   Collection Time: 09/13/17  8:20 AM  Result Value Ref Range   B Natriuretic Peptide 611.2 (H) 0.0 - 100.0 pg/mL  CBC     Status: None   Collection Time: 09/13/17  8:20 AM  Result Value Ref Range   WBC 9.1 4.0 - 10.5 K/uL   RBC 4.38 3.87 - 5.11 MIL/uL     Hemoglobin 12.5 12.0 - 15.0 g/dL   HCT 38.9 36.0 - 46.0 %   MCV 88.8 78.0 - 100.0 fL   MCH 28.5 26.0 - 34.0 pg   MCHC 32.1 30.0 - 36.0 g/dL   RDW 13.7 11.5 - 15.5 %   Platelets 225 150 - 400 K/uL  Comprehensive metabolic panel     Status: Abnormal   Collection Time: 09/13/17  8:20 AM  Result Value Ref Range   Sodium 138 135 - 145 mmol/L   Potassium 4.4 3.5 - 5.1 mmol/L   Chloride 110 101 - 111 mmol/L  CO2 22 22 - 32 mmol/L   Glucose, Bld 139 (H) 65 - 99 mg/dL   BUN 13 6 - 20 mg/dL   Creatinine, Ser 0.93 0.44 - 1.00 mg/dL   Calcium 9.1 8.9 - 10.3 mg/dL   Total Protein 7.0 6.5 - 8.1 g/dL   Albumin 3.6 3.5 - 5.0 g/dL   AST 21 15 - 41 U/L   ALT 11 (L) 14 - 54 U/L   Alkaline Phosphatase 80 38 - 126 U/L   Total Bilirubin 0.4 0.3 - 1.2 mg/dL   GFR calc non Af Amer 53 (L) >60 mL/min   GFR calc Af Amer >60 >60 mL/min    Comment: (NOTE) The eGFR has been calculated using the CKD EPI equation. This calculation has not been validated in all clinical situations. eGFR's persistently <60 mL/min signify possible Chronic Kidney Disease.    Anion gap 6 5 - 15  I-stat troponin, ED     Status: None   Collection Time: 09/13/17  9:00 AM  Result Value Ref Range   Troponin i, poc 0.01 0.00 - 0.08 ng/mL   Comment 3            Comment: Due to the release kinetics of cTnI, a negative result within the first hours of the onset of symptoms does not rule out myocardial infarction with certainty. If myocardial infarction is still suspected, repeat the test at appropriate intervals.   Lipase, blood     Status: None   Collection Time: 09/13/17  9:57 AM  Result Value Ref Range   Lipase 26 11 - 51 U/L  CBC     Status: None   Collection Time: 09/13/17 11:12 AM  Result Value Ref Range   WBC 7.9 4.0 - 10.5 K/uL   RBC 4.44 3.87 - 5.11 MIL/uL   Hemoglobin 12.8 12.0 - 15.0 g/dL   HCT 39.2 36.0 - 46.0 %   MCV 88.3 78.0 - 100.0 fL   MCH 28.8 26.0 - 34.0 pg   MCHC 32.7 30.0 - 36.0 g/dL   RDW 13.2  11.5 - 15.5 %   Platelets 193 150 - 400 K/uL  Creatinine, serum     Status: Abnormal   Collection Time: 09/13/17 11:12 AM  Result Value Ref Range   Creatinine, Ser 0.93 0.44 - 1.00 mg/dL   GFR calc non Af Amer 53 (L) >60 mL/min   GFR calc Af Amer >60 >60 mL/min    Comment: (NOTE) The eGFR has been calculated using the CKD EPI equation. This calculation has not been validated in all clinical situations. eGFR's persistently <60 mL/min signify possible Chronic Kidney Disease.   Hemoglobin A1c     Status: Abnormal   Collection Time: 09/13/17 11:12 AM  Result Value Ref Range   Hgb A1c MFr Bld 6.3 (H) 4.8 - 5.6 %    Comment: (NOTE) Pre diabetes:          5.7%-6.4% Diabetes:              >6.4% Glycemic control for   <7.0% adults with diabetes    Mean Plasma Glucose 134.11 mg/dL  I-stat troponin, ED     Status: Abnormal   Collection Time: 09/13/17 11:31 AM  Result Value Ref Range   Troponin i, poc 0.14 (HH) 0.00 - 0.08 ng/mL   Comment NOTIFIED PHYSICIAN    Comment 3            Comment: Due to the release kinetics of cTnI, a negative result  within the first hours of the onset of symptoms does not rule out myocardial infarction with certainty. If myocardial infarction is still suspected, repeat the test at appropriate intervals.   Urinalysis, Routine w reflex microscopic     Status: Abnormal   Collection Time: 09/13/17 12:04 PM  Result Value Ref Range   Color, Urine COLORLESS (A) YELLOW   APPearance CLEAR CLEAR   Specific Gravity, Urine 1.004 (L) 1.005 - 1.030   pH 6.0 5.0 - 8.0   Glucose, UA NEGATIVE NEGATIVE mg/dL   Hgb urine dipstick SMALL (A) NEGATIVE   Bilirubin Urine NEGATIVE NEGATIVE   Ketones, ur NEGATIVE NEGATIVE mg/dL   Protein, ur NEGATIVE NEGATIVE mg/dL   Nitrite NEGATIVE NEGATIVE   Leukocytes, UA NEGATIVE NEGATIVE   RBC / HPF 0-5 0 - 5 RBC/hpf   WBC, UA 0-5 0 - 5 WBC/hpf   Bacteria, UA RARE (A) NONE SEEN   Squamous Epithelial / LPF NONE SEEN NONE SEEN  CBG  monitoring, ED     Status: Abnormal   Collection Time: 09/13/17 12:59 PM  Result Value Ref Range   Glucose-Capillary 112 (H) 65 - 99 mg/dL    Dg Chest Portable 1 View  Result Date: 09/13/2017 CLINICAL DATA:  83 year old female with shortness of breath and abnormal pulmonary auscultation. Recent cough. EXAM: PORTABLE CHEST 1 VIEW COMPARISON:  05/10/2014 and earlier. FINDINGS: Portable AP semi upright view at 0812 hours. Diffuse increased pulmonary interstitial opacity, somewhat confluent at the lung bases. Borderline to mild cardiomegaly is chronic. Other mediastinal contours are within normal limits. Visualized tracheal air column is within normal limits. No pneumothorax. No definite pleural effusion. IMPRESSION: Diffuse bilateral increased pulmonary interstitial opacity. Favor acute viral/atypical pneumonia in this clinical setting with main differential consideration of acute interstitial edema. Electronically Signed   By: Genevie Ann M.D.   On: 09/13/2017 09:07    Assessment/Plan 1 acute on chronic combined systolic/diastolic congestive heart failure-patient presents with CHF symptoms and chest x-ray with probable edema. Also with mildly elevated BNP. Would diurese with Lasix 40 mg IV twice a day. Follow renal function closely. Repeat echocardiogram.   2 hypertension-blood pressure was elevated in route to the emergency room. There has been some improvement since arrival. Would continue preadmission dose of ARB and beta blocker. Follow blood pressure and adjust regimen as needed.  3 permanent atrial fibrillation-continue low-dose metoprolol for rate control. CHADSvasc 6. Continue apixaban. Will await weight but appropriate dose may be 5 mg twice a day.  4 coronary artery disease-continue statin. No aspirin given need for anticoagulation.  5 minimally elevated troponin-likely secondary to CHF. Would cycle and if no clear trend would not pursue further ischemia evaluation as patient is not having  chest pain.   6 hyperlipidemia-continue statin.     Kirk Ruths MD 09/13/2017, 1:46 PM

## 2017-09-13 NOTE — Progress Notes (Signed)
Placed a page with [facility] re: Robitussin ineffective in relieving patient's cough. She was having a coughing spell that was making her sick to her stomach, its; a strong, dry, hacky, non-productive cough that patient states she has been having for awhile, will also see if she can order something for her to rest. Pt given detailed instructions and asked her to reiterate what was told to her because this is the second time that she has tried to get OOB without calling, bed alarm placed on and will place 3 siderails up to help her remember to call for help.

## 2017-09-13 NOTE — ED Notes (Signed)
Lunch tray ordered; 2 gram sodium diet 

## 2017-09-13 NOTE — Plan of Care (Signed)
Pt oriented to room, callbell, unit, and plan of care.

## 2017-09-13 NOTE — ED Notes (Signed)
Pt placed on bipap by RT

## 2017-09-13 NOTE — ED Notes (Signed)
Cardiology at bedside.

## 2017-09-13 NOTE — H&P (Signed)
History and Physical    Victoria Gomez:[id number] DOB: 05/15/1925 DOA: 09/13/2017  PCP: Shelva Majestic, MD  Cardiologist: Dr. Antoine Poche  Patient coming from: Home via EMS  I have personally briefly reviewed patient's old medical records in [facility]  Chief Complaint: Acute shortness of breath this morning  HPI: Victoria Gomez is a 83 y.o. female with medical history significant for coronary artery disease status post percutaneous coronary intervention, severe hypertension, hyperlipidemia, hypothyroidism, combined systolic and diastolic congestive heart failure, permanent atrial fibrillation on Eliquis, and diabetes type 2 who presents from home with complaints of acute shortness of breath. Upon arrival of EMS patient was unresponsive with audible rails in her breathing. It had a cough for a few days and was in extremis on presentation and immediately placed on BiPAP. EMS reports that her blood pressure was 2:30 systolic during transport and her respiratory status declined during transport as well. Upon arrival she was tachypneic to the upper 30s and lower 40s with severe crackles and rales in all lung fields. There was significant concern for flash pulmonary edema and hypertensive emergency with CHF exacerbation. She was started on BiPAP and a nitroglycerin drip. Her respiratory status drastically improved with those interventions. As did her blood pressure.  Patient became more alert. And was better able to answer questions after those interventions. Initially she had been speaking in one-word phrases. She denies dysuria but has some mild frequency denied any constipation or diarrhea had no fevers or chills but did have a dry cough for the past few days.  ED Course: Patient initially assessed placed on BiPAP given IV Lasix and nitroglycerin drip. BNP came back elevated at 611. Initial troponin was negative but then repeat troponin elevated. Demand ischemia in the setting of  hypertensive emergency and CHF exacerbation. Cardiology was consult it. Patient improved and was transferred to my care for admission to the hospital.   Review of Systems  Constitutional: Positive for diaphoresis. Negative for chills and fever.  HENT: Negative for congestion, hearing loss and sinus pain.   Eyes: Negative for blurred vision and double vision.  Respiratory: Positive for shortness of breath. Negative for cough and wheezing.   Cardiovascular: Positive for orthopnea and PND. Negative for chest pain, palpitations and leg swelling.  Gastrointestinal: Negative for heartburn, nausea and vomiting.  Genitourinary: Negative for dysuria and urgency.  Musculoskeletal: Negative for myalgias and neck pain.  Skin: Negative for itching and rash.  Neurological: Negative for dizziness, tingling and headaches.  Endo/Heme/Allergies: Negative for environmental allergies. Does not bruise/bleed easily.  Psychiatric/Behavioral: Negative for depression and suicidal ideas.     Past Medical History:  Diagnosis Date  . Arthritis   . Atrial fibrillation (HCC)    a. Dx 09/2013.  [name] BARRETT'S ESOPHAGUS, HX OF   . Bladder disorder   . Bradycardia 08/2012   a. Eval for symptomatic bradycardia 08/2012 - beta blocker discontinued.  [name] BREAST MASS, BENIGN   . CAD (coronary artery disease)    a. s/p PCI to RCA '01. b. repeat PCI to RCA '03 2/2 ISR. c. cath 2012: RCA dz, rx medically.  Occluded RCA.  Nonobstructive disease elsewhere  . Chronic combined systolic and diastolic CHF (congestive heart failure) (HCC)    a. EF 40-45% in 2014. b. EF reported to be normal in 03/2015 nuc.  . CKD (chronic kidney disease), stage III (HCC)   . DIVERTICULOSIS, COLON   . Epistaxis   . Esophageal reflux   . History of  breast lump/mass excision 05/23/2009   Qualifier: History of  By: Lovell  MD, Balinda Quails   . HYPERLIPIDEMIA 03/07/2008  . HYPERTENSION 08/03/2007  . HYPOTHYROIDISM   . Internal hemorrhoids   . Interstitial  cystitis   . LBBB (left bundle branch block)   . Orthostatic hypotension 08/2012   a. 08/2012 w/ syncope - meds adjusted.   . Right hand fracture     Past Surgical History:  Procedure Laterality Date  . ABDOMINAL HYSTERECTOMY    . ANKLE RECONSTRUCTION     Left  . APPENDECTOMY    . BREAST SURGERY     benign mass  . CATARACT EXTRACTION, BILATERAL    . CESAREAN SECTION    . CHOLECYSTECTOMY    . TONSILLECTOMY    . WRIST RECONSTRUCTION     Right     reports that she quit smoking about 47 years ago. Her smoking use included cigarettes. She has a 15.00 pack-year smoking history. she has never used smokeless tobacco. She reports that she does not drink alcohol or use drugs.  Allergies  Allergen Reactions  . Codeine Hives and Nausea And Vomiting  . Sulfonamide Derivatives Swelling    Turns red    Family History  Problem Relation Age of Onset  . Colon cancer Mother   . Coronary artery disease Mother   . Heart disease Mother   . Coronary artery disease Father   . Heart disease Father   . Colon polyps Sister   . Coronary artery disease Sister   . Diabetes type II Son   . Coronary artery disease Brother      Prior to Admission medications   Medication Sig Start Date End Date Taking? Authorizing Provider  acetaminophen (TYLENOL) 500 MG tablet Take 500 mg by mouth every 6 (six) hours as needed for mild pain. Reported on 12/12/2015   Yes [provider]  apixaban (ELIQUIS) 2.5 MG TABS tablet Take 1 tablet (2.5 mg total) by mouth 2 (two) times daily. 04/16/17  Yes Rollene Rotunda, MD  diclofenac sodium (VOLTAREN) 1 % GEL Apply 2 g topically 4 (four) times daily. 06/25/17  Yes Shelva Majestic, MD  furosemide (LASIX) 40 MG tablet TAKE 1.5 TABLETS (60 MG TOTAL) BY MOUTH DAILY. 12/31/16  Yes Shelva Majestic, MD  isosorbide mononitrate (IMDUR) 60 MG 24 hr tablet TAKE 1 AND 1/2 TABLETS     (=90MG ) DAILY (KREMERS     URBAN MFR) 10/07/16  Yes Shelva Majestic, MD  KLOR-CON 10 10  MEQ tablet TAKE 1 TABLET BY MOUTH DAILY 06/29/17  Yes Shelva Majestic, MD  lansoprazole (PREVACID) 30 MG capsule TAKE 1 CAPSULE TWICE DAILY 08/20/17  Yes Lars Masson, MD  levothyroxine (SYNTHROID) 50 MCG tablet Take 1 tablet (50 mcg total) by mouth daily before breakfast. 12/25/16  Yes Shelva Majestic, MD  losartan (COZAAR) 50 MG tablet Take 1 tablet (50 mg total) by mouth daily. 07/09/17 10/07/17 Yes Kilroy, Luke K, PA-C  metoprolol tartrate (LOPRESSOR) 25 MG tablet Take 0.5 tablets (12.5 mg total) by mouth 2 (two) times daily. TAKE 1/2 TABLET BY MOUTH AS NEEDED FOR PALPITATIONS 08/07/17  Yes Kilroy, Luke K, PA-C  nitroGLYCERIN (NITROSTAT) 0.4 MG SL tablet ONE UNDER THE TONGUE EVERY 5 MINUTES AS NEEDED FOR CHEST PAIN. IF PAIN AFTER 3 PILLS, CALL 911 05/25/17  Yes Shelva Majestic, MD  pentosan polysulfate (ELMIRON) 100 MG capsule Take 100 mg by mouth 2 (two) times daily.    Yes [provider]  pravastatin (PRAVACHOL) 40 MG tablet TAKE 1 TABLET DAILY 11/12/16  Yes Shelva Majestic, MD  [facility] DELICA LANCETS 33G MISC Use to test blood sugars daily. Dx: E11.9 07/03/15   Shelva Majestic, MD  [facility] VERIO test strip USE TO TEST BLOOD SUGARS DAILY. DX: E11.9 08/25/16   Shelva Majestic, MD    Physical Exam: Vitals:   09/13/17 1330 09/13/17 1400 09/13/17 1430 09/13/17 1611  BP: (!) 146/90 136/71 (!) 149/98 (!) 154/83  Pulse: 93 95 98 (!) 106  Resp: (!) 22 (!) 30 (!) 26   SpO2: 100% 99% 98% 94%    Constitutional: NAD, calm, comfortable Vitals:   09/13/17 1330 09/13/17 1400 09/13/17 1430 09/13/17 1611  BP: (!) 146/90 136/71 (!) 149/98 (!) 154/83  Pulse: 93 95 98 (!) 106  Resp: (!) 22 (!) 30 (!) 26   SpO2: 100% 99% 98% 94%   Eyes: PERRL, lids and conjunctivae normal ENMT: Mucous membranes are dry on the BiPAP. Posterior pharynx clear of any exudate or lesions.Normal dentition.  Neck: normal, supple, no masses, no thyromegaly Respiratory: Coarse breath sounds with  significant rales up both lung fields and no wheezing or rhonchi. Increased respiratory effort. There is accessory muscle use noted. Cardiovascular: Tachycardic rate and rhythm, no murmurs / rubs / gallops. Trace edema. 2+ pedal pulses. No carotid bruits.  Abdomen: no tenderness, no masses palpated. No hepatosplenomegaly. Bowel sounds positive.  Musculoskeletal: no clubbing / cyanosis. No joint deformity upper and lower extremities. Good ROM, no contractures. Normal muscle tone.  Skin: no rashes, lesions, ulcers. No induration Neurologic: CN 2-12 grossly intact. Sensation intact, DTR normal. Strength 5/5 in all 4.  Psychiatric: Normal judgment and insight. Alert and oriented x 3. Normal mood.     Labs on Admission: I have personally reviewed following labs and imaging studies  CBC: Recent Labs  Lab 09/13/17 0820 09/13/17 1112  WBC 9.1 7.9  HGB 12.5 12.8  HCT 38.9 39.2  MCV 88.8 88.3  PLT 225 193   Basic Metabolic Panel: Recent Labs  Lab 09/13/17 0820 09/13/17 1112  NA 138  --   K 4.4  --   CL 110  --   CO2 22  --   GLUCOSE 139*  --   BUN 13  --   CREATININE 0.93 0.93  CALCIUM 9.1  --   MG 1.9  --    GFR: CrCl cannot be calculated (Unknown ideal weight.). Liver Function Tests: Recent Labs  Lab 09/13/17 0820  AST 21  ALT 11*  ALKPHOS 80  BILITOT 0.4  PROT 7.0  ALBUMIN 3.6   Recent Labs  Lab 09/13/17 0957  LIPASE 26   No results for input(s): AMMONIA in the last 168 hours. Coagulation Profile: No results for input(s): INR, PROTIME in the last 168 hours. Cardiac Enzymes: No results for input(s): CKTOTAL, CKMB, CKMBINDEX, TROPONINI in the last 168 hours. BNP (last 3 results) No results for input(s): PROBNP in the last 8760 hours. HbA1C: Recent Labs    09/13/17 1112  HGBA1C 6.3*   CBG: Recent Labs  Lab 09/13/17 1259 09/13/17 1645  GLUCAP 112* 144*   Lipid Profile: No results for input(s): CHOL, HDL, LDLCALC, TRIG, CHOLHDL, LDLDIRECT in the last  72 hours. Thyroid Function Tests: No results for input(s): TSH, T4TOTAL, FREET4, T3FREE, THYROIDAB in the last 72 hours. Anemia Panel: No results for input(s): VITAMINB12, FOLATE, FERRITIN, TIBC, IRON, RETICCTPCT in the last 72 hours. Urine analysis:    Component Value  Date/Time   COLORURINE COLORLESS (A) 09/13/2017 1204   APPEARANCEUR CLEAR 09/13/2017 1204   LABSPEC 1.004 (L) 09/13/2017 1204   PHURINE 6.0 09/13/2017 1204   GLUCOSEU NEGATIVE 09/13/2017 1204   HGBUR SMALL (A) 09/13/2017 1204   BILIRUBINUR NEGATIVE 09/13/2017 1204   KETONESUR NEGATIVE 09/13/2017 1204   PROTEINUR NEGATIVE 09/13/2017 1204   UROBILINOGEN 0.2 05/10/2014 0705   NITRITE NEGATIVE 09/13/2017 1204   LEUKOCYTESUR NEGATIVE 09/13/2017 1204    Radiological Exams on Admission: Dg Chest Portable 1 View  Result Date: 09/13/2017 CLINICAL DATA:  83 year old female with shortness of breath and abnormal pulmonary auscultation. Recent cough. EXAM: PORTABLE CHEST 1 VIEW COMPARISON:  05/10/2014 and earlier. FINDINGS: Portable AP semi upright view at 0812 hours. Diffuse increased pulmonary interstitial opacity, somewhat confluent at the lung bases. Borderline to mild cardiomegaly is chronic. Other mediastinal contours are within normal limits. Visualized tracheal air column is within normal limits. No pneumothorax. No definite pleural effusion. IMPRESSION: Diffuse bilateral increased pulmonary interstitial opacity. Favor acute viral/atypical pneumonia in this clinical setting with main differential consideration of acute interstitial edema. Electronically Signed   By: Odessa Fleming M.D.   On: 09/13/2017 09:07    EKG: Independently reviewed. Left bundle branch block with normal axes and intervals  Assessment/Plan Principal Problem:   Acute respiratory failure with hypoxemia (HCC) Active Problems:   Acute on chronic combined systolic and diastolic CHF (congestive heart failure) (HCC)   Hypertensive heart and kidney disease with  acute on chronic combined systolic and diastolic congestive heart failure and stage 3 chronic kidney disease (HCC)   Permanent atrial fibrillation (HCC)   CKD (chronic kidney disease), stage III (HCC)   Diabetes mellitus II   Hypothyroidism   Hyperlipidemia   1. Acute respiratory failure with hypoxemia: Patient admitted in extremis requiring BiPAP treatment for flash pulmonary edema and hypertensive emergency. She was given a nitroglycerin drip BiPAP treatment and Lasix in the emergency department. She has had a brisk diuresis while in the emergency department and by the time I saw her was able to speak in sentences but still requiring BiPAP. Given her high flow oxygen requirement and BiPAP requirement she'll be admitted to the stepdown unit. We'll monitor her BiPAP use and try to wean off as possible.  2. Acute on chronic combined systolic and diastolic congestive heart failure: Last echocardiogram in our facilities in 2014 which showed an ejection fraction of 40-45% with combined systolic and diastolic facets. Patient is followed by Dr. Sander Radon from cardiology. Will consult cardiology for further assistance. Continue nitroglycerin drip for now. We'll also continue IV Lasix, Imdur, losartan, metoprolol, aspirin and statin.  3. Hypertensive heart and kidney disease with acute on chronic combined systolic and diastolic congestive heart failure and stage III chronic kidney disease: Acute exacerbation likely brought on by hypertensive emergency management as above.  4. Permanent atrial fibrillation: Continue Eliquis and rate control with beta blocker.  5. Chronic kidney disease stage III: Acute worsening in kidney function would be expected our patient's renal function appears to be stable at this point. Continue to avoid nephrotoxic agents.  6. Diabetes mellitus type 2: Glucose slightly elevated. May be due to acute phase reaction due to extremity. We'll monitor fingerstick blood glucoses. Will use  basal blood glucose monitoring protocol. Cover with sliding scale.  7. Hypothyroidism: Continue levothyroxine check TSH in a.m.  8. Hyperlipidemia: Continue atorvastatin.   DVT prophylaxis: Eliquis Code Status: Full code (discussion with patient and son on admission) Family Communication: Spoke at [employer]  with patient and her son Elwyn ReachRichard Carter, on admission. Patient alert and remains able to make decisions for herself at the present time. Disposition Plan: Discharge hopefully in 2-3 days home versus rehabilitation facility. Consults called: Cardiology Dr. Mayford Knifeurner Admission status: Inpatient   Lahoma Crockerheresa C Aneta Hendershott MD FACP [facility] Pager [phone]  If 7PM-7AM, please contact night-coverage [email or web address] Password [email or web address]  09/13/2017, 5:14 PM

## 2017-09-13 NOTE — ED Triage Notes (Addendum)
Per EMS- lung sounds clear upon initial. assessment, pt unresponsive upon arrival. Pt has multuple complaints of afib/hypertension. Pt got worse upon arrival to ER.   Upon assessment RALES to lungs. Pt has audible rales with breathing. Pt in acute distress. PT reports cough for past several days. Denies fever or urinary symptoms. MD brought to room upon arrival

## 2017-09-14 ENCOUNTER — Inpatient Hospital Stay (HOSPITAL_COMMUNITY): Payer: Medicare Other

## 2017-09-14 DIAGNOSIS — J9601 Acute respiratory failure with hypoxia: Secondary | ICD-10-CM

## 2017-09-14 DIAGNOSIS — I509 Heart failure, unspecified: Secondary | ICD-10-CM

## 2017-09-14 LAB — GLUCOSE, CAPILLARY
GLUCOSE-CAPILLARY: 144 mg/dL — AB (ref 65–99)
GLUCOSE-CAPILLARY: 119 mg/dL — AB (ref 65–99)
Glucose-Capillary: 125 mg/dL — ABNORMAL HIGH (ref 65–99)
Glucose-Capillary: 138 mg/dL — ABNORMAL HIGH (ref 65–99)

## 2017-09-14 LAB — TROPONIN I: Troponin I: 0.18 ng/mL (ref <0.03)

## 2017-09-14 LAB — BASIC METABOLIC PANEL
Anion gap: 13 (ref 5–15)
BUN: 18 mg/dL (ref 6–20)
CHLORIDE: 102 mmol/L (ref 101–111)
CO2: 27 mmol/L (ref 22–32)
Calcium: 8.9 mg/dL (ref 8.9–10.3)
Creatinine, Ser: 1.05 mg/dL — ABNORMAL HIGH (ref 0.44–1.00)
GFR calc Af Amer: 53 mL/min — ABNORMAL LOW (ref >60)
GFR calc non Af Amer: 45 mL/min — ABNORMAL LOW (ref >60)
GLUCOSE: 118 mg/dL — AB (ref 65–99)
POTASSIUM: 3.6 mmol/L (ref 3.5–5.1)
Sodium: 142 mmol/L (ref 135–145)

## 2017-09-14 LAB — ECHOCARDIOGRAM COMPLETE
HEIGHTINCHES: 63 in
WEIGHTICAEL: 2667.2 [oz_av]

## 2017-09-14 LAB — URINE CULTURE: Culture: <10000 — AB

## 2017-09-14 LAB — TSH: TSH: 0.921 u[IU]/mL (ref 0.350–4.500)

## 2017-09-14 MED ORDER — APIXABAN 5 MG PO TABS
5.0000 mg | ORAL_TABLET | Freq: Two times a day (BID) | ORAL | Status: DC
  Administered 2017-09-14: 5 mg via ORAL
  Filled 2017-09-14: qty 1

## 2017-09-14 MED ORDER — FUROSEMIDE 10 MG/ML IJ SOLN
40.0000 mg | Freq: Two times a day (BID) | INTRAMUSCULAR | Status: DC
  Administered 2017-09-14 – 2017-09-15 (×2): 40 mg via INTRAVENOUS
  Filled 2017-09-14 (×2): qty 4

## 2017-09-14 MED ORDER — ACETAMINOPHEN 325 MG PO TABS
650.0000 mg | ORAL_TABLET | ORAL | Status: DC | PRN
  Administered 2017-09-15: 650 mg via ORAL
  Filled 2017-09-14: qty 2

## 2017-09-14 NOTE — Progress Notes (Signed)
Nutrition Education Note  RD consulted for nutrition education regarding CHF.  RD provided "Low Sodium Nutrition Therapy" handout from the Academy of [employer] and [employer]etics. Reviewed patient's dietary recall. Provided examples on ways to decrease sodium intake in diet. Discouraged intake of processed foods and use of salt shaker. Encouraged fresh fruits and vegetables as well as whole grain sources of carbohydrates to maximize fiber intake.   RD discussed why it is important for patient to adhere to diet recommendations, and emphasized the role of fluids, foods to avoid, and importance of weighing self daily. Teach back method used.  Expect good compliance.  Body mass index is 29.53 kg/m. Pt meets criteria for overweight based on current BMI.  Current diet order is 2 gm sodium, patient is consuming approximately 60-80% of meals at this time. Labs and medications reviewed. No further nutrition interventions warranted at this time. RD contact information provided. If additional nutrition issues arise, please re-consult RD.   Joaquin CourtsKimberly Harris, RD, LDN, CNSC Pager [phone] After Hours Pager [phone]

## 2017-09-14 NOTE — Discharge Instructions (Signed)

## 2017-09-14 NOTE — Progress Notes (Signed)
  Echocardiogram 2D Echocardiogram has been performed.  Victoria Gomez 09/14/2017, 11:30 AM

## 2017-09-14 NOTE — Plan of Care (Signed)
Patient instructed to use call light and phone as needed  when getting OOB but requires reminding her often, bed alarm on for safety, all personal items within reach.

## 2017-09-14 NOTE — Progress Notes (Signed)
[place] TEAM 1 - Stepdown/ICU TEAM  Victoria Gomez  ZOX:[id number] DOB: 11/23/1924 DOA: 09/13/2017 PCP: Shelva Majestic, MD    Brief Narrative:  83 y.o. female with a history of CAD status post percutaneous coronary intervention, severe HTN, HLD, hypothyroidism, combined systolic and diastolic congestive heart failure, permanent atrial fibrillation on Eliquis, and DM2 who presented from home with complaints of acute shortness of breath. Upon arrival of EMS patient was in extremis and immediately placed on BiPAP.  BP was 230 systolic during transport and her respiratory status declined. She was started on a nitroglycerin drip.   Significant Events: 11/4 admit via ED 11/5 TTE  Subjective: Pt is resting comfortably and states she feels much better.  She is anxious to get home asap.  She denies cp, n/v, or abdom pain.    Assessment & Plan:  Acute hypoxic respiratory failure Due to pulmonary edema - much improved - wean oxygen as able  Acute exacerbation chronic combined systolic and diastolic CHF Diuretics being titrated by cardiology -EF 40-45% per TTE this admission with grade 2 diastolic dysfunction Filed Weights   09/14/17 0410  Weight: 75.6 kg (166 lb 11.2 oz)    Hypertensive emergency Blood pressure improved - continue without change in tx plan today  CAD with mild troponin elevation Care as per cardiology  CKD stage III Creatinine stable despite ongoing diuresis at this time - follow  Recent Labs  Lab 09/13/17 0820 09/13/17 1112 09/14/17 0436  CREATININE 0.93 0.93 1.05*   Permanent atrial fibrillation Continue apixaban - rate controlled   DM 2 CBG reasonably controlled - is not on meds at home - A1c 6.3 presently - mutiple prior checks in the past similar, with 1 or 2 at 6.5, therefore meeting formal diagnosis  Hypothyroidism Continue home Synthroid dose  Hyperlipidemia Continue statin  DVT prophylaxis: Eliquis Code Status: FULL CODE Family  Communication: spoke w/ son at bedside  Disposition Plan: tele - PT/OT - ?d/c next 48-72hrs   Consultants:  Cardiology  Antimicrobials:  none  Objective: Blood pressure 101/66, pulse 76, temperature 98 F (36.7 C), temperature source Oral, resp. rate 20, height 5\' 3"  (1.6 m), weight 75.6 kg (166 lb 11.2 oz), SpO2 95 %.  Intake/Output Summary (Last 24 hours) at 09/14/2017 1604 Last data filed at 09/14/2017 1025 Gross per 24 hour  Intake 180 ml  Output 1650 ml  Net -1470 ml   Filed Weights   09/14/17 0410  Weight: 75.6 kg (166 lb 11.2 oz)    Examination: General: No acute respiratory distress Lungs: Clear to auscultation bilaterally without wheezes or crackles Cardiovascular: Regular rate and rhythm without murmur gallop or rub normal S1 and S2 Abdomen: Nontender, nondistended, soft, bowel sounds positive, no rebound, no ascites, no appreciable mass Extremities: 1+ B LE edema   CBC: Recent Labs  Lab 09/13/17 0820 09/13/17 1112  WBC 9.1 7.9  HGB 12.5 12.8  HCT 38.9 39.2  MCV 88.8 88.3  PLT 225 193   Basic Metabolic Panel: Recent Labs  Lab 09/13/17 0820 09/13/17 1112 09/14/17 0436  NA 138  --  142  K 4.4  --  3.6  CL 110  --  102  CO2 22  --  27  GLUCOSE 139*  --  118*  BUN 13  --  18  CREATININE 0.93 0.93 1.05*  CALCIUM 9.1  --  8.9  MG 1.9  --   --    GFR: Estimated Creatinine Clearance: 34.7 mL/min (A) (by C-G  formula based on SCr of 1.05 mg/dL (H)).  Liver Function Tests: Recent Labs  Lab 09/13/17 0820  AST 21  ALT 11*  ALKPHOS 80  BILITOT 0.4  PROT 7.0  ALBUMIN 3.6   Recent Labs  Lab 09/13/17 0957  LIPASE 26    Cardiac Enzymes: Recent Labs  Lab 09/14/17 1026  TROPONINI 0.18*    HbA1C: Hgb A1c MFr Bld  Date/Time Value Ref Range Status  09/13/2017 11:12 AM 6.3 (H) 4.8 - 5.6 % Final    Comment:    (NOTE) Pre diabetes:          5.7%-6.4% Diabetes:              >6.4% Glycemic control for   <7.0% adults with diabetes     06/25/2017 02:57 PM 6.4 4.6 - 6.5 % Final    Comment:    Glycemic Control Guidelines for People with Diabetes:Non Diabetic:  <6%Goal of Therapy: <7%Additional Action Suggested:  >8%     CBG: Recent Labs  Lab 09/13/17 1645 09/13/17 1831 09/13/17 2228 09/14/17 0732 09/14/17 1135  GLUCAP 144* 113* 130* 125* 144*    Recent Results (from the past 240 hour(s))  Urine culture     Status: Abnormal   Collection Time: 09/13/17 12:04 PM  Result Value Ref Range Status   Specimen Description URINE, RANDOM  Final   Special Requests NONE  Final   Culture <10,000 COLONIES/mL INSIGNIFICANT GROWTH (A)  Final   Report Status 09/14/2017 FINAL  Final  MRSA PCR Screening     Status: None   Collection Time: 09/13/17  7:30 PM  Result Value Ref Range Status   MRSA by PCR NEGATIVE NEGATIVE Final    Comment:        The GeneXpert MRSA Assay (FDA approved for NASAL specimens only), is one component of a comprehensive MRSA colonization surveillance program. It is not intended to diagnose MRSA infection nor to guide or monitor treatment for MRSA infections.      Scheduled Meds: . apixaban  5 mg Oral BID  . diclofenac sodium  2 g Topical QID  . furosemide  40 mg Intravenous Q12H  . insulin aspart  0-15 Units Subcutaneous TID WC  . isosorbide mononitrate  90 mg Oral Daily  . levothyroxine  50 mcg Oral QAC breakfast  . losartan  50 mg Oral Daily  . metoprolol tartrate  12.5 mg Oral BID  . pantoprazole  20 mg Oral Daily  . pentosan polysulfate  100 mg Oral BID  . potassium chloride  10 mEq Oral Daily  . pravastatin  40 mg Oral Daily  . sodium chloride flush  3 mL Intravenous Q12H     LOS: 1 day   Lonia BloodJeffrey T. Tameah Mihalko, MD [facility] Office  [phone] Pager - Text Page per Amion as per below:  On-Call/Text Page:      [email or web address]      password [email or web address]  If 7PM-7AM, please contact night-coverage [email or web address] Password [email or web address] 09/14/2017, 4:04 PM

## 2017-09-14 NOTE — Progress Notes (Signed)
Progress Note  Patient Name: Victoria Gomez Date of Encounter: 09/14/2017  Primary Cardiologist: Dr Antoine Poche  Subjective   Dyspnea improved but persists; no chest pain  Inpatient Medications    Scheduled Meds: . apixaban  2.5 mg Oral BID  . aspirin EC  81 mg Oral Daily  . diclofenac sodium  2 g Topical QID  . furosemide  80 mg Intravenous Q12H  . insulin aspart  0-15 Units Subcutaneous TID WC  . isosorbide mononitrate  90 mg Oral Daily  . levothyroxine  50 mcg Oral QAC breakfast  . losartan  50 mg Oral Daily  . metoprolol tartrate  12.5 mg Oral BID  . pantoprazole  20 mg Oral Daily  . pentosan polysulfate  100 mg Oral BID  . potassium chloride  10 mEq Oral Daily  . pravastatin  40 mg Oral Daily  . sodium chloride flush  3 mL Intravenous Q12H   Continuous Infusions: . sodium chloride    . nitroGLYCERIN Stopped (09/13/17 1630)   PRN Meds: sodium chloride, acetaminophen, ALPRAZolam, benzonatate, guaiFENesin-dextromethorphan, ondansetron (ZOFRAN) IV, sodium chloride flush   Vital Signs    Vitals:   09/14/17 0455 09/14/17 0555 09/14/17 0815 09/14/17 0854  BP: 114/68 118/74  121/76  Pulse: 84 88 82 99  Resp:      Temp:   (!) 97.4 F (36.3 C)   TempSrc:   Oral   SpO2: 94% 96% 95%   Weight:      Height:        Intake/Output Summary (Last 24 hours) at 09/14/2017 0925 Last data filed at 09/14/2017 0618 Gross per 24 hour  Intake 60 ml  Output 4400 ml  Net -4340 ml   Filed Weights   09/14/17 0410  Weight: 166 lb 11.2 oz (75.6 kg)    Telemetry    Atrial fibrillation, rate controlled- Personally Reviewed   Physical Exam   GEN: No acute distress.   Neck: No JVD Cardiac: irregular, no murmurs, rubs, or gallops.  Respiratory: Mild basilar crackles GI: Soft, nontender, non-distended  MS: No edema; No deformity. Neuro:  Nonfocal  Psych: Normal affect   Labs    Chemistry Recent Labs  Lab 09/13/17 0820 09/13/17 1112 09/14/17 0436  NA 138  --  142  K  4.4  --  3.6  CL 110  --  102  CO2 22  --  27  GLUCOSE 139*  --  118*  BUN 13  --  18  CREATININE 0.93 0.93 1.05*  CALCIUM 9.1  --  8.9  PROT 7.0  --   --   ALBUMIN 3.6  --   --   AST 21  --   --   ALT 11*  --   --   ALKPHOS 80  --   --   BILITOT 0.4  --   --   GFRNONAA 53* 53* 45*  GFRAA >60 >60 53*  ANIONGAP 6  --  13     Hematology Recent Labs  Lab 09/13/17 0820 09/13/17 1112  WBC 9.1 7.9  RBC 4.38 4.44  HGB 12.5 12.8  HCT 38.9 39.2  MCV 88.8 88.3  MCH 28.5 28.8  MCHC 32.1 32.7  RDW 13.7 13.2  PLT 225 193     Recent Labs  Lab 09/13/17 0900 09/13/17 1131  TROPIPOC 0.01 0.14*     BNP Recent Labs  Lab 09/13/17 0820  BNP 611.2*      Radiology    Dg Chest Portable  1 View  Result Date: 09/13/2017 CLINICAL DATA:  83 year old female with shortness of breath and abnormal pulmonary auscultation. Recent cough. EXAM: PORTABLE CHEST 1 VIEW COMPARISON:  05/10/2014 and earlier. FINDINGS: Portable AP semi upright view at 0812 hours. Diffuse increased pulmonary interstitial opacity, somewhat confluent at the lung bases. Borderline to mild cardiomegaly is chronic. Other mediastinal contours are within normal limits. Visualized tracheal air column is within normal limits. No pneumothorax. No definite pleural effusion. IMPRESSION: Diffuse bilateral increased pulmonary interstitial opacity. Favor acute viral/atypical pneumonia in this clinical setting with main differential consideration of acute interstitial edema. Electronically Signed   By: Odessa FlemingH  Hall M.D.   On: 09/13/2017 09:07    Patient Profile     83 year old female with acute on chronic combined systolic/diastolic congestive heart failure. Patient with history of left bundle branch block, hypertension, hyperlipidemia, combined systolic/diastolic congestive heart failure, coronary disease and permanent atrial fibrillation. She has a history of remote PCI of RCA but at last catheterization 2012 was noted to be occluded.  Residual 40% LAD. Also with history of permanent atrial fibrillation. Her blood pressure medications were decreased recently because of dizziness. Admitted with CHF and hypertension.    Assessment & Plan    1 acute on chronic combined systolic/diastolic congestive heart failure-I/O - 4340. Would decrease Lasix to 40 mg IV twice a day. Follow renal function closely. Await echocardiogram.   2 hypertension-blood pressure improved; continue present meds and follow.  3 permanent atrial fibrillation-continue low-dose metoprolol for rate control. CHADSvasc 6. Continue apixaban (increase dose to 5 mg BID given age, wt and renal function).  4 coronary artery disease-continue statin. DC aspirin given need for anticoagulation.  5 minimally elevated troponin-likely secondary to CHF. Will repeat; no ischemia eval if no clear trend.  6 hyperlipidemia-continue statin.      For questions or updates, please contact [facility] Please consult [email or web address] for contact info under Cardiology/STEMI.      Signed, Olga MillersBrian Keven Osborn, MD  09/14/2017, 9:25 AM

## 2017-09-15 ENCOUNTER — Other Ambulatory Visit (HOSPITAL_COMMUNITY): Payer: Medicare Other

## 2017-09-15 LAB — GLUCOSE, CAPILLARY
GLUCOSE-CAPILLARY: 111 mg/dL — AB (ref 65–99)
Glucose-Capillary: 122 mg/dL — ABNORMAL HIGH (ref 65–99)
Glucose-Capillary: 129 mg/dL — ABNORMAL HIGH (ref 65–99)
Glucose-Capillary: 124 mg/dL — ABNORMAL HIGH (ref 65–99)

## 2017-09-15 LAB — HEPARIN LEVEL (UNFRACTIONATED): HEPARIN UNFRACTIONATED: 1.64 [IU]/mL — AB (ref 0.30–0.70)

## 2017-09-15 LAB — BASIC METABOLIC PANEL
Anion gap: 8 (ref 5–15)
BUN: 27 mg/dL — ABNORMAL HIGH (ref 6–20)
CHLORIDE: 103 mmol/L (ref 101–111)
CO2: 29 mmol/L (ref 22–32)
Calcium: 8.5 mg/dL — ABNORMAL LOW (ref 8.9–10.3)
Creatinine, Ser: 1.44 mg/dL — ABNORMAL HIGH (ref 0.44–1.00)
GFR calc non Af Amer: 31 mL/min — ABNORMAL LOW (ref >60)
GFR, EST AFRICAN AMERICAN: 36 mL/min — AB (ref >60)
Glucose, Bld: 121 mg/dL — ABNORMAL HIGH (ref 65–99)
POTASSIUM: 3.5 mmol/L (ref 3.5–5.1)
SODIUM: 140 mmol/L (ref 135–145)

## 2017-09-15 LAB — TROPONIN I
TROPONIN I: 0.07 ng/mL — AB (ref <0.03)
TROPONIN I: 0.05 ng/mL — AB (ref <0.03)
Troponin I: 0.06 ng/mL (ref <0.03)

## 2017-09-15 LAB — APTT: APTT: 68 s — AB (ref 24–36)

## 2017-09-15 MED ORDER — HEPARIN (PORCINE) IN NACL 100-0.45 UNIT/ML-% IJ SOLN
800.0000 [IU]/h | INTRAMUSCULAR | Status: DC
  Administered 2017-09-15: 850 [IU]/h via INTRAVENOUS
  Administered 2017-09-16: 800 [IU]/h via INTRAVENOUS
  Filled 2017-09-15 (×2): qty 250

## 2017-09-15 MED ORDER — ASPIRIN 81 MG PO CHEW
81.0000 mg | CHEWABLE_TABLET | Freq: Every day | ORAL | Status: DC
  Administered 2017-09-15 – 2017-09-17 (×2): 81 mg via ORAL
  Filled 2017-09-15 (×2): qty 1

## 2017-09-15 MED ORDER — NITROGLYCERIN 0.4 MG SL SUBL
SUBLINGUAL_TABLET | SUBLINGUAL | Status: AC
  Administered 2017-09-15
  Filled 2017-09-15: qty 1

## 2017-09-15 MED ORDER — MORPHINE SULFATE (PF) 2 MG/ML IV SOLN: 1.0000 mg | INTRAVENOUS | Status: DC

## 2017-09-15 MED ORDER — NITROGLYCERIN 0.4 MG SL SUBL
0.4000 mg | SUBLINGUAL_TABLET | SUBLINGUAL | Status: DC | PRN
  Administered 2017-09-15 – 2017-09-16 (×5): 0.4 mg via SUBLINGUAL
  Filled 2017-09-15: qty 1

## 2017-09-15 MED ORDER — QUETIAPINE FUMARATE 25 MG PO TABS
12.5000 mg | ORAL_TABLET | Freq: Every day | ORAL | Status: DC
  Administered 2017-09-15 – 2017-09-16 (×2): 12.5 mg via ORAL
  Filled 2017-09-15 (×2): qty 1

## 2017-09-15 NOTE — Progress Notes (Signed)
During my assessment noticed pt was holding her left breast, asked what was wrong and she stated that she was having pain under her left breast, no SOB or diaphoresis noted and pt's VSS, got an EKG and gave NTG S/L, EKG not much different than earlier today but since her Troponins were 0.14 and 0.18 placed a page to [facility] on call and will page Cards also, let charge RN know what was going on, will continue to monitor.

## 2017-09-15 NOTE — Progress Notes (Signed)
ANTICOAGULATION CONSULT NOTE - Follow-Up Consult  Pharmacy Consult for heparin Indication: chest pain/ACS  Allergies  Allergen Reactions  . Codeine Hives and Nausea And Vomiting  . Sulfonamide Derivatives Swelling    Turns red    Patient Measurements: Height: 5\' 3"  (160 cm) Weight: 160 lb 4.8 oz (72.7 kg) IBW/kg (Calculated) : 52.4 Heparin Dosing Weight: 68.5kg  Vital Signs: Temp: 98.3 F (36.8 C) (11/06 2006) Temp Source: Oral (11/06 2006) BP: 121/60 (11/06 2006) Pulse Rate: 83 (11/06 2006)  Labs: Recent Labs    09/13/17 0820 09/13/17 1112 09/14/17 0436  09/15/17 0412 09/15/17 0828 09/15/17 1259 09/15/17 1840  HGB 12.5 12.8  --   --   --   --   --   --   HCT 38.9 39.2  --   --   --   --   --   --   PLT 225 193  --   --   --   --   --   --   APTT  --   --   --   --   --   --   --  68*  HEPARINUNFRC  --   --   --   --   --   --   --  1.64*  CREATININE 0.93 0.93 1.05*  --  1.44*  --   --   --   TROPONINI  --   --   --    < >  --  0.07* 0.06* 0.05*   < > = values in this interval not displayed.    Estimated Creatinine Clearance: 24.8 mL/min (A) (by C-G formula based on SCr of 1.44 mg/dL (H)).  Medications:  Infusions:  . heparin 850 Units/hr (09/15/17 1856)    Assessment: 89 yof on chronic apixaban for history of afib now to transition to IV heparin for r/o ACS. Last dose of apixaban was last night. Baseline CBC was WNL. Renal function has been trending upwards (SCr 1.44, CrCl 25 mL/min).  Heparin level came back at 1.64 (still impacted by last apixaban dose) and aPTT came back at 68, within goal range, on 850 units/hr. No s/sx of bleeding noted. No issues with infusion per nursing.   Goal of Therapy:  Heparin level 0.3-0.7 units/ml aPTT 66-102 seconds Monitor platelets by anticoagulation protocol: Yes   Plan:  Continue heparin gtt at 850 units/hr  Check an 8 hr heparin level and aPTT Daily heparin level, aPTT and CBC Monitor for signs/symptoms of  bleeding   Girard CooterKimberly Perkins, PharmD Clinical Pharmacist  Pager: [phone] Phone: [phone] 09/15/2017,8:07 PM

## 2017-09-15 NOTE — Progress Notes (Signed)
Progress Note  Patient Name: Victoria Gomez Date of Encounter: 09/15/2017  Primary Cardiologist: Dr Antoine PocheHochrein  Subjective   Pt confuse this AM; denies CP or dyspnea; does not realize [facility].  Inpatient Medications    Scheduled Meds: . apixaban  5 mg Oral BID  . diclofenac sodium  2 g Topical QID  . furosemide  40 mg Intravenous Q12H  . insulin aspart  0-15 Units Subcutaneous TID WC  . isosorbide mononitrate  90 mg Oral Daily  . levothyroxine  50 mcg Oral QAC breakfast  . losartan  50 mg Oral Daily  . metoprolol tartrate  12.5 mg Oral BID  .  morphine injection  1 mg Intravenous NOW  . pantoprazole  20 mg Oral Daily  . pentosan polysulfate  100 mg Oral BID  . potassium chloride  10 mEq Oral Daily  . pravastatin  40 mg Oral Daily   Continuous Infusions:  PRN Meds: acetaminophen, ALPRAZolam, benzonatate, guaiFENesin-dextromethorphan, nitroGLYCERIN, ondansetron (ZOFRAN) IV   Vital Signs    Vitals:   09/14/17 2001 09/15/17 0010 09/15/17 0516 09/15/17 0755  BP: 126/67 122/80 110/64 122/72  Pulse: 96 81 88 70  Resp: 18 20 16    Temp: 98.4 F (36.9 C) 98.6 F (37 C) 97.7 F (36.5 C) 98.2 F (36.8 C)  TempSrc: Oral Oral Oral Oral  SpO2: 93% 95% 98% 92%  Weight:   160 lb 4.8 oz (72.7 kg)   Height:        Intake/Output Summary (Last 24 hours) at 09/15/2017 0812 Last data filed at 09/15/2017 0700 Gross per 24 hour  Intake 420 ml  Output 875 ml  Net -455 ml   Filed Weights   09/14/17 0410 09/15/17 0516  Weight: 166 lb 11.2 oz (75.6 kg) 160 lb 4.8 oz (72.7 kg)    Telemetry    Atrial fibrillation, rate controlled- Personally Reviewed   Physical Exam   GEN: WD/WN No acute distress.   Neck: No JVD, supple Cardiac: irregular Respiratory: CTA GI: Soft, nontender, non-distended, no masses  MS: No edema Neuro:  Confused; grossly intact   Labs    Chemistry Recent Labs  Lab 09/13/17 0820 09/13/17 1112 09/14/17 0436 09/15/17 0412  NA 138  --  142  140  K 4.4  --  3.6 3.5  CL 110  --  102 103  CO2 22  --  27 29  GLUCOSE 139*  --  118* 121*  BUN 13  --  18 27*  CREATININE 0.93 0.93 1.05* 1.44*  CALCIUM 9.1  --  8.9 8.5*  PROT 7.0  --   --   --   ALBUMIN 3.6  --   --   --   AST 21  --   --   --   ALT 11*  --   --   --   ALKPHOS 80  --   --   --   BILITOT 0.4  --   --   --   GFRNONAA 53* 53* 45* 31*  GFRAA >60 >60 53* 36*  ANIONGAP 6  --  13 8     Hematology Recent Labs  Lab 09/13/17 0820 09/13/17 1112  WBC 9.1 7.9  RBC 4.38 4.44  HGB 12.5 12.8  HCT 38.9 39.2  MCV 88.8 88.3  MCH 28.5 28.8  MCHC 32.1 32.7  RDW 13.7 13.2  PLT 225 193     Recent Labs  Lab 09/13/17 0900 09/13/17 1131  TROPIPOC 0.01  0.14*     BNP Recent Labs  Lab 09/13/17 0820  BNP 611.2*      Radiology    Dg Chest Portable 1 View  Result Date: 09/13/2017 CLINICAL DATA:  83 year old female with shortness of breath and abnormal pulmonary auscultation. Recent cough. EXAM: PORTABLE CHEST 1 VIEW COMPARISON:  05/10/2014 and earlier. FINDINGS: Portable AP semi upright view at 0812 hours. Diffuse increased pulmonary interstitial opacity, somewhat confluent at the lung bases. Borderline to mild cardiomegaly is chronic. Other mediastinal contours are within normal limits. Visualized tracheal air column is within normal limits. No pneumothorax. No definite pleural effusion. IMPRESSION: Diffuse bilateral increased pulmonary interstitial opacity. Favor acute viral/atypical pneumonia in this clinical setting with main differential consideration of acute interstitial edema. Electronically Signed   By: Odessa FlemingH  Hall M.D.   On: 09/13/2017 09:07    Patient Profile     83 year old female with acute on chronic combined systolic/diastolic congestive heart failure. Patient with history of left bundle branch block, hypertension, hyperlipidemia, combined systolic/diastolic congestive heart failure, coronary disease and permanent atrial fibrillation. She has a history of  remote PCI of RCA but at last catheterization 2012 was noted to be occluded. Residual 40% LAD. Also with history of permanent atrial fibrillation. Her blood pressure medications were decreased recently because of dizziness. Admitted with CHF and hypertension.    Assessment & Plan    1 acute on chronic combined systolic/diastolic congestive heart failure-echo shows ejection fraction 40-45% similar to previous. Renal function worse. Hold Lasix today.  2 chest pain-patient had chest pain last evening. She states it was in the substernal area but is otherwise nondescript. Her electrocardiogram shows new anterior lateral T-wave inversion. Will repeat troponin.hold apixaban. Treat with aspirin and heparin. Continue metoprolol and statin. She will likely need cardiac catheterization. However she is mildly confused this morning possibly secondary to sundowning. Her renal function is also worse. Will reassess over next 24 hours.  3 hypertension-blood pressure controlled. Continue present medications.  4 permanent atrial fibrillation-continue low-dose metoprolol for rate control. CHADSvasc 6. Hold apixaban and treat with heparin for now given probable ACS.  5 coronary artery disease-continue statin. Resume ASA 81 mg daily.  6 hyperlipidemia-continue statin.   7 confusion-new; however no focal neuro findings; possible sundowning; follow; if no improvement may need head CT.     For questions or updates, please contact [facility] Please consult [email or web address] for contact info under Cardiology/STEMI.      Signed, Olga MillersBrian Maliea Grandmaison, MD  09/15/2017, 8:12 AM

## 2017-09-15 NOTE — Consult Note (Signed)
[facility] CM Primary Care Navigator  09/15/2017  Victoria Gomez [birth year] [id number]       Met withpatientat the bedside to identify possible discharge needs.  Patientreports having "difficulty breathing" that had led to this admission.  Patient endorses Dr. Garret Reddish [facility] [place] [facility] [place] her[facility] care provider.   Patient shared using [facility] on [address] and [place] obtain medications without difficulty.   Patient states managing hermedications at home straight out of the containers and uses her own system (in drawers).   She reports that son Delfino Lovett) providestransportation to her doctors'appointments.  Patient's son lives with her. Patient states that they serve as primary caregivers for each other at home.  Anticipated discharge planis home per patient. Awaiting PT/ OT evaluation and recommendation.  Patientvoiced understanding to call her primary care provider's officewhen she gets home,for a post discharge follow-upwithin a week or sooner if needed.Patient letter (with PCP's contact number) was provided as her reminder.  Patient was admitted with acute on chronic combined systolic/ diastolic congestive heart failure during this admission.  She presented from home with complaints of acute shortness of breath resulting to this hospitalization.  Explained to patient about [facility] CM services available for health management at home (mainly HF; DM- diet controlled- not on any medications).She is pretty well educated and knowledgeable of ways in managing her chronic illnesses. Patient voiced preference and had verbally agreed for [facility] HF calls over home visits to follow-up with herrecovery at home.  Referral was made for EMMI HFcalls after discharge.  Patient expressed understanding to seekreferral to [facility] care managementfrom primary care provider if deemed necessary for further  servicesin the future.   [facility] care management information provided for future needs that may arise.   For additional questions please contact:  Edwena Felty A. Roxene Alviar, BSN, RN-BC [facility] PRIMARY CARE Navigator Cell: [phone]

## 2017-09-15 NOTE — Progress Notes (Signed)
ANTICOAGULATION CONSULT NOTE - Initial Consult  Pharmacy Consult for heparin Indication: chest pain/ACS  Allergies  Allergen Reactions  . Codeine Hives and Nausea And Vomiting  . Sulfonamide Derivatives Swelling    Turns red    Patient Measurements: Height: 5\' 3"  (160 cm) Weight: 160 lb 4.8 oz (72.7 kg) IBW/kg (Calculated) : 52.4 Heparin Dosing Weight: 68.5kg  Vital Signs: Temp: 98.2 F (36.8 C) (11/06 0755) Temp Source: Oral (11/06 0755) BP: 122/72 (11/06 0755) Pulse Rate: 70 (11/06 0755)  Labs: Recent Labs    09/13/17 0820 09/13/17 1112 09/14/17 0436 09/14/17 1026 09/15/17 0412  HGB 12.5 12.8  --   --   --   HCT 38.9 39.2  --   --   --   PLT 225 193  --   --   --   CREATININE 0.93 0.93 1.05*  --  1.44*  TROPONINI  --   --   --  0.18*  --     Estimated Creatinine Clearance: 24.8 mL/min (A) (by C-G formula based on SCr of 1.44 mg/dL (H)).  Medications:  Infusions:  . heparin      Assessment: 89 yof on chronic apixaban for history of afib now to transition to IV heparin for r/o ACS. Last dose of apixaban was last night. Baseline CBC was WNL. No bleeding noted.   Goal of Therapy:  Heparin level 0.3-0.7 units/ml aPTT 66-102 seconds Monitor platelets by anticoagulation protocol: Yes   Plan:  Start heparin gtt at 850 units/hr at 10am today Check an 8 hr heparin level and aPTT Daily heparin level, aPTT and CB  Janeah Kovacich, Drake Leachachel Lynn 09/15/2017,8:41 AM

## 2017-09-15 NOTE — Progress Notes (Signed)
Spoke with [facility] MD and Cards on call MD and informed that pt is refusing to take her morphine but pain is starting to subside, also let them know that I put some Voltaren under her breast and that seemed to help, will give Tylenol, at pt's request, and continue to monitor, VSS.

## 2017-09-15 NOTE — Progress Notes (Addendum)
[place] TEAM 1 - Stepdown/ICU TEAM  Victoria Gomez  ZOX:[id number] DOB: Jun 29, 1925 DOA: 09/13/2017 PCP: Shelva MajesticHunter, Stephen O, MD    Brief Narrative:  [age] y.o. female with a history of CAD status post percutaneous coronary intervention, severe HTN, HLD, hypothyroidism, combined systolic and diastolic congestive heart failure, permanent atrial fibrillation on Eliquis, and DM2 who presented from home with complaints of acute shortness of breath. Upon arrival of EMS patient was in extremis and immediately placed on BiPAP.  BP was 230 systolic during transport and her respiratory status declined. She was started on a nitroglycerin drip.   Significant Events: 11/4 admit via ED 11/5 TTE  Subjective: Pt developed chest pain last evening.  She reports this has resolved at this time, but she is mildly confused.  She denies current sob, n/v, or abdom pain.    Assessment & Plan:  Acute hypoxic respiratory failure Due to pulmonary edema - much improved - wean oxygen to RA  Acute exacerbation chronic combined systolic and diastolic CHF Diuretics being titrated by Cardiology, but on hold for now due to worsening renal fxn - EF 40-45% per TTE this admission with grade 2 diastolic dysfunction - appears euvolemic in exam   Filed Weights   09/14/17 0410 09/15/17 0516  Weight: 75.6 kg (166 lb 11.2 oz) 72.7 kg (160 lb 4.8 oz)    Hypertensive emergency Blood pressure now at goal/well controlled   CAD with mild troponin elevation - chest pain  Care as per Cardiology - now anterior lateral T wave inversions noted during chest pain on night of 11/5 - for cardiac cath when renal fxn proves to be stable   Mild confusion  Most c/w sundowning - avoid benzos - trial of QHS very low dose seroqeul   CKD stage III Creatinine beginning to climb - stopped diuretic today - follow - stop ARB for now   Recent Labs  Lab 09/13/17 0820 09/13/17 1112 09/14/17 0436 09/15/17 0412  CREATININE 0.93 0.93 1.05* 1.44*    Permanent atrial fibrillation Continue apixaban - rate controlled   DM 2 CBG controlled - is not on meds at home - A1c 6.3 presently - mutiple prior checks in the past similar, with 1 or 2 at 6.5, therefore meeting formal diagnosis  Hypothyroidism Continue home Synthroid dose  Hyperlipidemia Continue statin  DVT prophylaxis: Eliquis Code Status: FULL CODE Family Communication: no family present at time of exam today  Disposition Plan: awaiting possible cardiac cath   Consultants:  Cardiology  Antimicrobials:  none  Objective: Blood pressure 111/67, pulse 77, temperature 98 F (36.7 C), temperature source Oral, resp. rate 18, height 5\' 3"  (1.6 m), weight 72.7 kg (160 lb 4.8 oz), SpO2 97 %.  Intake/Output Summary (Last 24 hours) at 09/15/2017 1602 Last data filed at 09/15/2017 1537 Gross per 24 hour  Intake 224.63 ml  Output 1275 ml  Net -1050.37 ml   Filed Weights   09/14/17 0410 09/15/17 0516  Weight: 75.6 kg (166 lb 11.2 oz) 72.7 kg (160 lb 4.8 oz)    Examination: General: No acute respiratory distress Lungs: Clear to auscultation bilaterally - no crackles  Cardiovascular: Regular rate without murmur gallop or rub normal S1 and S2 Abdomen: Nontender, nondistended, soft, bowel sounds positive, no rebound, no ascites, no appreciable mass Extremities: no signif LE edema   CBC: Recent Labs  Lab 09/13/17 0820 09/13/17 1112  WBC 9.1 7.9  HGB 12.5 12.8  HCT 38.9 39.2  MCV 88.8 88.3  PLT 225 193  Basic Metabolic Panel: Recent Labs  Lab 09/13/17 0820 09/13/17 1112 09/14/17 0436 09/15/17 0412  NA 138  --  142 140  K 4.4  --  3.6 3.5  CL 110  --  102 103  CO2 22  --  27 29  GLUCOSE 139*  --  118* 121*  BUN 13  --  18 27*  CREATININE 0.93 0.93 1.05* 1.44*  CALCIUM 9.1  --  8.9 8.5*  MG 1.9  --   --   --    GFR: Estimated Creatinine Clearance: 24.8 mL/min (A) (by C-G formula based on SCr of 1.44 mg/dL (H)).  Liver Function Tests: Recent Labs  Lab  09/13/17 0820  AST 21  ALT 11*  ALKPHOS 80  BILITOT 0.4  PROT 7.0  ALBUMIN 3.6   Recent Labs  Lab 09/13/17 0957  LIPASE 26    Cardiac Enzymes: Recent Labs  Lab 09/14/17 1026 09/15/17 0828 09/15/17 1259  TROPONINI 0.18* 0.07* 0.06*    HbA1C: Hgb A1c MFr Bld  Date/Time Value Ref Range Status  09/13/2017 11:12 AM 6.3 (H) 4.8 - 5.6 % Final    Comment:    (NOTE) Pre diabetes:          5.7%-6.4% Diabetes:              >6.4% Glycemic control for   <7.0% adults with diabetes   06/25/2017 02:57 PM 6.4 4.6 - 6.5 % Final    Comment:    Glycemic Control Guidelines for People with Diabetes:Non Diabetic:  <6%Goal of Therapy: <7%Additional Action Suggested:  >8%     CBG: Recent Labs  Lab 09/14/17 1135 09/14/17 1633 09/14/17 2111 09/15/17 0748 09/15/17 1134  GLUCAP 144* 119* 138* 111* 122*    Recent Results (from the past 240 hour(s))  Urine culture     Status: Abnormal   Collection Time: 09/13/17 12:04 PM  Result Value Ref Range Status   Specimen Description URINE, RANDOM  Final   Special Requests NONE  Final   Culture <10,000 COLONIES/mL INSIGNIFICANT GROWTH (A)  Final   Report Status 09/14/2017 FINAL  Final  MRSA PCR Screening     Status: None   Collection Time: 09/13/17  7:30 PM  Result Value Ref Range Status   MRSA by PCR NEGATIVE NEGATIVE Final    Comment:        The GeneXpert MRSA Assay (FDA approved for NASAL specimens only), is one component of a comprehensive MRSA colonization surveillance program. It is not intended to diagnose MRSA infection nor to guide or monitor treatment for MRSA infections.      Scheduled Meds: . aspirin  81 mg Oral Daily  . diclofenac sodium  2 g Topical QID  . insulin aspart  0-15 Units Subcutaneous TID WC  . isosorbide mononitrate  90 mg Oral Daily  . levothyroxine  50 mcg Oral QAC breakfast  . losartan  50 mg Oral Daily  . metoprolol tartrate  12.5 mg Oral BID  .  morphine injection  1 mg Intravenous NOW  .  pantoprazole  20 mg Oral Daily  . pentosan polysulfate  100 mg Oral BID  . potassium chloride  10 mEq Oral Daily  . pravastatin  40 mg Oral Daily     LOS: 2 days   Lonia Blood, MD [facility] Office  [phone] Pager - Text Page per Amion as per below:  On-Call/Text Page:      [email or web address]      password [facility]  If 7PM-7AM, please contact night-coverage [email or web address] Password [email or web address] 09/15/2017, 4:02 PM

## 2017-09-16 ENCOUNTER — Encounter (HOSPITAL_COMMUNITY)
Admission: EM | Disposition: A | Discharge status: Home / Self Care | Payer: Self-pay | Source: Home / Self Care | Attending: Internal Medicine

## 2017-09-16 ENCOUNTER — Other Ambulatory Visit: Payer: Self-pay

## 2017-09-16 DIAGNOSIS — E1121 Type 2 diabetes mellitus with diabetic nephropathy: Secondary | ICD-10-CM

## 2017-09-16 DIAGNOSIS — I482 Chronic atrial fibrillation: Secondary | ICD-10-CM

## 2017-09-16 DIAGNOSIS — N183 Chronic kidney disease, stage 3 (moderate): Secondary | ICD-10-CM

## 2017-09-16 HISTORY — PX: LEFT HEART CATH AND CORONARY ANGIOGRAPHY: CATH118249

## 2017-09-16 LAB — LIPID PANEL
CHOLESTEROL: 124 mg/dL (ref 0–200)
HDL: 39 mg/dL — AB (ref >40)
LDL Cholesterol: 64 mg/dL (ref 0–99)
TRIGLYCERIDES: 103 mg/dL (ref <150)
Total CHOL/HDL Ratio: 3.2 RATIO
VLDL: 21 mg/dL (ref 0–40)

## 2017-09-16 LAB — BASIC METABOLIC PANEL
ANION GAP: 9 (ref 5–15)
BUN: 26 mg/dL — AB (ref 6–20)
CALCIUM: 8.6 mg/dL — AB (ref 8.9–10.3)
CO2: 30 mmol/L (ref 22–32)
CREATININE: 1.19 mg/dL — AB (ref 0.44–1.00)
Chloride: 102 mmol/L (ref 101–111)
GFR calc Af Amer: 45 mL/min — ABNORMAL LOW (ref >60)
GFR, EST NON AFRICAN AMERICAN: 39 mL/min — AB (ref >60)
GLUCOSE: 110 mg/dL — AB (ref 65–99)
Potassium: 3.4 mmol/L — ABNORMAL LOW (ref 3.5–5.1)
Sodium: 141 mmol/L (ref 135–145)

## 2017-09-16 LAB — CBC
HCT: 39.1 % (ref 36.0–46.0)
Hemoglobin: 12.5 g/dL (ref 12.0–15.0)
MCH: 28.5 pg (ref 26.0–34.0)
MCHC: 32 g/dL (ref 30.0–36.0)
MCV: 89.3 fL (ref 78.0–100.0)
PLATELETS: 220 10*3/uL (ref 150–400)
RBC: 4.38 MIL/uL (ref 3.87–5.11)
RDW: 13.2 % (ref 11.5–15.5)
WBC: 8.5 10*3/uL (ref 4.0–10.5)

## 2017-09-16 LAB — HEPARIN LEVEL (UNFRACTIONATED): HEPARIN UNFRACTIONATED: 1.92 [IU]/mL — AB (ref 0.30–0.70)

## 2017-09-16 LAB — PROTIME-INR
INR: 1.2
Prothrombin Time: 15.1 seconds (ref 11.4–15.2)

## 2017-09-16 LAB — GLUCOSE, CAPILLARY
GLUCOSE-CAPILLARY: 89 mg/dL (ref 65–99)
Glucose-Capillary: 105 mg/dL — ABNORMAL HIGH (ref 65–99)
Glucose-Capillary: 87 mg/dL (ref 65–99)
Glucose-Capillary: 79 mg/dL (ref 65–99)

## 2017-09-16 LAB — POCT ACTIVATED CLOTTING TIME: ACTIVATED CLOTTING TIME: 147 s

## 2017-09-16 LAB — APTT
APTT: 111 s — AB (ref 24–36)
APTT: 87 s — AB (ref 24–36)

## 2017-09-16 SURGERY — LEFT HEART CATH AND CORONARY ANGIOGRAPHY
Anesthesia: LOCAL

## 2017-09-16 MED ORDER — POTASSIUM CHLORIDE CRYS ER 20 MEQ PO TBCR
40.0000 meq | EXTENDED_RELEASE_TABLET | Freq: Once | ORAL | Status: AC
  Administered 2017-09-16: 40 meq via ORAL
  Filled 2017-09-16: qty 2

## 2017-09-16 MED ORDER — SODIUM CHLORIDE 0.9% FLUSH: 3.0000 mL | INTRAVENOUS | Status: DC | PRN

## 2017-09-16 MED ORDER — NITROGLYCERIN 1 MG/10 ML FOR IR/CATH LAB
INTRA_ARTERIAL | Status: AC
  Filled 2017-09-16: qty 10

## 2017-09-16 MED ORDER — HEPARIN (PORCINE) IN NACL 2-0.9 UNIT/ML-% IJ SOLN
INTRAMUSCULAR | Status: AC
  Filled 2017-09-16: qty 1000

## 2017-09-16 MED ORDER — LIDOCAINE HCL (PF) 1 % IJ SOLN
INTRAMUSCULAR | Status: DC | PRN
  Administered 2017-09-16: 15 mL
  Administered 2017-09-16: 2 mL

## 2017-09-16 MED ORDER — IOPAMIDOL (ISOVUE-370) INJECTION 76%
INTRAVENOUS | Status: DC | PRN
  Administered 2017-09-16: 30 mL via INTRA_ARTERIAL

## 2017-09-16 MED ORDER — SODIUM CHLORIDE 0.9% FLUSH
3.0000 mL | Freq: Two times a day (BID) | INTRAVENOUS | Status: DC
  Administered 2017-09-17: 3 mL via INTRAVENOUS

## 2017-09-16 MED ORDER — SODIUM CHLORIDE 0.9% FLUSH
3.0000 mL | Freq: Two times a day (BID) | INTRAVENOUS | Status: DC
  Administered 2017-09-16: 3 mL via INTRAVENOUS

## 2017-09-16 MED ORDER — VERAPAMIL HCL 2.5 MG/ML IV SOLN
INTRAVENOUS | Status: AC
  Filled 2017-09-16: qty 2

## 2017-09-16 MED ORDER — MIDAZOLAM HCL 2 MG/2ML IJ SOLN
INTRAMUSCULAR | Status: DC | PRN
  Administered 2017-09-16 (×2): 0.5 mg via INTRAVENOUS

## 2017-09-16 MED ORDER — SODIUM CHLORIDE 0.9 % WEIGHT BASED INFUSION: 1.0000 mL/kg/h | INTRAVENOUS | Status: DC

## 2017-09-16 MED ORDER — HEPARIN (PORCINE) IN NACL 2-0.9 UNIT/ML-% IJ SOLN
INTRAMUSCULAR | Status: AC | PRN
  Administered 2017-09-16: 1000 mL

## 2017-09-16 MED ORDER — LIDOCAINE HCL (PF) 1 % IJ SOLN
INTRAMUSCULAR | Status: AC
  Filled 2017-09-16: qty 30

## 2017-09-16 MED ORDER — SODIUM CHLORIDE 0.9 % WEIGHT BASED INFUSION: 1.0000 mL/kg/h | INTRAVENOUS | Status: AC

## 2017-09-16 MED ORDER — SODIUM CHLORIDE 0.9 % IV SOLN: 250.0000 mL | INTRAVENOUS | Status: DC | PRN

## 2017-09-16 MED ORDER — ASPIRIN 81 MG PO CHEW
81.0000 mg | CHEWABLE_TABLET | ORAL | Status: AC
  Administered 2017-09-16: 81 mg via ORAL
  Filled 2017-09-16: qty 1

## 2017-09-16 MED ORDER — HEPARIN SODIUM (PORCINE) 1000 UNIT/ML IJ SOLN
INTRAMUSCULAR | Status: AC
  Filled 2017-09-16: qty 1

## 2017-09-16 MED ORDER — GLUCOSE BLOOD VI STRP: ORAL_STRIP | 3 refills | Status: DC

## 2017-09-16 MED ORDER — SODIUM CHLORIDE 0.9 % WEIGHT BASED INFUSION
3.0000 mL/kg/h | INTRAVENOUS | Status: DC
  Administered 2017-09-16: 3 mL/kg/h via INTRAVENOUS

## 2017-09-16 MED ORDER — MIDAZOLAM HCL 2 MG/2ML IJ SOLN
INTRAMUSCULAR | Status: AC
  Filled 2017-09-16: qty 2

## 2017-09-16 SURGICAL SUPPLY — 11 items
CATH INFINITI 5FR MULTPACK ANG (CATHETERS) ×2 IMPLANT
COVER PRB 48X5XTLSCP FOLD TPE (BAG) ×1 IMPLANT
COVER PROBE 5X48 (BAG) ×1
DEVICE RAD COMP TR BAND LRG (VASCULAR PRODUCTS) ×2 IMPLANT
GLIDESHEATH SLEND SS 6F .021 (SHEATH) ×2 IMPLANT
KIT HEART LEFT (KITS) ×2 IMPLANT
PACK CARDIAC CATHETERIZATION (CUSTOM PROCEDURE TRAY) ×2 IMPLANT
SHEATH PINNACLE 5F 10CM (SHEATH) ×2 IMPLANT
TRANSDUCER W/STOPCOCK (MISCELLANEOUS) ×2 IMPLANT
TUBING CIL FLEX 10 FLL-RA (TUBING) ×2 IMPLANT
WIRE EMERALD 3MM-J .035X150CM (WIRE) ×2 IMPLANT

## 2017-09-16 NOTE — Progress Notes (Signed)
PROGRESS NOTE    Victoria Gomez  ZOX:[id number] DOB: 1925-06-16 DOA: 09/13/2017 PCP: Shelva Majestic, MD   Brief Narrative:  83 y.o.WF PMHx CAD  S/P percutaneous coronary intervention, severe HTN, chronic systolic and diastolic CHF, permanent atrial fibrillation on Eliquis, HLD, hypothyroidism, Diabetes Type 2   Presented from home with complaints of acute shortness of breath. Upon arrival of EMS patient was in extremis and immediately placed on BiPAP.  BP was 230 systolic during transport and her respiratory status declined. She was started on a nitroglycerin drip.     Subjective: 11/77 A/O 4, negative CP, negative SOB. Negative abdominal pain, negative N/V.   Assessment & Plan:   Principal Problem:   Acute respiratory failure with hypoxemia (HCC) Active Problems:   Hypothyroidism   Hyperlipidemia   Permanent atrial fibrillation (HCC)   CKD (chronic kidney disease), stage III (HCC)   Diabetes mellitus II   Acute on chronic combined systolic and diastolic CHF (congestive heart failure) (HCC)   Hypertensive heart and kidney disease with acute on chronic combined systolic and diastolic congestive heart failure and stage 3 chronic kidney disease (HCC)  Acute respiratory failure with hypoxia -Resolved  Acute on chronic systolic and diastolic CHF  -Diuretics being titrated by cardiology -EF 40-45% per TTE this admission with grade 2 diastolic dysfunction -Cardiac catheterization pending  -Strict I&O -Daily weight    Hypertensive emergency -Resolved   CAD with mild troponin elevation -Care as per cardiology  Permanent atrial fibrillation -Currently sinus tachycardia -Continue heparin drip until post cardiac catheterization transition back to Eliquis per cardiology   CKD stage III  Recent Labs  Lab 09/13/17 0820 09/13/17 1112 09/14/17 0436 09/15/17 0412 09/16/17 0302  CREATININE 0.93 0.93 1.05* 1.44* 1.19*     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  VENTILATOR SETTINGS:    Cultures   Antimicrobials: Anti-infectives (From admission, onward)   None       Devices    LINES / TUBES:      Continuous Infusions: . [START ON 09/17/2017] sodium chloride    . sodium chloride       Objective: Vitals:   09/16/17 1740 09/16/17 1745 09/16/17 1750 09/16/17 1755  BP: (!) 147/76 (!) 160/75  126/83  Pulse: 77 (!) 31 86 93  Resp: (!) 21 19 (!) 23 (!) 21  Temp:      TempSrc:      SpO2: 95% 100% 94% 94%  Weight:      Height:        Intake/Output Summary (Last 24 hours) at 09/16/2017 2014 Last data filed at 09/16/2017 1600 Gross per 24 hour  Intake 157.57 ml  Output 450 ml  Net -292.43 ml   Filed Weights   09/14/17 0410 09/15/17 0516 09/16/17 0345  Weight: 166 lb 11.2 oz (75.6 kg) 160 lb 4.8 oz (72.7 kg) 160 lb 14.4 oz (73 kg)    Examination:  General: A/O 4, No acute respiratory distress Neck:  Negative scars, masses, torticollis, lymphadenopathy, JVD Lungs: Clear to auscultation bilaterally without wheezes  or crackles Cardiovascular: Tachycardia, Regular rhythm without murmur gallop or rub normal S1 and S2 Abdomen: negative abdominal pain, nondistended, positive soft, bowel sounds, no rebound, no ascites, no appreciable mass Extremities: No significant cyanosis, clubbing, or edema bilateral lower extremities Skin: Negative rashes, lesions, ulcers Psychiatric:  Negative depression, negative anxiety, negative fatigue, negative mania   Central nervous system:  Cranial nerves II through XII intact, tongue/uvula midline, all extremities muscle strength 5/5, sensation intact throughout, negative dysarthria, negative expressive aphasia, negative receptive aphasia.  .     Data Reviewed: Care during the described time interval was provided by me .  I have reviewed this patient's available data, including medical history, events of note, physical examination, and all test results as part of my evaluation.   CBC: Recent Labs  Lab 09/13/17 0820 09/13/17 1112 09/16/17 0302  WBC 9.1 7.9 8.5  HGB 12.5 12.8 12.5  HCT 38.9 39.2 39.1  MCV 88.8 88.3 89.3  PLT 225 193 220   Basic Metabolic Panel: Recent Labs  Lab 09/13/17 0820 09/13/17 1112 09/14/17 0436 09/15/17 0412 09/16/17 0302  NA 138  --  142 140 141  K 4.4  --  3.6 3.5 3.4*  CL 110  --  102 103 102  CO2 22  --  27 29 30   GLUCOSE 139*  --  118* 121* 110*  BUN 13  --  18 27* 26*  CREATININE 0.93 0.93 1.05* 1.44* 1.19*  CALCIUM 9.1  --  8.9 8.5* 8.6*  MG 1.9  --   --   --   --    GFR: Estimated Creatinine Clearance: 30.1 mL/min (A) (by C-G formula based on SCr of 1.19 mg/dL (H)). Liver Function Tests: Recent Labs  Lab 09/13/17 0820  AST 21  ALT 11*  ALKPHOS 80  BILITOT 0.4  PROT 7.0  ALBUMIN 3.6   Recent Labs  Lab 09/13/17 0957  LIPASE 26   No results for input(s): AMMONIA in the last 168 hours. Coagulation Profile: Recent Labs  Lab 09/16/17 0909  INR 1.20   Cardiac Enzymes: Recent Labs  Lab 09/14/17 1026 09/15/17 0828 09/15/17 1259 09/15/17 1840  TROPONINI 0.18* 0.07* 0.06* 0.05*   BNP (last 3 results) No results for input(s): PROBNP in the last 8760 hours. HbA1C: No results for input(s): HGBA1C in the last 72 hours. CBG: Recent Labs  Lab 09/15/17 1731 09/15/17 2129 09/16/17 0753 09/16/17 1201 09/16/17 1553  GLUCAP 129* 124* 105* 87 89   Lipid Profile: No results for input(s): CHOL, HDL, LDLCALC, TRIG, CHOLHDL, LDLDIRECT  in the last 72 hours. Thyroid Function Tests: Recent Labs    09/14/17 0436  TSH 0.921   Anemia Panel: No results for input(s): VITAMINB12, FOLATE, FERRITIN, TIBC, IRON, RETICCTPCT in the last 72 hours. Urine analysis:    Component Value Date/Time   COLORURINE COLORLESS (A) 09/13/2017 1204   APPEARANCEUR CLEAR 09/13/2017 1204   LABSPEC 1.004 (L) 09/13/2017 1204   PHURINE 6.0 09/13/2017 1204   GLUCOSEU NEGATIVE 09/13/2017 1204   HGBUR SMALL (A) 09/13/2017 1204   BILIRUBINUR NEGATIVE 09/13/2017 1204   KETONESUR NEGATIVE 09/13/2017 1204   PROTEINUR NEGATIVE 09/13/2017 1204   UROBILINOGEN 0.2 05/10/2014 0705   NITRITE NEGATIVE 09/13/2017 1204   LEUKOCYTESUR NEGATIVE 09/13/2017 1204   Sepsis Labs: @LABRCNTIP (procalcitonin:4,lacticidven:4)  ) Recent Results (from the past 240 hour(s))  Urine culture     Status: Abnormal   Collection Time: 09/13/17 12:04 PM  Result Value Ref Range Status   Specimen Description URINE, RANDOM  Final   Special Requests NONE  Final   Culture <10,000 COLONIES/mL INSIGNIFICANT GROWTH (A)  Final   Report Status 09/14/2017 FINAL  Final  MRSA PCR Screening     Status: None   Collection Time: 09/13/17  7:30 PM  Result Value Ref Range Status   MRSA by PCR NEGATIVE NEGATIVE Final    Comment:        The GeneXpert MRSA Assay (FDA approved for NASAL specimens only), is one component of a comprehensive MRSA colonization surveillance program. It is not intended to diagnose MRSA infection nor to guide or monitor treatment for MRSA infections.          Radiology Studies: No results found.      Scheduled Meds: . aspirin  81 mg Oral Daily  . diclofenac sodium  2 g Topical QID  . insulin aspart  0-15 Units Subcutaneous TID WC  . isosorbide mononitrate  90 mg Oral Daily  . levothyroxine  50 mcg Oral QAC breakfast  . metoprolol tartrate  12.5 mg Oral BID  . pantoprazole  20 mg Oral Daily  . pentosan polysulfate  100 mg Oral BID  . potassium  chloride  10 mEq Oral Daily  . pravastatin  40 mg Oral Daily  . QUEtiapine  12.5 mg Oral QHS  . [START ON 09/17/2017] sodium chloride flush  3 mL Intravenous Q12H   Continuous Infusions: . [START ON 09/17/2017] sodium chloride    . sodium chloride       LOS: 3 days    Time spent: 40 minutes    Gudrun Axe, Roselind MessierURTIS J, MD [facility] Pager [phone]   If 7PM-7AM, please contact night-coverage [email or web address] Password [email or web address] 09/16/2017, 8:14 PM

## 2017-09-16 NOTE — H&P (View-Only) (Signed)
Progress Note  Patient Name: Victoria Gomez Date of Encounter: 09/16/2017  Primary Cardiologist: Dr Antoine PocheHochrein  Subjective   Pt denies CP at present but apparently with CP earlier per nursing notes; no dyspnea  Inpatient Medications    Scheduled Meds: . aspirin  81 mg Oral Daily  . diclofenac sodium  2 g Topical QID  . insulin aspart  0-15 Units Subcutaneous TID WC  . isosorbide mononitrate  90 mg Oral Daily  . levothyroxine  50 mcg Oral QAC breakfast  . metoprolol tartrate  12.5 mg Oral BID  . pantoprazole  20 mg Oral Daily  . pentosan polysulfate  100 mg Oral BID  . potassium chloride  10 mEq Oral Daily  . pravastatin  40 mg Oral Daily  . QUEtiapine  12.5 mg Oral QHS   Continuous Infusions: . heparin 800 Units/hr (09/16/17 0700)   PRN Meds: acetaminophen, benzonatate, guaiFENesin-dextromethorphan, nitroGLYCERIN, ondansetron (ZOFRAN) IV   Vital Signs    Vitals:   09/15/17 2006 09/15/17 2229 09/16/17 0002 09/16/17 0345  BP: 121/60  127/82 128/81  Pulse: 83 90 80 92  Resp: 18  20 18   Temp: 98.3 F (36.8 C)  99.1 F (37.3 C) 98.4 F (36.9 C)  TempSrc: Oral  Oral Oral  SpO2: 96%  98% 93%  Weight:    160 lb 14.4 oz (73 kg)  Height:        Intake/Output Summary (Last 24 hours) at 09/16/2017 0733 Last data filed at 09/16/2017 0600 Gross per 24 hour  Intake 398.39 ml  Output 850 ml  Net -451.61 ml   Filed Weights   09/14/17 0410 09/15/17 0516 09/16/17 0345  Weight: 166 lb 11.2 oz (75.6 kg) 160 lb 4.8 oz (72.7 kg) 160 lb 14.4 oz (73 kg)    Telemetry    Atrial fibrillation, rate controlled- Personally Reviewed   Physical Exam   GEN: WD/WN NAD Neck: Supple Cardiac: irregular, no gallop Respiratory: CTA, no wheeze GI: Soft, NT/ND MS: No edema this AM Neuro:  Confusion improved; grossly intact   Labs    Chemistry Recent Labs  Lab 09/13/17 0820  09/14/17 0436 09/15/17 0412 09/16/17 0302  NA 138  --  142 140 141  K 4.4  --  3.6 3.5 3.4*  CL 110   --  102 103 102  CO2 22  --  27 29 30   GLUCOSE 139*  --  118* 121* 110*  BUN 13  --  18 27* 26*  CREATININE 0.93   < > 1.05* 1.44* 1.19*  CALCIUM 9.1  --  8.9 8.5* 8.6*  PROT 7.0  --   --   --   --   ALBUMIN 3.6  --   --   --   --   AST 21  --   --   --   --   ALT 11*  --   --   --   --   ALKPHOS 80  --   --   --   --   BILITOT 0.4  --   --   --   --   GFRNONAA 53*   < > 45* 31* 39*  GFRAA >60   < > 53* 36* 45*  ANIONGAP 6  --  13 8 9    < > = values in this interval not displayed.     Hematology Recent Labs  Lab 09/13/17 0820 09/13/17 1112 09/16/17 0302  WBC 9.1 7.9 8.5  RBC 4.38 4.44  4.38  HGB 12.5 12.8 12.5  HCT 38.9 39.2 39.1  MCV 88.8 88.3 89.3  MCH 28.5 28.8 28.5  MCHC 32.1 32.7 32.0  RDW 13.7 13.2 13.2  PLT 225 193 220     Recent Labs  Lab 09/13/17 0900 09/13/17 1131  TROPIPOC 0.01 0.14*     BNP Recent Labs  Lab 09/13/17 0820  BNP 611.2*      Patient Profile     83 year old female with acute on chronic combined systolic/diastolic congestive heart failure. Patient with history of left bundle branch block, hypertension, hyperlipidemia, combined systolic/diastolic congestive heart failure, coronary disease and permanent atrial fibrillation. She has a history of remote PCI of RCA but at last catheterization 2012 was noted to be occluded. Residual 40% LAD. Also with history of permanent atrial fibrillation. Her blood pressure medications were decreased recently because of dizziness. Admitted with CHF and hypertension. Developed CP with new anterolateral TWI; mildly elevated troponin    Assessment & Plan    1 acute on chronic combined systolic/diastolic congestive heart failure-echo shows ejection fraction 40-45% similar to previous. Symptoms improved; lasix on hold for cath; resume following procedure.  2 chest pain/UA-patient with recurrent CP. Her electrocardiogram shows new anterior lateral T-wave inversion. Continue aspirin and heparin. Continue  metoprolol and statin. Plan to proceed with cath today; risks and benefits discussed including MI, death and CVA and she agrees to proceed. Attempted to contact Josefina DoKen Carder pt's son at [phone] but no answer.  3 hypertension-blood pressure controlled. Continue present medications.  4 permanent atrial fibrillation-continue low-dose metoprolol for rate control. CHADSvasc 6. Apixaban on hold for cath (last dose 11/5 PM); will resume following cath.  5 coronary artery disease-continue ASA and statin.  6 hyperlipidemia-continue statin.   7 confusion-improved; likely related to sundowning; pt very functional at home.    For questions or updates, please contact [facility] Please consult [email or web address] for contact info under Cardiology/STEMI.      Signed, Olga MillersBrian Crenshaw, MD  09/16/2017, 7:33 AM

## 2017-09-16 NOTE — Progress Notes (Signed)
Site area: rt groin fa sheath Site Prior to Removal:  Level 0 Pressure Applied For: 20 minutes Manual:   yes Patient Status During Pull:  stable Post Pull Site:  Level  0 Post Pull Instructions Given:  yes Post Pull Pulses Present: palpable Dressing Applied:  Gauze and yegaderm Bedrest begins @ 1755 Comments:

## 2017-09-16 NOTE — Progress Notes (Signed)
Pt reports return on CP 10/10. L Chest/back. Nitro given and EKG performed earlier. VSS.  Notified MD. Awaiting call back. Will continue to monitor.

## 2017-09-16 NOTE — Progress Notes (Signed)
Pt reporting Chest discomfort 8/10. EKG performed. Nitro given x2 w/ relief. Pt reports taking nitro when at home x2 for relief. Will continue to monitor.

## 2017-09-16 NOTE — Interval H&P Note (Signed)
History and Physical Interval Note:  09/16/2017 4:13 PM  Victoria HaltMary E Gomez  has presented today for surgery, with the diagnosis of cp  The various methods of treatment have been discussed with the patient and family. After consideration of risks, benefits and other options for treatment, the patient has consented to  Procedure(s): LEFT HEART CATH AND CORONARY ANGIOGRAPHY (N/A) as a surgical intervention .  The patient's history has been reviewed, patient examined, no change in status, stable for surgery.  I have reviewed the patient's chart and labs.  Questions were answered to the patient's satisfaction.    Cath Lab Visit (complete for each Cath Lab visit)  Clinical Evaluation Leading to the Procedure:   ACS: Yes.    Non-ACS:    Anginal Classification: CCS III  Anti-ischemic medical therapy: Maximal Therapy (2 or more classes of medications)  Non-Invasive Test Results: No non-invasive testing performed  Prior CABG: No previous CABG       Peter SwazilandJordan MD,FACC 09/16/2017 4:13 PM

## 2017-09-16 NOTE — Progress Notes (Signed)
ANTICOAGULATION CONSULT NOTE - Follow Up Consult  Pharmacy Consult for heparin Indication: chest pain/ACS and atrial fibrillation  Labs: Recent Labs    09/14/17 0436  09/15/17 0412 09/15/17 0828 09/15/17 1259 09/15/17 1840 09/16/17 0302 09/16/17 0557 09/16/17 0909 09/16/17 1441  HGB  --   --   --   --   --   --  12.5  --   --   --   HCT  --   --   --   --   --   --  39.1  --   --   --   PLT  --   --   --   --   --   --  220  --   --   --   APTT  --   --   --   --   --  68*  --  111*  --  87*  LABPROT  --   --   --   --   --   --   --   --  15.1  --   INR  --   --   --   --   --   --   --   --  1.20  --   HEPARINUNFRC  --   --   --   --   --  1.64* 1.92*  --   --   --   CREATININE 1.05*  --  1.44*  --   --   --  1.19*  --   --   --   TROPONINI  --    < >  --  0.07* 0.06* 0.05*  --   --   --   --    < > = values in this interval not displayed.    Assessment: 83yo female on hep for acs  apixaban on hold for cath so following aptt  Within goal at 87  Goal of Therapy:  aPTT 66-102 seconds   Plan:  Continue heparin 800 units/hr Next lvl with am labs F/u conversion back to apixaban  Isaac BlissMichael Jin Shockley, PharmD, BCPS, BCCCP Clinical Pharmacist 09/16/2017 4:11 PM

## 2017-09-16 NOTE — Progress Notes (Signed)
ANTICOAGULATION CONSULT NOTE - Follow Up Consult  Pharmacy Consult for heparin Indication: chest pain/ACS and atrial fibrillation  Labs: Recent Labs    09/13/17 0820 09/13/17 1112 09/14/17 0436  09/15/17 0412 09/15/17 0828 09/15/17 1259 09/15/17 1840 09/16/17 0302 09/16/17 0557  HGB 12.5 12.8  --   --   --   --   --   --  12.5  --   HCT 38.9 39.2  --   --   --   --   --   --  39.1  --   PLT 225 193  --   --   --   --   --   --  220  --   APTT  --   --   --   --   --   --   --  68*  --  111*  HEPARINUNFRC  --   --   --   --   --   --   --  1.64* 1.92*  --   CREATININE 0.93 0.93 1.05*  --  1.44*  --   --   --  1.19*  --   TROPONINI  --   --   --    < >  --  0.07* 0.06* 0.05*  --   --    < > = values in this interval not displayed.    Assessment: 83yo female now above goal on heparin after one PTT at goal, no signs of bleeding per RN.  Goal of Therapy:  aPTT 66-102 seconds   Plan:  Will decrease heparin gtt slightly to 800 units/hr and check PTT in 8hr.  Vernard GamblesVeronda Caydance Kuehnle, PharmD, BCPS  09/16/2017,7:03 AM

## 2017-09-16 NOTE — Progress Notes (Signed)
 Progress Note  Patient Name: Victoria Gomez Date of Encounter: 09/16/2017  Primary Cardiologist: Dr Hochrein  Subjective   Pt denies CP at present but apparently with CP earlier per nursing notes; no dyspnea  Inpatient Medications    Scheduled Meds: . aspirin  81 mg Oral Daily  . diclofenac sodium  2 g Topical QID  . insulin aspart  0-15 Units Subcutaneous TID WC  . isosorbide mononitrate  90 mg Oral Daily  . levothyroxine  50 mcg Oral QAC breakfast  . metoprolol tartrate  12.5 mg Oral BID  . pantoprazole  20 mg Oral Daily  . pentosan polysulfate  100 mg Oral BID  . potassium chloride  10 mEq Oral Daily  . pravastatin  40 mg Oral Daily  . QUEtiapine  12.5 mg Oral QHS   Continuous Infusions: . heparin 800 Units/hr (09/16/17 0700)   PRN Meds: acetaminophen, benzonatate, guaiFENesin-dextromethorphan, nitroGLYCERIN, ondansetron (ZOFRAN) IV   Vital Signs    Vitals:   09/15/17 2006 09/15/17 2229 09/16/17 0002 09/16/17 0345  BP: 121/60  127/82 128/81  Pulse: 83 90 80 92  Resp: 18  20 18  Temp: 98.3 F (36.8 C)  99.1 F (37.3 C) 98.4 F (36.9 C)  TempSrc: Oral  Oral Oral  SpO2: 96%  98% 93%  Weight:    160 lb 14.4 oz (73 kg)  Height:        Intake/Output Summary (Last 24 hours) at 09/16/2017 0733 Last data filed at 09/16/2017 0600 Gross per 24 hour  Intake 398.39 ml  Output 850 ml  Net -451.61 ml   Filed Weights   09/14/17 0410 09/15/17 0516 09/16/17 0345  Weight: 166 lb 11.2 oz (75.6 kg) 160 lb 4.8 oz (72.7 kg) 160 lb 14.4 oz (73 kg)    Telemetry    Atrial fibrillation, rate controlled- Personally Reviewed   Physical Exam   GEN: WD/WN NAD Neck: Supple Cardiac: irregular, no gallop Respiratory: CTA, no wheeze GI: Soft, NT/ND MS: No edema this AM Neuro:  Confusion improved; grossly intact   Labs    Chemistry Recent Labs  Lab 09/13/17 0820  09/14/17 0436 09/15/17 0412 09/16/17 0302  NA 138  --  142 140 141  K 4.4  --  3.6 3.5 3.4*  CL 110   --  102 103 102  CO2 22  --  27 29 30  GLUCOSE 139*  --  118* 121* 110*  BUN 13  --  18 27* 26*  CREATININE 0.93   < > 1.05* 1.44* 1.19*  CALCIUM 9.1  --  8.9 8.5* 8.6*  PROT 7.0  --   --   --   --   ALBUMIN 3.6  --   --   --   --   AST 21  --   --   --   --   ALT 11*  --   --   --   --   ALKPHOS 80  --   --   --   --   BILITOT 0.4  --   --   --   --   GFRNONAA 53*   < > 45* 31* 39*  GFRAA >60   < > 53* 36* 45*  ANIONGAP 6  --  13 8 9   < > = values in this interval not displayed.     Hematology Recent Labs  Lab 09/13/17 0820 09/13/17 1112 09/16/17 0302  WBC 9.1 7.9 8.5  RBC 4.38 4.44   4.38  HGB 12.5 12.8 12.5  HCT 38.9 39.2 39.1  MCV 88.8 88.3 89.3  MCH 28.5 28.8 28.5  MCHC 32.1 32.7 32.0  RDW 13.7 13.2 13.2  PLT 225 193 220     Recent Labs  Lab 09/13/17 0900 09/13/17 1131  TROPIPOC 0.01 0.14*     BNP Recent Labs  Lab 09/13/17 0820  BNP 611.2*      Patient Profile     83 year old female with acute on chronic combined systolic/diastolic congestive heart failure. Patient with history of left bundle branch block, hypertension, hyperlipidemia, combined systolic/diastolic congestive heart failure, coronary disease and permanent atrial fibrillation. She has a history of remote PCI of RCA but at last catheterization 2012 was noted to be occluded. Residual 40% LAD. Also with history of permanent atrial fibrillation. Her blood pressure medications were decreased recently because of dizziness. Admitted with CHF and hypertension. Developed CP with new anterolateral TWI; mildly elevated troponin    Assessment & Plan    1 acute on chronic combined systolic/diastolic congestive heart failure-echo shows ejection fraction 40-45% similar to previous. Symptoms improved; lasix on hold for cath; resume following procedure.  2 chest pain/UA-patient with recurrent CP. Her electrocardiogram shows new anterior lateral T-wave inversion. Continue aspirin and heparin. Continue  metoprolol and statin. Plan to proceed with cath today; risks and benefits discussed including MI, death and CVA and she agrees to proceed. Attempted to contact Josefina DoKen Carder pt's son at [phone] but no answer.  3 hypertension-blood pressure controlled. Continue present medications.  4 permanent atrial fibrillation-continue low-dose metoprolol for rate control. CHADSvasc 6. Apixaban on hold for cath (last dose 11/5 PM); will resume following cath.  5 coronary artery disease-continue ASA and statin.  6 hyperlipidemia-continue statin.   7 confusion-improved; likely related to sundowning; pt very functional at home.    For questions or updates, please contact [facility] Please consult [email or web address] for contact info under Cardiology/STEMI.      Signed, Olga MillersBrian Crenshaw, MD  09/16/2017, 7:33 AM

## 2017-09-16 NOTE — Plan of Care (Addendum)
Pt reporting intermitted Left Chest discomfort . Nitro administered and EKG performed. Heat pack applied. Pt reports some relief. Will continue to monitor.

## 2017-09-16 NOTE — Care Management Note (Signed)
Case Management Note  Patient Details  Name: Victoria Gomez MRN: [id number] Date of Birth: Feb 15, 1925  Subjective/Objective: Pt presented for Acute Respiratory Failure-SOB Initiated on IV Lasix and CP on IV Heparin Gtt. Plan for Cardiac Cath today. PTA Independent from home with son. Pt has PCP and no problems getting her medications. No DME needs at this time.                     Action/Plan: Plan will be for home once stable. No further needs from CM.   Expected Discharge Date:                  Expected Discharge Plan:  Home/Self Care  In-House Referral:  NA  Discharge planning Services  CM Consult  Post Acute Care Choice:  NA Choice offered to:  NA  DME Arranged:  N/A DME Agency:  NA  HH Arranged:  NA HH Agency:  NA  Status of Service:  Completed, signed off  If discussed at Long Length of Stay Meetings, dates discussed:    Additional Comments:  Gala LewandowskyGraves-Bigelow, Marcquis Ridlon Kaye, RN 09/16/2017, 12:30 PM

## 2017-09-17 ENCOUNTER — Other Ambulatory Visit: Payer: Self-pay

## 2017-09-17 ENCOUNTER — Encounter (HOSPITAL_COMMUNITY): Payer: Self-pay | Admitting: Cardiology

## 2017-09-17 LAB — CBC
HEMATOCRIT: 38.9 % (ref 36.0–46.0)
HEMOGLOBIN: 12.5 g/dL (ref 12.0–15.0)
MCH: 28.6 pg (ref 26.0–34.0)
MCHC: 32.1 g/dL (ref 30.0–36.0)
MCV: 89 fL (ref 78.0–100.0)
Platelets: 201 10*3/uL (ref 150–400)
RBC: 4.37 MIL/uL (ref 3.87–5.11)
RDW: 13.2 % (ref 11.5–15.5)
WBC: 6.8 10*3/uL (ref 4.0–10.5)

## 2017-09-17 LAB — BASIC METABOLIC PANEL
Anion gap: 9 (ref 5–15)
BUN: 17 mg/dL (ref 6–20)
CHLORIDE: 109 mmol/L (ref 101–111)
CO2: 22 mmol/L (ref 22–32)
CREATININE: 1.03 mg/dL — AB (ref 0.44–1.00)
Calcium: 8.8 mg/dL — ABNORMAL LOW (ref 8.9–10.3)
GFR calc Af Amer: 54 mL/min — ABNORMAL LOW (ref >60)
GFR calc non Af Amer: 46 mL/min — ABNORMAL LOW (ref >60)
Glucose, Bld: 107 mg/dL — ABNORMAL HIGH (ref 65–99)
Potassium: 4.5 mmol/L (ref 3.5–5.1)
Sodium: 140 mmol/L (ref 135–145)

## 2017-09-17 LAB — GLUCOSE, CAPILLARY
Glucose-Capillary: 109 mg/dL — ABNORMAL HIGH (ref 65–99)
Glucose-Capillary: 103 mg/dL — ABNORMAL HIGH (ref 65–99)
Glucose-Capillary: 122 mg/dL — ABNORMAL HIGH (ref 65–99)

## 2017-09-17 LAB — MAGNESIUM: Magnesium: 1.8 mg/dL (ref 1.7–2.4)

## 2017-09-17 LAB — APTT: aPTT: 35 seconds (ref 24–36)

## 2017-09-17 MED ORDER — FUROSEMIDE 20 MG PO TABS: 60.0000 mg | ORAL_TABLET | Freq: Every day | ORAL | 0 refills | Status: DC

## 2017-09-17 MED ORDER — METOPROLOL TARTRATE 25 MG PO TABS: 25.0000 mg | ORAL_TABLET | Freq: Two times a day (BID) | ORAL | 0 refills | Status: DC

## 2017-09-17 MED ORDER — ISOSORBIDE MONONITRATE ER 30 MG PO TB24: 90.0000 mg | ORAL_TABLET | Freq: Every day | ORAL | 0 refills | Status: DC

## 2017-09-17 MED ORDER — APIXABAN 2.5 MG PO TABS
2.5000 mg | ORAL_TABLET | Freq: Two times a day (BID) | ORAL | Status: DC
  Administered 2017-09-17: 2.5 mg via ORAL
  Filled 2017-09-17: qty 1

## 2017-09-17 MED ORDER — POTASSIUM CHLORIDE CRYS ER 10 MEQ PO TBCR: 10.0000 meq | EXTENDED_RELEASE_TABLET | Freq: Every day | ORAL | 0 refills | Status: DC

## 2017-09-17 MED ORDER — QUETIAPINE FUMARATE 25 MG PO TABS: 12.5000 mg | ORAL_TABLET | Freq: Every day | ORAL | 0 refills | Status: DC

## 2017-09-17 MED ORDER — METOPROLOL TARTRATE 25 MG PO TABS: 25.0000 mg | ORAL_TABLET | Freq: Two times a day (BID) | ORAL | Status: DC

## 2017-09-17 MED ORDER — FUROSEMIDE 40 MG PO TABS: 60.0000 mg | ORAL_TABLET | Freq: Every day | ORAL | Status: DC

## 2017-09-17 NOTE — Progress Notes (Signed)
Progress Note  Patient Name: Victoria Gomez Date of Encounter: 09/17/2017  Primary Cardiologist: Dr Antoine PocheHochrein  Subjective   No CP or dyspnea  Inpatient Medications    Scheduled Meds: . aspirin  81 mg Oral Daily  . diclofenac sodium  2 g Topical QID  . insulin aspart  0-15 Units Subcutaneous TID WC  . isosorbide mononitrate  90 mg Oral Daily  . levothyroxine  50 mcg Oral QAC breakfast  . metoprolol tartrate  12.5 mg Oral BID  . pantoprazole  20 mg Oral Daily  . pentosan polysulfate  100 mg Oral BID  . potassium chloride  10 mEq Oral Daily  . pravastatin  40 mg Oral Daily  . QUEtiapine  12.5 mg Oral QHS  . sodium chloride flush  3 mL Intravenous Q12H   Continuous Infusions: . sodium chloride     PRN Meds: sodium chloride, acetaminophen, benzonatate, guaiFENesin-dextromethorphan, nitroGLYCERIN, ondansetron (ZOFRAN) IV, sodium chloride flush   Vital Signs    Vitals:   09/17/17 0021 09/17/17 0634 09/17/17 0754 09/17/17 0855  BP: (!) 146/72 139/76 (!) 155/77 (!) 153/81  Pulse: 94 78 (!) 112 100  Resp: 16 20 18    Temp:  97.9 F (36.6 C) 98 F (36.7 C)   TempSrc:  Oral Oral   SpO2: 96% 95% 97%   Weight:      Height:        Intake/Output Summary (Last 24 hours) at 09/17/2017 0904 Last data filed at 09/17/2017 0750 Gross per 24 hour  Intake 312 ml  Output 550 ml  Net -238 ml   Filed Weights   09/14/17 0410 09/15/17 0516 09/16/17 0345  Weight: 166 lb 11.2 oz (75.6 kg) 160 lb 4.8 oz (72.7 kg) 160 lb 14.4 oz (73 kg)    Telemetry    Atrial fibrillation, upper normal- Personally Reviewed   Physical Exam   GEN: WD/mildly obese NAD Neck: Supple, no JVD Cardiac: irregular, no murmur Respiratory: CTA, no rhonchi GI: Soft, NT/ND, no masses MS: Right groin with no hematoma and no bruit Neuro:  No focal findings   Labs    Chemistry Recent Labs  Lab 09/13/17 0820  09/15/17 0412 09/16/17 0302 09/17/17 0258  NA 138   < > 140 141 140  K 4.4   < > 3.5 3.4*  4.5  CL 110   < > 103 102 109  CO2 22   < > 29 30 22   GLUCOSE 139*   < > 121* 110* 107*  BUN 13   < > 27* 26* 17  CREATININE 0.93   < > 1.44* 1.19* 1.03*  CALCIUM 9.1   < > 8.5* 8.6* 8.8*  PROT 7.0  --   --   --   --   ALBUMIN 3.6  --   --   --   --   AST 21  --   --   --   --   ALT 11*  --   --   --   --   ALKPHOS 80  --   --   --   --   BILITOT 0.4  --   --   --   --   GFRNONAA 53*   < > 31* 39* 46*  GFRAA >60   < > 36* 45* 54*  ANIONGAP 6   < > 8 9 9    < > = values in this interval not displayed.     Hematology Recent Labs  Lab  09/13/17 1112 09/16/17 0302 09/17/17 0258  WBC 7.9 8.5 6.8  RBC 4.44 4.38 4.37  HGB 12.8 12.5 12.5  HCT 39.2 39.1 38.9  MCV 88.3 89.3 89.0  MCH 28.8 28.5 28.6  MCHC 32.7 32.0 32.1  RDW 13.2 13.2 13.2  PLT 193 220 201     Recent Labs  Lab 09/13/17 0900 09/13/17 1131  TROPIPOC 0.01 0.14*     BNP Recent Labs  Lab 09/13/17 0820  BNP 611.2*      Patient Profile     83 year old female with acute on chronic combined systolic/diastolic congestive heart failure. Patient with history of left bundle branch block, hypertension, hyperlipidemia, combined systolic/diastolic congestive heart failure, coronary disease and permanent atrial fibrillation. She has a history of remote PCI of RCA but at last catheterization 2012 was noted to be occluded. Residual 40% LAD. Also with history of permanent atrial fibrillation. Her blood pressure medications were decreased recently because of dizziness. Admitted with CHF and hypertension. Developed CP with new anterolateral TWI; mildly elevated troponin    Assessment & Plan    1 acute on chronic combined systolic/diastolic congestive heart failure-echo shows ejection fraction 40-45% similar to previous. No dyspnea; would resume lasix 60 mg daily at DC; take additional 40 mg daily for weight gain of 2-3 lbs.  2 chest pain/UA-No further symptoms; cath as outlined; plan medical therapy.  3  hypertension-blood pressure mildly elevated; increase metoprolol to 25 mg BID.  4 permanent atrial fibrillation-continue metoprolol for rate control. CHADSvasc 6. Resume apixaban 2.5 mg BID per Dr Antoine PocheHochrein; would readdress dose at FU as she should be on 5 mg BID based on dosing guide.  5 coronary artery disease-continue statin. DC asa given need for anticoagulation.  6 hyperlipidemia-continue statin.   7 confusion-resolved.  Pt can be DCed from a cardiac standpoint and fu with Dr Antoine PocheHochrein. Please call with questions.    For questions or updates, please contact [facility] Please consult [email or web address] for contact info under Cardiology/STEMI.      Signed, Olga MillersBrian Crenshaw, MD  09/17/2017, 9:04 AM

## 2017-09-17 NOTE — Discharge Summary (Signed)
Physician Discharge Summary  Victoria Gomez WUJ:[id number] DOB: 03/07/1925 DOA: 09/13/2017  PCP: Shelva Majestic, MD  Admit date: 09/13/2017 Discharge date: 09/17/2017  Time spent:35 minutes  Recommendations for Outpatient Follow-up:   Acute respiratory failure with hypoxia -Resolved   Acute on chronic systolic and diastolic CHF  -Diuretics being titrated by cardiology -EF 40-45% per TTE this admission with grade 2 diastolic dysfunction -Cardiac catheterization pending  -Strict I&O -Daily weight -Apixaban 2.5 mg BID per Dr Antoine Poche  would readdress dose at FU as she should be on 5 mg BID based on dosing guide historically  -Imdur 90 mg daily -Metoprolol 25 mg BID -schedule follow-up with Dr.  Rollene Rotunda in 1-2 weeks for acute or chronic systolic and diastolic CHF, hypertensive emergency permanent atrial fibrillation.   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 Tana Conch  Diabetes type 2 controlled with renal complications, CKD stage III, hypothyroidism, HLD   Hypothyroidism -Continue home Synthroid dose   HLD  -Lipid panel pending   Discharge Diagnoses:  Principal Problem:   Acute respiratory failure with hypoxemia (HCC) Active Problems:   Hypothyroidism   Hyperlipidemia   Permanent atrial fibrillation (HCC)   CKD (chronic kidney disease), stage III (HCC)   Diabetes mellitus II   Acute on chronic combined systolic and diastolic CHF (congestive heart failure) (HCC)   Hypertensive heart and kidney disease with  acute on chronic combined systolic and diastolic congestive heart failure and stage 3 chronic kidney disease (HCC)   Discharge Condition: Stable  Diet recommendation: Heart healthy/American diabetic Association  Filed Weights   09/14/17 0410 09/15/17 0516 09/16/17 0345  Weight: 166 lb 11.2 oz (75.6 kg) 160 lb 4.8 oz (72.7 kg) 160 lb 14.4 oz (73 kg)    History of present illness:  83 y.o.WF PMHx CAD  S/P percutaneous coronary intervention, severe HTN, chronic systolic and diastolic CHF, permanent atrial fibrillation on Eliquis, HLD, hypothyroidism, Diabetes Type 2    Presented from home with complaints of acute shortness of breath. Upon arrival of EMS patient was in extremis and immediately placed on BiPAP.  BP was 230 systolic during transport and her respiratory status declined. She was started on a nitroglycerin drip.  For this hospitalization patient was transferred acute respiratory hypoxia, acute on chronic systolic and diastolic CHF and emergency hypertension. Patient had LEFT heart cardiac catheterization which showed 2 vessel CAD. Treated medically per cardiology recommendations. Responded well. Stable for discharge   Procedures: 11/7 LEFT Heart Cardiac Catheterization:-2 vessel obstructive CAD(- 100% ostial RCA with left to right collaterals. This is a CTO - 70% OM1.) -. Normal LVEDP     Consultations: Cardiology    Discharge Exam: Vitals:   09/17/17 0634 09/17/17 0754 09/17/17 0855 09/17/17 1105  BP: 139/76 (!) 155/77 (!) 153/81 124/77  Pulse: 78 (!) 112 100 80  Resp: 20 18    Temp: 97.9 F (36.6 C) 98 F (36.7 C)  99.4 F (37.4 C)  TempSrc: Oral Oral  Oral  SpO2: 95% 97%  92%  Weight:      Height:        General: A/O 4, No acute respiratory distress Neck:  Negative scars, masses, torticollis, lymphadenopathy, JVD Lungs: Clear to auscultation bilaterally without wheezes or crackles Cardiovascular: Regular rhythm and rate without murmur gallop or rub normal  S1 and S2 Abdomen: negative abdominal pain, nondistended, positive soft, bowel sounds, no rebound, no ascites, no appreciable mass   Discharge Instructions   Allergies as of 09/17/2017      Reactions   Codeine Hives, Nausea And Vomiting   Sulfonamide Derivatives Swelling   Turns red      Medication List    STOP taking these medications   KLOR-CON 10 10 MEQ tablet Generic drug:  potassium chloride Replaced by:  potassium chloride 10 MEQ tablet   losartan 50 MG tablet Commonly known as:  COZAAR     TAKE these medications   acetaminophen 500 MG tablet Commonly known as:  TYLENOL Take 500 mg by mouth every 6 (six) hours as needed for mild pain. Reported on 12/12/2015   apixaban 2.5 MG Tabs tablet Commonly known as:  ELIQUIS Take 1 tablet (2.5 mg total) by mouth 2 (two) times daily.   diclofenac sodium 1 % Gel Commonly known as:  VOLTAREN Apply 2 g topically 4 (four) times daily.   furosemide 20 MG tablet Commonly known as:  LASIX Take 3 tablets (60 mg total) daily by mouth. What changed:    medication strength  See the new instructions.   glucose blood test strip Commonly known as:  ONETOUCH VERIO USE TO TEST BLOOD SUGARS DAILY. DX: E11.9   isosorbide mononitrate 30 MG 24 hr tablet Commonly known as:  IMDUR Take 3 tablets (90 mg total) daily by mouth. Start taking on:  09/18/2017 What changed:    medication strength  See the new instructions.   lansoprazole 30 MG capsule Commonly known as:  PREVACID TAKE 1 CAPSULE TWICE DAILY   levothyroxine 50 MCG tablet Commonly known as:  SYNTHROID Take 1 tablet (50 mcg total) by mouth daily before breakfast.   metoprolol tartrate 25 MG tablet Commonly known as:  LOPRESSOR Take 1 tablet (25 mg total) 2 (two) times daily by mouth. What changed:    how much to take  additional instructions   nitroGLYCERIN 0.4 MG SL tablet Commonly known as:  NITROSTAT ONE UNDER THE TONGUE EVERY 5 MINUTES AS NEEDED FOR CHEST  PAIN. IF PAIN AFTER 3 PILLS, CALL 911   ONETOUCH DELICA LANCETS 33G Misc Use to test blood sugars daily. Dx: E11.9   pentosan polysulfate 100 MG capsule Commonly known as:  ELMIRON Take 100 mg by mouth 2 (two) times daily.   potassium chloride 10 MEQ tablet Commonly known as:  K-DUR,KLOR-CON Take 1 tablet (10 mEq total) daily by mouth. Start taking on:  09/18/2017 Replaces:  KLOR-CON 10 10 MEQ tablet   pravastatin 40 MG tablet Commonly known as:  PRAVACHOL TAKE 1 TABLET DAILY   QUEtiapine 25 MG tablet Commonly known as:  SEROQUEL Take 0.5 tablets (12.5 mg total) at bedtime by mouth.      Allergies  Allergen Reactions  . Codeine Hives and Nausea And Vomiting  . Sulfonamide Derivatives Swelling    Turns red   Follow-up Information    Shelva Majestic, MD. Go on 09/24/2017.   Specialty:  Family Medicine Why:  Follow-up appointment Thursday 09/1517 at 3:30pm with Dr. Tana Conch     Diabetes type 2 controlled with renal complications,  CKD stage III, hypothyroidism, HLD   Contact information: [address] [place] [address] [phone]        Rollene RotundaHochrein, James, MD Follow up.   Specialty:  Cardiology Why:  Monday 09/28/17 at 1:30 with Dr. Antoine PocheHochrein. Contact information: [address] [place] [address] [phone]            The results of significant diagnostics from this hospitalization (including imaging, microbiology, ancillary and laboratory) are listed below for reference.    Significant Diagnostic Studies: Dg Chest Portable 1 View  Result Date: 09/13/2017 CLINICAL DATA:  83 year old female with shortness of breath and abnormal pulmonary auscultation. Recent cough. EXAM: PORTABLE CHEST 1 VIEW COMPARISON:  05/10/2014 and earlier. FINDINGS: Portable AP semi upright view at 0812 hours. Diffuse increased pulmonary interstitial opacity, somewhat confluent at the lung bases. Borderline to mild cardiomegaly is chronic. Other  mediastinal contours are within normal limits. Visualized tracheal air column is within normal limits. No pneumothorax. No definite pleural effusion. IMPRESSION: Diffuse bilateral increased pulmonary interstitial opacity. Favor acute viral/atypical pneumonia in this clinical setting with main differential consideration of acute interstitial edema. Electronically Signed   By: Odessa FlemingH  Hall M.D.   On: 09/13/2017 09:07    Microbiology: Recent Results (from the past 240 hour(s))  Urine culture     Status: Abnormal   Collection Time: 09/13/17 12:04 PM  Result Value Ref Range Status   Specimen Description URINE, RANDOM  Final   Special Requests NONE  Final   Culture <10,000 COLONIES/mL INSIGNIFICANT GROWTH (A)  Final   Report Status 09/14/2017 FINAL  Final  MRSA PCR Screening     Status: None   Collection Time: 09/13/17  7:30 PM  Result Value Ref Range Status   MRSA by PCR NEGATIVE NEGATIVE Final    Comment:        The GeneXpert MRSA Assay (FDA approved for NASAL specimens only), is one component of a comprehensive MRSA colonization surveillance program. It is not intended to diagnose MRSA infection nor to guide or monitor treatment for MRSA infections.      Labs: Basic Metabolic Panel: Recent Labs  Lab 09/13/17 0820 09/13/17 1112 09/14/17 0436 09/15/17 0412 09/16/17 0302 09/17/17 0258  NA 138  --  142 140 141 140  K 4.4  --  3.6 3.5 3.4* 4.5  CL 110  --  102 103 102 109  CO2 22  --  27 29 30 22   GLUCOSE 139*  --  118* 121* 110* 107*  BUN 13  --  18 27* 26* 17  CREATININE 0.93 0.93 1.05* 1.44* 1.19* 1.03*  CALCIUM 9.1  --  8.9 8.5* 8.6* 8.8*  MG 1.9  --   --   --   --  1.8   Liver Function Tests: Recent Labs  Lab 09/13/17 0820  AST 21  ALT 11*  ALKPHOS 80  BILITOT 0.4  PROT 7.0  ALBUMIN 3.6   Recent Labs  Lab 09/13/17 0957  LIPASE 26   No results for input(s): AMMONIA in the last 168 hours. CBC: Recent Labs  Lab 09/13/17 0820 09/13/17 1112 09/16/17 0302  09/17/17 0258  WBC 9.1 7.9 8.5 6.8  HGB 12.5 12.8 12.5 12.5  HCT 38.9 39.2 39.1 38.9  MCV 88.8 88.3 89.3 89.0  PLT 225 193 220 201   Cardiac Enzymes: Recent Labs  Lab 09/14/17 1026 09/15/17 0828 09/15/17 1259 09/15/17 1840  TROPONINI 0.18* 0.07* 0.06* 0.05*   BNP: BNP (last 3 results)  Recent Labs    09/13/17 0820  BNP 611.2*    ProBNP (last 3 results) No results for input(s): PROBNP in the last 8760 hours.  CBG: Recent Labs  Lab 09/16/17 1201 09/16/17 1553 09/16/17 2129 09/17/17 0747 09/17/17 1105  GLUCAP 87 89 79 109* 103*       Signed:  Carolyne Littlesurtis Teri Diltz, MD [facility] [phone] pager

## 2017-09-17 NOTE — Care Management Important Message (Signed)
Important Message  Patient Details  Name: Loura HaltMary E Prak MRN: [id number] Date of Birth: December 24, 1924   Medicare Important Message Given:  Yes    Rosely Fernandez Abena 09/17/2017, 1:15 PM

## 2017-09-17 NOTE — Progress Notes (Signed)
Victoria Gomez to be D/C'd  per MD order.  Discussed with the patient and all questions fully answered.  VSS, Skin clean, dry and intact without evidence of skin break down, no evidence of skin tears noted. IV catheter discontinued intact. Site without signs and symptoms of complications. Dressing and pressure applied.  An After Visit Summary was printed and given to the patient. Patient received prescription.  D/c education completed with patient/family including follow up instructions, medication list, d/c activities limitations if indicated, with other d/c instructions as indicated by MD - patient able to verbalize understanding, all questions fully answered.   Patient instructed to return to ED, call 911, or call MD for any changes in condition.   Patient escorted via WC, and D/C home via private auto.  Evern BioLoren D [name] 09/17/2017 5:59 PM

## 2017-09-24 ENCOUNTER — Other Ambulatory Visit: Payer: Self-pay | Admitting: *Deleted

## 2017-09-24 ENCOUNTER — Ambulatory Visit (INDEPENDENT_AMBULATORY_CARE_PROVIDER_SITE_OTHER): Payer: Medicare Other | Admitting: Family Medicine

## 2017-09-24 ENCOUNTER — Other Ambulatory Visit: Payer: Self-pay

## 2017-09-24 ENCOUNTER — Encounter: Payer: Self-pay | Admitting: Family Medicine

## 2017-09-24 VITALS — BP 126/82 | HR 82 | Temp 97.8°F | Ht 63.0 in | Wt 160.4 lb

## 2017-09-24 DIAGNOSIS — I2510 Atherosclerotic heart disease of native coronary artery without angina pectoris: Secondary | ICD-10-CM | POA: Diagnosis not present

## 2017-09-24 DIAGNOSIS — Z9861 Coronary angioplasty status: Secondary | ICD-10-CM | POA: Diagnosis not present

## 2017-09-24 DIAGNOSIS — I10 Essential (primary) hypertension: Secondary | ICD-10-CM | POA: Diagnosis not present

## 2017-09-24 DIAGNOSIS — I5042 Chronic combined systolic (congestive) and diastolic (congestive) heart failure: Secondary | ICD-10-CM

## 2017-09-24 DIAGNOSIS — I482 Chronic atrial fibrillation: Secondary | ICD-10-CM

## 2017-09-24 DIAGNOSIS — I4821 Permanent atrial fibrillation: Secondary | ICD-10-CM

## 2017-09-24 MED ORDER — APIXABAN 2.5 MG PO TABS: 2.5000 mg | ORAL_TABLET | Freq: Two times a day (BID) | ORAL | 1 refills | Status: DC

## 2017-09-24 NOTE — Assessment & Plan Note (Addendum)
Did have bump in troponin but likely due to strain from hypertensive emergency/flash pulmonary edema- no evidence of true MI. Heart cath was updated through Dr. [place]. Had to go through groin- wrist attempt unsuccessful.  Her aspirin was discontinued given also on eliquis- will defer to cardiology "  Prox LAD lesion is 45% stenosed.  1st Mrg lesion is 70% stenosed.  Ost RCA to Prox RCA lesion is 100% stenosed.   1. 2 vessel obstructive CAD    - 100% ostial RCA with left to right collaterals. This is a CTO    - 70% OM1. 2. Normal LVEDP  Plan: I would recommend medical therapy. Her presentation was with acute respiratory failure and CHF with minimal troponin elevation due to demand ischemia. While the OM could be stented this would incur additional procedural risk in a 83 year old and would require DAPT as well as antithrombotic therapy increasing her risk of bleeding. "  We will continue to work to control BP, lipids. Is not on aspirin due to being on eliquis- defer to cardiology

## 2017-09-24 NOTE — Patient Outreach (Signed)
[facility] Care Management  09/24/2017  Victoria HaltMary E Gomez 1924-12-13 [id number]     EMMI-CHF RED ON EMMI ALERT Day # 2 Date: 09/23/17 Red Alert Reason: "Weighed themselves today? No"   Outreach attempt # 1 to patient. Spoke with patient who states he doing fairly well except for some weakness. She denies that weakness is worse. She reports some occasional SOB with exertion and while lying flat. She is using pillows. RN CM encouraged patient to try adding additional pillow. She goes for PCP f/u appt. RN CM instructed to discuss her concerns with MD during appt and she voiced understanding. She goes for cardiologist appt on 09/28/17. She voices that her son lives with her and is supportive and takes her to all appts. Reviewed and addressed red alert with patient. She voices that she is trying to weigh daily. However, yesterday she slept later so had not had a chance to weigh prior to call. She voices that weight is table. She denies any edema. She is adhering to med regimen. She states several meds were changed during hospital stay. RN CM reminded patient to take all pill bottles and discharge paperwork to MD appt today. She voiced understanding. She denies any RN CM needs or concerns at this time. Advised patient that they would continue to get automated EMMI- HF post discharge calls to assess how they are doing following recent hospitalization and will receive a call from a nurse if any of their responses were abnormal. Patient voiced understanding and was appreciative of f/u call.       Plan: RN CM will notify [facility] administrative assistant of case status.    Antionette Fairyoshanda Ketina Mars, RN,BSN,CCM [facility] Care Management Telephonic Care Management Coordinator Direct Phone: [phone] Toll Free: [phone] Fax: [phone]

## 2017-09-24 NOTE — Telephone Encounter (Signed)
Dose of 2.5mg  BID since Nov/2014 per MDs and pharmacist assessment.  Will continue current therapy without changes

## 2017-09-24 NOTE — Progress Notes (Signed)
Subjective:  Victoria Gomez is a 83 y.o. year old very pleasant female patient who presents for/with See problem oriented charting ROS- denies chest pain. Shortness of breath drastically improved. No edema. Does have some cough. No fever.    Past Medical History-  Patient Active Problem List   Diagnosis Date Noted  . Diabetes mellitus II 09/22/2013    Priority: High  . Permanent atrial fibrillation (HCC) 09/21/2013    Priority: High  . H/O sinus bradycardia 08/10/2012    Priority: High  . Combined systolic and diastolic congestive heart failure (HCC) 11/17/2011    Priority: High  . CAD S/P percutaneous coronary angioplasty 08/03/2007    Priority: High  . Osteopenia 06/28/2015    Priority: Medium  . Vitamin B12 deficiency 07/28/2014    Priority: Medium  . CKD (chronic kidney disease), stage III (HCC) 09/22/2013    Priority: Medium  . LBBB (left bundle branch block) 09/22/2013    Priority: Medium  . Interstitial cystitis     Priority: Medium  . Hyperlipidemia 03/07/2008    Priority: Medium  . Hypothyroidism 08/03/2007    Priority: Medium  . Essential hypertension 08/03/2007    Priority: Medium  . Internal hemorrhoids     Priority: Low  . Orthostatic hypotension 08/10/2012    Priority: Low  . Fatigue 04/20/2012    Priority: Low  . ARTHRITIS 12/30/2010    Priority: Low  . Left lumbar radiculopathy 12/11/2010    Priority: Low  . Esophageal reflux 10/18/2007    Priority: Low  . BARRETT'S ESOPHAGUS, HX OF 08/03/2007    Priority: Low  . Acute respiratory failure with hypoxemia (HCC) 09/13/2017  . Acute on chronic combined systolic and diastolic CHF (congestive heart failure) (HCC) 09/13/2017    Class: Acute  . Hypertensive heart and kidney disease with acute on chronic combined systolic and diastolic congestive heart failure and stage 3 chronic kidney disease (HCC) 09/13/2017    Class: Acute  . Chronic anticoagulation 07/09/2017  . Left groin hernia 06/25/2017     Medications- reviewed and updated Current Outpatient Medications  Medication Sig Dispense Refill  . acetaminophen (TYLENOL) 500 MG tablet Take 500 mg by mouth every 6 (six) hours as needed for mild pain. Reported on 12/12/2015    . apixaban (ELIQUIS) 2.5 MG TABS tablet Take 1 tablet (2.5 mg total) 2 (two) times daily by mouth. 180 tablet 1  . diclofenac sodium (VOLTAREN) 1 % GEL Apply 2 g topically 4 (four) times daily. 100 g 3  . furosemide (LASIX) 20 MG tablet Take 3 tablets (60 mg total) daily by mouth. 90 tablet 0  . glucose blood (ONETOUCH VERIO) test strip USE TO TEST BLOOD SUGARS DAILY. DX: E11.9 100 each 3  . isosorbide mononitrate (IMDUR) 30 MG 24 hr tablet Take 3 tablets (90 mg total) daily by mouth. 90 tablet 0  . lansoprazole (PREVACID) 30 MG capsule TAKE 1 CAPSULE TWICE DAILY 180 capsule 1  . levothyroxine (SYNTHROID) 50 MCG tablet Take 1 tablet (50 mcg total) by mouth daily before breakfast. 90 tablet 3  . metoprolol tartrate (LOPRESSOR) 25 MG tablet Take 1 tablet (25 mg total) 2 (two) times daily by mouth. 60 tablet 0  . nitroGLYCERIN (NITROSTAT) 0.4 MG SL tablet ONE UNDER THE TONGUE EVERY 5 MINUTES AS NEEDED FOR CHEST PAIN. IF PAIN AFTER 3 PILLS, CALL 911 25 tablet 1  . ONETOUCH DELICA LANCETS 33G MISC Use to test blood sugars daily. Dx: E11.9 100 each 11  . pentosan  polysulfate (ELMIRON) 100 MG capsule Take 100 mg by mouth 2 (two) times daily.     . potassium chloride (K-DUR,KLOR-CON) 10 MEQ tablet Take 1 tablet (10 mEq total) daily by mouth. 30 tablet 0  . pravastatin (PRAVACHOL) 40 MG tablet TAKE 1 TABLET DAILY 90 tablet 3  . QUEtiapine (SEROQUEL) 25 MG tablet Take 0.5 tablets (12.5 mg total) at bedtime by mouth. 15 tablet 0   No current facility-administered medications for this visit.     Objective: BP 126/82 (BP Location: Left Arm, Patient Position: Sitting, Cuff Size: Large)   Pulse 82   Temp 97.8 F (36.6 C) (Oral)   Ht 5\' 3"  (1.6 m)   Wt 160 lb 6.4 oz (72.8  kg)   SpO2 98%   BMI 28.41 kg/m  Gen: NAD, resting comfortably, intermittent cough- worse when takes deep breaths CV: irregularly irregular no murmurs rubs or gallops Lungs: CTAB no crackles, wheeze, rhonchi Abdomen: overweight Ext: no edema Skin: warm, dry  Assessment/Plan:  Combined systolic and diastolic congestive heart failure (HCC) S: Patient was hospitalized with hypertensive emergency with initial BP over 230. Per son she walked into room with trouble breathing and next thing he knew she became incoherent and he had to call 911. Thought to have flash pulmonary edema and started on Bipap and nitroglycerin drip. She drastically improved on this regimen. Still noted to be fluid over loaded and lasix was increased while hospitalized. Discharged on 60mg  lasix daily with 40mg  an additional dose if weight up 2 lbs. She has not had to use extra dose since discharge.   Had echo updated which shows EF 40% and grade II diastolic dysfunction  She feels pretty well outside of mild cough. Has continued fatigue and low appetite though.  A/P: continue current dose of medication. Discussed low salt diet. Monitoring daily weights.   Of note she does have a lingering cough that apparently started before hospitalization. Lungs were clear on exam. Discussed if she is not improving then I would like to see her back and likely repeat chest x-ray though doubt pneumonia with mild symptoms  Also of note- unfortunately lost her dog while in hospital   Essential hypertension S: controlled on imdur 30mg , metoprolol 25mg  BID and lasix 60mg - meds twaked during hospitalization- prior had been reduced due to tome orthostatic symptoms. In past had toprol xl stopped due to bradycardia but seems to be tolerating metoprolol now BP Readings from Last 3 Encounters:  09/24/17 126/82  09/17/17 124/77  08/03/17 (!) 143/80  A/P: We discussed blood pressure goal of <140/90. Continue current meds:  Denies orthostatic  symptoms fortunately   CAD S/P percutaneous coronary angioplasty Did have bump in troponin but likely due to strain from hypertensive emergency/flash pulmonary edema- no evidence of true MI. Heart cath was updated through Dr. [place]. Had to go through groin- wrist attempt unsuccessful.  Her aspirin was discontinued given also on eliquis- will defer to cardiology "  Prox LAD lesion is 45% stenosed.  1st Mrg lesion is 70% stenosed.  Ost RCA to Prox RCA lesion is 100% stenosed.   1. 2 vessel obstructive CAD    - 100% ostial RCA with left to right collaterals. This is a CTO    - 70% OM1. 2. Normal LVEDP  Plan: I would recommend medical therapy. Her presentation was with acute respiratory failure and CHF with minimal troponin elevation due to demand ischemia. While the OM could be stented this would incur additional procedural risk in a  [age] year old and would require DAPT as well as antithrombotic therapy increasing her risk of bleeding. "  We will continue to work to control BP, lipids. Is not on aspirin due to being on eliquis- defer to cardiology  Permanent atrial fibrillation [facility](HCC) Patient was on heparin drip in hospital before being placed back on eliquis 2.5mg  BID- notes suggest cards will review at next visit to consider if she can go back to 5mg  BID. Had ordered bmet today for patient to try to help with crcl but patient was not directed to lab so will likely need labs updated next week- though patient may be able to come back for labs. Appears in a fib today- but rate controlled on metoprolol 25mg  BID up from 12.5mg  BID- will watch for bradycardia as had in past on metoprolol XL  Prior acute respiratory failure with hypoxia now resolved. No chronic oxygen need  Future Appointments  Date Time Provider Department Center  09/28/2017  1:30 PM Lorin MercyKilroy, Luke K, PA-C [facility]  12/28/2017  2:15 PM Durene CalHunter, Aldine ContesStephen O, MD [facility] None   Orders Placed This Encounter  Procedures   . Basic metabolic panel    Standing Status:   Future    Standing Expiration Date:   09/24/2018  . CBC    Standing Status:   Future    Standing Expiration Date:   09/24/2018   Return precautions advised.  Tana ConchStephen Jered Heiny, MD

## 2017-09-24 NOTE — Assessment & Plan Note (Signed)
S: controlled on imdur 30mg , metoprolol 25mg  BID and lasix 60mg - meds twaked during hospitalization- prior had been reduced due to tome orthostatic symptoms. In past had toprol xl stopped due to bradycardia but seems to be tolerating metoprolol now BP Readings from Last 3 Encounters:  09/24/17 126/82  09/17/17 124/77  08/03/17 (!) 143/80  A/P: We discussed blood pressure goal of <140/90. Continue current meds:  Denies orthostatic symptoms fortunately

## 2017-09-24 NOTE — Assessment & Plan Note (Addendum)
S: Patient was hospitalized with hypertensive emergency with initial BP over 230. Per son she walked into room with trouble breathing and next thing he knew she became incoherent and he had to call 911. Thought to have flash pulmonary edema and started on Bipap and nitroglycerin drip. She drastically improved on this regimen. Still noted to be fluid over loaded and lasix was increased while hospitalized. Discharged on 60mg  lasix daily with 40mg  an additional dose if weight up 2 lbs. She has not had to use extra dose since discharge.   Had echo updated which shows EF 40% and grade II diastolic dysfunction  She feels pretty well outside of mild cough. Has continued fatigue and low appetite though.  A/P: continue current dose of medication. Discussed low salt diet. Monitoring daily weights.   Of note she does have a lingering cough that apparently started before hospitalization. Lungs were clear on exam. Discussed if she is not improving then I would like to see her back and likely repeat chest x-ray though doubt pneumonia with mild symptoms  Also of note- unfortunately lost her dog while in hospital

## 2017-09-24 NOTE — Patient Instructions (Addendum)
Please watch your salt intake particularly through the holidays  Continue lasix 60mg  a day. If you gain 2 lbs - add an extra 40mg  six hours later  For cough can try delsym. If your symptoms worsen with fever, shortness of breath, worsening cough see me back. Suspect will resolve in another week. Could also try low dose prednisone for post viral cough. Would likely want to get another x-ray before that though.   Sorry about your dog

## 2017-09-24 NOTE — Assessment & Plan Note (Signed)
Patient was on heparin drip in hospital before being placed back on eliquis 2.5mg  BID- notes suggest cards will review at next visit to consider if she can go back to 5mg  BID. Had ordered bmet today for patient to try to help with crcl but patient was not directed to lab so will likely need labs updated next week- though patient may be able to come back for labs. Appears in a fib today- but rate controlled on metoprolol 25mg  BID up from 12.5mg  BID- will watch for bradycardia as had in past on metoprolol XL

## 2017-09-28 ENCOUNTER — Encounter: Payer: Self-pay | Admitting: Cardiology

## 2017-09-28 ENCOUNTER — Ambulatory Visit (INDEPENDENT_AMBULATORY_CARE_PROVIDER_SITE_OTHER): Payer: Medicare Other | Admitting: Cardiology

## 2017-09-28 VITALS — BP 123/72 | HR 90 | Wt 159.2 lb

## 2017-09-28 DIAGNOSIS — Z7901 Long term (current) use of anticoagulants: Secondary | ICD-10-CM | POA: Diagnosis not present

## 2017-09-28 DIAGNOSIS — I482 Chronic atrial fibrillation: Secondary | ICD-10-CM

## 2017-09-28 DIAGNOSIS — Z79899 Other long term (current) drug therapy: Secondary | ICD-10-CM

## 2017-09-28 DIAGNOSIS — Z9861 Coronary angioplasty status: Secondary | ICD-10-CM

## 2017-09-28 DIAGNOSIS — I2510 Atherosclerotic heart disease of native coronary artery without angina pectoris: Secondary | ICD-10-CM

## 2017-09-28 DIAGNOSIS — I5042 Chronic combined systolic (congestive) and diastolic (congestive) heart failure: Secondary | ICD-10-CM

## 2017-09-28 DIAGNOSIS — I10 Essential (primary) hypertension: Secondary | ICD-10-CM | POA: Diagnosis not present

## 2017-09-28 DIAGNOSIS — I5043 Acute on chronic combined systolic (congestive) and diastolic (congestive) heart failure: Secondary | ICD-10-CM | POA: Diagnosis not present

## 2017-09-28 DIAGNOSIS — I4821 Permanent atrial fibrillation: Secondary | ICD-10-CM

## 2017-09-28 NOTE — Assessment & Plan Note (Signed)
CAF with CVR- she had bradycardia on Toprol, now on metoprolol 12.5 mg BID--> now to 25mg  BID

## 2017-09-28 NOTE — Assessment & Plan Note (Addendum)
She was on Valsartan 320 mg (recalled). This was changed to losartan which was stopped secondary to orhtostatic symptoms. Toprol stopped in the past due to bradycardia, changed to Metoprolol

## 2017-09-28 NOTE — Patient Instructions (Signed)
Medication Instructions:  NO CHANGES If you need a refill on your cardiac medications before your next appointment, please call your pharmacy.  Labwork: BMET TODAY HERE IN OUR OFFICE AT [facility]  Follow-Up: Your physician wants you to follow-up in: 5-6 MONTHS WITH [facility]. You should receive a reminder letter in the mail two months in advance. If you do not receive a letter, please call our office MARCH 2019 to schedule the MAY 2019 follow-up appointment.   [facility]!!

## 2017-09-28 NOTE — Assessment & Plan Note (Signed)
09/14/17- EF 40-45%. Lasix 60mg  daily

## 2017-09-28 NOTE — Progress Notes (Signed)
09/28/2017 Victoria Gomez   08/05/1925  [id number]  Primary Physician Durene CalHunter, Aldine ContesStephen O, MD Primary Cardiologist: Dr Antoine PocheHochrein  HPI:  Pleasant 83 y/o female, lives with her son, with chronic combined systolic/diastolic congestive heart failure,LBBB, HTN with HCVD, HLD,CAD, and CAF. She has a history of remote PCI of her RCA in 2001, ISR 2002. This was known to be occluded at her last catheterization in 2012. Her blood pressure medications were decreased recently because of dizziness.  The pt was aditted 09/13/17 with flash pulmonary edema and hypertension. After admission she eveloped CP with new anterolateral TWI and a mildly elevated troponin. Cath done 09/16/17 showed a CTO of her RCA with L-R collaterals, 45% LAD, and 70% OM1.  The plan is for medical Rx. She is in the office today for follow up. She has done well since discharge 09/17/17. No unusual chest pain, no orthopnea or dyspnea. She has a chronic dry non productive cough, worse at night (? GERD). She saw [name] recently and is to see him again in February. .    Current Outpatient Medications  Medication Sig Dispense Refill  . acetaminophen (TYLENOL) 500 MG tablet Take 500 mg by mouth every 6 (six) hours as needed for mild pain. Reported on 12/12/2015    . apixaban (ELIQUIS) 2.5 MG TABS tablet Take 1 tablet (2.5 mg total) 2 (two) times daily by mouth. 180 tablet 1  . diclofenac sodium (VOLTAREN) 1 % GEL Apply 2 g topically 4 (four) times daily. 100 g 3  . furosemide (LASIX) 20 MG tablet Take 3 tablets (60 mg total) daily by mouth. 90 tablet 0  . glucose blood (ONETOUCH VERIO) test strip USE TO TEST BLOOD SUGARS DAILY. DX: E11.9 100 each 3  . isosorbide mononitrate (IMDUR) 30 MG 24 hr tablet Take 3 tablets (90 mg total) daily by mouth. 90 tablet 0  . lansoprazole (PREVACID) 30 MG capsule TAKE 1 CAPSULE TWICE DAILY 180 capsule 1  . levothyroxine (SYNTHROID) 50 MCG tablet Take 1 tablet (50 mcg total) by mouth daily before breakfast.  90 tablet 3  . metoprolol tartrate (LOPRESSOR) 25 MG tablet Take 1 tablet (25 mg total) 2 (two) times daily by mouth. 60 tablet 0  . nitroGLYCERIN (NITROSTAT) 0.4 MG SL tablet ONE UNDER THE TONGUE EVERY 5 MINUTES AS NEEDED FOR CHEST PAIN. IF PAIN AFTER 3 PILLS, CALL 911 25 tablet 1  . ONETOUCH DELICA LANCETS 33G MISC Use to test blood sugars daily. Dx: E11.9 100 each 11  . pentosan polysulfate (ELMIRON) 100 MG capsule Take 100 mg by mouth 2 (two) times daily.     . potassium chloride (K-DUR,KLOR-CON) 10 MEQ tablet Take 1 tablet (10 mEq total) daily by mouth. 30 tablet 0  . pravastatin (PRAVACHOL) 40 MG tablet TAKE 1 TABLET DAILY 90 tablet 3  . QUEtiapine (SEROQUEL) 25 MG tablet Take 0.5 tablets (12.5 mg total) at bedtime by mouth. 15 tablet 0   No current facility-administered medications for this visit.     Allergies  Allergen Reactions  . Codeine Hives and Nausea And Vomiting  . Sulfonamide Derivatives Swelling    Turns red    Past Medical History:  Diagnosis Date  . Arthritis   . Atrial fibrillation (HCC)    a. Dx 09/2013.  [name]. BARRETT'S ESOPHAGUS, HX OF   . Bladder disorder   . Bradycardia 08/2012   a. Eval for symptomatic bradycardia 08/2012 - beta blocker discontinued.  [name]. BREAST MASS, BENIGN   . CAD (  coronary artery disease)    a. s/p PCI to RCA '01. b. repeat PCI to RCA '03 2/2 ISR. c. cath 2012: RCA dz, rx medically.  Occluded RCA.  Nonobstructive disease elsewhere  . Chronic combined systolic and diastolic CHF (congestive heart failure) (HCC)    a. EF 40-45% in 2014. b. EF reported to be normal in 03/2015 nuc.  . CKD (chronic kidney disease), stage III (HCC)   . DIVERTICULOSIS, COLON   . Epistaxis   . Esophageal reflux   . History of breast lump/mass excision 05/23/2009   Qualifier: History of  By: Lovell Sheehan MD, Balinda Quails   . HYPERLIPIDEMIA 03/07/2008  . HYPERTENSION 08/03/2007  . HYPOTHYROIDISM   . Internal hemorrhoids   . Interstitial cystitis   . LBBB (left bundle branch  block)   . Orthostatic hypotension 08/2012   a. 08/2012 w/ syncope - meds adjusted.   . Right hand fracture     Social History   Socioeconomic History  . Marital status: Widowed    Spouse name: Not on file  . Number of children: 3  . Years of education: Not on file  . Highest education level: Not on file  Social Needs  . Financial resource strain: Not on file  . Food insecurity - worry: Not on file  . Food insecurity - inability: Not on file  . Transportation needs - medical: Not on file  . Transportation needs - non-medical: Not on file  Occupational History  . Occupation: retired  Tobacco Use  . Smoking status: Former Smoker    Packs/day: 1.00    Years: 15.00    Pack years: 15.00    Types: Cigarettes    Last attempt to quit: 11/10/1969    Years since quitting: 47.9  . Smokeless tobacco: Never Used  Substance and Sexual Activity  . Alcohol use: No    Alcohol/week: 0.0 oz  . Drug use: No  . Sexual activity: No    Birth control/protection: Post-menopausal  Other Topics Concern  . Not on file  Social History Narrative   Widowed 05/2013. 2nd marriage. First husband died of cancer. 3 sons-[name]- 2 sons, 2 twin girls, [name]-1 son, no grandsons. [name]-1 son, 1 daughter, no grandkids.       LIves with youngest son (has cancer). Son drives her but has license. Independent IADLs.       Retired from [employer] after 33 years.       Hobbies: crocheting, used to bowl with husband, reading      Living will: currently wants resuscitation.      Family History  Problem Relation Age of Onset  . Colon cancer Mother   . Coronary artery disease Mother   . Heart disease Mother   . Coronary artery disease Father   . Heart disease Father   . Colon polyps Sister   . Coronary artery disease Sister   . Diabetes type II Son   . Coronary artery disease Brother      Review of Systems: General: negative for chills, fever, night sweats or weight changes.  Cardiovascular: negative  for chest pain, dyspnea on exertion, edema, orthopnea, palpitations, paroxysmal nocturnal dyspnea or shortness of breath Dermatological: negative for rash Respiratory: negative for cough or wheezing Urologic: negative for hematuria Abdominal: negative for nausea, vomiting, diarrhea, bright red blood per rectum, melena, or hematemesis Neurologic: negative for visual changes, syncope, or dizziness All other systems reviewed and are otherwise negative except as noted above.    Blood pressure  123/72, pulse 90, weight 159 lb 3.2 oz (72.2 kg).  General appearance: alert, cooperative and no distress Neck: no carotid bruit and no JVD Lungs: few crackles Lt base Heart: irregularly irregular rhythm Extremities: trace LE edema Skin: Skin color, texture, turgor normal. No rashes or lesions Neurologic: Grossly normal   ASSESSMENT AND PLAN:   Acute on chronic combined systolic and diastolic CHF (congestive heart failure) (HCC) Pt admitted with flash pulmonary edema 09/13/17-09/17/17 Seen today in follow up- stable  Essential hypertension She was on Valsartan 320 mg (recalled). This was changed to losartan which was stopped secondary to orhtostatic symptoms. Toprol stopped in the past due to bradycardia, changed to Metoprolol  CAD S/P percutaneous coronary angioplasty - cath 09/16/17- medical management only - prior history PCI to RCA '01, repeat PCI to RCA '03 2/2 ISR - cath 09/16/17 showed an occluded RCA, minor LAD disease and 70% OM1-plan for med rx.   Combined systolic and diastolic congestive heart failure (HCC) 09/14/17- EF 40-45% by echo Lasix 60mg  daily   Permanent atrial fibrillation Rate controlled   Chronic anticoagulation Eliquis 2.5 mg BID- wgt 28kg, last SCr 1.03, age 83    PLAN  Same Rx. Her B/P is controlled on her current medications. I ordered a BMP today, further adjustment of her medications based on this.   I'll ask Dr Antoine PocheHochrein about her Eliquis-(? full dose). ASA  has been stopped.    I'll arrange for her to see dr Antoine PocheHochrein a few months after she sees [name].   Corine ShelterLuke Chelci Wintermute PA-C 09/28/2017 2:11 PM

## 2017-09-28 NOTE — Assessment & Plan Note (Signed)
Pt admitted with flash pulmonary edema 09/13/17 Seen today if follow up- stable

## 2017-09-28 NOTE — Assessment & Plan Note (Signed)
-   cath 09/16/17- medical management only - prior history PCI to RCA '01, repeat PCI to RCA '03 2/2 ISR - occluded RCA, minor LAD disease and 70% OM1-plan for med rx.

## 2017-09-28 NOTE — Assessment & Plan Note (Signed)
Currently on low dose Eliquis

## 2017-09-29 ENCOUNTER — Other Ambulatory Visit: Payer: Self-pay | Admitting: Cardiology

## 2017-09-29 LAB — BASIC METABOLIC PANEL
BUN/Creatinine Ratio: 20 (ref 12–28)
BUN: 20 mg/dL (ref 10–36)
CO2: 24 mmol/L (ref 20–29)
Calcium: 9.6 mg/dL (ref 8.7–10.3)
Chloride: 103 mmol/L (ref 96–106)
Creatinine, Ser: 1.02 mg/dL — ABNORMAL HIGH (ref 0.57–1.00)
GFR calc Af Amer: 56 mL/min/{1.73_m2} — ABNORMAL LOW (ref >59)
GFR calc non Af Amer: 49 mL/min/{1.73_m2} — ABNORMAL LOW (ref >59)
Glucose: 115 mg/dL — ABNORMAL HIGH (ref 65–99)
Potassium: 4.5 mmol/L (ref 3.5–5.2)
Sodium: 143 mmol/L (ref 134–144)

## 2017-09-29 NOTE — Telephone Encounter (Signed)
REFILL 

## 2017-10-02 ENCOUNTER — Other Ambulatory Visit: Payer: Self-pay | Admitting: *Deleted

## 2017-10-02 ENCOUNTER — Encounter (HOSPITAL_COMMUNITY): Payer: Self-pay

## 2017-10-02 ENCOUNTER — Emergency Department (HOSPITAL_COMMUNITY): Payer: Medicare Other

## 2017-10-02 ENCOUNTER — Emergency Department (HOSPITAL_COMMUNITY)
Admission: EM | Admit: 2017-10-02 | Discharge: 2017-10-02 | Disposition: A | Discharge status: Home / Self Care | Payer: Medicare Other | Attending: Emergency Medicine | Admitting: Emergency Medicine

## 2017-10-02 ENCOUNTER — Ambulatory Visit: Payer: Self-pay

## 2017-10-02 ENCOUNTER — Other Ambulatory Visit: Payer: Self-pay

## 2017-10-02 DIAGNOSIS — Z5321 Procedure and treatment not carried out due to patient leaving prior to being seen by health care provider: Secondary | ICD-10-CM | POA: Insufficient documentation

## 2017-10-02 DIAGNOSIS — R05 Cough: Secondary | ICD-10-CM | POA: Diagnosis not present

## 2017-10-02 NOTE — Patient Outreach (Signed)
Patient triggered Red on [facility] Heart Failure Dashboard, notification sent to Juanita Futrell, RN 

## 2017-10-02 NOTE — Telephone Encounter (Signed)
Pt calling stating she has had a "bad cough" since 2 weeks ago. Pt states she has coughing spells and will cough up frothy sputum and white phlegm. Pt states that when she is lying in bed that the frothy sputum " just comes up" Pt C/O shortness of breath with exertion and mid chest pain that is present with or without  coughing.  She has been taking OTC Delsyn for cough but states it has not gotten any better. Pt has h/o CHF, and a fib and was in the hospital 09/11/17. Pt advised her to go [place] asap to be evaluated considering symptoms and hx.  Reason for Disposition . Chest pain  (Exception: MILD central chest pain, present only when coughing)  Answer Assessment - Initial Assessment Questions 1. ONSET: "When did the cough begin?"      2 weeks ago 2. SEVERITY: "How bad is the cough today?"      Having coughing spells  3. RESPIRATORY DISTRESS: "Describe your breathing."      breathing about the same  Has trouble sleeping at night 4. FEVER: "Do you have a fever?" If so, ask: "What is your temperature, how was it measured, and when did it start?"     No fever 5. SPUTUM: "Describe the color of your sputum" (clear, white, yellow, green)     Frothy and white 6. HEMOPTYSIS: "Are you coughing up any blood?" If so ask: "How much?" (flecks, streaks, tablespoons, etc.)     No  7. CARDIAC HISTORY: "Do you have any history of heart disease?" (e.g., heart attack, congestive heart failure)       ? H/o heart attack, CHF and afib 8. LUNG HISTORY: "Do you have any history of lung disease?"  (e.g., pulmonary embolus, asthma, emphysema)     No  9. PE RISK FACTORS: "Do you have a history of blood clots?" (or: recent major surgery, recent prolonged travel, bedridden )     No  10. OTHER SYMPTOMS: "Do you have any other symptoms?" (e.g., runny nose, wheezing, chest pain)       Chest pain middle of chest x few days there with or without coughing 11. PREGNANCY: "Is there any chance you are pregnant?" "When was your  last menstrual period?"       n/a 12. TRAVEL: "Have you traveled out of the country in the last month?" (e.g., travel history, exposures)       no  Protocols used: COUGH - ACUTE PRODUCTIVE-A-AH

## 2017-10-02 NOTE — Patient Outreach (Signed)
[facility] Care Management  10/02/2017  Victoria HaltMary E Gomez 07/28/1925 [id number]  EMMI-CHF RED ON EMMI ALERT Day # 10 Date: 10/01/17 Red Alert Reason: Weighed themselves today? Yes   Weight: 180 lbs Do others at home smoke? Yes   Outreach attempt #1 to patient. No answer. RN CM left HIPAA compliant message along with contact info.     Plan: RN CM will contact patient within one week.  Wynelle ClevelandJuanita Nat Lowenthal, RN, BSN, MHA/MSL, [facility] Telephonic Care Manager Coordinator [facility] Direct Phone: [phone] Toll Free: [phone] Fax: [phone]

## 2017-10-02 NOTE — ED Triage Notes (Signed)
Per Pt, Pt is coming form home with complaints of cough x 2 weeks. Pt reports one week ago she came in with SOB and had a Cardiac Catheter. Pt had follow up with PCP and was told to come in for persistent cough. Denies CP or SOB. Reports some white sputum.

## 2017-10-05 ENCOUNTER — Other Ambulatory Visit: Payer: Self-pay | Admitting: *Deleted

## 2017-10-05 ENCOUNTER — Ambulatory Visit: Payer: Self-pay | Admitting: *Deleted

## 2017-10-05 NOTE — Patient Outreach (Signed)
[facility](THN) Care Management  10/05/2017  Victoria Gomez [birth year] [id number]  EMMI-CHF RED ON EMMI ALERT Day #10 Date:10/01/17 Red Alert Reason:Weighed themselves today? Yes   Weight: 180 lbs Do others at home smoke? Yes  Date: 10/03/17 New or worsening problems? Yes  Outgoing telephone call to patient. HIPAA identifiers verified with patient. Patient confirmed other individuals living in her home smokes. She stated, her family doesn't smoke around her. Per patient, the EMMI automated questionnaire recorded the incorrect answer about her weight. She stated, "She told the machine the answer was wrong, but it wouldn't listen". Patient reported her weight at 158 pounds. She stated, her weight has been consistent since she was discharged from the hospital. Patient acknowledged having new or worsening problems. Patient discussed having a constant cough and pain in her throat over the last 2 weeks. She verbalized not being able to sleep/rest due to her coughing. She contacted the MD's office about her concerns. The MD's office advised patient to go to the emergency room. Patient stated, she waited in the emergency room for 2 hours and had another 4 hours before being assessed. She decided to leave the hospital due to the wait time. Prior to leaving the emergency room, she had a chest x-ray. Per patient, she asked the staff if the results could be downloaded on My Chart. Patient received the results and she was concerned about Aortic Atherosclerosis. RN CM encouraged patient to speak with her Cardiologist about the results of her x-ray. She was relieved that she didn't have Pneumonia. RN CM encouraged patient to contact her primary MD's office to arrange a sick appointment. Patient is concerned about her health. Prior to her last hospitalization, patient had difficulty breathing. She "passed out at home and her son had to call the ambulance". She was intubated/extubated during her  hospital stay. Patient has a past medical history of CHF, DM, LBBB, HTN, HLD, CAD, CAF, Barrett's Esophagus, and Diverticulosis. [facility] services and benefits explained to patient and patient agreed to services.   Plan: RN CM advised patient to contact RNCM for any needs or concerns. RN CM advised patient to alert MD for any changes in conditions.  RN CM will send [facility] to [facility] for further in home eval/assessment of care needs and management of chronic conditions. RN CM will send [facility] to [facility] for transition of care.   Victoria ClevelandJuanita Shermaine Rivet, RN, BSN, MHA/MSL, [facility] Telephonic Care Manager Coordinator [facility] Direct Phone: [phone] Toll Free: [phone] Fax: [phone]

## 2017-10-05 NOTE — Telephone Encounter (Signed)
Pt was seen in ER on 10/02/2017, will forward this note to Dr. Durene CalHunter.

## 2017-10-07 ENCOUNTER — Encounter: Payer: Self-pay | Admitting: *Deleted

## 2017-10-07 ENCOUNTER — Other Ambulatory Visit: Payer: Self-pay | Admitting: *Deleted

## 2017-10-07 NOTE — Patient Outreach (Addendum)
[facility]N) Care Management  10/07/2017  Victoria Gomez 04/22/1925 [id number]    Transition of care  RN spoke with pt today and confirmed identifiers and the purpose for today's call. RN discussed the [facility] program and services related to case management services. Reviewed available programs with [facility] and offered community home visits however pt feels she does not need any home visits at this time however receptive to telephone transition of care over the next few weeks. RN discussed HF as pt not aware of most of the symptoms and what to do. RN further engaged and discussed the HF zones and verified pt is in the GREEN zone. Pt repors her weights yesterday 156 lbs, today 157 lbs and last week at 159 lbs. Pt denies any swelling, SOB with no problems consuming her meals and always follow a low sodium diet. Further educated pt on the HF zones and when to seek medical attention if acute symptoms should occur. Discussed the GREEN zone and the importance of fluid retention if 3 lbs gained over night or 5 lbs gained over one week with any symptom of SOB or swelling to her legs, hands or trunk area. Pt is aware to seek medical attention with changes in these area.  Based upon pt's lack of knowledge RN generated a care plan around HF and goals related. Pt receptive and has a clear understanding with no question today. Pt very appreciative for the information provided and indicated she would participate. RN requested additional time to completed a telephone assessment on the next call as pt receptive to a follow up next Monday.  No other inquires or request at this time as pt again receptive to telephonic case management.   Elliot CousinLisa Deannie Resetar, RN Care Management Coordinator [facility] Main Office [phone]

## 2017-10-12 ENCOUNTER — Other Ambulatory Visit: Payer: Self-pay | Admitting: *Deleted

## 2017-10-12 NOTE — Patient Outreach (Signed)
[facility](THN) Care Management  10/12/2017  Victoria Gomez 11/16/1924 [id number]    Rn attempted outreach call to completed a transition of care and inquired in the EMMI RED on 12/2 however unsuccessful. Will continue outreach calls accordingly and attempt to inquired further.  Elliot CousinLisa Naiara Lombardozzi, RN Care Management Coordinator [facility] Main Office [phone]

## 2017-10-13 ENCOUNTER — Other Ambulatory Visit: Payer: Self-pay | Admitting: *Deleted

## 2017-10-13 NOTE — Patient Outreach (Signed)
[facility](THN) Care Management  10/13/2017  Loura HaltMary E Pugh 1925-08-24 [id number]  TRANSITION OF CARE AND FOLLOW UP ON EMMI REPORTS  RN attempted another outreach call today and was able to talk with pt. RN verified identifiers and reintroduced the [facility] services and purpose for today's call. RN discussed the EMMI call related to the discrepancies in her reported weights. Pt verified on 12/1 she did not weight in at 148 lbs as noted. Pt reported her weights over the last week as noted:  Thursday 11/29 --157 lbs Friday 11/30--158.6 lbs Saturday 12/1--157.8  lbs Sunday 12/2-- 159 lbs Monday 12/3-- 159 lbs Tuesday 12/4 159 lbs   Pt denies any symptoms with no reported swelling or discomfort in her breathing. RN has informed pt that RN will follow up with any abnormal readings related to her EMMI input (pt receptive). RN requested to complete a telephone assessment on the next follow up call next week and scheduled an appointment based upon pt's requested day and time. Will call pt next week and completed the initial assessment and completed a transition of care contact. RN reviewed the generated plan of care and updated the interventions accordingly as pt continue to do well in following this plan of care. No inquires or request at this time as RN will continue to assist pt in managing her HF.  Elliot CousinLisa Nicholette Dolson, RN Care Management Coordinator [facility] Main Office [phone]

## 2017-10-15 ENCOUNTER — Emergency Department (HOSPITAL_COMMUNITY): Payer: Medicare Other

## 2017-10-15 ENCOUNTER — Emergency Department (HOSPITAL_COMMUNITY)
Admission: EM | Admit: 2017-10-15 | Discharge: 2017-10-15 | Disposition: A | Discharge status: Home / Self Care | Payer: Medicare Other | Attending: Emergency Medicine | Admitting: Emergency Medicine

## 2017-10-15 ENCOUNTER — Ambulatory Visit: Payer: Self-pay

## 2017-10-15 ENCOUNTER — Encounter (HOSPITAL_COMMUNITY): Payer: Self-pay | Admitting: Emergency Medicine

## 2017-10-15 DIAGNOSIS — I509 Heart failure, unspecified: Secondary | ICD-10-CM | POA: Diagnosis not present

## 2017-10-15 DIAGNOSIS — N183 Chronic kidney disease, stage 3 (moderate): Secondary | ICD-10-CM | POA: Diagnosis not present

## 2017-10-15 DIAGNOSIS — Z87891 Personal history of nicotine dependence: Secondary | ICD-10-CM | POA: Insufficient documentation

## 2017-10-15 DIAGNOSIS — B9789 Other viral agents as the cause of diseases classified elsewhere: Secondary | ICD-10-CM

## 2017-10-15 DIAGNOSIS — J069 Acute upper respiratory infection, unspecified: Secondary | ICD-10-CM | POA: Diagnosis not present

## 2017-10-15 DIAGNOSIS — I130 Hypertensive heart and chronic kidney disease with heart failure and stage 1 through stage 4 chronic kidney disease, or unspecified chronic kidney disease: Secondary | ICD-10-CM | POA: Diagnosis not present

## 2017-10-15 DIAGNOSIS — R05 Cough: Secondary | ICD-10-CM | POA: Diagnosis not present

## 2017-10-15 DIAGNOSIS — Z79899 Other long term (current) drug therapy: Secondary | ICD-10-CM | POA: Diagnosis not present

## 2017-10-15 MED ORDER — BENZONATATE 100 MG PO CAPS: 100.0000 mg | ORAL_CAPSULE | Freq: Three times a day (TID) | ORAL | 0 refills | Status: DC

## 2017-10-15 MED ORDER — GI COCKTAIL ~~LOC~~
30.0000 mL | Freq: Once | ORAL | Status: AC
  Administered 2017-10-15: 30 mL via ORAL
  Filled 2017-10-15: qty 30

## 2017-10-15 NOTE — Telephone Encounter (Signed)
Pt calling with a productive cough that has been there since she was seen in the ED back in November. Cough is frothy and white and pt states phlegm comes up on its own and when she coughs it up. Pt has a h/o CHF and a fib. Pt states she has shortness of breath with exertion. Pt has had to be intubated recent past. Pt also c/o sore throat. Pt states she has been out of fluid pill. Per protocol, pt advised to go to the nearest ED and to have someone drive her.   Reason for Disposition . Chest pain  (Exception: MILD central chest pain, present only when coughing)  Answer Assessment - Initial Assessment Questions 1. ONSET: "When did the cough begin?"      Began at hospital but has gotten better and worse 2. SEVERITY: "How bad is the cough today?"      "havent been coughing much." 3. RESPIRATORY DISTRESS: "Describe your breathing."      "it seems to be pretty normal" 4. FEVER: "Do you have a fever?" If so, ask: "What is your temperature, how was it measured, and when did it start?"     No fever 5. SPUTUM: "Describe the color of your sputum" (clear, white, yellow, green)     White frothy and foaming-  Per pt it comes up on its own and will also come up with coughing. 6. HEMOPTYSIS: "Are you coughing up any blood?" If so ask: "How much?" (flecks, streaks, tablespoons, etc.)     No blood  7. CARDIAC HISTORY: "Do you have any history of heart disease?" (e.g., heart attack, congestive heart failure)      Atrial fib and CHF 8. LUNG HISTORY: "Do you have any history of lung disease?"  (e.g., pulmonary embolus, asthma, emphysema)     No  9. PE RISK FACTORS: "Do you have a history of blood clots?" (or: recent major surgery, recent prolonged travel, bedridden )     no 10. OTHER SYMPTOMS: "Do you have any other symptoms?" (e.g., runny nose, wheezing, chest pain)       Runny nose, chest feels sore middle of her chest, shortness of breath with exertion 11. PREGNANCY: "Is there any chance you are pregnant?"  "When was your last menstrual period?"       n/a 12. TRAVEL: "Have you traveled out of the country in the last month?" (e.g., travel history, exposures)       no  Protocols used: COUGH - ACUTE PRODUCTIVE-A-AH

## 2017-10-15 NOTE — Discharge Instructions (Signed)
Take one capsule of benzonatate every 8 hours as needed for cough.  Drinking beverages like hot tea will also help to soothe your sore throat. You can also use a cool mist humidifier to help with your symptoms.  If your cough does not start to improve in the next 1-2 weeks, please follow-up with your primary care provider.  If you have any worsening symptoms, including a cough with brown or bright red mucous, fever, shortness of breath, chest pain, or other new concerning symptoms, please return to the emergency department for re-evaluation.

## 2017-10-15 NOTE — Telephone Encounter (Signed)
I had 2 openings in the afternoon. Patient was sent home and advised PCP follow up. Let's at least offer folks appointments unless we truly believe it is emergent. I may need to start getting involved before [facility] are [facility] unless after hours.

## 2017-10-15 NOTE — ED Provider Notes (Signed)
[facility]-EMERGENCY DEPT Provider Note   CSN: [id number] Arrival date & time: 10/15/17  1434     History   Chief Complaint Chief Complaint  Patient presents with  . Cough    HPI Victoria Gomez is a 83 y.o. female with a h/o of GERD, Barrett's esophagus, CHF  who comes to the emergency department with a chief complaint of cough.  She reports an intermittently productive cough with clear sputum that is worse first thing in the morning and at night with associated sore throat, and rhinorrhea.  She reports she came for evaluation today because when she first awoke this morning she felt like she almost choked for about 1-2 minutes on sputum in her throat when she first awoke this morning. She denies a h/o of difficulty swallowing solids or liquids. She denies fever, chills, dyspnea, chest pain, back pain, palpitations, neck pain or stiffness, lower extremity swelling, recent weight gain, abdominal pain, or N/V/D.  She reports that she has treated her symptoms with Robitussin, which will resolve the cough for a short time.   No h/o of asthma or COPD. She is a former smoker; quit in 1971.   The history is provided by the patient. No language interpreter was used.  Cough  This is a new problem. The current episode started more than 1 week ago. The problem occurs every few minutes. The problem has not changed since onset.The cough is productive of sputum. There has been no fever. Associated symptoms include rhinorrhea and sore throat. Pertinent negatives include no chest pain, no chills, no sweats, no weight loss, no ear congestion, no ear pain, no headaches, no myalgias, no shortness of breath, no wheezing and no eye redness. Treatments tried: Robitussin. The treatment provided mild relief. She is not a smoker. Her past medical history does not include bronchitis, pneumonia, bronchiectasis, COPD, emphysema or asthma.    Past Medical History:  Diagnosis Date  . Arthritis    . Atrial fibrillation (HCC)    a. Dx 09/2013.  [name]. BARRETT'S ESOPHAGUS, HX OF   . Bladder disorder   . Bradycardia 08/2012   a. Eval for symptomatic bradycardia 08/2012 - beta blocker discontinued.  [name]. BREAST MASS, BENIGN   . CAD (coronary artery disease)    a. s/p PCI to RCA '01. b. repeat PCI to RCA '03 2/2 ISR. c. cath 2012: RCA dz, rx medically.  Occluded RCA.  Nonobstructive disease elsewhere  . Chronic combined systolic and diastolic CHF (congestive heart failure) (HCC)    a. EF 40-45% in 2014. b. EF reported to be normal in 03/2015 nuc.  . CKD (chronic kidney disease), stage III (HCC)   . DIVERTICULOSIS, COLON   . Epistaxis   . Esophageal reflux   . History of breast lump/mass excision 05/23/2009   Qualifier: History of  By: Lovell SheehanJenkins MD, Balinda QuailsJohn E   . HYPERLIPIDEMIA 03/07/2008  . HYPERTENSION 08/03/2007  . HYPOTHYROIDISM   . Internal hemorrhoids   . Interstitial cystitis   . LBBB (left bundle branch block)   . Orthostatic hypotension 08/2012   a. 08/2012 w/ syncope - meds adjusted.   . Right hand fracture     Patient Active Problem List   Diagnosis Date Noted  . Acute respiratory failure with hypoxemia (HCC) 09/13/2017  . Acute on chronic combined systolic and diastolic CHF (congestive heart failure) (HCC) 09/13/2017    Class: Acute  . Hypertensive heart and kidney disease with acute on chronic combined systolic and diastolic congestive  heart failure and stage 3 chronic kidney disease (HCC) 09/13/2017    Class: Acute  . Chronic anticoagulation 07/09/2017  . Left groin hernia 06/25/2017  . Osteopenia 06/28/2015  . Vitamin B12 deficiency 07/28/2014  . CKD (chronic kidney disease), stage III (HCC) 09/22/2013  . LBBB (left bundle branch block) 09/22/2013  . Diabetes mellitus II 09/22/2013  . Internal hemorrhoids   . Permanent atrial fibrillation (HCC) 09/21/2013  . H/O sinus bradycardia 08/10/2012  . Orthostatic hypotension 08/10/2012  . Fatigue 04/20/2012  . Combined  systolic and diastolic congestive heart failure (HCC) 11/17/2011  . ARTHRITIS 12/30/2010  . Left lumbar radiculopathy 12/11/2010  . Interstitial cystitis   . Hyperlipidemia 03/07/2008  . Esophageal reflux 10/18/2007  . Hypothyroidism 08/03/2007  . Essential hypertension 08/03/2007  . CAD S/P percutaneous coronary angioplasty 08/03/2007  . BARRETT'S ESOPHAGUS, HX OF 08/03/2007    Past Surgical History:  Procedure Laterality Date  . ABDOMINAL HYSTERECTOMY    . ANKLE RECONSTRUCTION     Left  . APPENDECTOMY    . BREAST SURGERY     benign mass  . CARDIOVERSION N/A 10/20/2013  . CARDIOVERSION N/A 04/24/2015   Procedure: CARDIOVERSION;  Surgeon: Lars MassonKatarina H Nelson, MD;  [facility];  Service: Cardiovascular;  Laterality: N/A;  . CATARACT EXTRACTION, BILATERAL    . CESAREAN SECTION    . CHOLECYSTECTOMY    . LEFT HEART CATH AND CORONARY ANGIOGRAPHY N/A 09/16/2017   Procedure: LEFT HEART CATH AND CORONARY ANGIOGRAPHY;  Surgeon: SwazilandJordan, Peter M, MD;  [facility];  Service: Cardiovascular;  Laterality: N/A;  . TONSILLECTOMY    . WRIST RECONSTRUCTION     Right    OB History    No data available       Home Medications    Prior to Admission medications   Medication Sig Start Date End Date Taking? Authorizing Provider  acetaminophen (TYLENOL) 500 MG tablet Take 500 mg by mouth every 6 (six) hours as needed for mild pain. Reported on 12/12/2015   Yes [provider]  apixaban (ELIQUIS) 2.5 MG TABS tablet Take 1 tablet (2.5 mg total) 2 (two) times daily by mouth. 09/24/17  Yes Hochrein, Fayrene FearingJames, MD  diclofenac sodium (VOLTAREN) 1 % GEL Apply 2 g topically 4 (four) times daily. 06/25/17  Yes Shelva MajesticHunter, Stephen O, MD  furosemide (LASIX) 20 MG tablet Take 3 tablets (60 mg total) daily by mouth. 09/17/17  Yes Drema DallasWoods, Curtis J, MD  isosorbide mononitrate (IMDUR) 30 MG 24 hr tablet Take 3 tablets (90 mg total) daily by mouth. 09/18/17  Yes Drema DallasWoods, Curtis J, MD    lansoprazole (PREVACID) 30 MG capsule TAKE 1 CAPSULE TWICE DAILY 08/20/17  Yes Lars MassonNelson, Katarina H, MD  levothyroxine (SYNTHROID) 50 MCG tablet Take 1 tablet (50 mcg total) by mouth daily before breakfast. 12/25/16  Yes Shelva MajesticHunter, Stephen O, MD  losartan (COZAAR) 100 MG tablet TAKE 1 TABLET BY MOUTH EVERY DAY 09/29/17  Yes Rollene RotundaHochrein, James, MD  metoprolol tartrate (LOPRESSOR) 25 MG tablet Take 1 tablet (25 mg total) 2 (two) times daily by mouth. 09/17/17  Yes Drema DallasWoods, Curtis J, MD  pentosan polysulfate (ELMIRON) 100 MG capsule Take 100 mg by mouth 2 (two) times daily.    Yes [provider]  potassium chloride (K-DUR,KLOR-CON) 10 MEQ tablet Take 1 tablet (10 mEq total) daily by mouth. 09/18/17  Yes Drema DallasWoods, Curtis J, MD  pravastatin (PRAVACHOL) 40 MG tablet TAKE 1 TABLET DAILY 11/12/16  Yes Shelva MajesticHunter, Stephen O, MD  benzonatate (  TESSALON) 100 MG capsule Take 1 capsule (100 mg total) by mouth every 8 (eight) hours. 10/15/17   Eshaal Duby A, PA-C  glucose blood (ONETOUCH VERIO) test strip USE TO TEST BLOOD SUGARS DAILY. DX: E11.9 09/16/17   Shelva Majestic, MD  nitroGLYCERIN (NITROSTAT) 0.4 MG SL tablet ONE UNDER THE TONGUE EVERY 5 MINUTES AS NEEDED FOR CHEST PAIN. IF PAIN AFTER 3 PILLS, CALL 911 05/25/17   Shelva Majestic, MD  [facility] DELICA LANCETS 33G MISC Use to test blood sugars daily. Dx: E11.9 07/03/15   Shelva Majestic, MD    Family History Family History  Problem Relation Age of Onset  . Colon cancer Mother   . Coronary artery disease Mother   . Heart disease Mother   . Coronary artery disease Father   . Heart disease Father   . Colon polyps Sister   . Coronary artery disease Sister   . Diabetes type II Son   . Coronary artery disease Brother     Social History Social History   Tobacco Use  . Smoking status: Former Smoker    Packs/day: 1.00    Years: 15.00    Pack years: 15.00    Types: Cigarettes    Last attempt to quit: 11/10/1969    Years since quitting: 47.9  . Smokeless  tobacco: Never Used  Substance Use Topics  . Alcohol use: No    Alcohol/week: 0.0 oz  . Drug use: No   Allergies   Codeine and Sulfonamide derivatives   Review of Systems Review of Systems  Constitutional: Negative for chills, fever and weight loss.  HENT: Positive for rhinorrhea and sore throat. Negative for ear pain, sinus pressure, sinus pain and sneezing.   Eyes: Negative for redness.  Respiratory: Positive for cough. Negative for chest tightness, shortness of breath and wheezing.   Cardiovascular: Negative for chest pain.  Gastrointestinal: Negative for abdominal pain, nausea, rectal pain and vomiting.  Musculoskeletal: Negative for myalgias.  Neurological: Negative for headaches.   Physical Exam Updated Vital Signs BP 128/67 (BP Location: Left Arm)   Pulse 65   Temp 98.4 F (36.9 C) (Oral)   Resp 19   SpO2 100%   Physical Exam  Constitutional: No distress.  Resting comfortably upright on the bed.   HENT:  Head: Normocephalic and atraumatic.  Right Ear: A middle ear effusion is present.  Left Ear: A middle ear effusion is present.  Nose: Right sinus exhibits no maxillary sinus tenderness and no frontal sinus tenderness. Left sinus exhibits no maxillary sinus tenderness and no frontal sinus tenderness.  Mouth/Throat: Uvula is midline, oropharynx is clear and moist and mucous membranes are normal. No trismus in the jaw. No uvula swelling. No tonsillar exudate.  No woody induration of the neck.  Eyes: Conjunctivae are normal.  Neck: Normal range of motion. Neck supple. No JVD present. No tracheal deviation present. No thyromegaly present.  Cardiovascular: Normal rate. Exam reveals no gallop and no friction rub.  No murmur heard. Pulmonary/Chest: Effort normal and breath sounds normal. No stridor. No respiratory distress. She has no wheezes. She has no rales. She exhibits no tenderness.  Abdominal: Soft. Bowel sounds are normal. She exhibits no distension and no mass.  There is no tenderness. There is no rebound and no guarding. No hernia.  Musculoskeletal: She exhibits no edema, tenderness or deformity.  No swelling in the bilateral lower extremities.  Lymphadenopathy:    She has no cervical adenopathy.  Neurological: She is alert.  Skin: Skin is warm. Capillary refill takes less than 2 seconds. No rash noted.  Psychiatric: Her behavior is normal.  Nursing note and vitals reviewed.  ED Treatments / Results  Labs (all labs ordered are listed, but only abnormal results are displayed) Labs Reviewed - No data to display  EKG  EKG Interpretation None       Radiology Dg Chest 2 View  Result Date: 10/15/2017 CLINICAL DATA:  Cough.  History of atrial fibrillation EXAM: CHEST  2 VIEW COMPARISON:  October 02, 2017 FINDINGS: There is no edema or consolidation. Heart is upper normal in size with pulmonary vascularity within normal limits. No adenopathy. There is aortic atherosclerosis. No bone lesions are evident. IMPRESSION: No edema or consolidation.  There is aortic atherosclerosis. Aortic Atherosclerosis (ICD10-I70.0). Electronically Signed   By: Bretta Bang III M.D.   On: 10/15/2017 15:36    Procedures Procedures (including critical care time)  Medications Ordered in ED Medications  gi cocktail (Maalox,Lidocaine,Donnatal) (30 mLs Oral Given 10/15/17 1723)     Initial Impression / Assessment and Plan / ED Course  I have reviewed the triage vital signs and the nursing notes.  Pertinent labs & imaging results that were available during my care of the patient were reviewed by me and considered in my medical decision making (see chart for details).     83 year old female with a history of GERD, Barrett's esophagus, and CHF who presents to the emergency department with cough, sore throat, and rhinorrhea x2 weeks. CXR is unremarkable for acute cardiopulmonary processes including pneumonia or edema.  On physical exam, no lower extremity edema  noted. Reviewed daily recorded weights in Epic.  She is hemodynamically stable.  Discussed and evaluated the patient with Dr. Adela Lank, attending physician.  Given associated rhinorrhea and sore throat, likely viral process vs secondary to h/o of GERD and Barrett's esophagus.  Doubt influenza or CHF exacerbation.  No acute distress.  Patient is hemodynamically stable.  Return precautions given.  The patient is safe for discharge.   Final Clinical Impressions(s) / ED Diagnoses   Final diagnoses:  Viral URI with cough    ED Discharge Orders        Ordered    benzonatate (TESSALON) 100 MG capsule  Every 8 hours     10/15/17 1737       Carmelina Balducci A, PA-C 10/15/17 1744    Melene Plan, DO 10/15/17 1940

## 2017-10-15 NOTE — ED Triage Notes (Signed)
Patient c/o productive cough with sore throat x2 weeks. Speaking in full sentence without difficulty. Denies N/V/D.

## 2017-10-16 ENCOUNTER — Other Ambulatory Visit: Payer: Self-pay | Admitting: *Deleted

## 2017-10-16 NOTE — Patient Outreach (Signed)
[facility]) Care Management  10/16/2017  Victoria HaltMary E Gomez Dec 13, 1924 [id number]    ED visit on 12/6  RN followed up with pt and verified identifiers before inquiring on her recent ED visit for a viral cough. Pt states she has cough medicine at her local [facility] that her son will obtain prior to th end of today. States her provider also informed pt to get Sudafed if needed to assist with the ongoing drainage. No fevers or issues reported at this time and her HF continues to be in the GREEN zone other then the ongoing viral cough but "the medicine should help". No other issues reported at this time as pt recovering fairly well. Will follow up next week with ongoing transition of care call next week.  Elliot CousinLisa Arnola Crittendon, RN Care Management Coordinator [facility] Main Office [phone]

## 2017-10-17 NOTE — Telephone Encounter (Signed)
Pt went to ED

## 2017-10-20 ENCOUNTER — Other Ambulatory Visit: Payer: Self-pay | Admitting: *Deleted

## 2017-10-20 ENCOUNTER — Encounter: Payer: Self-pay | Admitting: *Deleted

## 2017-10-20 NOTE — Patient Outreach (Signed)
[facility] Care Management  10/20/2017  Loura HaltMary E Olsen 08-21-1925 [id number]    Transition of care and EMMI RED 12/9  RN spoke with pt today and confirmed identifiers. RN inquired on the reported EMMI red concerning lost of interest as pt reported she does not remember but she is doing well and declined all resources related to counseling RN completed the initial assessment and completed the depression screening. RN discussed social work involvement and if necessary mobile crises if acute thoughts are present. Pt opt to declined indicating no suicidal thoughts. RN inquired on her ongoing management of care reporting her weights around 158 lbs with no weight gained of 3 lbs overnight or 5 lbs within one week. Pt states no symptoms of swelling and once HF zones review pt reports she is in the GREEN zone. Plan of care reviewed and goals adjusted accordingly to pt's participation. RN will continue to encoiuraged adherence and stress the importance of daily monitoring.  Pt also updated RN on her ongoing cough and states she continue to take the Tessalon pills and Sudafed as recommended by her provider but now her symptoms remain the same with an ongoing cough and now her throat is sore with some discomfort along wiith yellow sputum denying any fevers. RN strongly encouraged pt to contact her provider with these symptoms and specific the duration of her new symptoms.   RN again extended the offer for a home visit this RN for more direct care however pt opt to continue to telephonic transition of care calls and RN scheduled the next telephone call for next week. RN left contact name and number if needed however received permission to contact her if there is ongoing RED EMMI reports accordingly (pt receptive).  Elliot CousinLisa Francelia Mclaren, RN Care Management Coordinator [facility] Main Office [phone]

## 2017-10-21 ENCOUNTER — Telehealth: Payer: Self-pay

## 2017-10-21 ENCOUNTER — Other Ambulatory Visit: Payer: Self-pay | Admitting: *Deleted

## 2017-10-21 ENCOUNTER — Ambulatory Visit: Payer: Self-pay | Admitting: *Deleted

## 2017-10-21 NOTE — Patient Outreach (Signed)
[facility] Care Management  10/21/2017  Loura HaltMary E Shukla 1925-04-28 [id number]    Pt contacted RN today with an update that she has contacted the provider's office concerning her ongoing symptoms related to chronic coughing and sore throat now with discoloration to her sputum (yellow/white) pt continues to deny any fevers, chills however states her eyes were swollen this morning upon arising. Pt states she contacted the provider's office on yesterday however representative indicated the office was closed but provider would be available today. Pt only able to to leave a detail description of symptoms with the representative that would transcribed her message to the provider today. Pt states she is currently awaiting on a call back. Pt feels she needs an antibiotics for her symptoms to resolve at this time. RN strongly encourage pt to report all her symptoms to the provider on the pending call back and follow up with the office again today if no response. Pt denies any severe pain or discomfort however reports an additional symptom with her eye drainage and swelling this morning that she has been applying warm compresses to minimize ongoing swelling. No other issues as pt slightly anxious anticipating the call back. Pt is aware if symptoms get severe to seek medical attention however follow up with the provider's office prior to if she is able to contact the triage nurse (pt with a clear understanding). No other request or inquires at this time as pt again just wanted to update RN case manager on her efforts to seek interventions from her provider. RN will follow up accordingly with ongoing transition of care. No other issues to address at this time.  Elliot CousinLisa Matthews, RN Care Management Coordinator [facility] Main Office [phone]

## 2017-10-21 NOTE — Telephone Encounter (Signed)
Copied from CRM [phone]. Topic: Quick Communication - See Telephone Encounter >> Oct 20, 2017  2:15 PM Arlyss Gandyichardson, Taren N, NT wrote: CRM for notification. See Telephone encounter for: Patient requesting a antibiotic for the cold she has. She went to the ER on 10/15/17.   10/20/17.

## 2017-10-21 NOTE — Telephone Encounter (Signed)
Could she come in at 4 15 tomorrow to evaluate? ER physician wanted her to see us back in the office

## 2017-10-23 ENCOUNTER — Ambulatory Visit (INDEPENDENT_AMBULATORY_CARE_PROVIDER_SITE_OTHER): Payer: Medicare Other | Admitting: Family Medicine

## 2017-10-23 ENCOUNTER — Encounter: Payer: Self-pay | Admitting: Family Medicine

## 2017-10-23 VITALS — BP 112/72 | HR 89 | Temp 98.4°F | Ht 63.0 in | Wt 160.2 lb

## 2017-10-23 DIAGNOSIS — K22719 Barrett's esophagus with dysplasia, unspecified: Secondary | ICD-10-CM

## 2017-10-23 DIAGNOSIS — R05 Cough: Secondary | ICD-10-CM | POA: Diagnosis not present

## 2017-10-23 DIAGNOSIS — R059 Cough, unspecified: Secondary | ICD-10-CM

## 2017-10-23 MED ORDER — IPRATROPIUM BROMIDE 0.06 % NA SOLN: 2.0000 | Freq: Four times a day (QID) | NASAL | 0 refills | Status: DC

## 2017-10-23 MED ORDER — BENZONATATE 200 MG PO CAPS: 200.0000 mg | ORAL_CAPSULE | Freq: Two times a day (BID) | ORAL | 0 refills | Status: DC | PRN

## 2017-10-23 MED ORDER — AZITHROMYCIN 250 MG PO TABS: ORAL_TABLET | ORAL | 0 refills | Status: DC

## 2017-10-23 NOTE — Progress Notes (Signed)
    Subjective:  Victoria Gomez is a 83 y.o. female who presents today with a chief complaint of cough.   HPI:  Cough, acute issue Symptoms started 3-4 weeks ago.  Have been stable if not a little worse over that time.  She was seen in the emergency room about a week ago and diagnosed with a viral URI.  Associated symptoms include throat irritation and rhinorrhea.  Since then symptoms have not improved.  She is given a prescription for Tessalon for cough which seems to help a little bit.  No obvious alleviating or aggravating factors.  Barrett's esophagus, established problem Patient would like referral to GI for further evaluation.  No dysphasia.  No regurgitation.    ROS: Per HPI  PMH: Smoking history reviewed.  Former smoker.  Objective:  Physical Exam: BP 112/72 (BP Location: Left Arm, Patient Position: Sitting, Cuff Size: Normal)   Pulse 89   Temp 98.4 F (36.9 C)   Ht 5\' 3"  (1.6 m)   Wt 160 lb 3.2 oz (72.7 kg)   SpO2 98%   BMI 28.38 kg/m   Gen: NAD, resting comfortably HEENT: TMs are clear effusions bilaterally.  Oropharynx clear without exudate. CV: RRR with no murmurs appreciated Pulm: NWOB, CTAB with no crackles, wheezes, or rhonchi  Assessment/Plan:  Cough Given that symptoms have been persistent for several weeks, it is reasonable to start antibiotic today.  We will send in prescription for azithromycin.  She is also having some upper respiratory symptoms-we will start Atrovent nasal spray.  Given additional supply of her Tessalon.  Encouraged good oral hydration.  Return precautions reviewed.  Follow-up as needed.  Barrett's esophagus No red flag signs or symptoms.  Referral to GI placed today.  Katina Degreealeb M. Jimmey RalphParker, MD 10/23/2017 5:23 PM

## 2017-10-23 NOTE — Telephone Encounter (Signed)
Patient is coming in today to see Dr. Jimmey RalphParker.

## 2017-10-26 ENCOUNTER — Other Ambulatory Visit: Payer: Self-pay

## 2017-10-26 MED ORDER — METOPROLOL TARTRATE 25 MG PO TABS: 9.0000 mg | ORAL_TABLET | Freq: Two times a day (BID) | ORAL | 9 refills | Status: DC

## 2017-10-27 ENCOUNTER — Telehealth: Payer: Self-pay | Admitting: Cardiology

## 2017-10-27 ENCOUNTER — Other Ambulatory Visit: Payer: Self-pay | Admitting: *Deleted

## 2017-10-27 ENCOUNTER — Telehealth: Payer: Self-pay | Admitting: Family Medicine

## 2017-10-27 MED ORDER — METOPROLOL TARTRATE 25 MG PO TABS: 25.0000 mg | ORAL_TABLET | Freq: Two times a day (BID) | ORAL | 2 refills | Status: DC

## 2017-10-27 NOTE — Telephone Encounter (Signed)
New Message Pt c/o medication issue:  1. Name of Medication: metoprolol tartrate (LOPRESSOR) 25 MG tablet  2. How are you currently taking this medication (dosage and times per day)?   3. Are you having a reaction (difficulty breathing--STAT)? no  4. What is your medication issue? Patient is needing some clarification on how she should be taken this medication. Example once a day or twice day etc.

## 2017-10-27 NOTE — Patient Outreach (Signed)
[name]) Care Management  10/27/2017  Victoria Gomez 1925/08/28 [id number]   Transition of care/Medication issues  RN spoke wit hpt today and verified identifiers. Pt states she visited her primary provider on Friday and was prescribed an antibiotic, more Tessalon cough medicine and nasal spray. Pt also reports issues with tow of her medication (Metoprolol and Lasix.) states she has not taken her Lasix since Friday (4 days) and her Metoprolol was changed to 25 mg 2 X daily. Neither prescription has been called to pharmacy. RN offered to assist and contacted Dr. Percival Spanish (CAD) for the Metoprolol and Dr. Yong Channel (primary) for the Lasix. Both representative will report to the triage nurse and complete this task. RN provider pt's contact and the pharmacy for refill. Pt was updated and will contact the pharmacy later today on these medications.   HF:  RN inquired on her ongoing management of care related to her HF as pt reports her weights (last week 159 lbs, yesterday 159 lbs and today 160 lbs) as pt denied any swelling or fluid retention with no symptoms related to her HF however ongoing cough due to sinusitis and drainage (treatment provided). RN reiterated on the GREEN zone and verified pt remains in the GREEN with no reported issues related to her HF. Pt is aware of what to do if acute symptoms should occur. Plan of care discussed in detail on those goals she has met and the existing goals adjusted accordingly to pt's progress. Pt very grateful for the assistance today and aware of a follow up call next week with the ongoing transition of care call.  No other request or inquires at this time. Will schedule accordingly.  Raina Mina, RN Care Management Coordinator [place] Office [phone]

## 2017-10-27 NOTE — Telephone Encounter (Signed)
Yes thanks, may refill Jamie. Last one printed- you can send this electronically

## 2017-10-27 NOTE — Telephone Encounter (Signed)
Please advise on the note below regarding a medication refill.

## 2017-10-27 NOTE — Telephone Encounter (Signed)
Medication request [facility]

## 2017-10-27 NOTE — Telephone Encounter (Signed)
Per note LK 09-28-17: now on metoprolol 12.5 mg BID--> now to 25mg  BID. Returned call to pt she states that she did not call but, was in the hospital recently.  She states that her BP has been running low since, she states that she does not know the numbers. Looks like this has happened before, should she continue with this dosing? She is almost out of medication.She would like us to call the nurse Lutricia FeilLisa Donnell [phone].  [facility] that Elliot CousinLisa Matthews called. Transferred to ShivelyLisa. She states that that pt needs refill of Metoprolol 25mg  BID. Refill sent as requested. She will call pt

## 2017-10-28 ENCOUNTER — Other Ambulatory Visit: Payer: Self-pay

## 2017-10-28 ENCOUNTER — Emergency Department (HOSPITAL_COMMUNITY)
Admission: EM | Admit: 2017-10-28 | Discharge: 2017-10-28 | Disposition: A | Discharge status: Home / Self Care | Payer: Medicare Other | Attending: Emergency Medicine | Admitting: Emergency Medicine

## 2017-10-28 ENCOUNTER — Encounter (HOSPITAL_COMMUNITY): Payer: Self-pay | Admitting: *Deleted

## 2017-10-28 ENCOUNTER — Emergency Department (HOSPITAL_COMMUNITY): Payer: Medicare Other

## 2017-10-28 DIAGNOSIS — N3010 Interstitial cystitis (chronic) without hematuria: Secondary | ICD-10-CM | POA: Diagnosis not present

## 2017-10-28 DIAGNOSIS — Y9389 Activity, other specified: Secondary | ICD-10-CM | POA: Diagnosis not present

## 2017-10-28 DIAGNOSIS — Y999 Unspecified external cause status: Secondary | ICD-10-CM | POA: Diagnosis not present

## 2017-10-28 DIAGNOSIS — S0990XA Unspecified injury of head, initial encounter: Secondary | ICD-10-CM | POA: Diagnosis not present

## 2017-10-28 DIAGNOSIS — I130 Hypertensive heart and chronic kidney disease with heart failure and stage 1 through stage 4 chronic kidney disease, or unspecified chronic kidney disease: Secondary | ICD-10-CM | POA: Diagnosis not present

## 2017-10-28 DIAGNOSIS — Z79899 Other long term (current) drug therapy: Secondary | ICD-10-CM | POA: Insufficient documentation

## 2017-10-28 DIAGNOSIS — W19XXXA Unspecified fall, initial encounter: Secondary | ICD-10-CM

## 2017-10-28 DIAGNOSIS — Y929 Unspecified place or not applicable: Secondary | ICD-10-CM | POA: Insufficient documentation

## 2017-10-28 DIAGNOSIS — W100XXA Fall (on)(from) escalator, initial encounter: Secondary | ICD-10-CM | POA: Diagnosis not present

## 2017-10-28 DIAGNOSIS — Z87891 Personal history of nicotine dependence: Secondary | ICD-10-CM | POA: Insufficient documentation

## 2017-10-28 DIAGNOSIS — I5043 Acute on chronic combined systolic (congestive) and diastolic (congestive) heart failure: Secondary | ICD-10-CM | POA: Diagnosis not present

## 2017-10-28 DIAGNOSIS — E1122 Type 2 diabetes mellitus with diabetic chronic kidney disease: Secondary | ICD-10-CM | POA: Insufficient documentation

## 2017-10-28 DIAGNOSIS — N183 Chronic kidney disease, stage 3 (moderate): Secondary | ICD-10-CM | POA: Diagnosis not present

## 2017-10-28 DIAGNOSIS — I2510 Atherosclerotic heart disease of native coronary artery without angina pectoris: Secondary | ICD-10-CM | POA: Diagnosis not present

## 2017-10-28 DIAGNOSIS — R6889 Other general symptoms and signs: Secondary | ICD-10-CM | POA: Diagnosis not present

## 2017-10-28 DIAGNOSIS — S199XXA Unspecified injury of neck, initial encounter: Secondary | ICD-10-CM | POA: Diagnosis not present

## 2017-10-28 MED ORDER — FUROSEMIDE 20 MG PO TABS: 60.0000 mg | ORAL_TABLET | Freq: Every day | ORAL | 0 refills | Status: DC

## 2017-10-28 NOTE — Telephone Encounter (Signed)
Prescription refill sent to pharmacy as requested 

## 2017-10-28 NOTE — ED Provider Notes (Signed)
  Face-to-face evaluation   History: Fall, mechanical, injury to head and left arm. Able to walk.  Physical exam: Alert, calm and cooperative. Lucid. Good ROM arms.  Medical screening examination/treatment/procedure(s) were conducted as a shared visit with non-physician practitioner(s) and myself.  I personally evaluated the patient during the encounter   Mancel BaleWentz, Oniel Meleski, MD 10/29/17 [phone]

## 2017-10-28 NOTE — ED Provider Notes (Signed)
[facility] EMERGENCY DEPARTMENT Provider Note   CSN: [id number] Arrival date & time: 10/28/17  1341     History   Chief Complaint Chief Complaint  Patient presents with  . Fall    HPI Victoria Gomez is a 83 y.o. female.  HPI   83 year old female tripped and fell going up the escalator.  She reports striking her left elbow, right knee and the right side of her face.  No loss of consciousness, no neurological deficits, ambulatory on scene without significant pain or difficult.  Patient denies any other injuries, this was a mechanical fall.  She reports she has A. fib and takes Eliquis.  Denies any other complaints.  Past Medical History:  Diagnosis Date  . Arthritis   . Atrial fibrillation (HCC)    a. Dx 09/2013.  [name] BARRETT'S ESOPHAGUS, HX OF   . Bladder disorder   . Bradycardia 08/2012   a. Eval for symptomatic bradycardia 08/2012 - beta blocker discontinued.  [name] BREAST MASS, BENIGN   . CAD (coronary artery disease)    a. s/p PCI to RCA '01. b. repeat PCI to RCA '03 2/2 ISR. c. cath 2012: RCA dz, rx medically.  Occluded RCA.  Nonobstructive disease elsewhere  . Chronic combined systolic and diastolic CHF (congestive heart failure) (HCC)    a. EF 40-45% in 2014. b. EF reported to be normal in 03/2015 nuc.  . CKD (chronic kidney disease), stage III (HCC)   . DIVERTICULOSIS, COLON   . Epistaxis   . Esophageal reflux   . History of breast lump/mass excision 05/23/2009   Qualifier: History of  By: Lovell Sheehan MD, Balinda Quails   . HYPERLIPIDEMIA 03/07/2008  . HYPERTENSION 08/03/2007  . HYPOTHYROIDISM   . Internal hemorrhoids   . Interstitial cystitis   . LBBB (left bundle branch block)   . Orthostatic hypotension 08/2012   a. 08/2012 w/ syncope - meds adjusted.   . Right hand fracture     Patient Active Problem List   Diagnosis Date Noted  . Acute respiratory failure with hypoxemia (HCC) 09/13/2017  . Acute on chronic combined systolic and diastolic CHF  (congestive heart failure) (HCC) 09/13/2017    Class: Acute  . Hypertensive heart and kidney disease with acute on chronic combined systolic and diastolic congestive heart failure and stage 3 chronic kidney disease (HCC) 09/13/2017    Class: Acute  . Chronic anticoagulation 07/09/2017  . Left groin hernia 06/25/2017  . Osteopenia 06/28/2015  . Vitamin B12 deficiency 07/28/2014  . CKD (chronic kidney disease), stage III (HCC) 09/22/2013  . LBBB (left bundle branch block) 09/22/2013  . Diabetes mellitus II 09/22/2013  . Internal hemorrhoids   . Permanent atrial fibrillation (HCC) 09/21/2013  . H/O sinus bradycardia 08/10/2012  . Orthostatic hypotension 08/10/2012  . Fatigue 04/20/2012  . Combined systolic and diastolic congestive heart failure (HCC) 11/17/2011  . ARTHRITIS 12/30/2010  . Left lumbar radiculopathy 12/11/2010  . Interstitial cystitis   . Hyperlipidemia 03/07/2008  . Esophageal reflux 10/18/2007  . Hypothyroidism 08/03/2007  . Essential hypertension 08/03/2007  . CAD S/P percutaneous coronary angioplasty 08/03/2007  . BARRETT'S ESOPHAGUS, HX OF 08/03/2007    Past Surgical History:  Procedure Laterality Date  . ABDOMINAL HYSTERECTOMY    . ANKLE RECONSTRUCTION     Left  . APPENDECTOMY    . BREAST SURGERY     benign mass  . CARDIOVERSION N/A 10/20/2013  . CARDIOVERSION N/A 04/24/2015   Procedure: CARDIOVERSION;  Surgeon: Lars Masson,  MD;  Location: [facility] ENDOSCOPY;  Service: Cardiovascular;  Laterality: N/A;  . CATARACT EXTRACTION, BILATERAL    . CESAREAN SECTION    . CHOLECYSTECTOMY    . LEFT HEART CATH AND CORONARY ANGIOGRAPHY N/A 09/16/2017   Procedure: LEFT HEART CATH AND CORONARY ANGIOGRAPHY;  Surgeon: SwazilandJordan, Peter M, MD;  [facility];  Service: Cardiovascular;  Laterality: N/A;  . TONSILLECTOMY    . WRIST RECONSTRUCTION     Right    OB History    No data available       Home Medications    Prior to Admission medications     Medication Sig Start Date End Date Taking? Authorizing Provider  acetaminophen (TYLENOL) 500 MG tablet Take 500 mg by mouth every 6 (six) hours as needed for mild pain. Reported on 12/12/2015    [provider]  apixaban (ELIQUIS) 2.5 MG TABS tablet Take 1 tablet (2.5 mg total) 2 (two) times daily by mouth. 09/24/17   Rollene RotundaHochrein, James, MD  azithromycin (ZITHROMAX) 250 MG tablet Take 2 tabs day 1, then 1 tab daily 10/23/17   Ardith DarkParker, Caleb M, MD  benzonatate (TESSALON) 100 MG capsule Take 1 capsule (100 mg total) by mouth every 8 (eight) hours. Patient not taking: Reported on 10/27/2017 10/15/17   McDonald, Pedro EarlsMia A, PA-C  benzonatate (TESSALON) 200 MG capsule Take 1 capsule (200 mg total) by mouth 2 (two) times daily as needed for cough. 10/23/17   Ardith DarkParker, Caleb M, MD  diclofenac sodium (VOLTAREN) 1 % GEL Apply 2 g topically 4 (four) times daily. 06/25/17   Shelva MajesticHunter, Stephen O, MD  furosemide (LASIX) 20 MG tablet Take 3 tablets (60 mg total) by mouth daily. 10/28/17   Shelva MajesticHunter, Stephen O, MD  glucose blood (ONETOUCH VERIO) test strip USE TO TEST BLOOD SUGARS DAILY. DX: E11.9 09/16/17   Shelva MajesticHunter, Stephen O, MD  ipratropium (ATROVENT) 0.06 % nasal spray Place 2 sprays into both nostrils 4 (four) times daily. 10/23/17   Ardith DarkParker, Caleb M, MD  isosorbide mononitrate (IMDUR) 30 MG 24 hr tablet Take 3 tablets (90 mg total) daily by mouth. 09/18/17   Drema DallasWoods, Curtis J, MD  lansoprazole (PREVACID) 30 MG capsule TAKE 1 CAPSULE TWICE DAILY 08/20/17   Lars MassonNelson, Katarina H, MD  levothyroxine (SYNTHROID) 50 MCG tablet Take 1 tablet (50 mcg total) by mouth daily before breakfast. 12/25/16   Shelva MajesticHunter, Stephen O, MD  losartan (COZAAR) 100 MG tablet TAKE 1 TABLET BY MOUTH EVERY DAY 09/29/17   Rollene RotundaHochrein, James, MD  metoprolol tartrate (LOPRESSOR) 25 MG tablet Take 1 tablet (25 mg total) by mouth 2 (two) times daily. 10/27/17   Rollene RotundaHochrein, James, MD  nitroGLYCERIN (NITROSTAT) 0.4 MG SL tablet ONE UNDER THE TONGUE EVERY 5 MINUTES AS  NEEDED FOR CHEST PAIN. IF PAIN AFTER 3 PILLS, CALL 911 05/25/17   Shelva MajesticHunter, Stephen O, MD  [facility] DEL[facility] LANCETS 33G MISC Use to test blood sugars daily. Dx: E11.9 07/03/15   Shelva MajesticHunter, Stephen O, MD  pentosan polysulfate (ELMIRON) 100 MG capsule Take 100 mg by mouth 2 (two) times daily.     [provider]  potassium chloride (K-DUR,KLOR-CON) 10 MEQ tablet Take 1 tablet (10 mEq total) daily by mouth. 09/18/17   Drema DallasWoods, Curtis J, MD  pravastatin (PRAVACHOL) 40 MG tablet TAKE 1 TABLET DAILY 11/12/16   Shelva MajesticHunter, Stephen O, MD    Family History Family History  Problem Relation Age of Onset  . Colon cancer Mother   . Coronary artery disease Mother   .  Heart disease Mother   . Coronary artery disease Father   . Heart disease Father   . Colon polyps Sister   . Coronary artery disease Sister   . Diabetes type II Son   . Coronary artery disease Brother     Social History Social History   Tobacco Use  . Smoking status: Former Smoker    Packs/day: 1.00    Years: 15.00    Pack years: 15.00    Types: Cigarettes    Last attempt to quit: 11/10/1969    Years since quitting: 47.9  . Smokeless tobacco: Never Used  Substance Use Topics  . Alcohol use: No    Alcohol/week: 0.0 oz  . Drug use: No     Allergies   Codeine and Sulfonamide derivatives   Review of Systems Review of Systems  All other systems reviewed and are negative.   Physical Exam Updated Vital Signs BP 140/74 (BP Location: Right Arm)   Pulse 76   Temp 99 F (37.2 C) (Oral)   Resp 16   SpO2 100%   Physical Exam  Constitutional: She is oriented to person, place, and time. She appears well-developed and well-nourished.  HENT:  Head: Normocephalic and atraumatic.  Eyes: Conjunctivae are normal. Pupils are equal, round, and reactive to light. Right eye exhibits no discharge. Left eye exhibits no discharge. No scleral icterus.  Neck: Normal range of motion. No JVD present. No tracheal deviation present.    Pulmonary/Chest: Effort normal. No stridor.  Musculoskeletal:  Minor tenderness to cervical region, no T or L-spine tenderness, hip stable bilateral, no significant tenderness to the right knee, minor bruising noted to the right knee no swelling or edema no pain with range of motion  Eft elbow with superficial abrasion no significant pain with range of motion no bony tenderness  Neurological: She is alert and oriented to person, place, and time. No cranial nerve deficit or sensory deficit. She exhibits normal muscle tone. Coordination normal.  Psychiatric: She has a normal mood and affect. Her behavior is normal. Judgment and thought content normal.  Nursing note and vitals reviewed.    ED Treatments / Results  Labs (all labs ordered are listed, but only abnormal results are displayed) Labs Reviewed - No data to display  EKG  EKG Interpretation None       Radiology Ct Head Wo Contrast  Result Date: 10/28/2017 CLINICAL DATA:  Fall, landing on right side of face EXAM: CT HEAD WITHOUT CONTRAST CT CERVICAL SPINE WITHOUT CONTRAST TECHNIQUE: Multidetector CT imaging of the head and cervical spine was performed following the standard protocol without intravenous contrast. Multiplanar CT image reconstructions of the cervical spine were also generated. COMPARISON:  None. FINDINGS: CT HEAD FINDINGS Brain: There is atrophy and chronic small vessel disease changes. No acute intracranial abnormality. Specifically, no hemorrhage, hydrocephalus, mass lesion, acute infarction, or significant intracranial injury. Vascular: No hyperdense vessel or unexpected calcification. Skull: Lucency within the right frontotemporal calvarium may reflect focal osteoporosis. No calvarial fracture. Sinuses/Orbits: Visualized paranasal sinuses and mastoids clear. Orbital soft tissues unremarkable. Other: None CT CERVICAL SPINE FINDINGS Alignment: Slight anterolisthesis of C4 on C5 and retrolisthesis of C5 on C6. Skull  base and vertebrae: No fracture. Soft tissues and spinal canal: Prevertebral soft tissues are normal. No epidural or paraspinal hematoma. Disc levels: Degenerative disc disease most pronounced at C5-6. Mild bilateral degenerative facet disease. Upper chest: No acute findings. Other: No acute findings. IMPRESSION: Atrophy, chronic small vessel disease. No acute intracranial abnormality. Mild  cervical spondylosis.  No acute bony abnormality. Electronically Signed   By: Charlett Nose M.D.   On: 10/28/2017 15:08   Ct Cervical Spine Wo Contrast  Result Date: 10/28/2017 CLINICAL DATA:  Fall, landing on right side of face EXAM: CT HEAD WITHOUT CONTRAST CT CERVICAL SPINE WITHOUT CONTRAST TECHNIQUE: Multidetector CT imaging of the head and cervical spine was performed following the standard protocol without intravenous contrast. Multiplanar CT image reconstructions of the cervical spine were also generated. COMPARISON:  None. FINDINGS: CT HEAD FINDINGS Brain: There is atrophy and chronic small vessel disease changes. No acute intracranial abnormality. Specifically, no hemorrhage, hydrocephalus, mass lesion, acute infarction, or significant intracranial injury. Vascular: No hyperdense vessel or unexpected calcification. Skull: Lucency within the right frontotemporal calvarium may reflect focal osteoporosis. No calvarial fracture. Sinuses/Orbits: Visualized paranasal sinuses and mastoids clear. Orbital soft tissues unremarkable. Other: None CT CERVICAL SPINE FINDINGS Alignment: Slight anterolisthesis of C4 on C5 and retrolisthesis of C5 on C6. Skull base and vertebrae: No fracture. Soft tissues and spinal canal: Prevertebral soft tissues are normal. No epidural or paraspinal hematoma. Disc levels: Degenerative disc disease most pronounced at C5-6. Mild bilateral degenerative facet disease. Upper chest: No acute findings. Other: No acute findings. IMPRESSION: Atrophy, chronic small vessel disease. No acute intracranial  abnormality. Mild cervical spondylosis.  No acute bony abnormality. Electronically Signed   By: Charlett Nose M.D.   On: 10/28/2017 15:08    Procedures Procedures (including critical care time)  Medications Ordered in ED Medications - No data to display   Initial Impression / Assessment and Plan / ED Course  I have reviewed the triage vital signs and the nursing notes.  Pertinent labs & imaging results that were available during my care of the patient were reviewed by me and considered in my medical decision making (see chart for details).     Final Clinical Impressions(s) / ED Diagnoses   Final diagnoses:  Fall, initial encounter  Injury of head, initial encounter   Labs:   Imaging: CT cervical, CT head without  Consults:  Therapeutics:  Discharge Meds:   Assessment/Plan: 44-year-old female status post fall.  This is mechanical in nature.  She has no neurological deficits, reassuring CT head and neck.  No other acute injuries that would necessitate further evaluation or management here in the ED.  Patient will be discharged with symptomatic care instructions and strict return precautions.  Both her and her son verbalized understanding and agree.    ED Discharge Orders    None       Eyvonne Mechanic, Cordelia Poche 10/28/17 1538    Mancel Bale, MD 10/29/17 [phone]

## 2017-10-28 NOTE — ED Provider Notes (Deleted)
Medical screening exam  83 year old female tripped and fell going up the escalator.  She reports striking her left elbow, right knee and the right side of her face.  No loss of consciousness, no neurological deficits, ambulatory on scene without significant pain or difficulty.  Patient well-appearing in no acute distress, no signs of facial trauma, minor tenderness to palpation of the posterior cervical region.  Facial abrasion noted to the left elbow full active range of motion, minor bruising to the right knee no significant tenderness.  Patient with a history of A. fib currently on Eliquis.  CT head and cervical films will be ordered patient appears stable for waiting room at this time.   Eyvonne MechanicHedges, Detravion Tester, PA-C 10/28/17 1355

## 2017-10-28 NOTE — Discharge Instructions (Signed)
Please read attached information. If you experience any new or worsening signs or symptoms please return to the emergency room for evaluation. Please follow-up with your primary care provider or specialist as discussed.  °

## 2017-10-28 NOTE — Patient Outreach (Signed)
Patient triggered Red on [facility] Heart Failure Dashboard, notification sent to Elliot CousinLisa Matthews, RN

## 2017-10-28 NOTE — ED Triage Notes (Signed)
Pt was on escalator  And turned lost balanced and landed on right side of face.  Pt complains of some neck tenderness and soreness to right face.  Pt is on Eliquis.  Examined at triage by PA. No LOC.

## 2017-10-29 ENCOUNTER — Telehealth: Payer: Self-pay | Admitting: Family Medicine

## 2017-10-29 ENCOUNTER — Encounter: Payer: Self-pay | Admitting: Gastroenterology

## 2017-10-29 ENCOUNTER — Other Ambulatory Visit: Payer: Self-pay | Admitting: *Deleted

## 2017-10-29 NOTE — Telephone Encounter (Signed)
Copied from CRM [id number]. Topic: General - Other >> Oct 29, 2017  2:16 PM Cipriano BunkerLambe, Annette S wrote: Reason for CRM: She said [facility] [address] called her to set up appt.  She is confused why she is having to go to [facility] and they are requesting her records.  Please call pt. And clarify   I called to talk to the patient., She states that she would prefer to go to [facility]. Can she be referred to them?

## 2017-10-29 NOTE — Patient Outreach (Signed)
[facility] Care Management  10/29/2017  Victoria HaltMary E Gomez 1925/06/03 [id number]    ED visit 12/19  RN spoke with pt today and verified identifiers and inquired further on the incident with a fall on yesterday. Pt states she is all clear on the CT scan and no changes in her routine or medications. Pt states just a bruise to her arm but she is doing well with no other residual affects. Reports her weight today via HF monitoring is 157 lbs with no symptoms. RN reminded pt of the next follow up call on 12/28 for ongoing transition of care contact and RN will inquire further on pt's ongoing management of care.   Elliot CousinLisa Matthews, RN Care Management Coordinator [facility] Main Office [phone]

## 2017-10-29 NOTE — Telephone Encounter (Signed)
Patient would like to go to [facility], Dr. Matthias HughsBuccini with [facility] was [facility] on [facility].  If its okay with you I will send it to [facility] instead.

## 2017-10-29 NOTE — Telephone Encounter (Signed)
Ok with me 

## 2017-11-06 ENCOUNTER — Ambulatory Visit: Payer: Self-pay

## 2017-11-06 ENCOUNTER — Other Ambulatory Visit: Payer: Self-pay | Admitting: *Deleted

## 2017-11-06 NOTE — Telephone Encounter (Signed)
   Reason for Disposition . Cough with cold symptoms (e.g., runny nose, postnasal drip, throat clearing)  Answer Assessment - Initial Assessment Questions 1. ONSET: "When did the cough begin?"      Started Yesterday 2. SEVERITY: "How bad is the cough today?"      Couldn't sleep last night 3. RESPIRATORY DISTRESS: "Describe your breathing."      No distress 4. FEVER: "Do you have a fever?" If so, ask: "What is your temperature, how was it measured, and when did it start?"     102 - down to 100 now 5. SPUTUM: "Describe the color of your sputum" (clear, white, yellow, green)     Yellow 6. HEMOPTYSIS: "Are you coughing up any blood?" If so ask: "How much?" (flecks, streaks, tablespoons, etc.)     No 7. CARDIAC HISTORY: "Do you have any history of heart disease?" (e.g., heart attack, congestive heart failure)      A-fib 8. LUNG HISTORY: "Do you have any history of lung disease?"  (e.g., pulmonary embolus, asthma, emphysema)     No 9. PE RISK FACTORS: "Do you have a history of blood clots?" (or: recent major surgery, recent prolonged travel, bedridden )     DVT leg 10. OTHER SYMPTOMS: "Do you have any other symptoms?" (e.g., runny nose, wheezing, chest pain)       Runny nose, no wheezing 11. PREGNANCY: "Is there any chance you are pregnant?" "When was your last menstrual period?"       No  12. TRAVEL: "Have you traveled out of the country in the last month?" (e.g., travel history, exposures)       No  Protocols used: COUGH - ACUTE PRODUCTIVE-A-AH  Pt. Will try home remedies. Has an appointment 11/11/17. Will call back if no better.

## 2017-11-06 NOTE — Patient Outreach (Signed)
[facility]) Care Management  11/06/2017  Loura HaltMary E Stegall [birth year] [id number]    Transition of Care  RN spoke with pt today and verified identifiers and inquired further on pt's management of care. Pt reports an ongoing productive cough with yellow sputum with a continues fever 102 even after taking Tylenol. RN requested pt to contact her provider with this information for possible intervention prior to the weekend. Pt indicated she would contact her provider today once this call ends. Pt aware she can contact this RN case manager with any barriers or issues if she is unable to obtain contact with her provider for possible intervention.  RN inquired further on her HF with reported weights last week 159 lbs, yesterday 159 lbs and today 158 lbs with no precipitating symptoms.  Reports history of swelling that has now resolved with TEDS and no additional problems have occurred. Plan of care discussed along with all the goals that were adjusted according to pt's progress. Will scheduled addition contact next month prior [facility] if no additional issues are encountered (pt receptive to this plan).  Elliot CousinLisa Fay Swider, RN Care Management Coordinator [facility] Main Office [phone]

## 2017-11-11 ENCOUNTER — Encounter: Payer: Self-pay | Admitting: Family Medicine

## 2017-11-11 ENCOUNTER — Ambulatory Visit (INDEPENDENT_AMBULATORY_CARE_PROVIDER_SITE_OTHER): Payer: Medicare Other | Admitting: Family Medicine

## 2017-11-11 VITALS — BP 124/74 | HR 88 | Temp 98.3°F | Ht 63.0 in | Wt 157.0 lb

## 2017-11-11 DIAGNOSIS — N183 Chronic kidney disease, stage 3 unspecified: Secondary | ICD-10-CM

## 2017-11-11 DIAGNOSIS — I5042 Chronic combined systolic (congestive) and diastolic (congestive) heart failure: Secondary | ICD-10-CM | POA: Diagnosis not present

## 2017-11-11 DIAGNOSIS — I482 Chronic atrial fibrillation: Secondary | ICD-10-CM | POA: Diagnosis not present

## 2017-11-11 DIAGNOSIS — J40 Bronchitis, not specified as acute or chronic: Secondary | ICD-10-CM | POA: Diagnosis not present

## 2017-11-11 DIAGNOSIS — E119 Type 2 diabetes mellitus without complications: Secondary | ICD-10-CM

## 2017-11-11 DIAGNOSIS — I4821 Permanent atrial fibrillation: Secondary | ICD-10-CM

## 2017-11-11 DIAGNOSIS — I10 Essential (primary) hypertension: Secondary | ICD-10-CM

## 2017-11-11 MED ORDER — ALBUTEROL SULFATE HFA 108 (90 BASE) MCG/ACT IN AERS: 2.0000 | INHALATION_SPRAY | Freq: Four times a day (QID) | RESPIRATORY_TRACT | 0 refills | Status: DC | PRN

## 2017-11-11 MED ORDER — METHYLPREDNISOLONE ACETATE 40 MG/ML IJ SUSP
40.0000 mg | Freq: Once | INTRAMUSCULAR | Status: AC
  Administered 2017-11-11: 40 mg via INTRAMUSCULAR

## 2017-11-11 MED ORDER — BENZONATATE 100 MG PO CAPS: 100.0000 mg | ORAL_CAPSULE | Freq: Two times a day (BID) | ORAL | 0 refills | Status: DC | PRN

## 2017-11-11 NOTE — Assessment & Plan Note (Signed)
s: controlled on imdur 90mg , metoprolol 25 mg BID, lasix 60mg  daily.  Losartan 100 mg had been stopped in the past due to orthostatic issues.  It does appear this was restarted by cardiology on September 29, 2017. BP Readings from Last 3 Encounters:  11/11/17 124/74  10/28/17 140/74  10/23/17 112/72  A/P: blood pressure goal of <140/90. Continue current meds

## 2017-11-11 NOTE — Progress Notes (Signed)
Subjective:  Victoria Gomez is a 84 y.o. year old very pleasant female patient who presents for/with See problem oriented charting ROS-has some cough and congestion.  No fever or chills.  No shortness of breath increased from baseline.  Declines headache or loss of consciousness  Past Medical History-  Patient Active Problem List   Diagnosis Date Noted  . Diabetes mellitus II 09/22/2013    Priority: High  . Permanent atrial fibrillation (HCC) 09/21/2013    Priority: High  . H/O sinus bradycardia 08/10/2012    Priority: High  . Combined systolic and diastolic congestive heart failure (HCC) 11/17/2011    Priority: High  . CAD S/P percutaneous coronary angioplasty 08/03/2007    Priority: High  . Osteopenia 06/28/2015    Priority: Medium  . Vitamin B12 deficiency 07/28/2014    Priority: Medium  . CKD (chronic kidney disease), stage III (HCC) 09/22/2013    Priority: Medium  . LBBB (left bundle branch block) 09/22/2013    Priority: Medium  . Interstitial cystitis     Priority: Medium  . Hyperlipidemia 03/07/2008    Priority: Medium  . Hypothyroidism 08/03/2007    Priority: Medium  . Essential hypertension 08/03/2007    Priority: Medium  . Internal hemorrhoids     Priority: Low  . Orthostatic hypotension 08/10/2012    Priority: Low  . Fatigue 04/20/2012    Priority: Low  . ARTHRITIS 12/30/2010    Priority: Low  . Left lumbar radiculopathy 12/11/2010    Priority: Low  . Esophageal reflux 10/18/2007    Priority: Low  . BARRETT'S ESOPHAGUS, HX OF 08/03/2007    Priority: Low  . Acute respiratory failure with hypoxemia (HCC) 09/13/2017  . Acute on chronic combined systolic and diastolic CHF (congestive heart failure) (HCC) 09/13/2017    Class: Acute  . Hypertensive heart and kidney disease with acute on chronic combined systolic and diastolic congestive heart failure and stage 3 chronic kidney disease (HCC) 09/13/2017    Class: Acute  . Chronic anticoagulation 07/09/2017  .  Left groin hernia 06/25/2017    Medications- reviewed and updated Current Outpatient Medications  Medication Sig Dispense Refill  . acetaminophen (TYLENOL) 500 MG tablet Take 500 mg by mouth every 6 (six) hours as needed for mild pain. Reported on 12/12/2015    . apixaban (ELIQUIS) 2.5 MG TABS tablet Take 1 tablet (2.5 mg total) 2 (two) times daily by mouth. 180 tablet 1  . diclofenac sodium (VOLTAREN) 1 % GEL Apply 2 g topically 4 (four) times daily. 100 g 3  . furosemide (LASIX) 20 MG tablet Take 3 tablets (60 mg total) by mouth daily. 90 tablet 0  . glucose blood (ONETOUCH VERIO) test strip USE TO TEST BLOOD SUGARS DAILY. DX: E11.9 100 each 3  . ipratropium (ATROVENT) 0.06 % nasal spray Place 2 sprays into both nostrils 4 (four) times daily. 15 mL 0  . isosorbide mononitrate (IMDUR) 30 MG 24 hr tablet Take 3 tablets (90 mg total) daily by mouth. 90 tablet 0  . lansoprazole (PREVACID) 30 MG capsule TAKE 1 CAPSULE TWICE DAILY 180 capsule 1  . levothyroxine (SYNTHROID) 50 MCG tablet Take 1 tablet (50 mcg total) by mouth daily before breakfast. 90 tablet 3  . losartan (COZAAR) 100 MG tablet TAKE 1 TABLET BY MOUTH EVERY DAY 30 tablet 5  . metoprolol tartrate (LOPRESSOR) 25 MG tablet Take 1 tablet (25 mg total) by mouth 2 (two) times daily. 60 tablet 2  . nitroGLYCERIN (NITROSTAT) 0.4 MG SL  tablet ONE UNDER THE TONGUE EVERY 5 MINUTES AS NEEDED FOR CHEST PAIN. IF PAIN AFTER 3 PILLS, CALL 911 25 tablet 1  . ONETOUCH DELICA LANCETS 33G MISC Use to test blood sugars daily. Dx: E11.9 100 each 11  . pentosan polysulfate (ELMIRON) 100 MG capsule Take 100 mg by mouth 2 (two) times daily.     . potassium chloride (K-DUR,KLOR-CON) 10 MEQ tablet Take 1 tablet (10 mEq total) daily by mouth. 30 tablet 0  . pravastatin (PRAVACHOL) 40 MG tablet TAKE 1 TABLET DAILY 90 tablet 3   Objective: BP 124/74 (BP Location: Left Arm, Patient Position: Sitting, Cuff Size: Large)   Pulse 88   Temp 98.3 F (36.8 C) (Oral)    Ht 5\' 3"  (1.6 m)   Wt 157 lb (71.2 kg)   SpO2 99%   BMI 27.81 kg/m  Gen: NAD, resting comfortably Pharynx very mildly erythematous.  Nasal turbinates erythematous with some clear drainage CV: RRR no murmurs rubs or gallops Lungs: Diffuse rhonchi.  Sparing diffuse wheeze.  No crackles.   Abdomen: soft/nontender/nondistended/normal bowel sounds.  Ext: no edema, no significant tenderness with palpation of elbows or knees. Skin: warm, dry  Assessment/Plan:  Patient originally presented for hospital/ED follow-up after she had a mechanical fall while out shopping.  She was transported to the hospital due to being on Eliquis and the fact she hit her head.  She had a CT head and cervical spine which were largely reassuring.  She did have some degenerative changes of the spine.  She has minimal pain from this episode.  She does state her only symptom today is that of cough, chest congestion.  She states her son came home sick and got over half the family sick.  She originally had a temperature up to 102.  She tried Delsym which did not help.  She tried [place] which she had from last respiratory illness which has helped only slightly.  She continues to have heavy cough and chest congestion.  On exam she was wheezing and had rhonchi.  This appears to likely be bronchitis.  We will treat with Depo-Medrol 40 mg as well as refill Tessalon and give a refill of albuterol.  If she is not improving by Monday of next week or if she worsens considering her age would start a course of doxycycline 100 mg twice daily.  Combined systolic and diastolic congestive heart failure (HCC) S: weight down from 160 in November to 157. Compliant with lasix 60mg  daily.  No edema A/P: Appears euvolemic.  Continue current Lasix  Permanent atrial fibrillation (HCC) Tolerating metoprolol 25 mill grams twice daily.  Compliant with Eliquis 2.5 mg twice a day for anticoagulation.  Diabetes mellitus II Has been well controlled  without medication Lab Results  Component Value Date   HGBA1C 6.3 (H) 09/13/2017  Reasonable to use steroid injection today for bronchitis with A1c well below 7 on no meds.  Essential hypertension s: controlled on imdur 90mg , metoprolol 25 mg BID, lasix 60mg  daily.  Losartan 100 mg had been stopped in the past due to orthostatic issues.  It does appear this was restarted by cardiology on September 29, 2017. BP Readings from Last 3 Encounters:  11/11/17 124/74  10/28/17 140/74  10/23/17 112/72  A/P: blood pressure goal of <140/90. Continue current meds  CKD (chronic kidney disease), stage III (HCC) Glomerular filtration rate largely stable in the 35-50 range.  Most recently checked in November.  We are working to control her blood pressure  and cholesterol.  She should avoid NSAIDs.  Likely she is on Prevacid instead of a PPI as well.  Future Appointments  Date Time Provider Department Center  12/17/2017  8:45 AM Meryl Dare, MD [facility]  12/28/2017 11:00 AM Shelva Majestic, MD [facility]   Meds ordered this encounter  Medications  . albuterol (PROVENTIL HFA;VENTOLIN HFA) 108 (90 Base) MCG/ACT inhaler    Sig: Inhale 2 puffs into the lungs every 6 (six) hours as needed for wheezing or shortness of breath.    Dispense:  1 Inhaler    Refill:  0  . benzonatate (TESSALON) 100 MG capsule    Sig: Take 1 capsule (100 mg total) by mouth 2 (two) times daily as needed for cough.    Dispense:  20 capsule    Refill:  0  . methylPREDNISolone acetate (DEPO-MEDROL) injection 40 mg    Return precautions advised.  Tana Conch, MD

## 2017-11-11 NOTE — Assessment & Plan Note (Signed)
Has been well controlled without medication Lab Results  Component Value Date   HGBA1C 6.3 (H) 09/13/2017  Reasonable to use steroid injection today for bronchitis with A1c well below 7 on no meds.

## 2017-11-11 NOTE — Assessment & Plan Note (Signed)
S: weight down from 160 in November to 157. Compliant with lasix 60mg  daily.  No edema A/P: Appears euvolemic.  Continue current Lasix

## 2017-11-11 NOTE — Assessment & Plan Note (Signed)
Glomerular filtration rate largely stable in the 35-50 range.  Most recently checked in November.  We are working to control her blood pressure and cholesterol.  She should avoid NSAIDs.  Likely she is on Prevacid instead of a PPI as well.

## 2017-11-11 NOTE — Assessment & Plan Note (Signed)
Tolerating metoprolol 25 mill grams twice daily.  Compliant with Eliquis 2.5 mg twice a day for anticoagulation.

## 2017-11-11 NOTE — Patient Instructions (Addendum)
Glad no major issus related to the fall  Sorry you got sick over the holidays. This sounds like bronchitis. 95% of bronchitis is viral so we are going to give this a few days and see if it gets better with a shot of steroid today as well as an inhaler albuterol. Also refilled cough medicine. If no better by Monday or symptoms worsen- give me a call and I will call in an antibiotic

## 2017-11-13 ENCOUNTER — Other Ambulatory Visit: Payer: Self-pay | Admitting: Family Medicine

## 2017-11-16 ENCOUNTER — Telehealth: Payer: Self-pay | Admitting: Family Medicine

## 2017-11-16 NOTE — Telephone Encounter (Signed)
Please advise regarding medication request.  Copied from CRM [phone]. Topic: General - Other >> Nov 16, 2017 11:27 AM Percival SpanishKennedy, Cheryl W wrote:  Pt call to say [name] told her to call back if she was better and he would call her in a antibiotic  Pharmacy   [facility] [address]

## 2017-11-17 ENCOUNTER — Telehealth: Payer: Self-pay | Admitting: Family Medicine

## 2017-11-17 NOTE — Telephone Encounter (Signed)
Copied from CRM [id number]. Topic: Inquiry >> Nov 17, 2017 12:32 PM Yvonna Alanisobinson, Andra M wrote: Reason for CRM: Patient called stating that Dr. Durene CalHunter was to send a prescription for an antibiotic to her Pharmacy, but it was not there. Patient also stated that she needs a refill of potassium chloride (K-DUR,KLOR-CON) 10 MEQ tablet. [facility]'s preferred [facility] is [id number], [place] - [address] [phone] (Phone)  [phone] (Fax). Please call this patient once the prescriptions are completed.        Thank You!!!

## 2017-11-17 NOTE — Telephone Encounter (Signed)
[name], ok to prescribe antibiotic based on encounter note? Also, please advise on potassium as you have not filled that prescription before.

## 2017-11-17 NOTE — Telephone Encounter (Signed)
Copied from CRM #[id number]. Topic: Inquiry °>> Nov 17, 2017 12:32 PM Robinson, Andra M wrote: °Reason for CRM: Patient called stating that Dr. Hunter was to send a prescription for an antibiotic to her Pharmacy, but it was not there. Patient also stated that she needs a refill of potassium chloride (K-DUR,KLOR-CON) 10 MEQ tablet. Patient's °preferred pharmacy [id number] [place], [place] - [address] [phone] (Phone)  [phone] (Fax). Please call this patient once the prescriptions are completed.        Thank You!!! ° ° ° °

## 2017-11-17 NOTE — Telephone Encounter (Signed)
Yes thanks, may send in doxycycline 100mg  twice a day #14. May refill potassium.

## 2017-11-17 NOTE — Telephone Encounter (Signed)
Pt medication request; see note from [name] dated 11/11/17 regarding antibiotic

## 2017-11-18 ENCOUNTER — Other Ambulatory Visit: Payer: Self-pay

## 2017-11-18 MED ORDER — DOXYCYCLINE HYCLATE 100 MG PO TABS: 100.0000 mg | ORAL_TABLET | Freq: Two times a day (BID) | ORAL | 0 refills | Status: DC

## 2017-11-18 MED ORDER — POTASSIUM CHLORIDE CRYS ER 10 MEQ PO TBCR: 10.0000 meq | EXTENDED_RELEASE_TABLET | Freq: Every day | ORAL | 2 refills | Status: DC

## 2017-11-18 NOTE — Telephone Encounter (Signed)
Prescriptions sent in as advised. Called patient and let her know.

## 2017-11-23 ENCOUNTER — Other Ambulatory Visit: Payer: Self-pay | Admitting: Family Medicine

## 2017-12-07 ENCOUNTER — Other Ambulatory Visit: Payer: Self-pay | Admitting: Family Medicine

## 2017-12-07 ENCOUNTER — Encounter: Payer: Self-pay | Admitting: *Deleted

## 2017-12-07 NOTE — Patient Outreach (Signed)
[facility]) Care Management  12/07/2017  Victoria Gomez 04-05-1925 [id number]   RN attempted outreach call to pt today however unsuccessful but RN able to leave a HIPAA approved voice message requesting a call back. Will further inquired on pt's ongoing management of care at that time.   Elliot CousinLisa Vicent Febles, RN Care Management Coordinator [facility] Main Office [phone]

## 2017-12-08 ENCOUNTER — Encounter: Payer: Self-pay | Admitting: *Deleted

## 2017-12-08 ENCOUNTER — Other Ambulatory Visit: Payer: Self-pay | Admitting: *Deleted

## 2017-12-08 NOTE — Patient Outreach (Signed)
[facility]) Care Management  12/08/2017  PERRI ARAGONES 1925/09/23 [id number]    RN spoke with pt today and inquired further on her ongoing management of care. Pt states she is doing much better and contacted her provider as recommended on the last call. States she was provided another round of antibiotics and no longer having symptoms. Pt reports her weights last week at 151 lbs, today and yesterday both days 153.4 lbs and denies any related symptoms of HF with no swelling or issues with her breathing. Pt remains in the GREEN zone with no precipitating symptoms. RN discussed her plan of care and goals. Based upon the assessment for today pt has met all her goals and RN will continue to enougae pt with managing her medical conditions with early interventions. RN has reviewed all interventions and reiterated on available resources with [facility]. All offered any other community resources that maybe needed at this time (none needed). Pt is aware to contact the [facility] office if any issues or inquires in the future. Case will be closed and pt's primary provider will be notified of pt's disposition with [facility].  Raina Mina, RN Care Management Coordinator [place] Office [phone]

## 2017-12-09 DIAGNOSIS — N3010 Interstitial cystitis (chronic) without hematuria: Secondary | ICD-10-CM | POA: Diagnosis not present

## 2017-12-09 DIAGNOSIS — N3000 Acute cystitis without hematuria: Secondary | ICD-10-CM | POA: Diagnosis not present

## 2017-12-17 ENCOUNTER — Ambulatory Visit: Payer: Medicare Other | Admitting: Gastroenterology

## 2017-12-21 ENCOUNTER — Other Ambulatory Visit: Payer: Self-pay | Admitting: *Deleted

## 2017-12-21 MED ORDER — METOPROLOL TARTRATE 25 MG PO TABS: 25.0000 mg | ORAL_TABLET | Freq: Two times a day (BID) | ORAL | 0 refills | Status: DC

## 2017-12-23 ENCOUNTER — Other Ambulatory Visit: Payer: Self-pay | Admitting: Family Medicine

## 2017-12-28 ENCOUNTER — Ambulatory Visit: Payer: Medicare Other | Admitting: Family Medicine

## 2017-12-28 ENCOUNTER — Encounter: Payer: Self-pay | Admitting: Family Medicine

## 2017-12-28 ENCOUNTER — Ambulatory Visit (INDEPENDENT_AMBULATORY_CARE_PROVIDER_SITE_OTHER): Payer: Medicare Other | Admitting: Family Medicine

## 2017-12-28 VITALS — BP 138/70 | HR 70 | Temp 98.3°F | Ht 63.0 in | Wt 157.8 lb

## 2017-12-28 DIAGNOSIS — E119 Type 2 diabetes mellitus without complications: Secondary | ICD-10-CM

## 2017-12-28 DIAGNOSIS — N183 Chronic kidney disease, stage 3 unspecified: Secondary | ICD-10-CM

## 2017-12-28 DIAGNOSIS — I482 Chronic atrial fibrillation: Secondary | ICD-10-CM

## 2017-12-28 DIAGNOSIS — Z8719 Personal history of other diseases of the digestive system: Secondary | ICD-10-CM

## 2017-12-28 DIAGNOSIS — I5042 Chronic combined systolic (congestive) and diastolic (congestive) heart failure: Secondary | ICD-10-CM

## 2017-12-28 DIAGNOSIS — I10 Essential (primary) hypertension: Secondary | ICD-10-CM | POA: Diagnosis not present

## 2017-12-28 DIAGNOSIS — E034 Atrophy of thyroid (acquired): Secondary | ICD-10-CM | POA: Diagnosis not present

## 2017-12-28 DIAGNOSIS — I4821 Permanent atrial fibrillation: Secondary | ICD-10-CM

## 2017-12-28 LAB — COMPREHENSIVE METABOLIC PANEL
ALT: 11 U/L (ref 0–35)
AST: 16 U/L (ref 0–37)
Albumin: 3.9 g/dL (ref 3.5–5.2)
Alkaline Phosphatase: 65 U/L (ref 39–117)
BUN: 22 mg/dL (ref 6–23)
CO2: 29 meq/L (ref 19–32)
Calcium: 9.2 mg/dL (ref 8.4–10.5)
Chloride: 107 mEq/L (ref 96–112)
Creatinine, Ser: 0.87 mg/dL (ref 0.40–1.20)
GFR: 64.86 mL/min (ref >60.00)
Glucose, Bld: 93 mg/dL (ref 70–99)
POTASSIUM: 4.2 meq/L (ref 3.5–5.1)
SODIUM: 143 meq/L (ref 135–145)
Total Bilirubin: 0.4 mg/dL (ref 0.2–1.2)
Total Protein: 6.8 g/dL (ref 6.0–8.3)

## 2017-12-28 LAB — CBC
HCT: 38.6 % (ref 36.0–46.0)
Hemoglobin: 12.4 g/dL (ref 12.0–15.0)
MCHC: 32.1 g/dL (ref 30.0–36.0)
MCV: 90.1 fl (ref 78.0–100.0)
PLATELETS: 186 10*3/uL (ref 150.0–400.0)
RBC: 4.28 Mil/uL (ref 3.87–5.11)
RDW: 15.3 % (ref 11.5–15.5)
WBC: 6.5 10*3/uL (ref 4.0–10.5)

## 2017-12-28 LAB — TSH: TSH: 1 u[IU]/mL (ref 0.35–4.50)

## 2017-12-28 LAB — HEMOGLOBIN A1C: Hgb A1c MFr Bld: 6.5 % (ref 4.6–6.5)

## 2017-12-28 NOTE — Assessment & Plan Note (Signed)
S: GFR in 35-50 range typically. We are working to control BP and lipids. Knows to avoid nsaids.  A/P: interestingly this is the best GFR we have seen at some time over 60 with improvement in creatinine as well- will monitor- if this persists for a year at least  would change to CKD II

## 2017-12-28 NOTE — Assessment & Plan Note (Signed)
S: patient doing well on metoprolol 25mg  BID. Also on eliquis 2.5mg  BID for anticoagulation. Remains in a fib A/P: continue current medications

## 2017-12-28 NOTE — Assessment & Plan Note (Signed)
S: diet controlled.  Lab Results  Component Value Date   HGBA1C 6.5 12/28/2017   HGBA1C 6.3 (H) 09/13/2017   HGBA1C 6.4 06/25/2017   A/P: updated a1c continues to look great below goal of 7- updated foot exam reassuring

## 2017-12-28 NOTE — Assessment & Plan Note (Signed)
S: weight stable. Edema stable- none. On lasix 60mg  daily. Takes potassium daily.  A/P: euvolemic- continue current meds

## 2017-12-28 NOTE — Patient Instructions (Signed)
Please stop by lab before you go   

## 2017-12-28 NOTE — Assessment & Plan Note (Signed)
S: Lab Results  Component Value Date   TSH 1.00 12/28/2017   On thyroid medication-levothyroxine 50 mcg A/P: updated tsh reassuring- continue current meds

## 2017-12-28 NOTE — Assessment & Plan Note (Signed)
S: controlled on imdur 90mg , metoprolol 25mg  BID, lasix 60mg , losartan 100mg .  BP Readings from Last 3 Encounters:  12/28/17 138/70  11/11/17 124/74  10/28/17 140/74  A/P: We discussed blood pressure goal of <140/90. Continue current meds- systolic high normal- will monitor

## 2017-12-28 NOTE — Assessment & Plan Note (Signed)
S: Taking  Prevacid twice a day. Has [facility] GI appointment. Mild burning at the base of her neck even on prevacid BID but has improved overall A/P: I thank Dr. Jimmey RalphParker for referring to GI with history of Barrett's- she agress to follow up

## 2017-12-28 NOTE — Progress Notes (Signed)
Subjective:  Victoria Gomez is a 84 y.o. year old very pleasant female patient who presents for/with See problem oriented charting ROS- no chest pain. No edema. No increased shortness of breath or edema.    Past Medical History-  Patient Active Problem List   Diagnosis Date Noted  . Diabetes mellitus II 09/22/2013    Priority: High  . Permanent atrial fibrillation (HCC) 09/21/2013    Priority: High  . H/O sinus bradycardia 08/10/2012    Priority: High  . Combined systolic and diastolic congestive heart failure (HCC) 11/17/2011    Priority: High  . CAD S/P percutaneous coronary angioplasty 08/03/2007    Priority: High  . Osteopenia 06/28/2015    Priority: Medium  . Vitamin B12 deficiency 07/28/2014    Priority: Medium  . CKD (chronic kidney disease), stage III (HCC) 09/22/2013    Priority: Medium  . LBBB (left bundle branch block) 09/22/2013    Priority: Medium  . Interstitial cystitis     Priority: Medium  . Hyperlipidemia 03/07/2008    Priority: Medium  . Hypothyroidism 08/03/2007    Priority: Medium  . Essential hypertension 08/03/2007    Priority: Medium  . Chronic anticoagulation 07/09/2017    Priority: Low  . Left groin hernia 06/25/2017    Priority: Low  . Internal hemorrhoids     Priority: Low  . Orthostatic hypotension 08/10/2012    Priority: Low  . Fatigue 04/20/2012    Priority: Low  . ARTHRITIS 12/30/2010    Priority: Low  . Left lumbar radiculopathy 12/11/2010    Priority: Low  . Esophageal reflux 10/18/2007    Priority: Low  . History of Barrett's esophagus 08/03/2007    Priority: Low    Medications- reviewed and updated Current Outpatient Medications  Medication Sig Dispense Refill  . acetaminophen (TYLENOL) 500 MG tablet Take 500 mg by mouth every 6 (six) hours as needed for mild pain. Reported on 12/12/2015    . albuterol (PROVENTIL HFA;VENTOLIN HFA) 108 (90 Base) MCG/ACT inhaler TAKE 2 PUFFS BY MOUTH EVERY 6 HOURS AS NEEDED FOR WHEEZE OR  SHORTNESS OF BREATH 1 Inhaler 0  . apixaban (ELIQUIS) 2.5 MG TABS tablet Take 1 tablet (2.5 mg total) 2 (two) times daily by mouth. 180 tablet 1  . diclofenac sodium (VOLTAREN) 1 % GEL Apply 2 g topically 4 (four) times daily. 100 g 3  . furosemide (LASIX) 20 MG tablet TAKE 3 TABLETS BY MOUTH EVERY DAY 90 tablet 5  . glucose blood (ONETOUCH VERIO) test strip USE TO TEST BLOOD SUGARS DAILY. DX: E11.9 100 each 3  . isosorbide mononitrate (IMDUR) 60 MG 24 hr tablet TAKE 1 AND 1/2 TABLETS     (=90MG ) DAILY              *KREMERS URBAN* 135 tablet 3  . lansoprazole (PREVACID) 30 MG capsule TAKE 1 CAPSULE TWICE DAILY 180 capsule 1  . levothyroxine (SYNTHROID) 50 MCG tablet Take 1 tablet (50 mcg total) by mouth daily before breakfast. 90 tablet 3  . losartan (COZAAR) 100 MG tablet TAKE 1 TABLET BY MOUTH EVERY DAY 30 tablet 5  . metoprolol tartrate (LOPRESSOR) 25 MG tablet Take 1 tablet (25 mg total) by mouth 2 (two) times daily. 180 tablet 0  . nitroGLYCERIN (NITROSTAT) 0.4 MG SL tablet ONE UNDER THE TONGUE EVERY 5 MINUTES AS NEEDED FOR CHEST PAIN. IF PAIN AFTER 3 PILLS, CALL 911 25 tablet 1  . ONETOUCH DELICA LANCETS 33G MISC Use to test blood sugars daily.  Dx: E11.9 100 each 11  . pentosan polysulfate (ELMIRON) 100 MG capsule Take 100 mg by mouth 2 (two) times daily.     . potassium chloride (K-DUR,KLOR-CON) 10 MEQ tablet Take 1 tablet (10 mEq total) by mouth daily. 30 tablet 2  . pravastatin (PRAVACHOL) 40 MG tablet TAKE 1 TABLET DAILY 90 tablet 3   No current facility-administered medications for this visit.     Objective: BP 138/70 (BP Location: Left Arm, Patient Position: Sitting, Cuff Size: Large)   Pulse 70   Temp 98.3 F (36.8 C) (Oral)   Ht 5\' 3"  (1.6 m)   Wt 157 lb 12.8 oz (71.6 kg)   SpO2 97%   BMI 27.95 kg/m  Gen: NAD, resting comfortably, appears younger than stated age CV: irregularly irregular no murmurs rubs or gallops Lungs: CTAB no crackles, wheeze, rhonchi Abdomen:  soft/nontender/nondistended/normal bowel sounds. No rebound or guarding.  Ext: no pitting edema Skin: warm, dry  Diabetic Foot Exam - Simple   Simple Foot Form Diabetic Foot exam was performed with the following findings:  Yes 12/28/2017 10:28 PM  Visual Inspection No deformities, no ulcerations, no other skin breakdown bilaterally:  Yes Sensation Testing Intact to touch and monofilament testing bilaterally:  Yes Pulse Check Posterior Tibialis and Dorsalis pulse intact bilaterally:  Yes Comments    Assessment/Plan:  Combined systolic and diastolic congestive heart failure (HCC) S: weight stable. Edema stable- none. On lasix 60mg  daily. Takes potassium daily.  A/P: euvolemic- continue current meds  Permanent atrial fibrillation (HCC) S: patient doing well on metoprolol 25mg  BID. Also on eliquis 2.5mg  BID for anticoagulation. Remains in a fib A/P: continue current medications  Diabetes mellitus II S: diet controlled.  Lab Results  Component Value Date   HGBA1C 6.5 12/28/2017   HGBA1C 6.3 (H) 09/13/2017   HGBA1C 6.4 06/25/2017   A/P: updated a1c continues to look great below goal of 7- updated foot exam reassuring  Essential hypertension S: controlled on imdur 90mg , metoprolol 25mg  BID, lasix 60mg , losartan 100mg .  BP Readings from Last 3 Encounters:  12/28/17 138/70  11/11/17 124/74  10/28/17 140/74  A/P: We discussed blood pressure goal of <140/90. Continue current meds- systolic high normal- will monitor  CKD (chronic kidney disease), stage III (HCC) S: GFR in 35-50 range typically. We are working to control BP and lipids. Knows to avoid nsaids.  A/P: interestingly this is the best GFR we have seen at some time over 60 with improvement in creatinine as well- will monitor- if this persists for a year at least  would change to CKD II  History of Barrett's esophagus S: Taking  Prevacid twice a day. Has upoming GI appointment. Mild burning at the base of her neck even on  prevacid BID but has improved overall A/P: I thank Dr. Jimmey Ralph for referring to GI with history of Barrett's- she agress to follow up   Hypothyroidism S: Lab Results  Component Value Date   TSH 1.00 12/28/2017   On thyroid medication-levothyroxine 50 mcg A/P: updated tsh reassuring- continue current meds   Future Appointments  Date Time Provider Department Center  01/27/2018 10:00 AM Meryl Dare, MD [facility]  04/27/2018  2:30 PM Shelva Majestic, MD [facility]   Return in about 4 months (around 04/27/2018) for follow up- or sooner if needed.  Lab/Order associations: Type 2 diabetes mellitus without complication, without long-term current use of insulin (HCC) - Plan: CBC, Comprehensive metabolic panel, Hemoglobin A1c  Hypothyroidism due to acquired  atrophy of thyroid - Plan: TSH  Return precautions advised.  Tana ConchStephen Hunter, MD

## 2018-01-01 ENCOUNTER — Other Ambulatory Visit: Payer: Self-pay | Admitting: Family Medicine

## 2018-01-08 ENCOUNTER — Other Ambulatory Visit: Payer: Self-pay

## 2018-01-08 MED ORDER — NITROGLYCERIN 0.4 MG SL SUBL: SUBLINGUAL_TABLET | SUBLINGUAL | 1 refills | Status: DC

## 2018-01-18 ENCOUNTER — Other Ambulatory Visit: Payer: Self-pay

## 2018-01-18 MED ORDER — LOSARTAN POTASSIUM 100 MG PO TABS: 100.0000 mg | ORAL_TABLET | Freq: Every day | ORAL | 5 refills | Status: DC

## 2018-01-19 ENCOUNTER — Other Ambulatory Visit: Payer: Self-pay

## 2018-01-19 MED ORDER — POTASSIUM CHLORIDE CRYS ER 10 MEQ PO TBCR: 10.0000 meq | EXTENDED_RELEASE_TABLET | Freq: Every day | ORAL | 2 refills | Status: DC

## 2018-01-26 ENCOUNTER — Other Ambulatory Visit: Payer: Self-pay | Admitting: Cardiology

## 2018-01-26 DIAGNOSIS — K2100 Gastro-esophageal reflux disease with esophagitis, without bleeding: Secondary | ICD-10-CM

## 2018-01-27 ENCOUNTER — Encounter: Payer: Self-pay | Admitting: Gastroenterology

## 2018-01-27 ENCOUNTER — Telehealth: Payer: Self-pay

## 2018-01-27 ENCOUNTER — Ambulatory Visit (INDEPENDENT_AMBULATORY_CARE_PROVIDER_SITE_OTHER): Payer: Medicare Other | Admitting: Gastroenterology

## 2018-01-27 VITALS — BP 116/68 | HR 76 | Ht 63.0 in | Wt 157.1 lb

## 2018-01-27 DIAGNOSIS — K219 Gastro-esophageal reflux disease without esophagitis: Secondary | ICD-10-CM | POA: Diagnosis not present

## 2018-01-27 DIAGNOSIS — Z7901 Long term (current) use of anticoagulants: Secondary | ICD-10-CM

## 2018-01-27 DIAGNOSIS — R1310 Dysphagia, unspecified: Secondary | ICD-10-CM | POA: Diagnosis not present

## 2018-01-27 DIAGNOSIS — R1319 Other dysphagia: Secondary | ICD-10-CM | POA: Diagnosis not present

## 2018-01-27 NOTE — Patient Instructions (Signed)
You have been scheduled for an endoscopy. Please follow written instructions given to you at your visit today. If you use inhalers (even only as needed), please bring them with you on the day of your procedure. Your physician has requested that you go to www.startemmi.com and enter the access code given to you at your visit today. This web site gives a general overview about your procedure. However, you should still follow specific instructions given to you by our office regarding your preparation for the procedure.  Normal BMI (Body Mass Index- based on height and weight) is between 23 and 30. Your BMI today is Body mass index is 27.83 kg/m. [name]. Please consider follow up  regarding your BMI with your Primary Care Provider.  Thank you for choosing me and [facility].  Venita LickMalcolm T. Pleas KochStark, Jr., MD., Clementeen GrahamFACG

## 2018-01-27 NOTE — Progress Notes (Signed)
cc: This is a 84 year old female with chronic GERD and new onset dysphagia/odynophagia.    HPI: Patient has had GERD for many years.  Reflux symptoms have remained under good control on Prevacid.  She had bronchitis and then another upper respiratory infection with a sore throat and cough and was treated with antibiotics a couple months ago.  She has mild dysphasia and odynophagia for the past several weeks that has not improved.  Last EGD in 2006 as below.  Several places in her chart indicated a history of Barrett's esophagus but this is not correct.   EGD 2006 showed an irregular z-line ESOPHAGUS, BIOPSIES AT 40 CM: ESOPHAGOGASTRIC JUNCTION MUCOSA WITH MILD INFLAMMATION. NO INTESTINAL METAPLASIA, DYSPLASIA OR MALIGNANCY IDENTIFIED (BIOPSY)  Denies weight loss, abdominal pain, constipation, diarrhea, change in stool caliber, melena, hematochezia, nausea, vomiting, chest pain.   Allergies  Allergen Reactions  . Codeine Hives and Nausea And Vomiting  . Sulfonamide Derivatives Swelling    Turns red   Outpatient Medications Prior to Visit  Medication Sig Dispense Refill  . acetaminophen (TYLENOL) 500 MG tablet Take 500 mg by mouth every 6 (six) hours as needed for mild pain. Reported on 12/12/2015    . albuterol (PROVENTIL HFA;VENTOLIN HFA) 108 (90 Base) MCG/ACT inhaler TAKE 2 PUFFS BY MOUTH EVERY 6 HOURS AS NEEDED FOR WHEEZE OR SHORTNESS OF BREATH 8.5 Inhaler 0  . apixaban (ELIQUIS) 2.5 MG TABS tablet Take 1 tablet (2.5 mg total) 2 (two) times daily by mouth. 180 tablet 1  . diclofenac sodium (VOLTAREN) 1 % GEL Apply 2 g topically 4 (four) times daily. 100 g 3  . furosemide (LASIX) 20 MG tablet TAKE 3 TABLETS BY MOUTH EVERY DAY 90 tablet 5  . glucose blood (ONETOUCH VERIO) test strip USE TO TEST BLOOD SUGARS DAILY. DX: E11.9 100 each 3  . isosorbide mononitrate (IMDUR) 60 MG 24 hr tablet TAKE 1 AND 1/2 TABLETS     (=90MG ) DAILY              *KREMERS URBAN* 135 tablet 3  . lansoprazole  (PREVACID) 30 MG capsule TAKE 1 CAPSULE TWICE DAILY 180 capsule 1  . levothyroxine (SYNTHROID) 50 MCG tablet Take 1 tablet (50 mcg total) by mouth daily before breakfast. 90 tablet 3  . metoprolol tartrate (LOPRESSOR) 25 MG tablet Take 1 tablet (25 mg total) by mouth 2 (two) times daily. 180 tablet 0  . nitroGLYCERIN (NITROSTAT) 0.4 MG SL tablet ONE UNDER THE TONGUE EVERY 5 MINUTES AS NEEDED FOR CHEST PAIN. IF PAIN AFTER 3 PILLS, CALL 911 25 tablet 1  . ONETOUCH DELICA LANCETS 33G MISC Use to test blood sugars daily. Dx: E11.9 100 each 11  . pentosan polysulfate (ELMIRON) 100 MG capsule Take 100 mg by mouth 2 (two) times daily.     . potassium chloride (K-DUR,KLOR-CON) 10 MEQ tablet Take 1 tablet (10 mEq total) by mouth daily. 30 tablet 2  . pravastatin (PRAVACHOL) 40 MG tablet TAKE 1 TABLET DAILY 90 tablet 3  . losartan (COZAAR) 100 MG tablet Take 1 tablet (100 mg total) by mouth daily. 30 tablet 5   No facility-administered medications prior to visit.    Past Medical History:  Diagnosis Date  . Arthritis   . Atrial fibrillation (HCC)    a. Dx 09/2013.  . Bladder disorder   . Bradycardia 08/2012   a. Eval for symptomatic bradycardia 08/2012 - beta blocker discontinued.  [name]. BREAST MASS, BENIGN   . CAD (coronary  artery disease)    a. s/p PCI to RCA '01. b. repeat PCI to RCA '03 2/2 ISR. c. cath 2012: RCA dz, rx medically.  Occluded RCA.  Nonobstructive disease elsewhere  . Chronic combined systolic and diastolic CHF (congestive heart failure) (HCC)    a. EF 40-45% in 2014. b. EF reported to be normal in 03/2015 nuc.  . CKD (chronic kidney disease), stage III (HCC)   . DIVERTICULOSIS, COLON   . Epistaxis   . Esophageal reflux   . History of breast lump/mass excision 05/23/2009   Qualifier: History of  By: Lovell Sheehan MD, Balinda Quails   . HYPERLIPIDEMIA 03/07/2008  . HYPERTENSION 08/03/2007  . HYPOTHYROIDISM   . Internal hemorrhoids   . Interstitial cystitis   . LBBB (left bundle branch block)   .  Orthostatic hypotension 08/2012   a. 08/2012 w/ syncope - meds adjusted.   . Right hand fracture    Past Surgical History:  Procedure Laterality Date  . ABDOMINAL HYSTERECTOMY    . ANKLE RECONSTRUCTION     Left  . APPENDECTOMY    . BREAST SURGERY     benign mass  . CARDIOVERSION N/A 10/20/2013  . CARDIOVERSION N/A 04/24/2015   Procedure: CARDIOVERSION;  Surgeon: Lars Masson, MD;  [facility]: [facility] ENDOSCOPY;  Service: Cardiovascular;  Laterality: N/A;  . CATARACT EXTRACTION, BILATERAL    . CESAREAN SECTION    . CHOLECYSTECTOMY    . LEFT HEART CATH AND CORONARY ANGIOGRAPHY N/A 09/16/2017   Procedure: LEFT HEART CATH AND CORONARY ANGIOGRAPHY;  Surgeon: Swaziland, Peter M, MD;  Location: [facility] INVASIVE CV LAB;  Service: Cardiovascular;  Laterality: N/A;  . TONSILLECTOMY    . WRIST RECONSTRUCTION     Right   Social History   Socioeconomic History  . Marital status: Widowed    Spouse name: None  . Number of children: 3  . Years of education: None  . Highest education level: None  Social Needs  . Financial resource strain: None  . Food insecurity - worry: None  . Food insecurity - inability: None  . Transportation needs - medical: None  . Transportation needs - non-medical: None  Occupational History  . Occupation: retired  Tobacco Use  . Smoking status: Former Smoker    Packs/day: 1.00    Years: 15.00    Pack years: 15.00    Types: Cigarettes    Last attempt to quit: 11/10/1969    Years since quitting: 48.2  . Smokeless tobacco: Never Used  Substance and Sexual Activity  . Alcohol use: No    Alcohol/week: 0.0 oz  . Drug use: No  . Sexual activity: No    Birth control/protection: Post-menopausal  Other Topics Concern  . None  Social History Narrative   Widowed 05/2013. 2nd marriage. First husband died of cancer. 3 sons-[name]- 2 sons, 2 twin girls, [name]-1 son, no grandsons. [name]-1 son, 1 daughter, no grandkids.       LIves with youngest son (has cancer). Son  drives her but has license. Independent IADLs.       Retired from [employer] after 33 years.       Hobbies: crocheting, used to bowl with husband, reading      Living will: currently wants resuscitation.    Family History  Problem Relation Age of Onset  . Colon cancer Mother   . Coronary artery disease Mother   . Heart disease Mother   . Coronary artery disease Father   . Heart disease Father   .  Colon polyps Sister   . Coronary artery disease Sister   . Diabetes type II Son   . Coronary artery disease Brother         Review of Systems: Pertinent positive and negative review of systems were noted in the above HPI section. All other review of systems were otherwise negative.    Physical Exam: General: Well developed, well nourished, no acute distress Head: Normocephalic and atraumatic Eyes:  sclerae anicteric, EOMI Ears: Normal auditory acuity Mouth: No deformity or lesions Neck: Supple, no masses or thyromegaly Lungs: Clear throughout to auscultation Heart: Regular rate and rhythm; no murmurs, rubs or bruits Abdomen: Soft, non tender and non distended. No masses, hepatosplenomegaly or hernias noted. Normal Bowel sounds Rectal: not done Musculoskeletal: Symmetrical with no gross deformities  Skin: No lesions on visible extremities Pulses:  Normal pulses noted Extremities: No clubbing, cyanosis, edema or deformities noted Neurological: Alert oriented x 4, grossly nonfocal Cervical Nodes:  No significant cervical adenopathy Inguinal Nodes: No significant inguinal adenopathy Psychological:  Alert and cooperative. Normal mood and affect  Assessment and Recommendations:  1. GERD and dysphagia/odynphagia.  Follow standard antireflux measures.  Continue Prevacid 30 mg every morning.  Schedule EGD with possible dilation. The risks (including bleeding, perforation, infection, missed lesions, medication reactions and possible hospitalization or surgery if complications occur),  benefits, and alternatives to endoscopy with possible biopsy and possible dilation were discussed with the patient and they consent to proceed.   2. Hold Eliquis 2 days before procedure - will instruct when and how to resume after procedure. Low but real risk of cardiovascular event such as heart attack, stroke, embolism, thrombosis or ischemia/infarct of other organs off Eliquis explained and need to seek urgent help if this occurs. The patient consents to proceed. Will communicate by phone or EMR with patient's prescribing provider to confirm that holding Eliquis is reasonable in this case.   3. Family history of colon cancer in her mother at age 53.  The patient last underwent colonoscopy at age 16 with no polyps found.  She is no longer in a CRC screening program due to her age.    cc: Ardith Dark, MD [address] [place], [place] [address]

## 2018-01-27 NOTE — Telephone Encounter (Signed)
   Victoria HaltMary E Gomez 1925-08-24 [id number]    We have scheduled the above named patient for a(n) Upper Endoscopy possible dilation procedure. Our records show that (s)he is on anticoagulation therapy.  Please advise as to whether the patient may come off their therapy of Eliquis 2 days prior to their procedure which is scheduled for 02/18/18.  Please route your response to Christie NottinghamAmanda Hyland Mollenkopf, CMA   Sincerely,    [facility]

## 2018-01-28 NOTE — Telephone Encounter (Signed)
Patient with diagnosis of Afib on Eliquis for anticoagulation.    Procedure: Endoscopy Date of procedure: 02/18/18  CHADS2-VASc score of  7 (CHF, HTN, AGE, DM2, stroke/tia x 2, CAD, AGE, female)  CrCl 5247ml/min   Per office protocol, patient can hold Eliquis for 24 hours prior to procedure.

## 2018-01-28 NOTE — Telephone Encounter (Signed)
Left a message for patient to return my call. 

## 2018-01-28 NOTE — Telephone Encounter (Signed)
   Will route this to the Pharmacy Pool.  Theodore Demarkhonda Barrett, PA-C 01/28/2018 11:53 AM Beeper [phone]

## 2018-02-01 ENCOUNTER — Other Ambulatory Visit: Payer: Self-pay | Admitting: Family Medicine

## 2018-02-01 NOTE — Telephone Encounter (Signed)
OK for 24 hours with normal renal function

## 2018-02-01 NOTE — Telephone Encounter (Signed)
Dr. Russella DarStark, is a 24 hour hold on Eliquis ok?

## 2018-02-01 NOTE — Telephone Encounter (Signed)
Informed patient that she can hold Eliquis 24 hours prior to her procedure. Patient verbalized understanding.

## 2018-02-18 ENCOUNTER — Other Ambulatory Visit: Payer: Self-pay

## 2018-02-18 ENCOUNTER — Ambulatory Visit (AMBULATORY_SURGERY_CENTER): Payer: Medicare Other | Admitting: Gastroenterology

## 2018-02-18 ENCOUNTER — Encounter: Payer: Self-pay | Admitting: Gastroenterology

## 2018-02-18 VITALS — BP 139/67 | HR 68 | Temp 97.1°F | Resp 18 | Ht 63.0 in | Wt 157.0 lb

## 2018-02-18 DIAGNOSIS — K222 Esophageal obstruction: Secondary | ICD-10-CM

## 2018-02-18 DIAGNOSIS — K219 Gastro-esophageal reflux disease without esophagitis: Secondary | ICD-10-CM

## 2018-02-18 DIAGNOSIS — E119 Type 2 diabetes mellitus without complications: Secondary | ICD-10-CM | POA: Diagnosis not present

## 2018-02-18 DIAGNOSIS — R1310 Dysphagia, unspecified: Secondary | ICD-10-CM | POA: Diagnosis present

## 2018-02-18 DIAGNOSIS — I2510 Atherosclerotic heart disease of native coronary artery without angina pectoris: Secondary | ICD-10-CM | POA: Diagnosis not present

## 2018-02-18 DIAGNOSIS — I10 Essential (primary) hypertension: Secondary | ICD-10-CM | POA: Diagnosis not present

## 2018-02-18 DIAGNOSIS — E669 Obesity, unspecified: Secondary | ICD-10-CM | POA: Diagnosis not present

## 2018-02-18 DIAGNOSIS — I4891 Unspecified atrial fibrillation: Secondary | ICD-10-CM | POA: Diagnosis not present

## 2018-02-18 MED ORDER — SODIUM CHLORIDE 0.9 % IV SOLN: 500.0000 mL | Freq: Once | INTRAVENOUS | Status: DC

## 2018-02-18 NOTE — Op Note (Addendum)
[facility] Patient Name: Victoria Gomez Procedure Date: 02/18/2018 2:11 PM MRN: [id number] Endoscopist: Meryl Dare , MD Age: 84 Referring MD:  Date of Birth: 1925-07-04 Gender: Female Account #: [id number] Procedure:                Upper GI endoscopy Indications:              Dysphagia, Gastroesophageal reflux disease Medicines:                Monitored Anesthesia Care Procedure:                Pre-Anesthesia Assessment:                           - Prior to the procedure, a History and Physical                            was performed, and patient medications and                            allergies were reviewed. The patient's tolerance of                            previous anesthesia was also reviewed. The risks                            and benefits of the procedure and the sedation                            options and risks were discussed with the patient.                            All questions were answered, and informed consent                            was obtained. Prior Anticoagulants: The patient has                            taken Eliquis (apixaban), last dose was 2 days                            prior to procedure. ASA Grade Assessment: III - A                            patient with severe systemic disease. After                            reviewing the risks and benefits, the patient was                            deemed in satisfactory condition to undergo the                            procedure.  After obtaining informed consent, the endoscope was                            passed under direct vision. Throughout the                            procedure, the patient's blood pressure, pulse, and                            oxygen saturations were monitored continuously. The                            Endoscope was introduced through the mouth, and                            advanced to the second part of duodenum. The upper                             GI endoscopy was accomplished without difficulty.                            The patient tolerated the procedure well. Scope In: Scope Out: Findings:                 One benign-appearing, intrinsic mild stenosis was                            found at the gastroesophageal junction. This                            stenosis measured 1.3 cm (inner diameter). The                            stenosis was traversed. A guidewire was placed and                            the scope was withdrawn. Dilations were performed                            with Savary dilators with mild resistance at 14 mm,                            15 mm and 16 mm.                           The exam of the esophagus was otherwise normal.                           A small hiatal hernia was present.                           The exam of the stomach was otherwise normal.  The duodenal bulb and second portion of the                            duodenum were normal. Complications:            No immediate complications. Estimated Blood Loss:     Estimated blood loss: none. Impression:               - Benign-appearing esophageal stenosis. Dilated.                           - Small hiatal hernia.                           - Normal duodenal bulb and second portion of the                            duodenum.                           - No specimens collected. Recommendation:           - Patient has a contact number available for                            emergencies. The signs and symptoms of potential                            delayed complications were discussed with the                            patient. Return to normal activities tomorrow.                            Written discharge instructions were provided to the                            patient.                           - Clear liquid diet for 2 hours, then advance as                            tolerated to soft  diet today.                           - Continue present medications including Prevacid                            30 mg po qam long term.                           - Antireflux measures long term.                           - Return to GI office in 6 weeks.                           -  Resume Eliquis (apixaban) at prior dose tomorrow.                            Refer to managing physician for further adjustment                            of therapy. Meryl DareMalcolm T Stark, MD 02/18/2018 2:33:41 PM This report has been signed electronically.

## 2018-02-18 NOTE — Patient Instructions (Addendum)
YOU HAD AN ENDOSCOPIC PROCEDURE TODAY AT THE [facility]:   Refer to the procedure report that was given to you for any specific questions about what was found during the examination.  If the procedure report does not answer your questions, please call your gastroenterologist to clarify.  If you requested that your care partner not be given the details of your procedure findings, then the procedure report has been included in a sealed envelope for you to review at your convenience later.  YOU SHOULD EXPECT: Some feelings of bloating in the abdomen. Passage of more gas than usual.  Walking can help get rid of the air that was put into your GI tract during the procedure and reduce the bloating. If you had a lower endoscopy (such as a colonoscopy or flexible sigmoidoscopy) you may notice spotting of blood in your stool or on the toilet paper. If you underwent a bowel prep for your procedure, you may not have a normal bowel movement for a few days.  Please Note:  You might notice some irritation and congestion in your nose or some drainage.  This is from the oxygen used during your procedure.  There is no need for concern and it should clear up in a day or so.  SYMPTOMS TO REPORT IMMEDIATELY:    Following upper endoscopy (EGD)  Vomiting of blood or coffee ground material  New chest pain or pain under the shoulder blades  Painful or persistently difficult swallowing  New shortness of breath  Fever of 100F or higher  Black, tarry-looking stools  For urgent or emergent issues, a gastroenterologist can be reached at any hour by calling [phone].   DIET:  Clear liquid diet for 2 hours starting at 3:30pm, then soft diet for the rest of today starting at 5:30pm, but then you may proceed to your regular diet tomorrow. Drink plenty of fluids but you should avoid alcoholic beverages for 24 hours.  MEDICATIONS: Continue present medications including Prevacid 30 mg by mouth every morning  long term. Resume Eliquis (Apixaban) at prior dose tomorrow. Refer to managing physician for further adjustment of therapy.  Please see handouts given to you by your recovery nurse.  ACTIVITY:  You should plan to take it easy for the rest of today and you should NOT DRIVE or use heavy machinery until tomorrow (because of the sedation medicines used during the test).    FOLLOW UP: Our staff will call the number listed on your records the next business day following your procedure to check on you and address any questions or concerns that you may have regarding the information given to you following your procedure. If we do not reach you, we will leave a message.  However, if you are feeling well and you are not experiencing any problems, there is no need to return our call.  We will assume that you have returned to your regular daily activities without incident.  If any biopsies were taken you will be contacted by phone or by letter within the next 1-3 weeks.  Please call us at [phone] if you have not heard about the biopsies in 3 weeks.   Thank you for allowing us to provide for your healthcare needs today.  SIGNATURES/CONFIDENTIALITY: You and/or your care partner have signed paperwork which will be entered into your electronic medical record.  These signatures attest to the fact that that the information above on your After Visit Summary has been reviewed and is understood.  Full responsibility of the confidentiality of this discharge information lies with you and/or your care-partner.

## 2018-02-18 NOTE — Progress Notes (Signed)
Called to room to assist during endoscopic procedure.  Patient ID and intended procedure confirmed with present staff. Received instructions for my participation in the procedure from the performing physician.  

## 2018-02-18 NOTE — Progress Notes (Signed)
Report to PACU, RN, vss, BBS= Clear.  

## 2018-02-19 ENCOUNTER — Telehealth: Payer: Self-pay

## 2018-02-19 NOTE — Telephone Encounter (Signed)
  Follow up Call-  Call back number 02/18/2018  Post procedure Call Back phone  # [phone]  Permission to leave phone message Yes  Some recent data might be hidden     Patient questions:  Do you have a fever, pain , or abdominal swelling? No. Pain Score  0 *  Have you tolerated food without any problems? Yes.    Have you been able to return to your normal activities? Yes.    Do you have any questions about your discharge instructions: Diet   No. Medications  No. Follow up visit  No.  Do you have questions or concerns about your Care? No.  Actions: * If pain score is 4 or above: No action needed, pain <4.

## 2018-03-14 ENCOUNTER — Other Ambulatory Visit: Payer: Self-pay | Admitting: Family Medicine

## 2018-03-19 ENCOUNTER — Other Ambulatory Visit: Payer: Self-pay | Admitting: Cardiology

## 2018-03-19 NOTE — Telephone Encounter (Signed)
REFILL 

## 2018-03-24 ENCOUNTER — Encounter: Payer: Self-pay | Admitting: Cardiology

## 2018-03-24 NOTE — Progress Notes (Signed)
HPI The patient presents for followup up of atrial fibrillation, chronic recurrent diastolic and systolic HF and CAD.   The patient was admitted  09/13/17 with flash pulmonary edema and hypertension. After admission she eveloped CP with new anterolateral TWI and a mildly elevated troponin. Cath done 09/16/17 showed a CTO of her RCA with L-R collaterals, 45% LAD, and 70% OM1.  The plan is for medical Rx.  Her EF was 40 - 45%.    Since I last saw her she has had some chest discomfort.  She reports one episode at night where she had it lying in bed which she did not have her nitroglycerin.  She is described this before where she has had pain more at night and it was thought possibly to be reflux.  She is not describing any reproducible discomfort.  She does her household chores.  With this she does not bring on chest pressure, neck or arm discomfort.  She does not have palpitations, presyncope or syncope.  She does have some dizziness and back discomfort.  Allergies  Allergen Reactions  . Codeine Hives and Nausea And Vomiting  . Sulfonamide Derivatives Swelling    Turns red    Current Outpatient Medications  Medication Sig Dispense Refill  . acetaminophen (TYLENOL) 500 MG tablet Take 500 mg by mouth every 6 (six) hours as needed for mild pain. Reported on 12/12/2015    . albuterol (PROVENTIL HFA;VENTOLIN HFA) 108 (90 Base) MCG/ACT inhaler TAKE 2 PUFFS BY MOUTH EVERY 6 HOURS AS NEEDED FOR WHEEZE OR SHORTNESS OF BREATH 8.5 Inhaler 1  . apixaban (ELIQUIS) 2.5 MG TABS tablet Take 1 tablet (2.5 mg total) 2 (two) times daily by mouth. 180 tablet 1  . diclofenac sodium (VOLTAREN) 1 % GEL Apply 2 g topically 4 (four) times daily. 100 g 3  . furosemide (LASIX) 20 MG tablet TAKE 3 TABLETS BY MOUTH EVERY DAY 90 tablet 5  . glucose blood (ONETOUCH VERIO) test strip USE TO TEST BLOOD SUGARS DAILY. DX: E11.9 100 each 3  . isosorbide mononitrate (IMDUR) 60 MG 24 hr tablet TAKE 1 AND 1/2 TABLETS     (=90MG ) DAILY               *KREMERS URBAN* 135 tablet 3  . lansoprazole (PREVACID) 30 MG capsule TAKE 1 CAPSULE TWICE DAILY 180 capsule 1  . levothyroxine (SYNTHROID) 50 MCG tablet Take 1 tablet (50 mcg total) by mouth daily before breakfast. 90 tablet 3  . metoprolol tartrate (LOPRESSOR) 25 MG tablet TAKE 1/2 TABLET BY MOUTH AS NEEDED FOR PALPITATIONS 45 tablet 2  . nitroGLYCERIN (NITROSTAT) 0.4 MG SL tablet ONE UNDER THE TONGUE EVERY 5 MINUTES AS NEEDED FOR CHEST PAIN. IF PAIN AFTER 3 PILLS, CALL 911 25 tablet 1  . ONETOUCH DELICA LANCETS 33G MISC Use to test blood sugars daily. Dx: E11.9 100 each 11  . pentosan polysulfate (ELMIRON) 100 MG capsule Take 100 mg by mouth 2 (two) times daily.     . potassium chloride (K-DUR,KLOR-CON) 10 MEQ tablet Take 1 tablet (10 mEq total) by mouth daily. 30 tablet 2  . pravastatin (PRAVACHOL) 40 MG tablet TAKE 1 TABLET DAILY 90 tablet 3   Current Facility-Administered Medications  Medication Dose Route Frequency Provider Last Rate Last Dose  . 0.9 %  sodium chloride infusion  500 mL Intravenous Once Meryl Dare, MD        Past Medical History:  Diagnosis Date  . Arthritis   . Atrial fibrillation (  HCC)    a. Dx 09/2013.  . Bladder disorder   . Bradycardia 08/2012   a. Eval for symptomatic bradycardia 08/2012 - beta blocker discontinued.  [name] BREAST MASS, BENIGN   . CAD (coronary artery disease)    a. s/p PCI to RCA '01. b. repeat PCI to RCA '03 2/2 ISR. c. cath 2012: RCA dz, rx medically.  Occluded RCA.  Nonobstructive disease elsewhere  . Chronic combined systolic and diastolic CHF (congestive heart failure) (HCC)    a. EF 40-45% in 2014. b. EF reported to be normal in 03/2015 nuc.  . CKD (chronic kidney disease), stage III (HCC)   . DIVERTICULOSIS, COLON   . Epistaxis   . Esophageal reflux   . History of breast lump/mass excision 05/23/2009   Qualifier: History of  By: Lovell Sheehan MD, Balinda Quails   . HYPERLIPIDEMIA 03/07/2008  . HYPERTENSION 08/03/2007  .  HYPOTHYROIDISM   . Internal hemorrhoids   . Interstitial cystitis   . LBBB (left bundle branch block)   . Orthostatic hypotension 08/2012   a. 08/2012 w/ syncope - meds adjusted.   . Right hand fracture     Past Surgical History:  Procedure Laterality Date  . ABDOMINAL HYSTERECTOMY    . ANKLE RECONSTRUCTION     Left  . APPENDECTOMY    . BREAST SURGERY     benign mass  . CARDIOVERSION N/A 10/20/2013  . CARDIOVERSION N/A 04/24/2015   Procedure: CARDIOVERSION;  Surgeon: Lars Masson, MD;  Location: [facility] ENDOSCOPY;  Service: Cardiovascular;  Laterality: N/A;  . CATARACT EXTRACTION, BILATERAL    . CESAREAN SECTION    . CHOLECYSTECTOMY    . LEFT HEART CATH AND CORONARY ANGIOGRAPHY N/A 09/16/2017   Procedure: LEFT HEART CATH AND CORONARY ANGIOGRAPHY;  Surgeon: [place], Peter M, MD;  Location: [facility] INVASIVE CV LAB;  Service: Cardiovascular;  Laterality: N/A;  . TONSILLECTOMY    . WRIST RECONSTRUCTION     Right    ROS:  As stated in the HPI and negative for all other systems.  PHYSICAL EXAM BP 124/64 (BP Location: Left Arm, Patient Position: Sitting, Cuff Size: Normal)   Pulse 68   Ht  (1.6 m)   Wt 158 lb (71.7 kg)   BMI 27.99 kg/m   GENERAL:  Well appearing NECK:  No jugular venous distention, waveform within normal limits, carotid upstroke brisk and symmetric, no bruits, no thyromegaly LUNGS:  Clear to auscultation bilaterally CHEST:  Unremarkable HEART:  PMI not displaced or sustained,S1 and S2 within normal limits, no S3,  no clicks, no rubs, no murmurs, irregular ABD:  Flat, positive bowel sounds normal in frequency in pitch, no bruits, no rebound, no guarding, no midline pulsatile mass, no hepatomegaly, no splenomegaly EXT:  2 plus pulses throughout, no edema, no cyanosis no clubbing   EKG:  NA  03/25/2018  Lab Results  Component Value Date   CHOL 124 09/16/2017   TRIG 103 09/16/2017   HDL 39 (L) 09/16/2017   LDLCALC 64 09/16/2017   LDLDIRECT 65.0 06/26/2016      ASSESSMENT AND PLAN  ATRIAL FIB -   Ms. CHASITIE PASSEY has a CHA2DS2 - VASc score of 5 with a risk of stroke of 5%. She will continue meds as listed.   CORONARY ARTERY DISEASE -   She has disease as above and will be managed medically.  No change in therapy.   HYPERTENSION -  Her blood pressure fluctuates.  No change in therapy.  Diastolic HF (heart failure) -      She seems to be euvolemic.  No change in therapy.   CAROTID -   This was mild in 2016.   No further testing at this point.   Dyslipidemia -   No change in meds.  Labs a above.

## 2018-03-25 ENCOUNTER — Encounter: Payer: Self-pay | Admitting: Cardiology

## 2018-03-25 ENCOUNTER — Ambulatory Visit (INDEPENDENT_AMBULATORY_CARE_PROVIDER_SITE_OTHER): Payer: Medicare Other | Admitting: Cardiology

## 2018-03-25 VITALS — BP 124/64 | HR 68 | Ht 63.0 in | Wt 158.0 lb

## 2018-03-25 DIAGNOSIS — I25118 Atherosclerotic heart disease of native coronary artery with other forms of angina pectoris: Secondary | ICD-10-CM

## 2018-03-25 DIAGNOSIS — I10 Essential (primary) hypertension: Secondary | ICD-10-CM | POA: Diagnosis not present

## 2018-03-25 DIAGNOSIS — I5042 Chronic combined systolic (congestive) and diastolic (congestive) heart failure: Secondary | ICD-10-CM

## 2018-03-25 NOTE — Patient Instructions (Signed)

## 2018-03-26 ENCOUNTER — Other Ambulatory Visit: Payer: Self-pay | Admitting: Family Medicine

## 2018-03-28 ENCOUNTER — Other Ambulatory Visit: Payer: Self-pay | Admitting: Family Medicine

## 2018-04-03 ENCOUNTER — Other Ambulatory Visit: Payer: Self-pay | Admitting: Cardiology

## 2018-04-03 ENCOUNTER — Telehealth: Payer: Self-pay | Admitting: Cardiology

## 2018-04-03 NOTE — Telephone Encounter (Signed)
Pt running low on Imdur - called in 15 tabs of 60 mg and she will take 1.5 tabs =90 mg daily.  1 refill.

## 2018-04-13 ENCOUNTER — Other Ambulatory Visit: Payer: Self-pay | Admitting: Cardiology

## 2018-04-13 ENCOUNTER — Other Ambulatory Visit: Payer: Self-pay | Admitting: Family Medicine

## 2018-04-13 ENCOUNTER — Telehealth: Payer: Self-pay | Admitting: Cardiology

## 2018-04-13 MED ORDER — APIXABAN 2.5 MG PO TABS: 2.5000 mg | ORAL_TABLET | Freq: Two times a day (BID) | ORAL | 3 refills | Status: DC

## 2018-04-13 NOTE — Telephone Encounter (Signed)
New message    [facility] will not refill the eliquis , because they need a peer to peer with Dr Antoine PocheHochrein [phone]

## 2018-04-13 NOTE — Telephone Encounter (Signed)
Spoke with pt she stated she needed refills be send in to [facility] for Eliquis 2.5 mg . Rx has been sent to the pharmacy electronically.

## 2018-04-27 ENCOUNTER — Ambulatory Visit (INDEPENDENT_AMBULATORY_CARE_PROVIDER_SITE_OTHER): Payer: Medicare Other | Admitting: Family Medicine

## 2018-04-27 ENCOUNTER — Encounter: Payer: Self-pay | Admitting: Family Medicine

## 2018-04-27 VITALS — BP 112/80 | HR 69 | Temp 98.4°F | Ht 63.0 in | Wt 159.8 lb

## 2018-04-27 DIAGNOSIS — I482 Chronic atrial fibrillation: Secondary | ICD-10-CM

## 2018-04-27 DIAGNOSIS — I4821 Permanent atrial fibrillation: Secondary | ICD-10-CM

## 2018-04-27 DIAGNOSIS — I5042 Chronic combined systolic (congestive) and diastolic (congestive) heart failure: Secondary | ICD-10-CM

## 2018-04-27 DIAGNOSIS — I25118 Atherosclerotic heart disease of native coronary artery with other forms of angina pectoris: Secondary | ICD-10-CM | POA: Diagnosis not present

## 2018-04-27 DIAGNOSIS — N183 Chronic kidney disease, stage 3 unspecified: Secondary | ICD-10-CM

## 2018-04-27 DIAGNOSIS — E119 Type 2 diabetes mellitus without complications: Secondary | ICD-10-CM

## 2018-04-27 NOTE — Progress Notes (Signed)
Subjective:  Victoria Gomez is a 84 y.o. year old very pleasant female patient who presents for/with See problem oriented charting ROS- continued back pain. Occasional chest pain- uses nitroglycerin- stable volume of use. No increased swelling. No increased shortness of breath   Past Medical History-  Patient Active Problem List   Diagnosis Date Noted  . Diabetes mellitus II 09/22/2013    Priority: High  . Permanent atrial fibrillation (HCC) 09/21/2013    Priority: High  . H/O sinus bradycardia 08/10/2012    Priority: High  . Combined systolic and diastolic congestive heart failure (HCC) 11/17/2011    Priority: High  . CAD S/P percutaneous coronary angioplasty 08/03/2007    Priority: High  . Osteopenia 06/28/2015    Priority: Medium  . Vitamin B12 deficiency 07/28/2014    Priority: Medium  . CKD (chronic kidney disease), stage III (HCC) 09/22/2013    Priority: Medium  . LBBB (left bundle branch block) 09/22/2013    Priority: Medium  . Interstitial cystitis     Priority: Medium  . Hyperlipidemia 03/07/2008    Priority: Medium  . Hypothyroidism 08/03/2007    Priority: Medium  . Essential hypertension 08/03/2007    Priority: Medium  . Chronic anticoagulation 07/09/2017    Priority: Low  . Left groin hernia 06/25/2017    Priority: Low  . Internal hemorrhoids     Priority: Low  . Orthostatic hypotension 08/10/2012    Priority: Low  . Fatigue 04/20/2012    Priority: Low  . ARTHRITIS 12/30/2010    Priority: Low  . Left lumbar radiculopathy 12/11/2010    Priority: Low  . Esophageal reflux 10/18/2007    Priority: Low  . History of Barrett's esophagus 08/03/2007    Priority: Low    Medications- reviewed and updated Current Outpatient Medications  Medication Sig Dispense Refill  . acetaminophen (TYLENOL) 500 MG tablet Take 500 mg by mouth every 6 (six) hours as needed for mild pain. Reported on 12/12/2015    . albuterol (PROVENTIL HFA;VENTOLIN HFA) 108 (90 Base) MCG/ACT  inhaler TAKE 2 PUFFS BY MOUTH EVERY 6 HOURS AS NEEDED FOR WHEEZE OR SHORTNESS OF BREATH 8.5 Inhaler 1  . apixaban (ELIQUIS) 2.5 MG TABS tablet Take 1 tablet (2.5 mg total) by mouth 2 (two) times daily. 180 tablet 3  . diclofenac sodium (VOLTAREN) 1 % GEL Apply 2 g topically 4 (four) times daily. 100 g 3  . furosemide (LASIX) 20 MG tablet TAKE 3 TABLETS BY MOUTH EVERY DAY 90 tablet 4  . glucose blood (ONETOUCH VERIO) test strip USE TO TEST BLOOD SUGARS DAILY. DX: E11.9 100 each 3  . isosorbide mononitrate (IMDUR) 60 MG 24 hr tablet TAKE 1 AND 1/2 TABLETS     (=90MG ) DAILY              *KREMERS URBAN* 135 tablet 3  . KLOR-CON M10 10 MEQ tablet TAKE 1 TABLET BY MOUTH EVERY DAY 30 tablet 2  . lansoprazole (PREVACID) 30 MG capsule TAKE 1 CAPSULE TWICE DAILY 180 capsule 1  . levothyroxine (SYNTHROID) 50 MCG tablet Take 1 tablet (50 mcg total) by mouth daily before breakfast. 90 tablet 3  . metoprolol tartrate (LOPRESSOR) 25 MG tablet TAKE 1 TABLET BY MOUTH TWICE A DAY 180 tablet 3  . nitroGLYCERIN (NITROSTAT) 0.4 MG SL tablet ONE UNDER THE TONGUE EVERY 5 MINUTES AS NEEDED FOR CHEST PAIN. IF PAIN AFTER 3 PILLS, CALL 911 25 tablet 1  . ONETOUCH DELICA LANCETS 33G MISC Use to test  blood sugars daily. Dx: E11.9 100 each 11  . pentosan polysulfate (ELMIRON) 100 MG capsule Take 100 mg by mouth 2 (two) times daily.     . pravastatin (PRAVACHOL) 40 MG tablet TAKE 1 TABLET DAILY 90 tablet 3   Current Facility-Administered Medications  Medication Dose Route Frequency Provider Last Rate Last Dose  . 0.9 %  sodium chloride infusion  500 mL Intravenous Once Meryl Dare, MD        Objective: BP 112/80 (BP Location: Left Arm, Patient Position: Sitting, Cuff Size: Normal)   Pulse 69   Temp 98.4 F (36.9 C) (Oral)   Ht 5\' 3"  (1.6 m)   Wt 159 lb 12.8 oz (72.5 kg)   SpO2 97%   BMI 28.31 kg/m  Gen: NAD, resting comfortably TM normal bilaterally, oropharynx normal CV: irregularly irregular  no murmurs rubs  or gallops Lungs: CTAB no crackles, wheeze, rhonchi Abdomen: soft/nontender/nondistended/normal bowel sounds. No rebound or guarding.  Ext: no pitting edema Skin: warm, dry Neuro: CN II-XII intact, sensation and reflexes normal throughout, 5/5 muscle strength in bilateral upper and lower extremities. Normal finger to nose. Normal rapid alternating movements. No pronator drift. Normal gait.   Assessment/Plan:  Other notes: 1.blood pressure looks great- no change in therapy 2. TSH has been controlled- will check TSH next visit potentially 3. May return to Dr. Ethelene Hal for back  4. Occasional dizziness. Could be orthostatic. Reassuring vital signs. No falls.  No vertigo. Ears look normal. Cardiology wanted [place] to evaluate ears as they didn't think cardiac in nature. Reassuring neuro exam as well. Continue to monitor. Follow up sooner if worsening  Combined systolic and diastolic congestive heart failure (HCC) S: weight up 2 lbs from ferbruary. She is without edema. She is on lasix 60mg  daily- takes potassium along with this A/P: appears euvolemic- continue current rx  Permanent atrial fibrillation (HCC) S:  patient doing well on metoprolol 25mg  BID and eliquis 2.5mg  BID for anticoagulation A/P: no change in meds- appears to be in a fib  Diabetes mellitus II S: diet controlled Lab Results  Component Value Date   HGBA1C 6.5 12/28/2017   HGBA1C 6.3 (H) 09/13/2017   HGBA1C 6.4 06/25/2017   A/P: update a1c today.   CKD (chronic kidney disease), stage III (HCC) S: GFR has been in 35-50 range for most part- last visit actually was best it had been in some time and GFR over 60 A/P: update bmet today- if remains over 60 may change to CKD II if persists over a year   Future Appointments  Date Time Provider Department Center  05/17/2018  1:45 PM Meryl Dare, MD [facility]   Return in about 4 months (around 08/27/2018) for follow up- or sooner if needed.  Lab/Order  associations: Type 2 diabetes mellitus without complication, without long-term current use of insulin (HCC) - Plan: Hemoglobin A1c, Basic metabolic panel  CKD (chronic kidney disease), stage III (HCC) - Plan: Basic metabolic panel  Return precautions advised.  Tana Conch, MD

## 2018-04-27 NOTE — Assessment & Plan Note (Signed)
S: weight up 2 lbs from ferbruary. She is without edema. She is on lasix 60mg  daily- takes potassium along with this A/P: appears euvolemic- continue current rx

## 2018-04-27 NOTE — Assessment & Plan Note (Signed)
S: GFR has been in 35-50 range for most part- last visit actually was best it had been in some time and GFR over 60 A/P: update bmet today- if remains over 60 may change to CKD II if persists over a year

## 2018-04-27 NOTE — Patient Instructions (Addendum)
Health Maintenance Due  Topic Date Due  . OPHTHALMOLOGY EXAM - please get eye exam updated and have them send us a copy 02/05/2018   When you see me in 4 months- would be great if you could schedule a wellness visit with our nursing staff before you go  Please stop by lab before you go

## 2018-04-27 NOTE — Assessment & Plan Note (Signed)
S:  patient doing well on metoprolol 25mg  BID and eliquis 2.5mg  BID for anticoagulation A/P: no change in meds- appears to be in a fib

## 2018-04-27 NOTE — Assessment & Plan Note (Signed)
S: diet controlled Lab Results  Component Value Date   HGBA1C 6.5 12/28/2017   HGBA1C 6.3 (H) 09/13/2017   HGBA1C 6.4 06/25/2017   A/P: update a1c today.

## 2018-04-28 LAB — BASIC METABOLIC PANEL
BUN: 25 mg/dL — ABNORMAL HIGH (ref 6–23)
CALCIUM: 9.6 mg/dL (ref 8.4–10.5)
CO2: 28 meq/L (ref 19–32)
CREATININE: 1.06 mg/dL (ref 0.40–1.20)
Chloride: 106 mEq/L (ref 96–112)
GFR: 51.6 mL/min — ABNORMAL LOW (ref >60.00)
GLUCOSE: 96 mg/dL (ref 70–99)
Potassium: 4.4 mEq/L (ref 3.5–5.1)
SODIUM: 141 meq/L (ref 135–145)

## 2018-04-28 LAB — HEMOGLOBIN A1C: HEMOGLOBIN A1C: 6.5 % (ref 4.6–6.5)

## 2018-04-29 ENCOUNTER — Ambulatory Visit: Payer: Medicare Other | Admitting: *Deleted

## 2018-05-17 ENCOUNTER — Ambulatory Visit: Payer: Medicare Other | Admitting: Gastroenterology

## 2018-05-17 ENCOUNTER — Telehealth: Payer: Self-pay

## 2018-05-17 NOTE — Telephone Encounter (Signed)
Do you want to charge? 

## 2018-05-17 NOTE — Telephone Encounter (Signed)
No charge this time. 

## 2018-05-17 NOTE — Telephone Encounter (Signed)
-----   Message from Holli Humbleshristy D Harris sent at 05/17/2018  9:00 AM EDT ----- Pt canceled appt this afternoon bc she is sick

## 2018-05-28 IMAGING — CR DG CHEST 2V
2 series · 2 of 2 positions shown · non-contrast
Comparison: Single-view of the chest 05/10/2014 09/13/2017.

CLINICAL DATA: Cough for 2 weeks.

EXAM:
CHEST  2 VIEW

[chest pa]
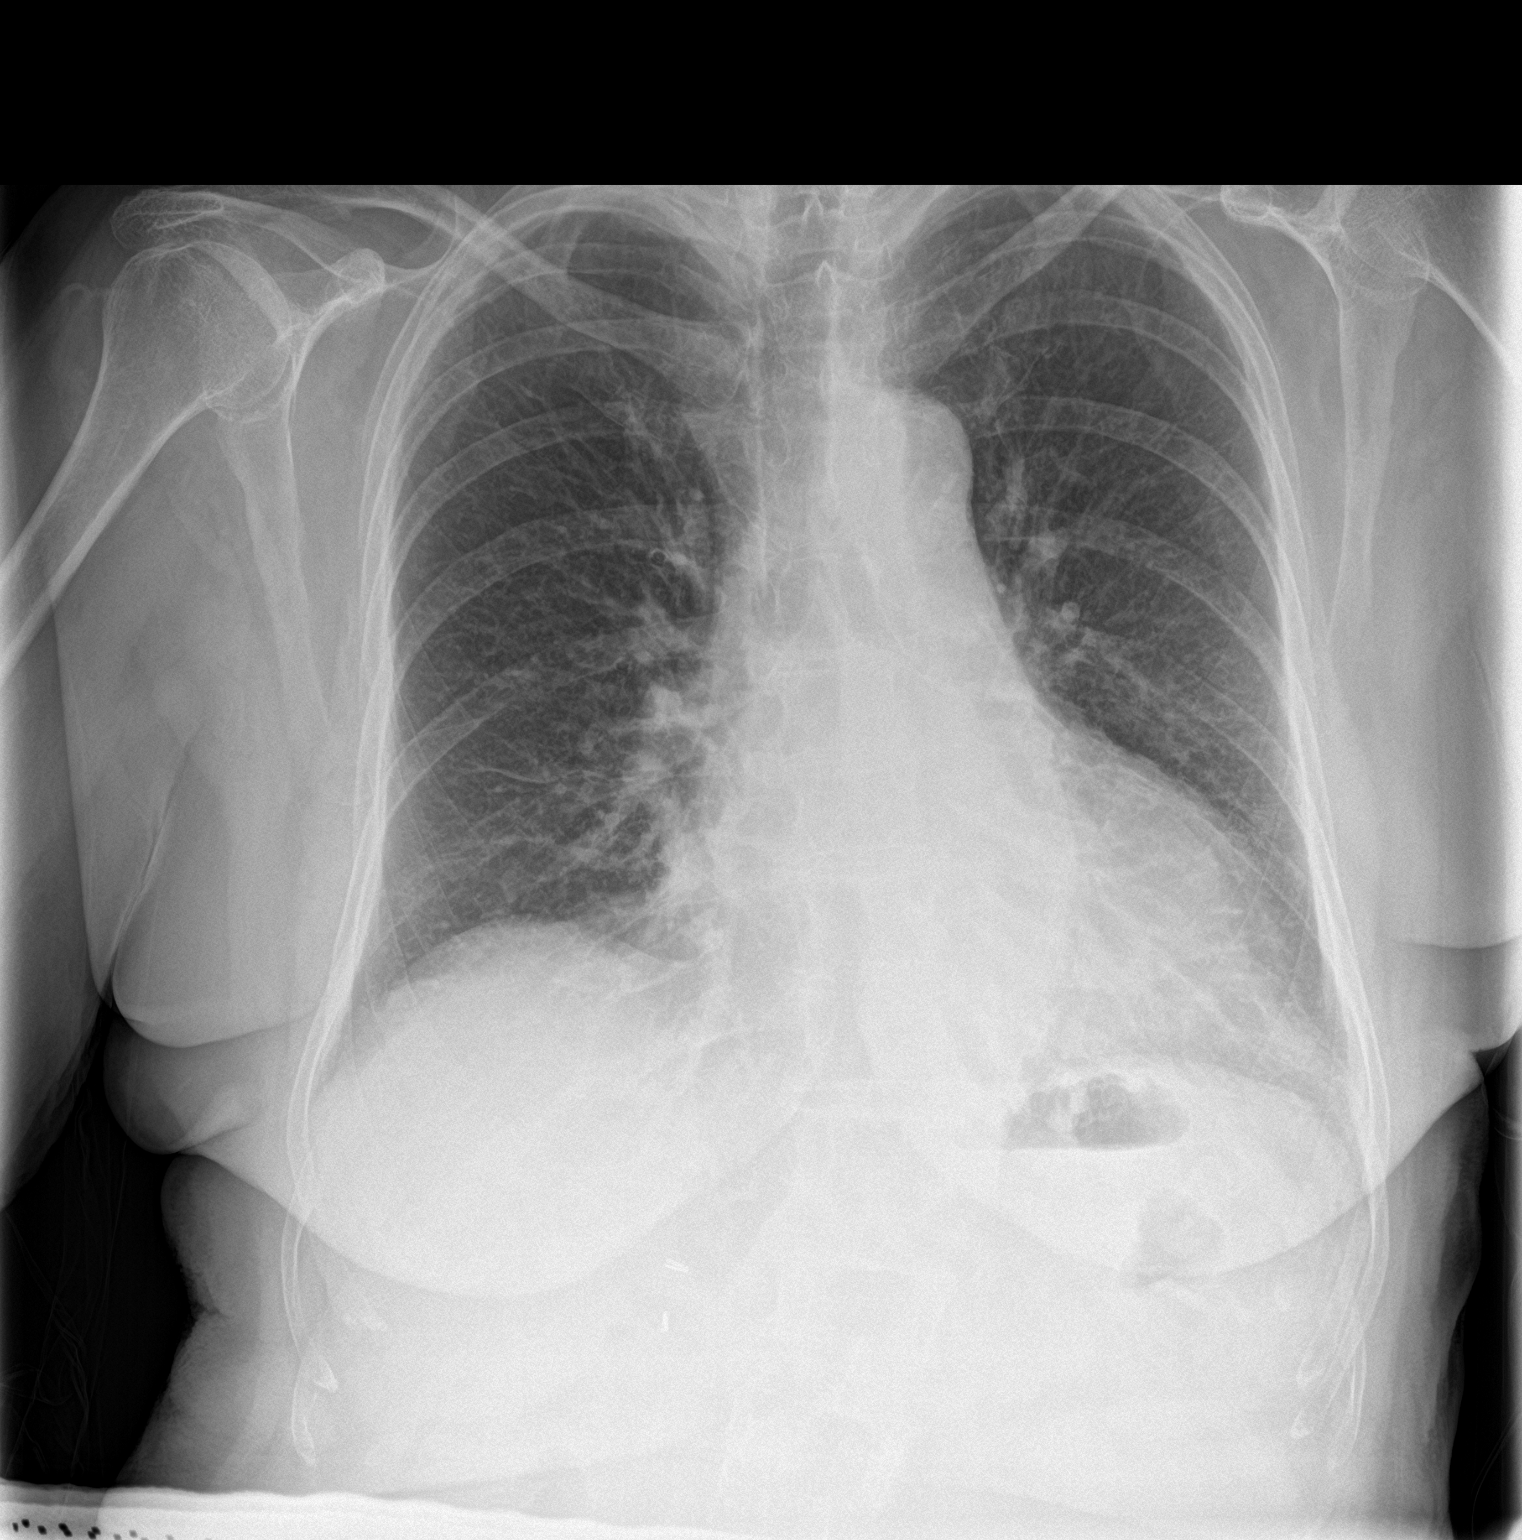

[chest lat]
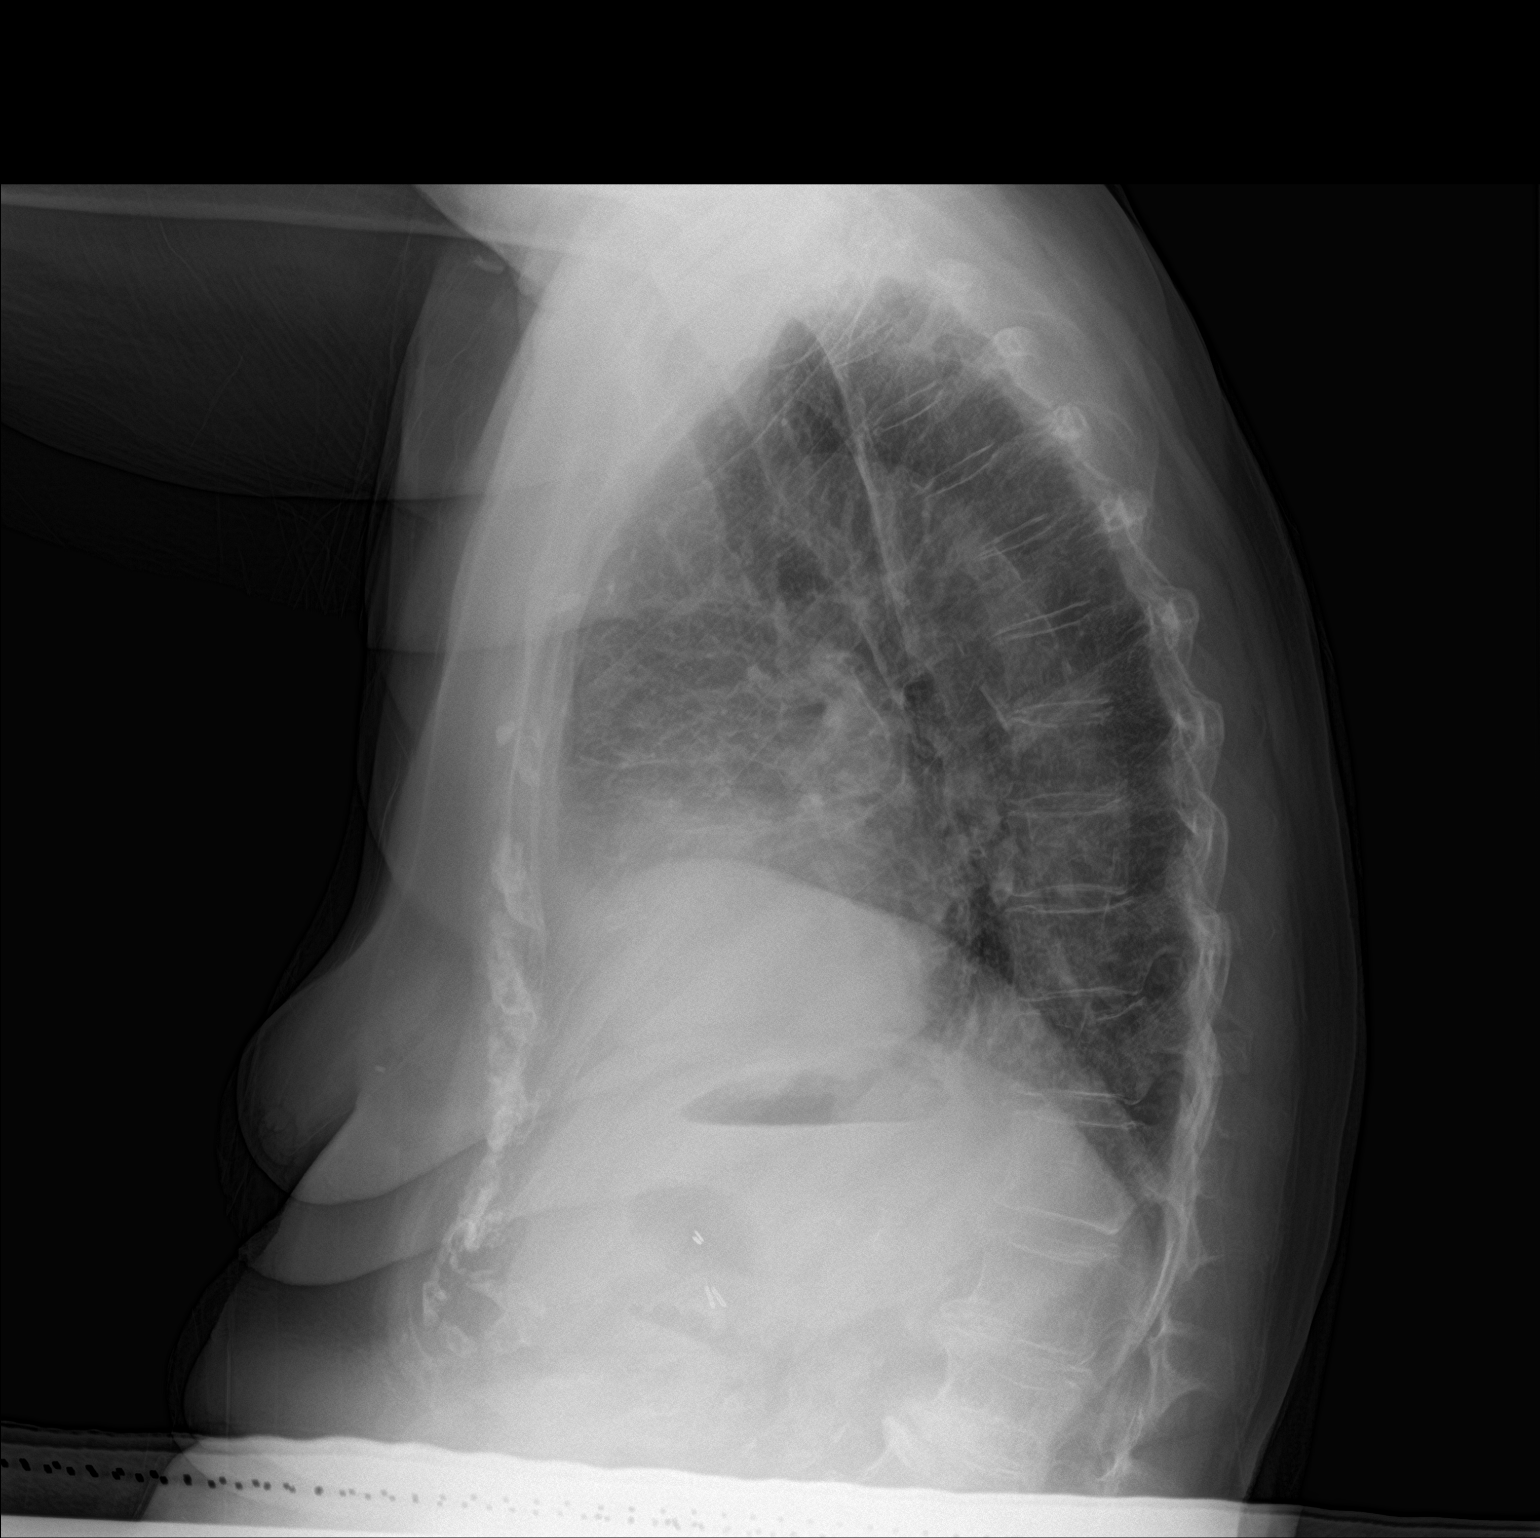

[2 of 2 positions shown; findings below may reference images not displayed]

FINDINGS: Lungs are clear. Heart size is upper normal. No pneumothorax or
pleural effusion. Aortic atherosclerosis noted. No acute bony
abnormality.
IMPRESSION: No acute disease.

## 2018-06-02 ENCOUNTER — Telehealth: Payer: Self-pay

## 2018-06-02 NOTE — Telephone Encounter (Signed)
-----   Message from Shelva MajesticStephen O Hunter, MD sent at 05/27/2018  8:32 AM EDT ----- Need last diabetic eye exam- she was supposed to schedule after last visit and have it sent to us- has she scheduled yet? Can they send us report? You can make this a phone note

## 2018-06-02 NOTE — Telephone Encounter (Signed)
Called and spoke with patient who stated she will call and schedule an exam. I asked her to make sure they send us a report

## 2018-06-04 DIAGNOSIS — H35033 Hypertensive retinopathy, bilateral: Secondary | ICD-10-CM | POA: Diagnosis not present

## 2018-06-04 DIAGNOSIS — H1013 Acute atopic conjunctivitis, bilateral: Secondary | ICD-10-CM | POA: Diagnosis not present

## 2018-06-04 DIAGNOSIS — H35371 Puckering of macula, right eye: Secondary | ICD-10-CM | POA: Diagnosis not present

## 2018-06-04 DIAGNOSIS — E119 Type 2 diabetes mellitus without complications: Secondary | ICD-10-CM | POA: Diagnosis not present

## 2018-06-04 LAB — HM DIABETES EYE EXAM

## 2018-06-08 ENCOUNTER — Encounter: Payer: Self-pay | Admitting: Family Medicine

## 2018-06-11 ENCOUNTER — Encounter: Payer: Self-pay | Admitting: Family Medicine

## 2018-06-14 ENCOUNTER — Other Ambulatory Visit: Payer: Self-pay | Admitting: Cardiology

## 2018-06-14 DIAGNOSIS — K2100 Gastro-esophageal reflux disease with esophagitis, without bleeding: Secondary | ICD-10-CM

## 2018-06-21 NOTE — Telephone Encounter (Signed)
Rx has been sent to the pharmacy electronically. ° °

## 2018-06-26 ENCOUNTER — Other Ambulatory Visit: Payer: Self-pay | Admitting: Family Medicine

## 2018-07-14 ENCOUNTER — Other Ambulatory Visit: Payer: Self-pay | Admitting: Family Medicine

## 2018-08-27 ENCOUNTER — Encounter: Payer: Self-pay | Admitting: Family Medicine

## 2018-08-27 ENCOUNTER — Ambulatory Visit (INDEPENDENT_AMBULATORY_CARE_PROVIDER_SITE_OTHER): Payer: Medicare Other | Admitting: Family Medicine

## 2018-08-27 VITALS — BP 138/72 | HR 64 | Temp 98.1°F | Ht 63.0 in | Wt 161.4 lb

## 2018-08-27 DIAGNOSIS — I25118 Atherosclerotic heart disease of native coronary artery with other forms of angina pectoris: Secondary | ICD-10-CM | POA: Diagnosis not present

## 2018-08-27 DIAGNOSIS — I5042 Chronic combined systolic (congestive) and diastolic (congestive) heart failure: Secondary | ICD-10-CM

## 2018-08-27 DIAGNOSIS — E785 Hyperlipidemia, unspecified: Secondary | ICD-10-CM

## 2018-08-27 DIAGNOSIS — R300 Dysuria: Secondary | ICD-10-CM

## 2018-08-27 DIAGNOSIS — E034 Atrophy of thyroid (acquired): Secondary | ICD-10-CM

## 2018-08-27 DIAGNOSIS — E663 Overweight: Secondary | ICD-10-CM | POA: Diagnosis not present

## 2018-08-27 DIAGNOSIS — N183 Chronic kidney disease, stage 3 unspecified: Secondary | ICD-10-CM

## 2018-08-27 DIAGNOSIS — I4821 Permanent atrial fibrillation: Secondary | ICD-10-CM | POA: Diagnosis not present

## 2018-08-27 DIAGNOSIS — I10 Essential (primary) hypertension: Secondary | ICD-10-CM

## 2018-08-27 DIAGNOSIS — E119 Type 2 diabetes mellitus without complications: Secondary | ICD-10-CM | POA: Diagnosis not present

## 2018-08-27 LAB — POC URINALSYSI DIPSTICK (AUTOMATED)
Bilirubin, UA: NEGATIVE
Glucose, UA: NEGATIVE
Ketones, UA: NEGATIVE
NITRITE UA: POSITIVE
PH UA: 5.5 (ref 5.0–8.0)
PROTEIN UA: NEGATIVE
Spec Grav, UA: 1.02 (ref 1.010–1.025)
UROBILINOGEN UA: 0.2 U/dL

## 2018-08-27 LAB — HEMOGLOBIN A1C: HEMOGLOBIN A1C: 6.3 % (ref 4.6–6.5)

## 2018-08-27 LAB — BASIC METABOLIC PANEL
BUN: 24 mg/dL — ABNORMAL HIGH (ref 6–23)
CALCIUM: 9.7 mg/dL (ref 8.4–10.5)
CO2: 30 mEq/L (ref 19–32)
Chloride: 104 mEq/L (ref 96–112)
Creatinine, Ser: 1.08 mg/dL (ref 0.40–1.20)
GFR: 50.46 mL/min — AB (ref >60.00)
GLUCOSE: 113 mg/dL — AB (ref 70–99)
Potassium: 3.7 mEq/L (ref 3.5–5.1)
SODIUM: 142 meq/L (ref 135–145)

## 2018-08-27 LAB — TSH: TSH: 1.1 u[IU]/mL (ref 0.35–4.50)

## 2018-08-27 MED ORDER — CEPHALEXIN 500 MG PO CAPS: 500.0000 mg | ORAL_CAPSULE | Freq: Two times a day (BID) | ORAL | 0 refills | Status: AC

## 2018-08-27 NOTE — Patient Instructions (Addendum)
Please stop by lab before you go  Treatment with Keflex 500 mg twice a day for urinary tract infection.  Follow-up with Victoria Gomez if symptoms worsen or fail to improve by early next week.  We are going to get a urine and a culture to make sure we have you on the right antibiotics  Before you see me in 4 months I would also like for you to sign up for an annual wellness visit with one of our nurses, Cassie or Darl Pikes, who both specialize in the annual wellness visit. This is a free benefit under medicare that may help Victoria Gomez find additional ways to help you. Some highlights are reviewing medications, lifestyle, and doing a dementia screen.

## 2018-08-27 NOTE — Progress Notes (Signed)
Subjective:  Victoria Gomez is a 84 y.o. year old very pleasant female patient who presents for/with See problem oriented charting ROS- chest pain about every 2 weeks- resolves with nitroglycerin.  Had one lower sugar at 78. Shortness of breath with steps. Stable edema. No recent weight gain.   Past Medical History-  Patient Active Problem List   Diagnosis Date Noted  . Diabetes mellitus II 09/22/2013    Priority: High  . Permanent atrial fibrillation 09/21/2013    Priority: High  . H/O sinus bradycardia 08/10/2012    Priority: High  . Combined systolic and diastolic congestive heart failure (HCC) 11/17/2011    Priority: High  . CAD S/P percutaneous coronary angioplasty 08/03/2007    Priority: High  . Osteopenia 06/28/2015    Priority: Medium  . Vitamin B12 deficiency 07/28/2014    Priority: Medium  . CKD (chronic kidney disease), stage III (HCC) 09/22/2013    Priority: Medium  . LBBB (left bundle branch block) 09/22/2013    Priority: Medium  . Interstitial cystitis     Priority: Medium  . Hyperlipidemia 03/07/2008    Priority: Medium  . Hypothyroidism 08/03/2007    Priority: Medium  . Essential hypertension 08/03/2007    Priority: Medium  . Chronic anticoagulation 07/09/2017    Priority: Low  . Left groin hernia 06/25/2017    Priority: Low  . Internal hemorrhoids     Priority: Low  . Orthostatic hypotension 08/10/2012    Priority: Low  . Fatigue 04/20/2012    Priority: Low  . ARTHRITIS 12/30/2010    Priority: Low  . Left lumbar radiculopathy 12/11/2010    Priority: Low  . Esophageal reflux 10/18/2007    Priority: Low  . History of Barrett's esophagus 08/03/2007    Priority: Low    Medications- reviewed and updated Current Outpatient Medications  Medication Sig Dispense Refill  . acetaminophen (TYLENOL) 500 MG tablet Take 500 mg by mouth every 6 (six) hours as needed for mild pain. Reported on 12/12/2015    . albuterol (PROVENTIL HFA;VENTOLIN HFA) 108 (90 Base)  MCG/ACT inhaler TAKE 2 PUFFS BY MOUTH EVERY 6 HOURS AS NEEDED FOR WHEEZE OR SHORTNESS OF BREATH 8.5 Inhaler 1  . apixaban (ELIQUIS) 2.5 MG TABS tablet Take 1 tablet (2.5 mg total) by mouth 2 (two) times daily. 180 tablet 3  . diclofenac sodium (VOLTAREN) 1 % GEL Apply 2 g topically 4 (four) times daily. 100 g 3  . furosemide (LASIX) 20 MG tablet TAKE 3 TABLETS BY MOUTH EVERY DAY 270 tablet 1  . glucose blood (ONETOUCH VERIO) test strip USE TO TEST BLOOD SUGARS DAILY. DX: E11.9 100 each 3  . isosorbide mononitrate (IMDUR) 60 MG 24 hr tablet TAKE 1 AND 1/2 TABLETS     (=90MG ) DAILY              *[employer]* 135 tablet 3  . KLOR-CON M10 10 MEQ tablet TAKE 1 TABLET BY MOUTH EVERY DAY 90 tablet 2  . lansoprazole (PREVACID) 30 MG capsule TAKE 1 CAPSULE TWICE DAILY 180 capsule 1  . levothyroxine (SYNTHROID) 50 MCG tablet Take 1 tablet (50 mcg total) by mouth daily before breakfast. 90 tablet 3  . metoprolol tartrate (LOPRESSOR) 25 MG tablet TAKE 1 TABLET BY MOUTH TWICE A DAY 180 tablet 3  . nitroGLYCERIN (NITROSTAT) 0.4 MG SL tablet ONE UNDER THE TONGUE EVERY 5 MINUTES AS NEEDED FOR CHEST PAIN. IF PAIN AFTER 3 PILLS, CALL 911 25 tablet 1  . [facility] DELICA  LANCETS 33G MISC Use to test blood sugars daily. Dx: E11.9 100 each 11  . pentosan polysulfate (ELMIRON) 100 MG capsule Take 100 mg by mouth 2 (two) times daily.     . pravastatin (PRAVACHOL) 40 MG tablet TAKE 1 TABLET DAILY 90 tablet 3   No current facility-administered medications for this visit.     Objective: BP 138/72 (BP Location: Left Arm, Patient Position: Sitting, Cuff Size: Large)   Pulse 64   Temp 98.1 F (36.7 C) (Oral)   Ht 5\' 3"  (1.6 m)   Wt 161 lb 6.4 oz (73.2 kg)   SpO2 96%   BMI 28.59 kg/m  Gen: NAD, resting comfortably CV: RRR no murmurs rubs or gallops Lungs: CTAB no crackles, wheeze, rhonchi Abdomen: soft/nontender/nondistended/normal bowel sounds. Ext: nonpitting edema Skin: warm, dry  Assessment/Plan:  Concern  for UTI S: Patients symptoms started a week ago.  Complains of dysuria: yes; polyuria: yes; nocturia: yes; urgency: yes.  Symptoms are getting worse- she has had some incontinence. Some chills.  Also with sinus congestion for over a week ROS- no fever, nausea, vomiting. some flank pain- chronic. No blood in urine.  A/P: UA will obtain today. Likely UTI. Will get culture. Empiric treatment with: keflex 500 BID (GFR under 60 so go to BID instead of TID or QID) Patient to follow up if new or worsening symptoms or failure to improve. Keflex may help sinuses as well.   Diabetes mellitus II S: diet controlled Lab Results  Component Value Date   HGBA1C 6.3 08/27/2018   HGBA1C 6.5 04/27/2018   HGBA1C 6.5 12/28/2017   A/P: stable- continue without rx    Combined systolic and diastolic congestive heart failure (HCC) S: compliant with lasix 60mg  daily. Weight up 2 pounds from last visit. No increased edema A/P: appears euvolemic- continue current rx    Hyperlipidemia S:  controlled on pravastatin 40mg  with last LDL at 64  A/P: about a month too early for full lipid repeat- check with next labs   CAD S/P percutaneous coronary angioplasty S: stable angina with Nitroglycerin- every 2 weeks in addition to baseline imdur 90mg . Compliant with pravastatin. She is on eliquis alone instead of aspirin and eliquis - likely due to increased fall risk at her age.  A/P: stable - continue current rx  Permanent atrial fibrillation (HCC) Stable. Continue Eliquis for anticoagulation. Metoprolol 25 mg BID for rate control.   CKD (chronic kidney disease), stage III (HCC) GFR stable in low 50s on labs today. Had been on losartan in case proteinuric element. See hypertension section about this- no longer on med list.   Essential hypertension Controlled on imdur 90mg , metoprolol 25 mg BID, lasix 60mg  daily.  Appears GI removed Losartan 100 mg from her med list 01/27/18 for unclear reasons. I noted this after  visit- will have team reach out to figure out if patient is on losartan. If she is not- I will ask them to send this in and restart it since BP high normal and she seemed to tolerate that medication.    Hypothyroidism Lab Results  Component Value Date   TSH 1.10 08/27/2018  at goal on levothyroxine 50 mcg  Overweight- at her age would encourage to maintain current weight though.   Future Appointments  Date Time Provider Department Center  09/23/2018  3:00 PM Rollene Rotunda, MD [facility]  12/28/2018  1:20 PM Shelva Majestic, MD [facility]   Return in about 4 months (around 12/28/2018) for follow up-  or sooner if needed.  Lab/Order associations: Dysuria - Plan: POCT Urinalysis Dipstick (Automated), Urine Culture  Type 2 diabetes mellitus without complication, without long-term current use of insulin (HCC) - Plan: Hemoglobin A1c, Basic metabolic panel  Chronic combined systolic and diastolic congestive heart failure (HCC)  Coronary artery disease of native artery of native heart with stable angina pectoris (HCC)  CKD (chronic kidney disease), stage III (HCC)  Hypothyroidism due to acquired atrophy of thyroid - Plan: TSH  Hyperlipidemia, unspecified hyperlipidemia type  Permanent atrial fibrillation  Essential hypertension  Meds ordered this encounter  Medications  . cephALEXin (KEFLEX) 500 MG capsule    Sig: Take 1 capsule (500 mg total) by mouth 2 (two) times daily for 7 days.    Dispense:  14 capsule    Refill:  0   Return precautions advised.  Tana Conch, MD

## 2018-08-28 NOTE — Assessment & Plan Note (Signed)
Stable. Continue Eliquis for anticoagulation. Metoprolol 25 mg BID for rate control.

## 2018-08-28 NOTE — Assessment & Plan Note (Signed)
S: compliant with lasix 60mg  daily. Weight up 2 pounds from last visit. No increased edema A/P: appears euvolemic- continue current rx

## 2018-08-28 NOTE — Assessment & Plan Note (Signed)
GFR stable in low 50s on labs today. Had been on losartan in case proteinuric element. See hypertension section about this- no longer on med list.

## 2018-08-28 NOTE — Assessment & Plan Note (Signed)
S: stable angina with Nitroglycerin- every 2 weeks in addition to baseline imdur 90mg . Compliant with pravastatin. She is on eliquis alone instead of aspirin and eliquis - likely due to increased fall risk at her age.  A/P: stable - continue current rx

## 2018-08-28 NOTE — Assessment & Plan Note (Signed)
Controlled on imdur 90mg , metoprolol 25 mg BID, lasix 60mg  daily.  Appears GI removed Losartan 100 mg from her med list 01/27/18 for unclear reasons. I noted this after visit- will have team reach out to figure out if patient is on losartan. If she is not- I will ask them to send this in and restart it since BP high normal and she seemed to tolerate that medication.

## 2018-08-28 NOTE — Assessment & Plan Note (Signed)
Lab Results  Component Value Date   TSH 1.10 08/27/2018  at goal on levothyroxine 50 mcg

## 2018-08-28 NOTE — Assessment & Plan Note (Signed)
S:  controlled on pravastatin 40mg  with last LDL at 64  A/P: about a month too early for full lipid repeat- check with next labs

## 2018-08-28 NOTE — Assessment & Plan Note (Signed)
S: diet controlled Lab Results  Component Value Date   HGBA1C 6.3 08/27/2018   HGBA1C 6.5 04/27/2018   HGBA1C 6.5 12/28/2017   A/P: stable- continue without rx

## 2018-08-29 LAB — URINE CULTURE
MICRO NUMBER: 91255628
SPECIMEN QUALITY:: ADEQUATE

## 2018-08-31 ENCOUNTER — Telehealth: Payer: Self-pay

## 2018-08-31 MED ORDER — LOSARTAN POTASSIUM 100 MG PO TABS: 100.0000 mg | ORAL_TABLET | Freq: Every day | ORAL | 3 refills | Status: DC

## 2018-08-31 NOTE — Telephone Encounter (Signed)
Called and spoke with patient who confirmed she is not on losartan. I advised her that I will be sending losartan in to the [facility] and she verbalized understanding to restart it.

## 2018-08-31 NOTE — Telephone Encounter (Signed)
-----   Message from Shelva Majestic, MD sent at 08/28/2018 12:43 PM EDT ----- Silvestre Gunner GI removed Losartan 100 mg from her med list 01/27/18 for unclear reasons. I noted this after visit- will have team reach out to figure out if patient is on losartan. If she is not- I will ask them to send this in and restart it since BP high normal and she seemed to tolerate that medication.

## 2018-09-09 ENCOUNTER — Other Ambulatory Visit: Payer: Self-pay | Admitting: Family Medicine

## 2018-09-23 ENCOUNTER — Ambulatory Visit: Payer: Medicare Other | Admitting: Cardiology

## 2018-09-26 NOTE — Progress Notes (Signed)
HPI The patient presents for followup up of atrial fibrillation, chronic recurrent diastolic and systolic HF and CAD.   The patient was admitted  09/13/17 with flash pulmonary edema and hypertension. After admission she developed CP with new anterolateral TWI and a mildly elevated troponin. Cath done 09/16/17 showed a CTO of her RCA with L-R collaterals, 45% LAD, and 70% OM1.  The plan is for medical Rx.  Her EF was 40 - 45%.   Since I last saw her she has done well other than some nighttime shortness of breath and occasional discomfort.  She says she occasionally has to sit up and cough up some white phlegm or sputum and have a little shortness of breath but she says this is rare.  She otherwise can lie flat and is not otherwise describing PND or orthopnea.  She occasionally has some chest discomfort and sporadically takes a nitroglycerin but this does not seem like it is an increased pattern necessarily.  She has not taken any of this in a little while.  He does have any presyncope or syncope.  She gets around and does all of her housework.  He brought me a red velvet cake.  She also brought some potato peanut butter candy.   Allergies  Allergen Reactions  . Codeine Hives and Nausea And Vomiting  . Sulfonamide Derivatives Swelling    Turns red    Current Outpatient Medications  Medication Sig Dispense Refill  . acetaminophen (TYLENOL) 500 MG tablet Take 500 mg by mouth every 6 (six) hours as needed for mild pain. Reported on 12/12/2015    . albuterol (PROVENTIL HFA;VENTOLIN HFA) 108 (90 Base) MCG/ACT inhaler TAKE 2 PUFFS BY MOUTH EVERY 6 HOURS AS NEEDED FOR WHEEZE OR SHORTNESS OF BREATH 8.5 Inhaler 1  . apixaban (ELIQUIS) 2.5 MG TABS tablet Take 1 tablet (2.5 mg total) by mouth 2 (two) times daily. 180 tablet 3  . diclofenac sodium (VOLTAREN) 1 % GEL Apply 2 g topically 4 (four) times daily. 100 g 3  . furosemide (LASIX) 20 MG tablet TAKE 3 TABLETS BY MOUTH EVERY DAY 270 tablet 1  . glucose  blood (ONETOUCH VERIO) test strip USE TO TEST BLOOD SUGARS DAILY. DX: E11.9 100 each 3  . isosorbide mononitrate (IMDUR) 120 MG 24 hr tablet Take 1 tablet (120 mg total) by mouth daily. 90 tablet 3  . KLOR-CON M10 10 MEQ tablet TAKE 1 TABLET BY MOUTH EVERY DAY 90 tablet 2  . lansoprazole (PREVACID) 30 MG capsule TAKE 1 CAPSULE TWICE DAILY 180 capsule 1  . levothyroxine (SYNTHROID) 50 MCG tablet Take 1 tablet (50 mcg total) by mouth daily before breakfast. 90 tablet 3  . losartan (COZAAR) 100 MG tablet Take 1 tablet (100 mg total) by mouth daily. 90 tablet 3  . metoprolol tartrate (LOPRESSOR) 25 MG tablet TAKE 1 TABLET BY MOUTH TWICE A DAY 180 tablet 3  . nitroGLYCERIN (NITROSTAT) 0.4 MG SL tablet ONE UNDER THE TONGUE EVERY 5 MINUTES AS NEEDED FOR CHEST PAIN. IF PAIN AFTER 3 PILLS, CALL 911 25 tablet 1  . ONETOUCH DELICA LANCETS 33G MISC Use to test blood sugars daily. Dx: E11.9 100 each 11  . pentosan polysulfate (ELMIRON) 100 MG capsule Take 100 mg by mouth 2 (two) times daily.     . pravastatin (PRAVACHOL) 40 MG tablet TAKE 1 TABLET DAILY 90 tablet 3   No current facility-administered medications for this visit.     Past Medical History:  Diagnosis Date  .  Arthritis   . Atrial fibrillation (HCC)    a. Dx 09/2013.  . Bladder disorder   . Bradycardia 08/2012   a. Eval for symptomatic bradycardia 08/2012 - beta blocker discontinued.  [name] BREAST MASS, BENIGN   . CAD (coronary artery disease)    a. s/p PCI to RCA '01. b. repeat PCI to RCA '03 2/2 ISR. c. cath 2012: RCA dz, rx medically.  Occluded RCA.  Nonobstructive disease elsewhere  . Chronic combined systolic and diastolic CHF (congestive heart failure) (HCC)    a. EF 40-45% in 2014. b. EF reported to be normal in 03/2015 nuc.  . CKD (chronic kidney disease), stage III (HCC)   . DIVERTICULOSIS, COLON   . Epistaxis   . Esophageal reflux   . History of breast lump/mass excision 05/23/2009   Qualifier: History of  By: Lovell Sheehan MD, Balinda Quails     . HYPERLIPIDEMIA 03/07/2008  . HYPERTENSION 08/03/2007  . HYPOTHYROIDISM   . Internal hemorrhoids   . Interstitial cystitis   . LBBB (left bundle branch block)   . Orthostatic hypotension 08/2012   a. 08/2012 w/ syncope - meds adjusted.   . Right hand fracture     Past Surgical History:  Procedure Laterality Date  . ABDOMINAL HYSTERECTOMY    . ANKLE RECONSTRUCTION     Left  . APPENDECTOMY    . BREAST SURGERY     benign mass  . CARDIOVERSION N/A 10/20/2013  . CARDIOVERSION N/A 04/24/2015   Procedure: CARDIOVERSION;  Surgeon: Lars Masson, MD;  Location: [facility] ENDOSCOPY;  Service: Cardiovascular;  Laterality: N/A;  . CATARACT EXTRACTION, BILATERAL    . CESAREAN SECTION    . CHOLECYSTECTOMY    . LEFT HEART CATH AND CORONARY ANGIOGRAPHY N/A 09/16/2017   Procedure: LEFT HEART CATH AND CORONARY ANGIOGRAPHY;  Surgeon: [place], Peter M, MD;  Location[facility] INVASIVE CV LAB;  Service: Cardiovascular;  Laterality: N/A;  . TONSILLECTOMY    . WRIST RECONSTRUCTION     Right    ROS:  As stated in the HPI and negative for all other systems.  PHYSICAL EXAM BP 120/62   Pulse 79   Ht 5\' 3"  (1.6 m)   Wt 162 lb 6.4 oz (73.7 kg)   BMI 28.77 kg/m   GENERAL:  Well appearing NECK:  No jugular venous distention, waveform within normal limits, carotid upstroke brisk and symmetric, no bruits, no thyromegaly LUNGS:  Clear to auscultation bilaterally CHEST:  Unremarkable HEART:  PMI not displaced or sustained,S1 and S2 within normal limits, no S3,  no clicks, no rubs, no murmurs, irregular ABD:  Flat, positive bowel sounds normal in frequency in pitch, no bruits, no rebound, no guarding, no midline pulsatile mass, no hepatomegaly, no splenomegaly EXT:  2 plus pulses throughout, no edema, no cyanosis no clubbing   EKG:   Atrial fibrillation, rate 79, premature ectopic complexes, left bundle branch block, no acute ST-T wave changes.   09/28/2018  Lab Results  Component Value Date   CHOL 124  09/16/2017   TRIG 103 09/16/2017   HDL 39 (L) 09/16/2017   LDLCALC 64 09/16/2017   LDLDIRECT 65.0 06/26/2016     ASSESSMENT AND PLAN  ATRIAL FIB -   Ms. NYESHA CLIFF has a CHA2DS2 - VASc score of 5 with a risk of stroke of 5%.  She tolerates medications as listed.  No change in therapy.   CORONARY ARTERY DISEASE -     She does have some chest discomfort that might  be GI.  She has coronary disease as above.  And can continue to manage this medically.  I would like to increase her Imdur to 120 mg daily.  HYPERTENSION -  Her blood pressure fluctuates.  Today is well controlled.  I will continue the meds as listed.  Diastolic HF (heart failure) -      She seems to be euvolemic.  No change in therapy.  CAROTID -   This was mild in 2016.   No further testing is indicated.  Dyslipidemia -   LDL was at target.  No change in therapy.  However, her labs are a year old.  She is getting lab work probably about 3 months and she will go fasting and get a lipid profile.  For now she will continue the meds as listed.  No change in meds.  Labs a above.

## 2018-09-28 ENCOUNTER — Ambulatory Visit (INDEPENDENT_AMBULATORY_CARE_PROVIDER_SITE_OTHER): Payer: Medicare Other | Admitting: Cardiology

## 2018-09-28 ENCOUNTER — Encounter: Payer: Self-pay | Admitting: Cardiology

## 2018-09-28 ENCOUNTER — Other Ambulatory Visit: Payer: Self-pay

## 2018-09-28 VITALS — BP 120/62 | HR 79 | Ht 63.0 in | Wt 162.4 lb

## 2018-09-28 DIAGNOSIS — E785 Hyperlipidemia, unspecified: Secondary | ICD-10-CM | POA: Diagnosis not present

## 2018-09-28 DIAGNOSIS — I25119 Atherosclerotic heart disease of native coronary artery with unspecified angina pectoris: Secondary | ICD-10-CM

## 2018-09-28 DIAGNOSIS — I10 Essential (primary) hypertension: Secondary | ICD-10-CM

## 2018-09-28 DIAGNOSIS — I4821 Permanent atrial fibrillation: Secondary | ICD-10-CM | POA: Diagnosis not present

## 2018-09-28 DIAGNOSIS — I5032 Chronic diastolic (congestive) heart failure: Secondary | ICD-10-CM | POA: Diagnosis not present

## 2018-09-28 MED ORDER — ISOSORBIDE MONONITRATE ER 120 MG PO TB24: 120.0000 mg | ORAL_TABLET | Freq: Every day | ORAL | 3 refills | Status: DC

## 2018-09-28 NOTE — Patient Instructions (Addendum)
Medication Instructions:  INCREASE- Isosorbide 120 mg daily  If you need a refill on your cardiac medications before your next appointment, please call your pharmacy.  Labwork: None Ordered  If you have labs (blood work) drawn today and your tests are completely normal, you will receive your results only by: [name]. MyChart Message (if you have MyChart) OR . A paper copy in the mail If you have any lab test that is abnormal or we need to change your treatment, we will call you to review the results.  Testing/Procedures: None Ordered  Follow-Up: You will need a follow up appointment in 6 Months.  Please call our office 2 months in [phone]) to schedule the (6 Months) appointment.  You may see  DR Antoine PocheHochrein, or one of the following Advanced Practice Providers on your designated Care Team:    . Joni ReiningKathryn Lawrence, DNP, ANP . Rhonda Barrett, PA-C  . Corine ShelterLuke Kilroy, [place]PA-C  . Azalee CourseHao Meng, PA-C . Micah FlesherAngela Duke, PA-C  At [facility], you and your health needs are our priority.  As part of our continuing mission to provide you with exceptional heart care, we have created designated Provider Care Teams.  These Care Teams include your primary Cardiologist (physician) and Advanced Practice Providers (APPs -  Physician Assistants and Nurse Practitioners) who all work together to provide you with the care you need, when you need it.   Thank you for choosing [facility] at [facility]!!

## 2018-10-05 ENCOUNTER — Ambulatory Visit: Payer: Self-pay

## 2018-10-05 ENCOUNTER — Telehealth: Payer: Self-pay | Admitting: Cardiology

## 2018-10-05 NOTE — Telephone Encounter (Addendum)
Patient called in with c/o "dizziness." She says "I went to the cardiologist last Tuesday and he put me on Imdur 120 mg. Dr. Durene CalHunter had put me on a BP medicine Losartan 100 mg. Well, Saturday I woke up and couldn't see that good and was dizzy. I checked my BP and it was low, 100/55. I didn't take the Losartan, but I took the Imdur. For two days, I left off the Losartan. Today my BP was 110/66, I'm still dizzy. I ate breakfast, took all my medications minus the BP pill Losartan. I checked my BP later and it was 112/63. I'm still dizzy now, mainly when I lay down." I asked about other symptoms, she denies. According to protocol, see PCP within 24 hours. Patient says "I won't be able to come tomorrow, because I have company coming for Thanksgiving." I asked about today with another provider, since no availability today with Dr. Durene CalHunter. She says "no, I have too much to do today getting ready for tomorrow." I advised to call her cardiologist to let him know about her BP dropping and dizziness since starting on Imdur. She says "can't I just stop taking it?" I advised I cannot give that advice to stop medication, but she should call the cardiologist, since that's who put her on the Imdur, she verbalized understanding and asked about her BP medication. I advised I will send this over to Dr. Durene CalHunter for review and someone from the office will call with his recommendation, she verbalized understanding.  Reason for Disposition . [1] MODERATE dizziness (e.g., interferes with normal activities) AND [2] has NOT been evaluated by physician for this  (Exception: dizziness caused by heat exposure, sudden standing, or poor fluid intake)  Answer Assessment - Initial Assessment Questions 1. DESCRIPTION: "Describe your dizziness."     Head feels like it's going around, especially lying down 2. LIGHTHEADED: "Do you feel lightheaded?" (e.g., somewhat faint, woozy, weak upon standing)     Yes 3. VERTIGO: "Do you feel like either  you or the room is spinning or tilting?" (i.e. vertigo)     No 4. SEVERITY: "How bad is it?"  "Do you feel like you are going to faint?" "Can you stand and walk?"   - MILD - walking normally   - MODERATE - interferes with normal activities (e.g., work, school)    - SEVERE - unable to stand, requires support to walk, feels like passing out now.      Moderate 5. ONSET:  "When did the dizziness begin?"     Saturday 6. AGGRAVATING FACTORS: "Does anything make it worse?" (e.g., standing, change in head position)     Lying down it bothers me 7. HEART RATE: "Can you tell me your heart rate?" "How many beats in 15 seconds?"  (Note: not all patients can do this)       68 8. CAUSE: "What do you think is causing the dizziness?"     BP medications dropping BP too low 9. RECURRENT SYMPTOM: "Have you had dizziness before?" If so, ask: "When was the last time?" "What happened that time?"     Yes; off an on for a good while 10. OTHER SYMPTOMS: "Do you have any other symptoms?" (e.g., fever, chest pain, vomiting, diarrhea, bleeding)       No 11. PREGNANCY: "Is there any chance you are pregnant?" "When was your last menstrual period?"       No  Protocols used: DIZZINESS [facility]- LIGHTHEADEDNESS-A-AH

## 2018-10-05 NOTE — Telephone Encounter (Signed)
Called and spoke to patient and she stated she called and spoke to Cardiology. They advised her to stop her medication. She is to call them if her symptoms worsen

## 2018-10-05 NOTE — Telephone Encounter (Signed)
Thanks for update- glad she was able to speak with cardiology

## 2018-10-05 NOTE — Telephone Encounter (Signed)
Pt called to report that she has been having a lower than normal BP.[name]. Her BP readings have been 100/55 on Saturday with HR 55,  Sunday BP 133/76,  and BP today is 110/66 HR 70... She said that she has held her Losartan since Saturday..  Saturday she had Blurred vision, headache, and dizziness... She says those symptoms have improved. I asked her if she thinks she has been drinking enough fluids and she says she has as she normally does but worries about too much fluid with her CHF.[name]. She denies slurred speech, facial drooping on Saturday with her other symptoms.. I advised her to call EMS if those symptoms reoccur and she started crying because she has a house full of people coming.. I advised her to have her son monitor her since he lives with her..  Will forward to Dr.Hochrein for his recommendations.  Pt has also called her PMD.. Dr. Durene CalHunter.

## 2018-10-05 NOTE — Telephone Encounter (Signed)
New message    Pt c/o BP issue: STAT if pt c/o blurred vision, one-sided weakness or slurred speech  1. What are your last 5 BP readings? 100/55, 133/76, 110/66  2. Are you having any other symptoms (ex. Dizziness, headache, blurred vision, passed out)? Blurred vision, dizziness, headache   3. What is your BP issue? Pt states she has not taken her bp medication in 2 days

## 2018-10-05 NOTE — Telephone Encounter (Signed)
See note

## 2018-10-06 NOTE — Telephone Encounter (Signed)
Continue to hold Cozaar and keep a BP diary.

## 2018-10-06 NOTE — Telephone Encounter (Signed)
Pt updated with Dr. Hochrein's recommendation. Pt verbalized understanding. 

## 2018-10-19 ENCOUNTER — Other Ambulatory Visit: Payer: Self-pay

## 2018-10-19 MED ORDER — GLUCOSE BLOOD VI STRP: ORAL_STRIP | 3 refills | Status: DC

## 2018-12-05 ENCOUNTER — Other Ambulatory Visit: Payer: Self-pay | Admitting: Family Medicine

## 2018-12-07 ENCOUNTER — Other Ambulatory Visit: Payer: Self-pay | Admitting: Family Medicine

## 2018-12-28 ENCOUNTER — Ambulatory Visit: Payer: Medicare Other | Admitting: Family Medicine

## 2019-01-14 ENCOUNTER — Ambulatory Visit (INDEPENDENT_AMBULATORY_CARE_PROVIDER_SITE_OTHER): Payer: Medicare Other | Admitting: Family Medicine

## 2019-01-14 ENCOUNTER — Encounter: Payer: Self-pay | Admitting: Family Medicine

## 2019-01-14 VITALS — BP 130/70 | HR 65 | Temp 97.9°F | Ht 63.0 in | Wt 160.2 lb

## 2019-01-14 DIAGNOSIS — E663 Overweight: Secondary | ICD-10-CM | POA: Diagnosis not present

## 2019-01-14 DIAGNOSIS — Z6828 Body mass index (BMI) 28.0-28.9, adult: Secondary | ICD-10-CM | POA: Diagnosis not present

## 2019-01-14 DIAGNOSIS — I5042 Chronic combined systolic (congestive) and diastolic (congestive) heart failure: Secondary | ICD-10-CM

## 2019-01-14 DIAGNOSIS — I4821 Permanent atrial fibrillation: Secondary | ICD-10-CM

## 2019-01-14 DIAGNOSIS — E034 Atrophy of thyroid (acquired): Secondary | ICD-10-CM | POA: Diagnosis not present

## 2019-01-14 DIAGNOSIS — E119 Type 2 diabetes mellitus without complications: Secondary | ICD-10-CM | POA: Diagnosis not present

## 2019-01-14 DIAGNOSIS — E538 Deficiency of other specified B group vitamins: Secondary | ICD-10-CM | POA: Diagnosis not present

## 2019-01-14 DIAGNOSIS — M810 Age-related osteoporosis without current pathological fracture: Secondary | ICD-10-CM | POA: Diagnosis not present

## 2019-01-14 LAB — CBC
HCT: 40.7 % (ref 36.0–46.0)
Hemoglobin: 13.6 g/dL (ref 12.0–15.0)
MCHC: 33.5 g/dL (ref 30.0–36.0)
MCV: 87 fl (ref 78.0–100.0)
Platelets: 190 10*3/uL (ref 150.0–400.0)
RBC: 4.67 Mil/uL (ref 3.87–5.11)
RDW: 13.6 % (ref 11.5–15.5)
WBC: 6.6 10*3/uL (ref 4.0–10.5)

## 2019-01-14 LAB — COMPREHENSIVE METABOLIC PANEL
ALT: 10 U/L (ref 0–35)
AST: 16 U/L (ref 0–37)
Albumin: 4.2 g/dL (ref 3.5–5.2)
Alkaline Phosphatase: 79 U/L (ref 39–117)
BILIRUBIN TOTAL: 0.5 mg/dL (ref 0.2–1.2)
BUN: 26 mg/dL — ABNORMAL HIGH (ref 6–23)
CO2: 29 mEq/L (ref 19–32)
Calcium: 9.2 mg/dL (ref 8.4–10.5)
Chloride: 106 mEq/L (ref 96–112)
Creatinine, Ser: 1.1 mg/dL (ref 0.40–1.20)
GFR: 46.44 mL/min — ABNORMAL LOW (ref >60.00)
Glucose, Bld: 108 mg/dL — ABNORMAL HIGH (ref 70–99)
Potassium: 4.2 mEq/L (ref 3.5–5.1)
Sodium: 142 mEq/L (ref 135–145)
Total Protein: 7.2 g/dL (ref 6.0–8.3)

## 2019-01-14 LAB — POCT GLYCOSYLATED HEMOGLOBIN (HGB A1C): Hemoglobin A1C: 5.8 % — AB (ref 4.0–5.6)

## 2019-01-14 LAB — LIPID PANEL
CHOL/HDL RATIO: 3
Cholesterol: 134 mg/dL (ref 0–200)
HDL: 43.6 mg/dL (ref >39.00)
LDL Cholesterol: 63 mg/dL (ref 0–99)
NonHDL: 90.29
Triglycerides: 136 mg/dL (ref 0.0–149.0)
VLDL: 27.2 mg/dL (ref 0.0–40.0)

## 2019-01-14 LAB — VITAMIN B12: Vitamin B-12: 289 pg/mL (ref 211–911)

## 2019-01-14 LAB — TSH: TSH: 1.13 u[IU]/mL (ref 0.35–4.50)

## 2019-01-14 NOTE — Patient Instructions (Addendum)
Health Maintenance Due  Topic Date Due  . FOOT EXAM  Done today 12/28/2018   Schedule your bone density test at check out desk. You may also call directly to X-ray at [phone] to schedule an appointment that is convenient for you.  - located [address] across the street from [place] - in the basement - you do need an appointment for the bone density tests.   No medications changes yet today unless bloodwork leads [place] to make changes  Please stop by lab before you go If you do not have mychart- we will call you about results within 5 business days of [place] receiving them.  If you have mychart- we will send your results within 3 business days of [place] receiving them.  If abnormal or we want to clarify a result, we will call or mychart you to make sure you receive the message.  If you have questions or concerns or don't hear within 5-7 days, please send [place] a message or call us.

## 2019-01-14 NOTE — Assessment & Plan Note (Signed)
S: Left femoral neck with T score of -2.8 in 2017.  Had recommended Fosamax in 2017. A/P:  She didn't start fosamax- I believe she is worried about reflux. Will update bone density- she states would consider this.  With history of barretts would stop fosamax immediately if reflux symptoms

## 2019-01-14 NOTE — Progress Notes (Signed)
Phone [phone]   Subjective:  Victoria Gomez is a 85 y.o. year old very pleasant female patient who presents for/with See problem oriented charting ROS- no chest pain recently. Stable shortness of breath. Stable edema. No blurry vision . Occasional dizziness  Past Medical History-  Patient Active Problem List   Diagnosis Date Noted  . Diabetes mellitus II 09/22/2013    Priority: High  . Permanent atrial fibrillation 09/21/2013    Priority: High  . H/O sinus bradycardia 08/10/2012    Priority: High  . Combined systolic and diastolic congestive heart failure (HCC) 11/17/2011    Priority: High  . Coronary artery disease involving native coronary artery of native heart with angina pectoris (HCC) 08/03/2007    Priority: High  . Dyslipidemia 09/28/2018    Priority: Medium  . Osteoporosis 06/28/2015    Priority: Medium  . Vitamin B12 deficiency 07/28/2014    Priority: Medium  . CKD (chronic kidney disease), stage III (HCC) 09/22/2013    Priority: Medium  . LBBB (left bundle branch block) 09/22/2013    Priority: Medium  . Interstitial cystitis     Priority: Medium  . Hyperlipidemia 03/07/2008    Priority: Medium  . Hypothyroidism 08/03/2007    Priority: Medium  . Essential hypertension 08/03/2007    Priority: Medium  . Chronic anticoagulation 07/09/2017    Priority: Low  . Left groin hernia 06/25/2017    Priority: Low  . Internal hemorrhoids     Priority: Low  . Orthostatic hypotension 08/10/2012    Priority: Low  . Fatigue 04/20/2012    Priority: Low  . ARTHRITIS 12/30/2010    Priority: Low  . Left lumbar radiculopathy 12/11/2010    Priority: Low  . Esophageal reflux 10/18/2007    Priority: Low  . History of Barrett's esophagus 08/03/2007    Priority: Low    Medications- reviewed and updated Current Outpatient Medications  Medication Sig Dispense Refill  . acetaminophen (TYLENOL) 500 MG tablet Take 500 mg by mouth every 6 (six) hours as needed for mild pain.  Reported on 12/12/2015    . albuterol (PROVENTIL HFA;VENTOLIN HFA) 108 (90 Base) MCG/ACT inhaler TAKE 2 PUFFS BY MOUTH EVERY 6 HOURS AS NEEDED FOR WHEEZE OR SHORTNESS OF BREATH 8.5 Inhaler 1  . apixaban (ELIQUIS) 2.5 MG TABS tablet Take 1 tablet (2.5 mg total) by mouth 2 (two) times daily. 180 tablet 3  . diclofenac sodium (VOLTAREN) 1 % GEL Apply 2 g topically 4 (four) times daily. 100 g 3  . furosemide (LASIX) 20 MG tablet TAKE 3 TABLETS BY MOUTH EVERY DAY 270 tablet 1  . glucose blood (ONETOUCH VERIO) test strip USE TO TEST BLOOD SUGARS DAILY. DX: E11.9 100 each 3  . isosorbide mononitrate (IMDUR) 120 MG 24 hr tablet Take 1 tablet (120 mg total) by mouth daily. 90 tablet 3  . KLOR-CON M10 10 MEQ tablet TAKE 1 TABLET BY MOUTH EVERY DAY 90 tablet 2  . lansoprazole (PREVACID) 30 MG capsule TAKE 1 CAPSULE TWICE DAILY 180 capsule 1  . levothyroxine (SYNTHROID) 50 MCG tablet Take 1 tablet (50 mcg total) by mouth daily before breakfast. 90 tablet 3  . losartan (COZAAR) 100 MG tablet Take 1 tablet (100 mg total) by mouth daily. 90 tablet 3  . metoprolol tartrate (LOPRESSOR) 25 MG tablet TAKE 1 TABLET BY MOUTH TWICE A DAY 180 tablet 3  . nitroGLYCERIN (NITROSTAT) 0.4 MG SL tablet ONE UNDER THE TONGUE EVERY 5 MINUTES AS NEEDED FOR CHEST PAIN. IF PAIN  AFTER 3 PILLS, CALL 911 25 tablet 1  . ONETOUCH DELICA LANCETS 33G MISC Use to test blood sugars daily. Dx: E11.9 100 each 11  . pentosan polysulfate (ELMIRON) 100 MG capsule Take 100 mg by mouth 2 (two) times daily.     . pravastatin (PRAVACHOL) 40 MG tablet TAKE 1 TABLET DAILY 90 tablet 3   No current facility-administered medications for this visit.      Objective:  BP 130/70 (BP Location: Left Arm, Patient Position: Sitting, Cuff Size: Large)   Pulse 65   Temp 97.9 F (36.6 C) (Oral)   Ht 5\' 3"  (1.6 m)   Wt 160 lb 3.2 oz (72.7 kg)   SpO2 97%   BMI 28.38 kg/m  Gen: NAD, resting comfortably CV: Irregularly irregular no murmurs rubs or  gallops Lungs: CTAB no crackles, wheeze, rhonchi Abdomen: soft/nontender/overweight Ext: nonpitting edema Skin: warm, dry    Assessment and Plan   #Permanent atrial fibrillation S: Compliant with Eliquis 2.5 mg twice a day. On metoprolol for rate control.  A/P: Stable. Continue current medications.     #Diabetes mellitus S: Has been diet controlled. Lab Results  Component Value Date   HGBA1C 5.8 (A) 01/14/2019  A/P: Stable. Continue withoutmedications.     #Combined systolic/diastolic congestive heart failure. S: Compliant with Lasix 60 mg daily. Weight down 2 lbs. Swelling is stable. Still with some shortness of breath going up steps- not worsening A/P:  Stable. Continue current medications.   #CAD with angina status post coronary angioplasty in 2001 and 2003/hyperlipidemia S: Compliant with pravastatin 40 mg.  Has been chest pain-free on Imdur. On eliquis- not on aspirin per Dr. Antoine Poche  A/P:Stable x2. Continue current medications.    #Hypertension/CKD stage III S: Compliant with Imdur 120 mg, metoprolol 25 mg twice a day, Lasix 60 mg, losartan 100 mg daily.  GFR has been stable in the 35-50 range.  Has had orthostatic intolerance in the past- less dizziness lately  A/P: Stable x 2. Continue current medications. Update gfr    #Hypothyroidism S: Compliant with levothyroxine 50 mcg A/P: suspect stable- update b12   % History of Barrett's esophagus on EGD 2016.  In 2015 we rediscussed and she opted not to pursue further EGD given her age.  She remains on Prevacid 30 mg twice a day -We will plan to intermittently check B12- including today    % Interstitial cystitis-on [place] through urology  # still dealing with back pain- going to return to orthopedics  Osteoporosis S: Left femoral neck with T score of -2.8 in 2017.  Had recommended Fosamax in 2017. A/P:  She didn't start fosamax- I believe she is worried about reflux. Will update bone density- she states would  consider this.  With history of barretts would stop fosamax immediately if reflux symptoms  Return in about 4 months (around 05/16/2019) for follow up- or sooner if needed.  Lab/Order associations: FASTING Type 2 diabetes mellitus without complication, without long-term current use of insulin (HCC) - Plan: POCT glycosylated hemoglobin (Hb A1C), CBC, Comprehensive metabolic panel, Lipid panel  Chronic combined systolic and diastolic congestive heart failure (HCC)  Permanent atrial fibrillation  Vitamin B12 deficiency - Plan: Vitamin B12  Hypothyroidism due to acquired atrophy of thyroid - Plan: TSH  Age-related osteoporosis without current pathological fracture - Plan: DG Bone Density  Overweight  BMI 28.0-28.9,adult  Return precautions advised.  Tana Conch, MD

## 2019-01-21 ENCOUNTER — Ambulatory Visit: Payer: Self-pay

## 2019-01-21 NOTE — Telephone Encounter (Signed)
Patient called and asked when is her next pneumonia vaccine due. I advised when she had the last one and advised that Dr. Durene Cal will have to determine if she needs it again. She says the only reason she asked is because her neighbor who is a truck driver came over to her house after coming back from [place]. She says he was coughing every breath and will not go see about it. She says she is just being cautious to make sure she's up to date on her vaccines. I advised someone from the office will call back with the recommendation, she verbalized understanding.  Reason for Disposition . [1] Caller requesting NON-URGENT health information AND [2] PCP's office is the best resource  Protocols used: INFORMATION ONLY CALL-A-AH

## 2019-01-21 NOTE — Telephone Encounter (Signed)
Called patient mail box full not able to leave message.  

## 2019-01-21 NOTE — Telephone Encounter (Signed)
See note

## 2019-01-25 NOTE — Telephone Encounter (Signed)
Spoke to patient and informed that her immunizations are UTD.  Patient also reported that she is due to have her bone density test done and inquired if it would be okay to postpone this given the coronavirus pandemic.  Advised patient that this is perfectly fine; this is a routine screening test and can be postponed until a later date.  Patient verbalized understanding.

## 2019-02-17 ENCOUNTER — Other Ambulatory Visit: Payer: Self-pay | Admitting: Family Medicine

## 2019-03-21 ENCOUNTER — Other Ambulatory Visit: Payer: Self-pay | Admitting: Cardiology

## 2019-03-21 ENCOUNTER — Other Ambulatory Visit: Payer: Self-pay | Admitting: Family Medicine

## 2019-03-21 DIAGNOSIS — K2100 Gastro-esophageal reflux disease with esophagitis, without bleeding: Secondary | ICD-10-CM

## 2019-04-09 ENCOUNTER — Other Ambulatory Visit: Payer: Self-pay | Admitting: Family Medicine

## 2019-05-04 ENCOUNTER — Telehealth: Payer: Self-pay | Admitting: *Deleted

## 2019-05-04 NOTE — Telephone Encounter (Signed)
   Spoke with patient and she answered no to all of the below questions    COVID-19 Pre-Screening Questions:  . In the past 7 to 10 days have you had a cough,  shortness of breath, headache, congestion, fever (100 or greater) body aches, chills, sore throat, or sudden loss of taste or sense of smell? . Have you been around anyone with known Covid 19. . Have you been around anyone who is awaiting Covid 19 test results in the past 7 to 10 days? . Have you been around anyone who has been exposed to Covid 19, or has mentioned symptoms of Covid 19 within the past 7 to 10 days?  If you have any concerns/questions about symptoms patients report during screening (either on the phone or at threshold). Contact the provider seeing the patient or DOD for further guidance.  If neither are available contact a member of the leadership team.

## 2019-05-09 ENCOUNTER — Telehealth: Payer: Self-pay | Admitting: Cardiology

## 2019-05-09 NOTE — Progress Notes (Signed)
HPI The patient presents for followup up of atrial fibrillation, chronic recurrent diastolic and systolic HF and CAD.   The patient was admitted 09/13/17 with flash pulmonary edema and hypertension. After admission she developed CP with new anterolateral TWI and a mildly elevated troponin. Cath done 09/16/17 showed a CTO of her RCA with L-R collaterals, 45% LAD, and 70% OM1.  The plan is for medical Rx.  Her EF was 40 - 45%.   Since I last saw her she she has done well.  She has been making pickles.  She has been canning apples.  She has been freezing strawberries.  With all of this she has done well.  The patient denies any new symptoms such as chest discomfort, neck or arm discomfort. There has been no new shortness of breath, PND or orthopnea. There have been no reported palpitations, presyncope or syncope.  She is limited by back pain.   Allergies  Allergen Reactions  . Codeine Hives and Nausea And Vomiting  . Sulfonamide Derivatives Swelling    Turns red    Current Outpatient Medications  Medication Sig Dispense Refill  . acetaminophen (TYLENOL) 500 MG tablet Take 500 mg by mouth every 6 (six) hours as needed for mild pain. Reported on 12/12/2015    . albuterol (PROVENTIL HFA;VENTOLIN HFA) 108 (90 Base) MCG/ACT inhaler TAKE 2 PUFFS BY MOUTH EVERY 6 HOURS AS NEEDED FOR WHEEZE OR SHORTNESS OF BREATH 8.5 Inhaler 1  . diclofenac sodium (VOLTAREN) 1 % GEL Apply 2 g topically 4 (four) times daily. 100 g 3  . ELIQUIS 2.5 MG TABS tablet TAKE 1 TABLET TWICE A DAY 180 tablet 3  . furosemide (LASIX) 20 MG tablet TAKE 3 TABLETS BY MOUTH EVERY DAY 270 tablet 1  . glucose blood (ONETOUCH VERIO) test strip USE TO TEST BLOOD SUGARS DAILY. DX: E11.9 100 each 3  . isosorbide mononitrate (IMDUR) 120 MG 24 hr tablet Take 1 tablet (120 mg total) by mouth daily. 90 tablet 3  . KLOR-CON M10 10 MEQ tablet TAKE 1 TABLET BY MOUTH EVERY DAY 90 tablet 2  . lansoprazole (PREVACID) 30 MG capsule TAKE 1 CAPSULE TWICE  DAILY 180 capsule 1  . losartan (COZAAR) 100 MG tablet Take 1 tablet (100 mg total) by mouth daily. 90 tablet 3  . metoprolol tartrate (LOPRESSOR) 25 MG tablet TAKE 1 TABLET BY MOUTH TWICE A DAY 180 tablet 3  . nitroGLYCERIN (NITROSTAT) 0.4 MG SL tablet ONE UNDER THE TONGUE EVERY 5 MINUTES AS NEEDED FOR CHEST PAIN. IF PAIN AFTER 3 PILLS, CALL 911 25 tablet 1  . ONETOUCH DELICA LANCETS 33G MISC Use to test blood sugars daily. Dx: E11.9 100 each 11  . pentosan polysulfate (ELMIRON) 100 MG capsule Take 100 mg by mouth 2 (two) times daily.     . pravastatin (PRAVACHOL) 40 MG tablet TAKE 1 TABLET DAILY 90 tablet 0  . SYNTHROID 50 MCG tablet TAKE 1 TABLET DAILY BEFORE BREAKFAST. 90 tablet 3   No current facility-administered medications for this visit.     Past Medical History:  Diagnosis Date  . Arthritis   . Atrial fibrillation (HCC)    a. Dx 09/2013.  . Bladder disorder   . Bradycardia 08/2012   a. Eval for symptomatic bradycardia 08/2012 - beta blocker discontinued.  [name]. BREAST MASS, BENIGN   . CAD (coronary artery disease)    a. s/p PCI to RCA '01. b. repeat PCI to RCA '03 2/2 ISR. c. cath 2012: RCA dz,  rx medically.  Occluded RCA.  Nonobstructive disease elsewhere  . Chronic combined systolic and diastolic CHF (congestive heart failure) ([place])    a. EF 40-45% in 2014. b. EF reported to be normal in 03/2015 nuc.  . CKD (chronic kidney disease), stage III ([place])   . DIVERTICULOSIS, COLON   . Epistaxis   . Esophageal reflux   . History of breast lump/mass excision 05/23/2009   Qualifier: History of  By: Arnoldo Morale MD, Balinda Quails   . HYPERLIPIDEMIA 03/07/2008  . HYPERTENSION 08/03/2007  . HYPOTHYROIDISM   . Internal hemorrhoids   . Interstitial cystitis   . LBBB (left bundle branch block)   . Orthostatic hypotension 08/2012   a. 08/2012 w/ syncope - meds adjusted.   . Right hand fracture     Past Surgical History:  Procedure Laterality Date  . ABDOMINAL HYSTERECTOMY    . ANKLE  RECONSTRUCTION     Left  . APPENDECTOMY    . BREAST SURGERY     benign mass  . CARDIOVERSION N/A 10/20/2013  . CARDIOVERSION N/A 04/24/2015   Procedure: CARDIOVERSION;  Surgeon: Dorothy Spark, MD;  Location: [facility];  Service: Cardiovascular;  Laterality: N/A;  . CATARACT EXTRACTION, BILATERAL    . CESAREAN SECTION    . CHOLECYSTECTOMY    . LEFT HEART CATH AND CORONARY ANGIOGRAPHY N/A 09/16/2017   Procedure: LEFT HEART CATH AND CORONARY ANGIOGRAPHY;  Surgeon: Martinique, Peter M, MD;  Location: [facility];  Service: Cardiovascular;  Laterality: N/A;  . TONSILLECTOMY    . WRIST RECONSTRUCTION     Right    ROS:  As stated in the HPI and negative for all other systems.  PHYSICAL EXAM There were no vitals taken for this visit.  GENERAL:  Well appearing NECK:  No jugular venous distention, waveform within normal limits, carotid upstroke brisk and symmetric, no bruits, no thyromegaly LUNGS:  Clear to auscultation bilaterally CHEST:  Unremarkable HEART:  PMI not displaced or sustained,S1 and S2 within normal limits, no S3,  no clicks, no rubs, no murmurs ABD:  Flat, positive bowel sounds normal in frequency in pitch, no bruits, no rebound, no guarding, no midline pulsatile mass, no hepatomegaly, no splenomegaly EXT:  2 plus pulses throughout, no edema, no cyanosis no clubbing  EKG:   Atrial fibrillation, rate 66, premature ectopic complexes, left bundle branch block, no acute ST-T wave changes.   05/09/2019  Lab Results  Component Value Date   CHOL 134 01/14/2019   TRIG 136.0 01/14/2019   HDL 43.60 01/14/2019   LDLCALC 63 01/14/2019   LDLDIRECT 65.0 06/26/2016     ASSESSMENT AND PLAN  ATRIAL FIB -   Ms. ASHIKA APUZZO has a CHA2DS2 - VASc score of 5. The patient  tolerates this rhythm and rate control and anticoagulation. We will continue with the meds as listed.  CORONARY ARTERY DISEASE -    The patient has no new sypmtoms.  No further cardiovascular testing is  indicated.  We will continue with aggressive risk reduction and meds as listed.  HYPERTENSION -  Her blood pressure is at target.  No change in therapy   Diastolic HF (heart failure) -        She seems to be euvolemic.  No change in therapy.   CAROTID -   This was mild in 2016.  No further testing.   Dyslipidemia -    LDL was 63.  No change in therapy.

## 2019-05-09 NOTE — Telephone Encounter (Signed)

## 2019-05-10 ENCOUNTER — Other Ambulatory Visit: Payer: Self-pay

## 2019-05-10 ENCOUNTER — Ambulatory Visit (INDEPENDENT_AMBULATORY_CARE_PROVIDER_SITE_OTHER): Payer: Medicare Other | Admitting: Cardiology

## 2019-05-10 ENCOUNTER — Encounter: Payer: Self-pay | Admitting: Cardiology

## 2019-05-10 VITALS — BP 118/56 | HR 66 | Temp 97.3°F | Ht 63.0 in | Wt 163.2 lb

## 2019-05-10 DIAGNOSIS — I10 Essential (primary) hypertension: Secondary | ICD-10-CM | POA: Diagnosis not present

## 2019-05-10 DIAGNOSIS — I447 Left bundle-branch block, unspecified: Secondary | ICD-10-CM

## 2019-05-10 DIAGNOSIS — I25119 Atherosclerotic heart disease of native coronary artery with unspecified angina pectoris: Secondary | ICD-10-CM | POA: Diagnosis not present

## 2019-05-10 DIAGNOSIS — I5032 Chronic diastolic (congestive) heart failure: Secondary | ICD-10-CM | POA: Diagnosis not present

## 2019-05-10 DIAGNOSIS — E785 Hyperlipidemia, unspecified: Secondary | ICD-10-CM

## 2019-05-10 DIAGNOSIS — I4821 Permanent atrial fibrillation: Secondary | ICD-10-CM | POA: Diagnosis not present

## 2019-05-10 DIAGNOSIS — I5042 Chronic combined systolic (congestive) and diastolic (congestive) heart failure: Secondary | ICD-10-CM

## 2019-05-10 NOTE — Patient Instructions (Signed)
Medication Instructions:  Continue current medications  If you need a refill on your cardiac medications before your next appointment, please call your pharmacy.  Labwork: None Ordered   Testing/Procedures: None Ordered  Follow-Up: You will need a follow up appointment in 1 Year.  Please call our office 2 months in advance to schedule this appointment.  You may see Dr Minus Breeding or one of the following Advanced Practice Providers on your designated Care Team:   Rosaria Ferries, PA-C . Jory Sims, DNP, ANP      At [facility], you and your health needs are our priority.  As part of our continuing mission to provide you with exceptional heart care, we have created designated Provider Care Teams.  These Care Teams include your primary Cardiologist (physician) and Advanced Practice Providers (APPs -  Physician Assistants and Nurse Practitioners) who all work together to provide you with the care you need, when you need it.  Thank you for choosing [facility] at [facility]!!

## 2019-05-12 ENCOUNTER — Encounter: Payer: Self-pay | Admitting: Cardiology

## 2019-05-12 ENCOUNTER — Other Ambulatory Visit (INDEPENDENT_AMBULATORY_CARE_PROVIDER_SITE_OTHER): Payer: Medicare Other

## 2019-05-12 DIAGNOSIS — I5042 Chronic combined systolic (congestive) and diastolic (congestive) heart failure: Secondary | ICD-10-CM

## 2019-05-12 DIAGNOSIS — I4821 Permanent atrial fibrillation: Secondary | ICD-10-CM

## 2019-05-12 DIAGNOSIS — I447 Left bundle-branch block, unspecified: Secondary | ICD-10-CM

## 2019-05-12 DIAGNOSIS — I10 Essential (primary) hypertension: Secondary | ICD-10-CM

## 2019-05-12 DIAGNOSIS — I25119 Atherosclerotic heart disease of native coronary artery with unspecified angina pectoris: Secondary | ICD-10-CM

## 2019-05-16 ENCOUNTER — Encounter: Payer: Self-pay | Admitting: Family Medicine

## 2019-05-16 ENCOUNTER — Ambulatory Visit (INDEPENDENT_AMBULATORY_CARE_PROVIDER_SITE_OTHER): Payer: Medicare Other | Admitting: Family Medicine

## 2019-05-16 ENCOUNTER — Other Ambulatory Visit: Payer: Self-pay

## 2019-05-16 VITALS — BP 110/70 | HR 61 | Temp 98.3°F | Ht 63.0 in | Wt 162.0 lb

## 2019-05-16 DIAGNOSIS — E1169 Type 2 diabetes mellitus with other specified complication: Secondary | ICD-10-CM | POA: Diagnosis not present

## 2019-05-16 DIAGNOSIS — N183 Chronic kidney disease, stage 3 unspecified: Secondary | ICD-10-CM

## 2019-05-16 DIAGNOSIS — E1122 Type 2 diabetes mellitus with diabetic chronic kidney disease: Secondary | ICD-10-CM | POA: Diagnosis not present

## 2019-05-16 DIAGNOSIS — I25119 Atherosclerotic heart disease of native coronary artery with unspecified angina pectoris: Secondary | ICD-10-CM

## 2019-05-16 DIAGNOSIS — Z79899 Other long term (current) drug therapy: Secondary | ICD-10-CM | POA: Diagnosis not present

## 2019-05-16 DIAGNOSIS — E034 Atrophy of thyroid (acquired): Secondary | ICD-10-CM | POA: Diagnosis not present

## 2019-05-16 DIAGNOSIS — I5042 Chronic combined systolic (congestive) and diastolic (congestive) heart failure: Secondary | ICD-10-CM | POA: Diagnosis not present

## 2019-05-16 DIAGNOSIS — E1159 Type 2 diabetes mellitus with other circulatory complications: Secondary | ICD-10-CM

## 2019-05-16 DIAGNOSIS — I10 Essential (primary) hypertension: Secondary | ICD-10-CM | POA: Diagnosis not present

## 2019-05-16 DIAGNOSIS — E785 Hyperlipidemia, unspecified: Secondary | ICD-10-CM | POA: Diagnosis not present

## 2019-05-16 LAB — COMPREHENSIVE METABOLIC PANEL
ALT: 11 U/L (ref 0–35)
AST: 15 U/L (ref 0–37)
Albumin: 4.2 g/dL (ref 3.5–5.2)
Alkaline Phosphatase: 72 U/L (ref 39–117)
BUN: 23 mg/dL (ref 6–23)
CO2: 29 mEq/L (ref 19–32)
Calcium: 9 mg/dL (ref 8.4–10.5)
Chloride: 105 mEq/L (ref 96–112)
Creatinine, Ser: 1.21 mg/dL — ABNORMAL HIGH (ref 0.40–1.20)
GFR: 41.57 mL/min — ABNORMAL LOW (ref >60.00)
Glucose, Bld: 92 mg/dL (ref 70–99)
Potassium: 4.3 mEq/L (ref 3.5–5.1)
Sodium: 141 mEq/L (ref 135–145)
Total Bilirubin: 0.5 mg/dL (ref 0.2–1.2)
Total Protein: 7.1 g/dL (ref 6.0–8.3)

## 2019-05-16 LAB — TSH: TSH: 1.13 u[IU]/mL (ref 0.35–4.50)

## 2019-05-16 LAB — HEMOGLOBIN A1C: Hgb A1c MFr Bld: 6.4 % (ref 4.6–6.5)

## 2019-05-16 LAB — VITAMIN B12: Vitamin B-12: 960 pg/mL — ABNORMAL HIGH (ref 211–911)

## 2019-05-16 NOTE — Progress Notes (Signed)
Phone [phone]   Subjective:  Victoria Gomez is a 85 y.o. year old very pleasant female patient who presents for/with See problem oriented charting Chief Complaint  Patient presents with  . Atrial Fibrillation  . Diabetes   ROS- No chest pain or shortness of breath. No blurry vision. Occasional mild headaches. Continued back pain- needs to follow up with Dr. Ethelene Halamos  Past Medical History-  Patient Active Problem List   Diagnosis Date Noted  . Controlled type 2 diabetes with renal manifestation (HCC) 09/22/2013    Priority: High  . Permanent atrial fibrillation 09/21/2013    Priority: High  . H/O sinus bradycardia 08/10/2012    Priority: High  . Combined systolic and diastolic congestive heart failure (HCC) 11/17/2011    Priority: High  . Coronary artery disease involving native coronary artery of native heart with angina pectoris (HCC) 08/03/2007    Priority: High  . Dyslipidemia 09/28/2018    Priority: Medium  . Osteoporosis 06/28/2015    Priority: Medium  . Vitamin B12 deficiency 07/28/2014    Priority: Medium  . CKD (chronic kidney disease), stage III (HCC) 09/22/2013    Priority: Medium  . LBBB (left bundle branch block) 09/22/2013    Priority: Medium  . Interstitial cystitis     Priority: Medium  . Hyperlipidemia associated with type 2 diabetes mellitus (HCC) 03/07/2008    Priority: Medium  . Hypothyroidism 08/03/2007    Priority: Medium  . Hypertension associated with diabetes (HCC) 08/03/2007    Priority: Medium  . Chronic anticoagulation 07/09/2017    Priority: Low  . Left groin hernia 06/25/2017    Priority: Low  . Internal hemorrhoids     Priority: Low  . Orthostatic hypotension 08/10/2012    Priority: Low  . Fatigue 04/20/2012    Priority: Low  . ARTHRITIS 12/30/2010    Priority: Low  . Left lumbar radiculopathy 12/11/2010    Priority: Low  . Esophageal reflux 10/18/2007    Priority: Low  . History of Barrett's esophagus 08/03/2007   Priority: Low    Medications- reviewed and updated Current Outpatient Medications  Medication Sig Dispense Refill  . acetaminophen (TYLENOL) 500 MG tablet Take 500 mg by mouth every 6 (six) hours as needed for mild pain. Reported on 12/12/2015    . albuterol (PROVENTIL HFA;VENTOLIN HFA) 108 (90 Base) MCG/ACT inhaler TAKE 2 PUFFS BY MOUTH EVERY 6 HOURS AS NEEDED FOR WHEEZE OR SHORTNESS OF BREATH 8.5 Inhaler 1  . diclofenac sodium (VOLTAREN) 1 % GEL Apply 2 g topically 4 (four) times daily. 100 g 3  . ELIQUIS 2.5 MG TABS tablet TAKE 1 TABLET TWICE A DAY 180 tablet 3  . furosemide (LASIX) 20 MG tablet TAKE 3 TABLETS BY MOUTH EVERY DAY 270 tablet 1  . glucose blood (ONETOUCH VERIO) test strip USE TO TEST BLOOD SUGARS DAILY. DX: E11.9 100 each 3  . isosorbide mononitrate (IMDUR) 120 MG 24 hr tablet Take 1 tablet (120 mg total) by mouth daily. 90 tablet 3  . KLOR-CON M10 10 MEQ tablet TAKE 1 TABLET BY MOUTH EVERY DAY 90 tablet 2  . lansoprazole (PREVACID) 30 MG capsule TAKE 1 CAPSULE TWICE DAILY 180 capsule 1  . losartan (COZAAR) 100 MG tablet Take 1 tablet (100 mg total) by mouth daily. 90 tablet 3  . metoprolol tartrate (LOPRESSOR) 25 MG tablet TAKE 1 TABLET BY MOUTH TWICE A DAY 180 tablet 3  . nitroGLYCERIN (NITROSTAT) 0.4 MG SL tablet ONE UNDER THE TONGUE EVERY 5 MINUTES  AS NEEDED FOR CHEST PAIN. IF PAIN AFTER 3 PILLS, CALL 911 25 tablet 1  . ONETOUCH DELICA LANCETS 33G MISC Use to test blood sugars daily. Dx: E11.9 100 each 11  . pentosan polysulfate (ELMIRON) 100 MG capsule Take 100 mg by mouth 2 (two) times daily.     . pravastatin (PRAVACHOL) 40 MG tablet TAKE 1 TABLET DAILY 90 tablet 0  . SYNTHROID 50 MCG tablet TAKE 1 TABLET DAILY BEFORE BREAKFAST. 90 tablet 3   No current facility-administered medications for this visit.      Objective:  BP 110/70   Pulse 61   Temp 98.3 F (36.8 C) (Oral)   Ht 5\' 3"  (1.6 m)   Wt 162 lb (73.5 kg)   SpO2 98%   BMI 28.70 kg/m  Gen: NAD, resting  comfortably CV: RRR no murmurs rubs or gallops Lungs: CTAB no crackles, wheeze, rhonchi Abdomen: soft/nontender/nondistended/normal bowel sounds. No rebound or guarding.  Ext: no edema Skin: warm, dry Neuro: Slightly hard of hearing with both of us masked Diabetic Foot Exam - Simple   Simple Foot Form Diabetic Foot exam was performed with the following findings: Yes 05/16/2019 11:55 AM  Visual Inspection See comments: Yes Sensation Testing Intact to touch and monofilament testing bilaterally: Yes Pulse Check Posterior Tibialis and Dorsalis pulse intact bilaterally: Yes Comments Toe nails need to be trimmed         Assessment and Plan   # Diabetes S: Diet controlled  CBGs- sugars at home are "ok" - she cant remember #s as didn't sleep well last night Exercise and diet- weight up slightly Lab Results  Component Value Date   HGBA1C 5.8 (A) 01/14/2019   HGBA1C 6.3 08/27/2018   HGBA1C 6.5 04/27/2018   A/P: Hopefully stable-update A1c today  #hypertension S: controlled on Furosemide 60mg  daily, Isosorbide mononitrate 120 mg, Metoprolol 25 mg BID, and Losartan 100 mg.  BP Readings from Last 3 Encounters:  05/16/19 110/70  05/10/19 (!) 118/56  01/14/19 130/70  A/P:  Stable. Continue current medications.   Will need to monitor closely- blood pressure running lower- may need to reduce medication such as losartan to 50mg   #hyperlipidemia/CAD S: Lipids controlled on Pravastatin 40 mg daily.  Patient is also compliant with eliquis- she is not on aspirin due to being on eliquis.  Denies chest pain or shortness of breath - no chest pain on imdur recently Lab Results  Component Value Date   CHOL 134 01/14/2019   HDL 43.60 01/14/2019   LDLCALC 63 01/14/2019   LDLDIRECT 65.0 06/26/2016   TRIG 136.0 01/14/2019   CHOLHDL 3 01/14/2019   A/P:  Stable x2. Continue current medications.    #hypothyroidism S: On thyroid medication-Levothyroxine 50 mcg daily.   Lab Results  Component  Value Date   TSH 1.13 01/14/2019   A/P: Hopefully stable-update TSH today  # CKD S: Patient is on ARB in case proteinuric element. A/P: Hopefully stable-update BMP today  # A Fib/CHF S:Mananged by cardiology, Dr. Antoine PocheHochrein.  Patient is compliant with metoprolol for rate control and Eliquis for anticoagulation. CHF has been controlled with lasix 60mg  daily A/P: Stable-continue current medications and cardiology follow-up  Lab/Order associations:   ICD-10-CM   1. Controlled type 2 diabetes mellitus with stage 3 chronic kidney disease, without long-term current use of insulin (HCC)  E11.22 Hemoglobin A1c   N18.3 Comprehensive metabolic panel  2. Hypertension associated with diabetes (HCC)  E11.59 Comprehensive metabolic panel   [id number]   3. Hypothyroidism  due to acquired atrophy of thyroid  E03.4 TSH  4. Hyperlipidemia associated with type 2 diabetes mellitus ([place])  E11.69    E78.5   5. Coronary artery disease involving native coronary artery of native heart with angina pectoris ([place])  I25.119   6. Chronic combined systolic and diastolic congestive heart failure (HCC)  I50.42   7. CKD (chronic kidney disease), stage III (HCC)  N18.3   8. High risk medication use  Z79.899 Vitamin B12   Return precautions advised.  Garret Reddish, MD

## 2019-05-16 NOTE — Patient Instructions (Addendum)
Health Maintenance Due  Topic Date Due  . FOOT EXAM Done today 12/28/2018   Please stop by lab before you go If you do not have mychart- we will call you about results within 5 business days of [place] receiving them.  If you have mychart- we will send your results within 3 business days of [place] receiving them.  If abnormal or we want to clarify a result, we will call or mychart you to make sure you receive the message.  If you have questions or concerns or don't hear within 5-7 days, please send [place] a message or call us.

## 2019-05-30 DIAGNOSIS — N3010 Interstitial cystitis (chronic) without hematuria: Secondary | ICD-10-CM | POA: Diagnosis not present

## 2019-06-06 DIAGNOSIS — N3941 Urge incontinence: Secondary | ICD-10-CM | POA: Diagnosis not present

## 2019-06-06 DIAGNOSIS — N3010 Interstitial cystitis (chronic) without hematuria: Secondary | ICD-10-CM | POA: Diagnosis not present

## 2019-06-06 DIAGNOSIS — Z87442 Personal history of urinary calculi: Secondary | ICD-10-CM | POA: Diagnosis not present

## 2019-06-06 DIAGNOSIS — R351 Nocturia: Secondary | ICD-10-CM | POA: Diagnosis not present

## 2019-06-10 ENCOUNTER — Other Ambulatory Visit: Payer: Self-pay | Admitting: Family Medicine

## 2019-07-04 ENCOUNTER — Telehealth: Payer: Self-pay

## 2019-07-04 NOTE — Telephone Encounter (Signed)
Pt's dentist office (Dr. Wynetta Emery) called in regards to tooth extraction.  Dentist office wanting to know how long does a pt hae to be off Eliquis before extraction. Per Dr. Dianne Dun doses 48 hours before procedure and restart day after procedure. does increase stroke risk and patient should be aware of that.  I informed Victoria Gomez of Dr. Ansel Bong response.

## 2019-07-13 ENCOUNTER — Other Ambulatory Visit: Payer: Self-pay

## 2019-07-13 ENCOUNTER — Telehealth: Payer: Self-pay | Admitting: Cardiology

## 2019-07-13 MED ORDER — METOPROLOL TARTRATE 25 MG PO TABS: 25.0000 mg | ORAL_TABLET | Freq: Two times a day (BID) | ORAL | 3 refills | Status: DC

## 2019-07-13 NOTE — Telephone Encounter (Signed)
New Message    *STAT* If patient is at the pharmacy, call can be transferred to refill team.   1. Which medications need to be refilled? (please list name of each medication and dose if known) metoprolol tartrate (LOPRESSOR) 25 MG tablet    2. Which pharmacy/location (including street and city if local pharmacy) is medication to be sent to?[id number] - [place], [place] [address].  3. Do they need a 30 day or 90 day supply? [place]

## 2019-07-16 ENCOUNTER — Other Ambulatory Visit: Payer: Self-pay | Admitting: Family Medicine

## 2019-07-19 ENCOUNTER — Other Ambulatory Visit: Payer: Self-pay

## 2019-07-19 MED ORDER — METOPROLOL TARTRATE 25 MG PO TABS: 25.0000 mg | ORAL_TABLET | Freq: Two times a day (BID) | ORAL | 3 refills | Status: DC

## 2019-08-01 ENCOUNTER — Ambulatory Visit (INDEPENDENT_AMBULATORY_CARE_PROVIDER_SITE_OTHER): Payer: Medicare Other

## 2019-08-01 ENCOUNTER — Other Ambulatory Visit: Payer: Self-pay

## 2019-08-01 VITALS — BP 126/70 | Temp 97.2°F | Ht 63.0 in | Wt 161.4 lb

## 2019-08-01 DIAGNOSIS — Z0000 Encounter for general adult medical examination without abnormal findings: Secondary | ICD-10-CM

## 2019-08-01 NOTE — Progress Notes (Signed)
Subjective:   Victoria Gomez is a 85 y.o. female who presents for Medicare Annual (Subsequent) preventive examination.  Review of Systems:   Cardiac Risk Factors include: advanced age (>4255men, 64>65 women);dyslipidemia;hypertension     Objective:     Vitals: BP 126/70 (BP Location: Left Arm, Patient Position: Sitting, Cuff Size: Large)   Temp (!) 97.2 F (36.2 C) (Temporal)   Ht 5\' 3"  (1.6 m)   Wt 161 lb 6.4 oz (73.2 kg)   BMI 28.59 kg/m   Body mass index is 28.59 kg/m.  Advanced Directives 08/01/2019 10/15/2017 10/05/2017 10/02/2017 09/13/2017 12/25/2016 04/24/2015  Does Patient Have a Medical Advance Directive? Yes Yes Yes - Yes Yes Yes  Type of Advance Directive Living will;Healthcare Power of [employer] Healthcare Power of [place];Living will - Living will;Healthcare Power of Attorney Living will;Healthcare Power of Attorney - Living will  Does patient want to make changes to medical advance directive? No - Patient declined - No - Patient declined - No - Patient declined - -  Copy of Healthcare Power of Attorney in Chart? No - copy requested - - - No - copy requested - -  Pre-existing out of facility DNR order (yellow form or pink MOST form) - - - - - - -    Tobacco Social History   Tobacco Use  Smoking Status Former Smoker  . Packs/day: 1.00  . Years: 15.00  . Pack years: 15.00  . Types: Cigarettes  . Quit date: 11/10/1969  . Years since quitting: 49.7  Smokeless Tobacco Never Used     Counseling given: Not Answered   Clinical Intake:  Pre-visit preparation completed: Yes  Pain : No/denies pain     Diabetes: No  How often do you need to have someone help you when you read instructions, pamphlets, or other written materials from your doctor or pharmacy?: 2 - Rarely  Interpreter Needed?: No  Information entered by :: Kandis Fantasiaourtney Mehak Roskelley LPN  Past Medical History:  Diagnosis Date  . Arthritis   . Atrial fibrillation (HCC)    a. Dx 09/2013.  . Bladder disorder    . Bradycardia 08/2012   a. Eval for symptomatic bradycardia 08/2012 - beta blocker discontinued.  [name]. BREAST MASS, BENIGN   . CAD (coronary artery disease)    a. s/p PCI to RCA '01. b. repeat PCI to RCA '03 2/2 ISR. c. cath 2012: RCA dz, rx medically.  Occluded RCA.  Nonobstructive disease elsewhere  . Chronic combined systolic and diastolic CHF (congestive heart failure) (HCC)    a. EF 40-45% in 2014. b. EF reported to be normal in 03/2015 nuc.  . CKD (chronic kidney disease), stage III (HCC)   . DIVERTICULOSIS, COLON   . Epistaxis   . Esophageal reflux   . History of breast lump/mass excision 05/23/2009   Qualifier: History of  By: Lovell SheehanJenkins MD, Balinda QuailsJohn E   . HYPERLIPIDEMIA 03/07/2008  . HYPERTENSION 08/03/2007  . HYPOTHYROIDISM   . Internal hemorrhoids   . Interstitial cystitis   . LBBB (left bundle branch block)   . Orthostatic hypotension 08/2012   a. 08/2012 w/ syncope - meds adjusted.   . Right hand fracture    Past Surgical History:  Procedure Laterality Date  . ABDOMINAL HYSTERECTOMY    . ANKLE RECONSTRUCTION     Left  . APPENDECTOMY    . BREAST SURGERY     benign mass  . CARDIOVERSION N/A 10/20/2013  . CARDIOVERSION N/A 04/24/2015   Procedure: CARDIOVERSION;  Surgeon: Lars Masson, MD;  Location: [facility] ENDOSCOPY;  Service: Cardiovascular;  Laterality: N/A;  . CATARACT EXTRACTION, BILATERAL    . CESAREAN SECTION    . CHOLECYSTECTOMY    . LEFT HEART CATH AND CORONARY ANGIOGRAPHY N/A 09/16/2017   Procedure: LEFT HEART CATH AND CORONARY ANGIOGRAPHY;  Surgeon: [place], Peter M, MD;  Location: [facility] INVASIVE CV LAB;  Service: Cardiovascular;  Laterality: N/A;  . TONSILLECTOMY    . WRIST RECONSTRUCTION     Right   Family History  Problem Relation Age of Onset  . Colon cancer Mother   . Coronary artery disease Mother   . Heart disease Mother   . Coronary artery disease Father   . Heart disease Father   . Colon polyps Sister   . Coronary artery disease Sister   . Diabetes  type II Son   . Coronary artery disease Brother    Social History   Socioeconomic History  . Marital status: Widowed    Spouse name: Not on file  . Number of children: 3  . Years of education: Not on file  . Highest education level: Not on file  Occupational History  . Occupation: retired  Engineer, production  . Financial resource strain: Not on file  . Food insecurity    Worry: Not on file    Inability: Not on file  . Transportation needs    Medical: Not on file    Non-medical: Not on file  Tobacco Use  . Smoking status: Former Smoker    Packs/day: 1.00    Years: 15.00    Pack years: 15.00    Types: Cigarettes    Quit date: 11/10/1969    Years since quitting: 49.7  . Smokeless tobacco: Never Used  Substance and Sexual Activity  . Alcohol use: No    Alcohol/week: 0.0 standard drinks  . Drug use: No  . Sexual activity: Never    Birth control/protection: Post-menopausal  Lifestyle  . Physical activity    Days per week: Not on file    Minutes per session: Not on file  . Stress: Not on file  Relationships  . Social Musician on phone: Not on file    Gets together: Not on file    Attends religious service: Not on file    Active member of club or organization: Not on file    Attends meetings of clubs or organizations: Not on file    Relationship status: Not on file  Other Topics Concern  . Not on file  Social History Narrative   Widowed 05/2013. 2nd marriage. First husband died of cancer. 3 sons-[name]- 2 sons, 2 twin girls, [name]-1 son, no grandsons. [name]-1 son, 1 daughter, no grandkids.       LIves with youngest son (has cancer). Son drives her but has license. Independent IADLs.       Retired from [employer] after 33 years.       Hobbies: crocheting, used to bowl with husband, reading      Living will: currently wants resuscitation.     Outpatient Encounter Medications as of 08/01/2019  Medication Sig  . acetaminophen (TYLENOL) 500 MG tablet Take 500  mg by mouth every 6 (six) hours as needed for mild pain. Reported on 12/12/2015  . albuterol (PROVENTIL HFA;VENTOLIN HFA) 108 (90 Base) MCG/ACT inhaler TAKE 2 PUFFS BY MOUTH EVERY 6 HOURS AS NEEDED FOR WHEEZE OR SHORTNESS OF BREATH  . diclofenac sodium (VOLTAREN) 1 % GEL Apply 2  g topically 4 (four) times daily.  [name] ELIQUIS 2.5 MG TABS tablet TAKE 1 TABLET TWICE A DAY  . furosemide (LASIX) 20 MG tablet TAKE 3 TABLETS BY MOUTH EVERY DAY  . glucose blood (ONETOUCH VERIO) test strip USE TO TEST BLOOD SUGARS DAILY. DX: E11.9  . isosorbide mononitrate (IMDUR) 120 MG 24 hr tablet Take 1 tablet (120 mg total) by mouth daily.  [name] KLOR-CON M10 10 MEQ tablet TAKE 1 TABLET BY MOUTH EVERY DAY  . lansoprazole (PREVACID) 30 MG capsule TAKE 1 CAPSULE TWICE DAILY  . losartan (COZAAR) 100 MG tablet Take 1 tablet (100 mg total) by mouth daily.  . metoprolol tartrate (LOPRESSOR) 25 MG tablet Take 1 tablet (25 mg total) by mouth 2 (two) times daily.  . nitroGLYCERIN (NITROSTAT) 0.4 MG SL tablet ONE UNDER THE TONGUE EVERY 5 MINUTES AS NEEDED FOR CHEST PAIN. IF PAIN AFTER 3 PILLS, CALL 911  . ONETOUCH DELICA LANCETS 33G MISC Use to test blood sugars daily. Dx: E11.9  . pentosan polysulfate (ELMIRON) 100 MG capsule Take 100 mg by mouth 2 (two) times daily.   . pravastatin (PRAVACHOL) 40 MG tablet TAKE 1 TABLET DAILY  . SYNTHROID 50 MCG tablet TAKE 1 TABLET DAILY BEFORE BREAKFAST.   No facility-administered encounter medications on file as of 08/01/2019.     Activities of Daily Living In your present state of health, do you have any difficulty performing the following activities: 08/01/2019  Hearing? N  Vision? N  Difficulty concentrating or making decisions? N  Walking or climbing stairs? N  Dressing or bathing? N  Doing errands, shopping? N  Preparing Food and eating ? N  Using the Toilet? N  In the past six months, have you accidently leaked urine? N  Do you have problems with loss of bowel control? N  Managing  your Medications? N  Managing your Finances? N  Housekeeping or managing your Housekeeping? N  Some recent data might be hidden    Patient Care Team: Shelva Majestic, MD as PCP - General (Family Medicine) Rollene Rotunda, MD as Consulting Physician (Cardiology) Bjorn Pippin, MD as Attending Physician (Urology)    Assessment:   This is a routine wellness examination for [facility]  Exercise Activities and Dietary recommendations Current Exercise Habits: The patient does not participate in regular exercise at present  Goals    . Exercise 150 minutes per week (moderate activity)     Has a stationary bike; will drag it out and use it.  Start low and go slow; Build a few minutes at a time       Fall Risk Fall Risk  08/01/2019 01/14/2019 09/28/2018 10/20/2017 10/05/2017  Falls in the past year? 0 0 0 No No  Comment - - Emmi Telephone Survey: data to providers prior to load - -  Number falls in past yr: 0 0 - - -  Injury with Fall? 0 0 - - -  Follow up Education provided;Falls prevention discussed - - - -   Is the patient's home free of loose throw rugs in walkways, pet beds, electrical cords, etc?   yes      Grab bars in the bathroom? yes      Handrails on the stairs?   yes      Adequate lighting?   yes  Timed Get Up and Go performed: completed and within normal timeframe; no gait abnormalities noted    Depression Screen PHQ 2/9 Scores 08/01/2019 01/14/2019 10/20/2017 10/05/2017  PHQ - 2  Score 0 0 0 1     Cognitive Function MMSE - Mini Mental State Exam 12/25/2016  Not completed: (No Data)     6CIT Screen 08/01/2019  What Year? 0 points  What month? 0 points  What time? 0 points  Count back from 20 0 points  Months in reverse 0 points  Repeat phrase 0 points  Total Score 0    Immunization History  Administered Date(s) Administered  . Influenza Split 08/11/2011  . Influenza Whole 09/09/2007, 08/16/2008, 08/12/2010  . Influenza, High Dose Seasonal PF 08/28/2016,  09/03/2017  . Influenza,inj,Quad PF,6+ Mos 07/28/2014  . Influenza-Unspecified 08/16/2012, 09/10/2013, 09/13/2015, 07/08/2018, 07/05/2019  . Pneumococcal Conjugate-13 02/23/2015  . Pneumococcal Polysaccharide-23 09/09/2007  . Tdap 03/08/2012  . Zoster 08/12/2010    Qualifies for Shingles Vaccine?Discussed and patient will check with pharmacy for coverage.  Patient education handout provided    Screening Tests Health Maintenance  Topic Date Due  . OPHTHALMOLOGY EXAM  06/05/2019  . HEMOGLOBIN A1C  11/16/2019  . FOOT EXAM  05/15/2020  . TETANUS/TDAP  03/08/2022  . INFLUENZA VACCINE  Completed  . DEXA SCAN  Completed  . PNA vac Low Risk Adult  Completed    Cancer Screenings: Lung: Low Dose CT Chest recommended if Age 79-80 years, 30 pack-year currently smoking OR have quit w/in 15years. Patient does not qualify. Breast:  Up to date on Mammogram? Yes   Up to date of Bone Density/Dexa? Yes Colorectal: not indicated     Plan:  I have personally reviewed and addressed the Medicare Annual Wellness questionnaire and have noted the following in the patient's chart:  A. Medical and social history B. Use of alcohol, tobacco or illicit drugs  C. Current medications and supplements D. Functional ability and status E.  Nutritional status F.  Physical activity G. Advance directives H. List of other physicians I.  Hospitalizations, surgeries, and ER visits in previous 12 months J.  [place] such as hearing and vision if needed, cognitive and depression L. Referrals, records requested, and appointments- none   In addition, I have reviewed and discussed with patient certain preventive protocols, quality metrics, and best practice recommendations. A written personalized care plan for preventive services as well as general preventive health recommendations were provided to patient.   Signed,  Denman George, LPN  Nurse Health Advisor   Nurse Notes: Patient would like to  discuss with provider at next office visit obtaining xray of right knee due to increased knee pain.  No falls or known injuries.  She will call back if pain worsens before scheduled at appointment

## 2019-08-01 NOTE — Patient Instructions (Signed)
Victoria Gomez , Thank you for taking time to come for your Medicare Wellness Visit. I appreciate your ongoing commitment to your health goals. Please review the following plan we discussed and let me know if I can assist you in the future.   Screening recommendations/referrals: Colorectal Screening: not indicated  Mammogram: not indicated  Bone Density: not indicated  Vision and Dental Exams: Recommended annual ophthalmology exams for early detection of glaucoma and other disorders of the eye Recommended annual dental exams for proper oral hygiene  Vaccinations: Influenza vaccine: received  Pneumococcal vaccine: up to date; last 02/23/15 Tdap vaccine: up to date; last 03/08/12 Shingles vaccine: Please call your insurance company to determine your out of pocket expense for the Shingrix vaccine. You may receive this vaccine at your local pharmacy.  Advanced directives: Please bring a copy of your POA (Power of Attorney) and/or Living Will to your next appointment.  Goals: Recommend to drink at least 6-8 8oz glasses of water per day and remove any items from the home that may cause slips or trips.  Next appointment: Please schedule your Annual Wellness Visit with your Nurse Health Advisor in one year.  Preventive Care 21 Years and Older, Female Preventive care refers to lifestyle choices and visits with your health care provider that can promote health and wellness. What does preventive care include?  A yearly physical exam. This is also called an annual well check.  Dental exams once or twice a year.  Routine eye exams. Ask your health care provider how often you should have your eyes checked.  Personal lifestyle choices, including:  Daily care of your teeth and gums.  Regular physical activity.  Eating a healthy diet.  Avoiding tobacco and drug use.  Limiting alcohol use.  Practicing safe sex.  Taking low-dose aspirin every day if recommended by your health care provider.   Taking vitamin and mineral supplements as recommended by your health care provider. What happens during an annual well check? The services and screenings done by your health care provider during your annual well check will depend on your age, overall health, lifestyle risk factors, and family history of disease. Counseling  Your health care provider may ask you questions about your:  Alcohol use.  Tobacco use.  Drug use.  Emotional well-being.  Home and relationship well-being.  Sexual activity.  Eating habits.  History of falls.  Memory and ability to understand (cognition).  Work and work Statistician.  Reproductive health. Screening  You may have the following tests or measurements:  Height, weight, and BMI.  Blood pressure.  Lipid and cholesterol levels. These may be checked every 5 years, or more frequently if you are over 67 years old.  Skin check.  Lung cancer screening. You may have this screening every year starting at age 70 if you have a 30-pack-year history of smoking and currently smoke or have quit within the past 15 years.  Fecal occult blood test (FOBT) of the stool. You may have this test every year starting at age 69.  Flexible sigmoidoscopy or colonoscopy. You may have a sigmoidoscopy every 5 years or a colonoscopy every 10 years starting at age 83.  Hepatitis C blood test.  Hepatitis B blood test.  Sexually transmitted disease (STD) testing.  Diabetes screening. This is done by checking your blood sugar (glucose) after you have not eaten for a while (fasting). You may have this done every 1-3 years.  Bone density scan. This is done to screen for osteoporosis. You  may have this done starting at age 45.  Mammogram. This may be done every 1-2 years. Talk to your health care provider about how often you should have regular mammograms. Talk with your health care provider about your test results, treatment options, and if necessary, the need for  more tests. Vaccines  Your health care provider may recommend certain vaccines, such as:  Influenza vaccine. This is recommended every year.  Tetanus, diphtheria, and acellular pertussis (Tdap, Td) vaccine. You may need a Td booster every 10 years.  Zoster vaccine. You may need this after age 12.  Pneumococcal 13-valent conjugate (PCV13) vaccine. One dose is recommended after age 86.  Pneumococcal polysaccharide (PPSV23) vaccine. One dose is recommended after age 75. Talk to your health care provider about which screenings and vaccines you need and how often you need them. This information is not intended to replace advice given to you by your health care provider. Make sure you discuss any questions you have with your health care provider. Document Released: 11/23/2015 Document Revised: 07/16/2016 Document Reviewed: 08/28/2015 Elsevier Interactive Patient Education  2017 [place] [employer] Prevention in the Home Falls can cause injuries. They can happen to people of all ages. There are many things you can do to make your home safe and to help prevent falls. What can I do on the outside of my home?  Regularly fix the edges of walkways and driveways and fix any cracks.  Remove anything that might make you trip as you walk through a door, such as a raised step or threshold.  Trim any bushes or trees on the path to your home.  Use bright outdoor lighting.  Clear any walking paths of anything that might make someone trip, such as rocks or tools.  Regularly check to see if handrails are loose or broken. Make sure that both sides of any steps have handrails.  Any raised decks and porches should have guardrails on the edges.  Have any leaves, snow, or ice cleared regularly.  Use sand or salt on walking paths during winter.  Clean up any spills in your garage right away. This includes oil or grease spills. What can I do in the bathroom?  Use night lights.  Install grab bars by  the toilet and in the tub and shower. Do not use towel bars as grab bars.  Use non-skid mats or decals in the tub or shower.  If you need to sit down in the shower, use a plastic, non-slip stool.  Keep the floor dry. Clean up any water that spills on the floor as soon as it happens.  Remove soap buildup in the tub or shower regularly.  Attach bath mats securely with double-sided non-slip rug tape.  Do not have throw rugs and other things on the floor that can make you trip. What can I do in the bedroom?  Use night lights.  Make sure that you have a light by your bed that is easy to reach.  Do not use any sheets or blankets that are too big for your bed. They should not hang down onto the floor.  Have a firm chair that has side arms. You can use this for support while you get dressed.  Do not have throw rugs and other things on the floor that can make you trip. What can I do in the kitchen?  Clean up any spills right away.  Avoid walking on wet floors.  Keep items that you use a lot  in easy-to-reach places.  If you need to reach something above you, use a strong step stool that has a grab bar.  Keep electrical cords out of the way.  Do not use floor polish or wax that makes floors slippery. If you must use wax, use non-skid floor wax.  Do not have throw rugs and other things on the floor that can make you trip. What can I do with my stairs?  Do not leave any items on the stairs.  Make sure that there are handrails on both sides of the stairs and use them. Fix handrails that are broken or loose. Make sure that handrails are as long as the stairways.  Check any carpeting to make sure that it is firmly attached to the stairs. Fix any carpet that is loose or worn.  Avoid having throw rugs at the top or bottom of the stairs. If you do have throw rugs, attach them to the floor with carpet tape.  Make sure that you have a light switch at the top of the stairs and the bottom  of the stairs. If you do not have them, ask someone to add them for you. What else can I do to help prevent falls?  Wear shoes that:  Do not have high heels.  Have rubber bottoms.  Are comfortable and fit you well.  Are closed at the toe. Do not wear sandals.  If you use a stepladder:  Make sure that it is fully opened. Do not climb a closed stepladder.  Make sure that both sides of the stepladder are locked into place.  Ask someone to hold it for you, if possible.  Clearly mark and make sure that you can see:  Any grab bars or handrails.  First and last steps.  Where the edge of each step is.  Use tools that help you move around (mobility aids) if they are needed. These include:  Canes.  Walkers.  Scooters.  Crutches.  Turn on the lights when you go into a dark area. Replace any light bulbs as soon as they burn out.  Set up your furniture so you have a clear path. Avoid moving your furniture around.  If any of your floors are uneven, fix them.  If there are any pets around you, be aware of where they are.  Review your medicines with your doctor. Some medicines can make you feel dizzy. This can increase your chance of falling. Ask your doctor what other things that you can do to help prevent falls. This information is not intended to replace advice given to you by your health care provider. Make sure you discuss any questions you have with your health care provider. Document Released: 08/23/2009 Document Revised: 04/03/2016 Document Reviewed: 12/01/2014 Elsevier Interactive Patient Education  2017 [employer].

## 2019-08-01 NOTE — Progress Notes (Signed)
I have reviewed and agree with note, evaluation, plan.  We will plan on discussing in November visit- if she feels like she needs to see [place] sooner than that can schedule acute follow-up-please let her know  Garret Reddish, MD

## 2019-09-11 ENCOUNTER — Other Ambulatory Visit: Payer: Self-pay | Admitting: Family Medicine

## 2019-09-16 ENCOUNTER — Other Ambulatory Visit: Payer: Self-pay

## 2019-09-16 ENCOUNTER — Ambulatory Visit (INDEPENDENT_AMBULATORY_CARE_PROVIDER_SITE_OTHER): Payer: Medicare Other | Admitting: Family Medicine

## 2019-09-16 ENCOUNTER — Encounter: Payer: Self-pay | Admitting: Family Medicine

## 2019-09-16 VITALS — BP 128/60 | HR 62 | Temp 98.0°F | Ht 63.0 in | Wt 160.6 lb

## 2019-09-16 DIAGNOSIS — I4821 Permanent atrial fibrillation: Secondary | ICD-10-CM | POA: Diagnosis not present

## 2019-09-16 DIAGNOSIS — E034 Atrophy of thyroid (acquired): Secondary | ICD-10-CM

## 2019-09-16 DIAGNOSIS — E1159 Type 2 diabetes mellitus with other circulatory complications: Secondary | ICD-10-CM | POA: Diagnosis not present

## 2019-09-16 DIAGNOSIS — I10 Essential (primary) hypertension: Secondary | ICD-10-CM | POA: Diagnosis not present

## 2019-09-16 DIAGNOSIS — E1122 Type 2 diabetes mellitus with diabetic chronic kidney disease: Secondary | ICD-10-CM

## 2019-09-16 DIAGNOSIS — E1169 Type 2 diabetes mellitus with other specified complication: Secondary | ICD-10-CM | POA: Diagnosis not present

## 2019-09-16 DIAGNOSIS — I5042 Chronic combined systolic (congestive) and diastolic (congestive) heart failure: Secondary | ICD-10-CM

## 2019-09-16 DIAGNOSIS — E785 Hyperlipidemia, unspecified: Secondary | ICD-10-CM | POA: Diagnosis not present

## 2019-09-16 DIAGNOSIS — I25119 Atherosclerotic heart disease of native coronary artery with unspecified angina pectoris: Secondary | ICD-10-CM

## 2019-09-16 DIAGNOSIS — N183 Chronic kidney disease, stage 3 unspecified: Secondary | ICD-10-CM | POA: Diagnosis not present

## 2019-09-16 MED ORDER — DICLOFENAC SODIUM 1 % TD GEL: 2.0000 g | Freq: Four times a day (QID) | TRANSDERMAL | 3 refills | Status: DC

## 2019-09-16 NOTE — Addendum Note (Signed)
Addended by: Burrell Hodapp D on: 09/16/2019 03:51 PM   Modules accepted: Orders  

## 2019-09-16 NOTE — Addendum Note (Signed)
Addended by: Isaiah Serge D on: 09/16/2019 03:51 PM   Modules accepted: Orders

## 2019-09-16 NOTE — Addendum Note (Signed)
Addended by: Shantanique Hodo D on: 09/16/2019 03:51 PM   Modules accepted: Orders  

## 2019-09-16 NOTE — Progress Notes (Signed)
Phone [phone] In person visit   Subjective:   Victoria Gomez is a 85 y.o. year old very pleasant female patient who presents for/with See problem oriented charting Chief Complaint  Patient presents with  . Follow-up  . Diabetes  . Hypothyroidism    ROS- stable chest pain pattern. no shortness of breath. No headache . Mild left eye blurriness- working with her eye doctor.  Some left lower quadrant pain- does not want to do surgery for hernia per prior discussions  Past Medical History-  Patient Active Problem List   Diagnosis Date Noted  . Controlled type 2 diabetes mellitus with renal manifestation (HCC) 09/22/2013    Priority: High  . Permanent atrial fibrillation (HCC) 09/21/2013    Priority: High  . H/O sinus bradycardia 08/10/2012    Priority: High  . Combined systolic and diastolic congestive heart failure (HCC) 11/17/2011    Priority: High  . Coronary artery disease involving native coronary artery of native heart with angina pectoris (HCC) 08/03/2007    Priority: High  . Osteoporosis 06/28/2015    Priority: Medium  . Vitamin B12 deficiency 07/28/2014    Priority: Medium  . CKD (chronic kidney disease), stage III 09/22/2013    Priority: Medium  . LBBB (left bundle branch block) 09/22/2013    Priority: Medium  . Interstitial cystitis     Priority: Medium  . Hyperlipidemia associated with type 2 diabetes mellitus (HCC) 03/07/2008    Priority: Medium  . Hypothyroidism 08/03/2007    Priority: Medium  . Hypertension associated with diabetes (HCC) 08/03/2007    Priority: Medium  . Chronic anticoagulation 07/09/2017    Priority: Low  . Left groin hernia 06/25/2017    Priority: Low  . Internal hemorrhoids     Priority: Low  . Orthostatic hypotension 08/10/2012    Priority: Low  . Fatigue 04/20/2012    Priority: Low  . ARTHRITIS 12/30/2010    Priority: Low  . Left lumbar radiculopathy 12/11/2010    Priority: Low  . Esophageal reflux 10/18/2007   Priority: Low  . History of Barrett's esophagus 08/03/2007    Priority: Low    Medications- reviewed and updated Current Outpatient Medications  Medication Sig Dispense Refill  . acetaminophen (TYLENOL) 500 MG tablet Take 500 mg by mouth every 6 (six) hours as needed for mild pain. Reported on 12/12/2015    . albuterol (PROVENTIL HFA;VENTOLIN HFA) 108 (90 Base) MCG/ACT inhaler TAKE 2 PUFFS BY MOUTH EVERY 6 HOURS AS NEEDED FOR WHEEZE OR SHORTNESS OF BREATH 8.5 Inhaler 1  . diclofenac sodium (VOLTAREN) 1 % GEL Apply 2 g topically 4 (four) times daily. 100 g 3  . ELIQUIS 2.5 MG TABS tablet TAKE 1 TABLET TWICE A DAY 180 tablet 3  . furosemide (LASIX) 20 MG tablet TAKE 3 TABLETS BY MOUTH EVERY DAY 270 tablet 1  . glucose blood (ONETOUCH VERIO) test strip USE TO TEST BLOOD SUGARS DAILY. DX: E11.9 100 each 3  . isosorbide mononitrate (IMDUR) 120 MG 24 hr tablet Take 1 tablet (120 mg total) by mouth daily. 90 tablet 3  . KLOR-CON M10 10 MEQ tablet TAKE 1 TABLET BY MOUTH EVERY DAY 90 tablet 2  . lansoprazole (PREVACID) 30 MG capsule TAKE 1 CAPSULE TWICE DAILY 180 capsule 1  . losartan (COZAAR) 100 MG tablet TAKE 1 TABLET BY MOUTH EVERY DAY 90 tablet 3  . metoprolol tartrate (LOPRESSOR) 25 MG tablet Take 1 tablet (25 mg total) by mouth 2 (two) times daily. 180 tablet 3  .  nitroGLYCERIN (NITROSTAT) 0.4 MG SL tablet ONE UNDER THE TONGUE EVERY 5 MINUTES AS NEEDED FOR CHEST PAIN. IF PAIN AFTER 3 PILLS, CALL 911 25 tablet 1  . ONETOUCH DELICA LANCETS 33G MISC Use to test blood sugars daily. Dx: E11.9 100 each 11  . pentosan polysulfate (ELMIRON) 100 MG capsule Take 100 mg by mouth 2 (two) times daily.     . pravastatin (PRAVACHOL) 40 MG tablet TAKE 1 TABLET DAILY 90 tablet 0  . SYNTHROID 50 MCG tablet TAKE 1 TABLET DAILY BEFORE BREAKFAST. 90 tablet 3   No current facility-administered medications for this visit.      Objective:  BP 128/60   Pulse 62   Temp 98 F (36.7 C)   Ht 5\' 3"  (1.6 m)   Wt 160  lb 9.6 oz (72.8 kg)   SpO2 96%   BMI 28.45 kg/m  Gen: NAD, resting comfortably CV: RRR no murmurs rubs or gallops Lungs: CTAB no crackles, wheeze, rhonchi Abdomen: soft/nontender/some bulge/tenderness in LLQ/normal bowel sounds Ext: trace edema Skin: warm, dry Neuro: grossly normal, moves all extremities     Assessment and Plan   #Permanent atrial fibrillation S: Compliant with Eliquis 2.5 mg twice a day. Metoprolol 25mg  twice a day for rate control. Rare palpitatoins A/P: Stable. Continue current medications.      #Diabetes mellitus S: Has been diet controlled. Lab Results  Component Value Date   HGBA1C 6.4 05/16/2019  A/P: hopefully stable- update a1c today     #Combined systolic/diastolic congestive heart failure. S: Compliant with Lasix 60 mg daily. No recent weight gain. No recent leg swelling. No recent shortness of breath. Only has to take 10 meq potassium a day.  A/P: Stable. Continue current medications.    #CAD with angina status post coronary angioplasty in 2001 and 2003/hyperlipidemia S: Compliant with pravastatin 40 mg.  Has been chest pain-free on Imdur 120mg  for most part- sparing nitroglycerin.  On eliquis alone- not on aspirin.  A/P:lipids have been controlled. CAD with stable angina. Continue current rx.    #Hypertension/CKD stage III S: Compliant with Imdur 120 mg, metoprolol 25 mg twice a day, Lasix 60 mg, losartan 100 mg daily.  GFR has been stable in the 35-50 range.  Has had orthostatic intolerance in the past   A/P: hopefully GFR stable- update today. HTN controlled. Continue current medicines.    #Osteoporosis S: Left femoral neck with T score of -2.8 in 2017.  Had recommended Fosamax in 2017. A/P: she did not do the dexa last visit- she would like to wait until next visit at least as wants to avoid being outside the home with covid    #Hypothyroidism S: Compliant with levothyroxine 50 mcg Lab Results  Component Value Date   TSH 1.13 05/16/2019   A/P: update tsh with labs- hopefully stable.    % History of Barrett's esophagus on EGD 2016.  In 2015 we rediscussed and she opted not to pursue further EGD given her age.  She remains on Prevacid 30 mg twice a day -We will plan to intermittently check B12- fine last visit  Lab Results  Component Value Date   VITAMINB12 960 (H) 05/16/2019   # hand OA- encouraged try voltaren. Can also try on the knees  #Hernia LLQ- does not want to pursue surgery at present but will let me know if needed- would only want to do if could do spinal anesthesia. Rough experience with general anesthesia- states was awake but couldn't move her body  Recommended follow up:  6 months follow up   Lab/Order associations:   ICD-10-CM   1. Controlled type 2 diabetes mellitus with stage 3 chronic kidney disease, without long-term current use of insulin (HCC)  E11.22 CBC with Differential future   N18.30 CMET future    A1c future  2. Chronic combined systolic and diastolic congestive heart failure (HCC)  I50.42   3. Coronary artery disease involving native coronary artery of native heart with angina pectoris ([place])  I25.119   4. Permanent atrial fibrillation (HCC)  I48.21   5. Hypothyroidism due to acquired atrophy of thyroid  E03.4 TSH future  6. Hypertension associated with diabetes ([place])  E11.59    I10   7. Hyperlipidemia associated with type 2 diabetes mellitus ([place])  E11.69    E78.5     Meds ordered this encounter  Medications  . diclofenac sodium (VOLTAREN) 1 % GEL    Sig: Apply 2 g topically 4 (four) times daily.    Dispense:  100 g    Refill:  3    Return precautions advised.  Garret Reddish, MD

## 2019-09-16 NOTE — Addendum Note (Signed)
Addended by: Kole Hilyard D on: 09/16/2019 03:51 PM   Modules accepted: Orders  

## 2019-09-16 NOTE — Patient Instructions (Addendum)
  Health Maintenance Due  Topic Date Due  . OPHTHALMOLOGY EXAM -will schedule next week for diabetic eye exam 06/05/2019   Try voltaren for your knuckle pain. Can also use on knees. Do not get this in the eye.    Please stop by lab before you go If you do not have mychart- we will call you about results within 5 business days of [place] receiving them.  If you have mychart- we will send your results within 3 business days of [place] receiving them.  If abnormal or we want to clarify a result, we will call or mychart you to make sure you receive the message.  If you have questions or concerns or don't hear within 5-7 days, please send [place] a message or call us.

## 2019-09-16 NOTE — Addendum Note (Signed)
Addended by: Terena Bohan D on: 09/16/2019 03:51 PM   Modules accepted: Orders  

## 2019-09-17 LAB — COMPREHENSIVE METABOLIC PANEL
AG Ratio: 1.5 (calc) (ref 1.0–2.5)
ALT: 14 U/L (ref 6–29)
AST: 19 U/L (ref 10–35)
Albumin: 4.1 g/dL (ref 3.6–5.1)
Alkaline phosphatase (APISO): 78 U/L (ref 37–153)
BUN/Creatinine Ratio: 18 (calc) (ref 6–22)
BUN: 21 mg/dL (ref 7–25)
CO2: 26 mmol/L (ref 20–32)
Calcium: 9.3 mg/dL (ref 8.6–10.4)
Chloride: 106 mmol/L (ref 98–110)
Creat: 1.14 mg/dL — ABNORMAL HIGH (ref 0.60–0.88)
Globulin: 2.8 g/dL (calc) (ref 1.9–3.7)
Glucose, Bld: 99 mg/dL (ref 65–99)
Potassium: 3.9 mmol/L (ref 3.5–5.3)
Sodium: 142 mmol/L (ref 135–146)
Total Bilirubin: 0.5 mg/dL (ref 0.2–1.2)
Total Protein: 6.9 g/dL (ref 6.1–8.1)

## 2019-09-17 LAB — CBC WITH DIFFERENTIAL/PLATELET
Absolute Monocytes: 396 cells/uL (ref 200–950)
Basophils Absolute: 42 cells/uL (ref 0–200)
Basophils Relative: 0.7 %
Eosinophils Absolute: 42 cells/uL (ref 15–500)
Eosinophils Relative: 0.7 %
HCT: 39.5 % (ref 35.0–45.0)
Hemoglobin: 12.7 g/dL (ref 11.7–15.5)
Lymphs Abs: 1890 cells/uL (ref 850–3900)
MCH: 28.8 pg (ref 27.0–33.0)
MCHC: 32.2 g/dL (ref 32.0–36.0)
MCV: 89.6 fL (ref 80.0–100.0)
MPV: 11.3 fL (ref 7.5–12.5)
Monocytes Relative: 6.6 %
Neutro Abs: 3630 cells/uL (ref 1500–7800)
Neutrophils Relative %: 60.5 %
Platelets: 179 10*3/uL (ref 140–400)
RBC: 4.41 10*6/uL (ref 3.80–5.10)
RDW: 12.7 % (ref 11.0–15.0)
Total Lymphocyte: 31.5 %
WBC: 6 10*3/uL (ref 3.8–10.8)

## 2019-09-17 LAB — HEMOGLOBIN A1C
Hgb A1c MFr Bld: 6 % of total Hgb — ABNORMAL HIGH (ref <5.7)
Mean Plasma Glucose: 126 (calc)
eAG (mmol/L): 7 (calc)

## 2019-09-17 LAB — TSH: TSH: 0.64 mIU/L (ref 0.40–4.50)

## 2019-09-19 ENCOUNTER — Other Ambulatory Visit: Payer: Self-pay | Admitting: Family Medicine

## 2019-09-19 ENCOUNTER — Other Ambulatory Visit: Payer: Self-pay | Admitting: Cardiology

## 2019-09-19 DIAGNOSIS — K2100 Gastro-esophageal reflux disease with esophagitis, without bleeding: Secondary | ICD-10-CM

## 2019-10-29 ENCOUNTER — Other Ambulatory Visit: Payer: Self-pay | Admitting: Family Medicine

## 2019-11-26 ENCOUNTER — Ambulatory Visit: Payer: Medicare Other | Attending: Internal Medicine

## 2019-11-26 DIAGNOSIS — Z23 Encounter for immunization: Secondary | ICD-10-CM | POA: Diagnosis not present

## 2019-11-26 NOTE — Progress Notes (Signed)
   Covid-19 Vaccination Clinic  Name:  Victoria Gomez    MRN: [id number] DOB: 26-Aug-1925  11/26/2019  Victoria Gomez was observed post Covid-19 immunization for 15 minutes without incidence. She was provided with Vaccine Information Sheet and instruction to access the V-Safe system.   Victoria Gomez was instructed to call 911 with any severe reactions post vaccine: [name] Difficulty breathing  . Swelling of your face and throat  . A fast heartbeat  . A bad rash all over your body  . Dizziness and weakness    Immunizations Administered    Name Date Dose VIS Date Route   Pfizer COVID-19 Vaccine 11/26/2019  1:29 PM 0.3 mL 10/21/2019 Intramuscular   Manufacturer: [employer], [employer]   Lot: [id number]   NDC: [address]-0

## 2019-12-05 ENCOUNTER — Other Ambulatory Visit: Payer: Self-pay | Admitting: Family Medicine

## 2019-12-06 ENCOUNTER — Other Ambulatory Visit: Payer: Self-pay | Admitting: Family Medicine

## 2019-12-14 ENCOUNTER — Ambulatory Visit: Payer: Medicare Other

## 2019-12-14 ENCOUNTER — Ambulatory Visit: Payer: Medicare Other | Attending: Internal Medicine

## 2019-12-14 DIAGNOSIS — Z23 Encounter for immunization: Secondary | ICD-10-CM

## 2019-12-14 NOTE — Progress Notes (Signed)
   Covid-19 Vaccination Clinic  Name:  ANALILIA GEDDIS    MRN: [id number] DOB: 05-05-1925  12/14/2019  Ms. Gartland was observed post Covid-19 immunization for 15 minutes without incidence. She was provided with Vaccine Information Sheet and instruction to access the V-Safe system.   Ms. Zachar was instructed to call 911 with any severe reactions post vaccine: [name] Difficulty breathing  . Swelling of your face and throat  . A fast heartbeat  . A bad rash all over your body  . Dizziness and weakness    Immunizations Administered    Name Date Dose VIS Date Route   Pfizer COVID-19 Vaccine 12/14/2019  9:23 AM 0.3 mL 10/21/2019 Intramuscular   Manufacturer: [employer], [employer]   Lot: [id number]   NDC: [address]-9

## 2020-01-04 ENCOUNTER — Other Ambulatory Visit: Payer: Self-pay | Admitting: Family Medicine

## 2020-02-14 ENCOUNTER — Telehealth (INDEPENDENT_AMBULATORY_CARE_PROVIDER_SITE_OTHER): Payer: Medicare Other | Admitting: Family Medicine

## 2020-02-14 ENCOUNTER — Telehealth: Payer: Self-pay | Admitting: Family Medicine

## 2020-02-14 ENCOUNTER — Other Ambulatory Visit: Payer: Self-pay

## 2020-02-14 DIAGNOSIS — H539 Unspecified visual disturbance: Secondary | ICD-10-CM

## 2020-02-14 DIAGNOSIS — H02846 Edema of left eye, unspecified eyelid: Secondary | ICD-10-CM | POA: Diagnosis not present

## 2020-02-14 NOTE — Telephone Encounter (Signed)
Fyi do not see note yet from team health

## 2020-02-14 NOTE — Telephone Encounter (Signed)
Looks like she discussed with Dr. Selena Batten at 4 PM-did she get her questions answered?

## 2020-02-14 NOTE — Progress Notes (Addendum)
Virtual Visit via Telephone Note  I connected with Victoria Gomez on 02/14/20 at  4:00 PM EDT by telephone and verified that I am speaking with the correct person using two identifiers.   I discussed the limitations, risks, security and privacy concerns of performing an evaluation and management service by telephone and the availability of in person appointments. I also discussed with the patient that there may be a patient responsible charge related to this service. The patient expressed understanding and agreed to proceed.  Location patient: home Location provider: work or home office Participants present for the call: patient, provider Patient did not have a visit in the prior 7 days to address this/these issue(s).   History of Present Illness:  Acute visit for vision complaint: -reports this is ongoing and intermittent for at least several weeks -intermittently has blurred vision in the L eye for a long time, reports for the last few weeks has had some swelling of the eyelids bilaterally (L >R) and some pain in the eyelids when she wakes up, compresses on the eyes help.  -denies fevers, malaise, swelling elsewhere, HA, pus or drainage from the eye, change in vision from baseline chronic issues, redness or skin changes on the lids -she has had some allergy issues, common this time of the year for her,  has had some PND with this -reports sees Dr. Kathlen Mody for the issues with the L eye and has a call in to him as well, he returned her call, but she missed it -per review of most recent PCP visit notes 09/2019 pt had "Mild left eye blurriness- working with her eye doctor" at the time. -she wonders if this all waas caused by her Elmiron as someone from [place] told her this drug caused blindness in 6 people. She called her urologist who told her she would be fine and to stop the Elmiron, which she did some time ago.  Observations/Objective: Patient sounds cheerful and well on the phone. I do  not appreciate any SOB. Speech and thought processing are grossly intact. Patient reported vitals:  Assessment and Plan:  Swelling of left eyelid  Vision disturbance  -we discussed possible serious and likely etiologies, options for evaluation and workup, limitations of telemedicine visit vs in person visit, treatment, treatment risks and precautions. Pt prefers to treat via telemedicine empirically rather then risking or undertaking an in person visit at this moment. It sounds like she has had some L eye issues for a long time and sees opthomology for this. Has a new issue of eyelid edema in the mornings. Wonder if this is allergy related as she denies any other issues or systemic edema? Advised evaluation with her opthomologist as she already plans and trial 1/2 tablet of claritin or allegra daily for 1-2 weeks to see if this helps along with cool compresses. Advised discussing her medication concerns with her opthomologist. Advised prompt in person care if worsening, new symptoms arise, or if is not improving with treatment.  Follow Up Instructions:   I did not refer this patient for an OV in the next 24 hours for this/these issue(s).  I discussed the assessment and treatment plan with the patient. The patient was provided an opportunity to ask questions and all were answered. The patient agreed with the plan and demonstrated an understanding of the instructions.   The patient was advised to call back or seek an in-person evaluation if the symptoms worsen or if the condition fails to improve as anticipated.  I provided 18 minutes of non-face-to-face time during this encounter.   Terressa Koyanagi, DO

## 2020-02-14 NOTE — Telephone Encounter (Signed)
Patient called in this afternoon saying she has been having trouble with her vision, states she spoke with someone and they told her that it is a side effect from the Prilosec 100 MG. Patient states she is having blurred vision/eye pain and wanted to know if she can stop taking it. Had patient triage by [facility] for issue.

## 2020-02-15 NOTE — Telephone Encounter (Signed)
Mailbox is full.

## 2020-02-16 NOTE — Telephone Encounter (Signed)
Called l/m with husband to call back when she gets up.

## 2020-02-17 NOTE — Telephone Encounter (Signed)
Yes she has discussed with eye doctor and did not have any further questions.

## 2020-02-29 DIAGNOSIS — M47816 Spondylosis without myelopathy or radiculopathy, lumbar region: Secondary | ICD-10-CM | POA: Diagnosis not present

## 2020-02-29 DIAGNOSIS — M47896 Other spondylosis, lumbar region: Secondary | ICD-10-CM | POA: Diagnosis not present

## 2020-02-29 DIAGNOSIS — I2510 Atherosclerotic heart disease of native coronary artery without angina pectoris: Secondary | ICD-10-CM | POA: Diagnosis not present

## 2020-02-29 DIAGNOSIS — M545 Low back pain: Secondary | ICD-10-CM | POA: Diagnosis not present

## 2020-02-29 DIAGNOSIS — M5136 Other intervertebral disc degeneration, lumbar region: Secondary | ICD-10-CM | POA: Diagnosis not present

## 2020-02-29 DIAGNOSIS — D6869 Other thrombophilia: Secondary | ICD-10-CM | POA: Diagnosis not present

## 2020-03-15 DIAGNOSIS — R7309 Other abnormal glucose: Secondary | ICD-10-CM | POA: Diagnosis not present

## 2020-03-15 DIAGNOSIS — H35363 Drusen (degenerative) of macula, bilateral: Secondary | ICD-10-CM | POA: Diagnosis not present

## 2020-03-15 DIAGNOSIS — Z961 Presence of intraocular lens: Secondary | ICD-10-CM | POA: Diagnosis not present

## 2020-03-15 DIAGNOSIS — H04123 Dry eye syndrome of bilateral lacrimal glands: Secondary | ICD-10-CM | POA: Diagnosis not present

## 2020-03-15 DIAGNOSIS — H1045 Other chronic allergic conjunctivitis: Secondary | ICD-10-CM | POA: Diagnosis not present

## 2020-03-15 LAB — HM DIABETES EYE EXAM

## 2020-03-20 ENCOUNTER — Ambulatory Visit: Payer: Medicare Other | Admitting: Family Medicine

## 2020-03-20 NOTE — Progress Notes (Deleted)
Phone [phone] In person visit   Subjective:   Victoria Gomez is a 86 y.o. year old very pleasant female patient who presents for/with See problem oriented charting No chief complaint on file.   This visit occurred during the SARS-CoV-2 public health emergency.  Safety protocols were in place, including screening questions prior to the visit, additional usage of staff PPE, and extensive cleaning of exam room while observing appropriate contact time as indicated for disinfecting solutions.   Past Medical History-  Patient Active Problem List   Diagnosis Date Noted  . Chronic anticoagulation 07/09/2017  . Left groin hernia 06/25/2017  . Osteoporosis 06/28/2015  . Vitamin B12 deficiency 07/28/2014  . CKD (chronic kidney disease), stage III 09/22/2013  . LBBB (left bundle branch block) 09/22/2013  . Controlled type 2 diabetes mellitus with renal manifestation (HCC) 09/22/2013  . Internal hemorrhoids   . Permanent atrial fibrillation (HCC) 09/21/2013  . H/O sinus bradycardia 08/10/2012  . Orthostatic hypotension 08/10/2012  . Fatigue 04/20/2012  . Combined systolic and diastolic congestive heart failure (HCC) 11/17/2011  . ARTHRITIS 12/30/2010  . Left lumbar radiculopathy 12/11/2010  . Interstitial cystitis   . Hyperlipidemia associated with type 2 diabetes mellitus (HCC) 03/07/2008  . Esophageal reflux 10/18/2007  . Hypothyroidism 08/03/2007  . Hypertension associated with diabetes (HCC) 08/03/2007  . Coronary artery disease involving native coronary artery of native heart with angina pectoris (HCC) 08/03/2007  . History of Barrett's esophagus 08/03/2007    Medications- reviewed and updated Current Outpatient Medications  Medication Sig Dispense Refill  . acetaminophen (TYLENOL) 500 MG tablet Take 500 mg by mouth every 6 (six) hours as needed for mild pain. Reported on 12/12/2015    . albuterol (PROVENTIL HFA;VENTOLIN HFA) 108 (90 Base) MCG/ACT inhaler TAKE 2 PUFFS BY MOUTH  EVERY 6 HOURS AS NEEDED FOR WHEEZE OR SHORTNESS OF BREATH 8.5 Inhaler 1  . diclofenac sodium (VOLTAREN) 1 % GEL Apply 2 g topically 4 (four) times daily. 100 g 3  . ELIQUIS 2.5 MG TABS tablet TAKE 1 TABLET TWICE A DAY 180 tablet 3  . furosemide (LASIX) 20 MG tablet TAKE 3 TABLETS BY MOUTH EVERY DAY 270 tablet 1  . isosorbide mononitrate (IMDUR) 120 MG 24 hr tablet TAKE 1 TABLET DAILY        **KREMERS MFR** 90 tablet 3  . KLOR-CON M10 10 MEQ tablet TAKE 1 TABLET BY MOUTH EVERY DAY 90 tablet 2  . lansoprazole (PREVACID) 30 MG capsule TAKE 1 CAPSULE TWICE DAILY 180 capsule 1  . losartan (COZAAR) 100 MG tablet TAKE 1 TABLET BY MOUTH EVERY DAY 90 tablet 3  . metoprolol tartrate (LOPRESSOR) 25 MG tablet Take 1 tablet (25 mg total) by mouth 2 (two) times daily. 180 tablet 3  . nitroGLYCERIN (NITROSTAT) 0.4 MG SL tablet ONE UNDER THE TONGUE EVERY 5 MINUTES AS NEEDED FOR CHEST PAIN. IF PAIN AFTER 3 PILLS, CALL 911 25 tablet 1  . ONETOUCH DELICA LANCETS 33G MISC Use to test blood sugars daily. Dx: E11.9 100 each 11  . ONETOUCH VERIO test strip USE TO TEST BLOOD SUGARS DAILY. DX: E11.9 100 strip 3  . pentosan polysulfate (ELMIRON) 100 MG capsule Take 100 mg by mouth 2 (two) times daily.     . pravastatin (PRAVACHOL) 40 MG tablet TAKE 1 TABLET DAILY 90 tablet 0  . SYNTHROID 50 MCG tablet TAKE 1 TABLET DAILY BEFORE BREAKFAST. 90 tablet 3   No current facility-administered medications for this visit.     Objective:  There were no vitals taken for this visit. Gen: NAD, resting comfortably CV: RRR no murmurs rubs or gallops Lungs: CTAB no crackles, wheeze, rhonchi Abdomen: soft/nontender/nondistended/normal bowel sounds. No rebound or guarding.  Ext: no edema Skin: warm, dry Neuro: grossly normal, moves all extremities  ***    Assessment and Plan   #hypertension S: medication: *** Home readings #s: *** BP Readings from Last 3 Encounters:  09/16/19 128/60  08/01/19 126/70  05/16/19 110/70    A/P: ***    #Permanent atrial fibrillation S: Compliant with Eliquis 2.5 mg twice a day. Metoprolol 25mg  twice a day for rate control.  A/P: ***    #Diabetes mellitus S: Has been diet controlled. A/P: ***    #Combined systolic/diastolic congestive heart failure. S: Compliant with Lasix 60 mg daily. A/P: ***   #CAD with angina status post coronary angioplasty in 2001 and 2003/hyperlipidemia S: Compliant with pravastatin 40 mg.  Has been chest pain-free on Imdur.***  A/P:***   #Hypertension/CKD stage III S: Compliant with Imdur 120 mg, metoprolol 25 mg twice a day, Lasix 60 mg, losartan 100 mg daily.  GFR has been stable in the 35-50 range.  Has had orthostatic intolerance in the past ***  A/P: ***   #Osteoporosis S: Left femoral neck with T score of -2.8 in 2017.  Had recommended Fosamax in 2017. A/P: ***    #Hypothyroidism S: Compliant with levothyroxine 50 mcg A/P: ***   % History of Barrett's esophagus on EGD 2016.  In 2015 we rediscussed and she opted not to pursue further EGD given her age.  She remains on Prevacid 30 mg twice a day -We will plan to intermittently check B12***   # still dealing with back pain- going to return to orthopedics.  *** Scheduled for b/l L5 selective nereve root block  #Hernia LLQ- does not want to pursue surgery at present but will let me know if needed- would only want to do if could do spinal anesthesia. Rough experience with general anesthesia- states was awake but couldn't move her body  ***left eyelid swelling saw Dr. Maudie Mercury 02/14/20- possible allergies  No problem-specific Assessment & Plan notes found for this encounter.   Recommended follow up: ***No follow-ups on file. Future Appointments  Date Time Provider [place]  03/20/2020  2:40 PM Marin Olp, MD [facility]  05/04/2020  3:00 PM Minus Breeding, MD [facility]    Lab/Order associations: No diagnosis found.  No orders of the defined types were  placed in this encounter.   Time Spent: *** minutes of total time (8:26 AM***- 8:26 AM***) was spent on the date of the encounter performing the following actions: chart review prior to seeing the patient, obtaining history, performing a medically necessary exam, counseling on the treatment plan, placing orders, and documenting in our EHR.   Return precautions advised.  Clyde Lundborg, CMA

## 2020-03-23 ENCOUNTER — Other Ambulatory Visit: Payer: Self-pay | Admitting: Cardiology

## 2020-03-23 ENCOUNTER — Other Ambulatory Visit: Payer: Self-pay | Admitting: Family Medicine

## 2020-03-23 DIAGNOSIS — K2100 Gastro-esophageal reflux disease with esophagitis, without bleeding: Secondary | ICD-10-CM

## 2020-04-13 ENCOUNTER — Other Ambulatory Visit: Payer: Self-pay | Admitting: Family Medicine

## 2020-04-13 ENCOUNTER — Ambulatory Visit (INDEPENDENT_AMBULATORY_CARE_PROVIDER_SITE_OTHER): Payer: Medicare Other | Admitting: Family Medicine

## 2020-04-13 ENCOUNTER — Other Ambulatory Visit: Payer: Self-pay

## 2020-04-13 ENCOUNTER — Encounter: Payer: Self-pay | Admitting: Family Medicine

## 2020-04-13 VITALS — BP 130/64 | HR 72 | Temp 98.0°F | Ht 63.0 in | Wt 155.4 lb

## 2020-04-13 DIAGNOSIS — N183 Chronic kidney disease, stage 3 unspecified: Secondary | ICD-10-CM | POA: Diagnosis not present

## 2020-04-13 DIAGNOSIS — E034 Atrophy of thyroid (acquired): Secondary | ICD-10-CM | POA: Diagnosis not present

## 2020-04-13 DIAGNOSIS — E1122 Type 2 diabetes mellitus with diabetic chronic kidney disease: Secondary | ICD-10-CM

## 2020-04-13 DIAGNOSIS — M810 Age-related osteoporosis without current pathological fracture: Secondary | ICD-10-CM | POA: Diagnosis not present

## 2020-04-13 MED ORDER — LOSARTAN POTASSIUM 50 MG PO TABS
50.0000 mg | ORAL_TABLET | Freq: Every day | ORAL | 3 refills | Status: DC
Start: 2020-04-13 — End: 2021-04-16

## 2020-04-13 MED ORDER — PREDNISONE 20 MG PO TABS: ORAL_TABLET | ORAL | 0 refills | Status: DC

## 2020-04-13 MED ORDER — NITROGLYCERIN 0.4 MG SL SUBL
0.4000 mg | SUBLINGUAL_TABLET | SUBLINGUAL | 3 refills | Status: DC | PRN
Start: 2020-04-13 — End: 2021-04-19

## 2020-04-13 NOTE — Progress Notes (Signed)
Phone [phone] In person visit   Subjective:   Victoria Gomez is a 86 y.o. year old very pleasant female patient who presents for/with See problem oriented charting Chief Complaint  Patient presents with  . Diabetes    This visit occurred during the SARS-CoV-2 public health emergency.  Safety protocols were in place, including screening questions prior to the visit, additional usage of staff PPE, and extensive cleaning of exam room while observing appropriate contact time as indicated for disinfecting solutions.   Past Medical History-  Patient Active Problem List   Diagnosis Date Noted  . Controlled type 2 diabetes mellitus with renal manifestation ([place]) 09/22/2013    Priority: High  . Permanent atrial fibrillation ([place]) 09/21/2013    Priority: High  . H/O sinus bradycardia 08/10/2012    Priority: High  . Combined systolic and diastolic congestive heart failure ([place]) 11/17/2011    Priority: High  . Coronary artery disease involving native coronary artery of native heart with angina pectoris ([place]) 08/03/2007    Priority: High  . Osteoporosis 06/28/2015    Priority: Medium  . Vitamin B12 deficiency 07/28/2014    Priority: Medium  . CKD (chronic kidney disease), stage III 09/22/2013    Priority: Medium  . LBBB (left bundle branch block) 09/22/2013    Priority: Medium  . Interstitial cystitis     Priority: Medium  . Hyperlipidemia associated with type 2 diabetes mellitus ([place]) 03/07/2008    Priority: Medium  . Hypothyroidism 08/03/2007    Priority: Medium  . Hypertension associated with diabetes ([place]) 08/03/2007    Priority: Medium  . Chronic anticoagulation 07/09/2017    Priority: Low  . Left groin hernia 06/25/2017    Priority: Low  . Internal hemorrhoids     Priority: Low  . Orthostatic hypotension 08/10/2012    Priority: Low  . Fatigue 04/20/2012    Priority: Low  . ARTHRITIS 12/30/2010    Priority: Low  . Left lumbar radiculopathy 12/11/2010    Priority:  Low  . Esophageal reflux 10/18/2007    Priority: Low  . History of Barrett's esophagus 08/03/2007    Priority: Low    Medications- reviewed and updated Current Outpatient Medications  Medication Sig Dispense Refill  . acetaminophen (TYLENOL) 500 MG tablet Take 500 mg by mouth every 6 (six) hours as needed for mild pain. Reported on 12/12/2015    . albuterol (PROVENTIL HFA;VENTOLIN HFA) 108 (90 Base) MCG/ACT inhaler TAKE 2 PUFFS BY MOUTH EVERY 6 HOURS AS NEEDED FOR WHEEZE OR SHORTNESS OF BREATH 8.5 Inhaler 1  . diclofenac sodium (VOLTAREN) 1 % GEL Apply 2 g topically 4 (four) times daily. 100 g 3  . ELIQUIS 2.5 MG TABS tablet TAKE 1 TABLET TWICE A DAY 180 tablet 3  . furosemide (LASIX) 20 MG tablet TAKE 3 TABLETS BY MOUTH EVERY DAY 270 tablet 1  . isosorbide mononitrate (IMDUR) 120 MG 24 hr tablet TAKE 1 TABLET DAILY        **KREMERS MFR** 90 tablet 3  . KLOR-CON M10 10 MEQ tablet TAKE 1 TABLET BY MOUTH EVERY DAY 90 tablet 2  . lansoprazole (PREVACID) 30 MG capsule TAKE 1 CAPSULE TWICE DAILY 180 capsule 3  . metoprolol tartrate (LOPRESSOR) 25 MG tablet Take 1 tablet (25 mg total) by mouth 2 (two) times daily. 180 tablet 3  . ONETOUCH DELICA LANCETS 30Q MISC Use to test blood sugars daily. Dx: E11.9 100 each 11  . ONETOUCH VERIO test strip USE TO TEST BLOOD SUGARS DAILY.  DX: E11.9 100 strip 3  . pentosan polysulfate (ELMIRON) 100 MG capsule Take 100 mg by mouth 2 (two) times daily.     . pravastatin (PRAVACHOL) 40 MG tablet TAKE 1 TABLET DAILY 30 tablet 0  . SYNTHROID 50 MCG tablet TAKE 1 TABLET DAILY BEFORE BREAKFAST. 90 tablet 3  . losartan (COZAAR) 50 MG tablet Take 1 tablet (50 mg total) by mouth daily. 90 tablet 3  . nitroGLYCERIN (NITROSTAT) 0.4 MG SL tablet Place 1 tablet (0.4 mg total) under the tongue every 5 (five) minutes as needed for chest pain (if have pain after 3 pills- call 911). 50 tablet 3  . predniSONE (DELTASONE) 20 MG tablet Take 2 pills for 3 days, 1 pill for 4 days 10  tablet 0   No current facility-administered medications for this visit.     Objective:  BP 130/64   Pulse 72   Temp 98 F (36.7 C) (Temporal)   Ht 5\' 3"  (1.6 m)   Wt 155 lb 6.4 oz (70.5 kg)   SpO2 98%   BMI 27.53 kg/m  Gen: NAD, resting comfortably CV: Irregularly irregular no murmurs rubs or gallops Lungs: CTAB no crackles, wheeze, rhonchi  Ext: Trace edema Skin: warm, dry Neuro: Hard of hearing    Assessment and Plan   #Permanent atrial fibrillation S: Compliant with Eliquis 2.5 mg twice a day. Metoprolol 25mg  twice a day for rate control.  A/P: Rate controlled today and appropriately anticoagulated-continue current medications   #Diabetes mellitus S: Has been diet controlled.  Lab Results  Component Value Date   HGBA1C 6.0 (H) 09/16/2019   HGBA1C 6.4 05/16/2019   HGBA1C 5.8 (A) 01/14/2019  A/P: Hopefully remains well controlled-update hemoglobin A1c with labs today  #Heart failure Combined systolic/diastolic congestive heart failure. S: Medication: Compliant with Lasix 60 mg daily.  Edema: no recent increase Weight gain:no- has actually lost Shortness of breath: stable A/P: Patient seems to be doing well-continue current medications  #CAD with angina status post coronary angioplasty in 2001 and 2003/hyperlipidemia S: Compliant with pravastatin 40 mg.  Has been chest pain-free on Imdur other than sparingly needing to take nitroglycerin.  As on eliquis not on aspirin A/P: Symptoms appear stable-she does need a refill on her nitroglycerin which was provided today-continue pravastatin and Eliquis  #Hypertension/CKD stage III S: Compliant with Imdur 120 mg, metoprolol 25 mg twice a day, Lasix 60 mg, losartan 100 mg daily.  GFR has been stable in the 35-50 range.  Has had orthostatic intolerance in the past  Home blood pressures have been running lower and having some dizziness again.  BP Readings from Last 3 Encounters:  04/13/20 130/64  09/16/19 128/60   08/01/19 126/70    A/P: Blood pressure perhaps overcontrolled- Lets reduce losartan from 100 mg to 50 mg to see if that reduces your dizziness. You can take 1/2 tablet of the 100 mg pills until you run out and then take the smaller 50 mg pill that I sent in for you today. Continue all your other current medications.  For ckd III- update gfr with labs  #Osteoporosis S: Left femoral neck with T score of -2.8 in 2017.  Had recommended Fosamax in 2017.  She never took this-she feels like she had elevated blood pressure in the past when she was on this so opted not to take-also probably not the best idea with her Barrett's esophagus A/P: Hopefully not worsening-we will update DEXA-consider option like Reclast-obviously would need to make sure she  is on calcium and vitamin D if we pursue this  #Hypothyroidism S: Compliant with levothyroxine 50 mcg Lab Results  Component Value Date   TSH 0.64 09/16/2019  A/P: Hopefully controlled-update TSH  % History of Barrett's esophagus on EGD 2016.  In 2015 we rediscussed and she opted not to pursue further EGD given her age.  She remains on Prevacid 30 mg twice a day -We will plan to intermittently check B12-normal July 2020   # low back pain- going to return to orthopedics. Steroid injections did not help most recently- I believe b/l L5 selective nereve root block . Was told needed surgery but at her age is not considered a great candidate with her age.  -Dr. Ethelene Hal mention trial of oral steroids-she asked me to prescribe this today.  I did warn of potential risks to osteoporosis but pain is significant enough that she would like to try this.  #Hernia LLQ- does not want to pursue surgery at present but will let me know if needed- would only want to do if could do spinal anesthesia. Rough experience with general anesthesia- states was awake but couldn't move her body. She will call if worsens-she declined surgical referral at this time  left eyelid swelling  saw Dr. Selena Batten 02/14/20- possible allergies- eye drops have helped. Eye doctor said left eye was dry related to imdur but imdur needed for angina symptoms  Recommended follow up: Return in about 6 months (around 10/13/2020) for follow up- or sooner if needed. Future Appointments  Date Time Provider Department Center  05/04/2020  3:00 PM Rollene Rotunda, MD [facility]  10/16/2020  2:40 PM Durene Cal Aldine Contes, MD [facility]    Lab/Order associations:   ICD-10-CM   1. Controlled type 2 diabetes mellitus with stage 3 chronic kidney disease, without long-term current use of insulin (HCC)  E11.22 CBC with Differential/Platelet   N18.30 Comprehensive metabolic panel    Lipid panel    Hemoglobin A1c  2. Hypothyroidism due to acquired atrophy of thyroid  E03.4 TSH  3. Age-related osteoporosis without current pathological fracture  M81.0 DG Bone Density    Meds ordered this encounter  Medications  . nitroGLYCERIN (NITROSTAT) 0.4 MG SL tablet    Sig: Place 1 tablet (0.4 mg total) under the tongue every 5 (five) minutes as needed for chest pain (if have pain after 3 pills- call 911).    Dispense:  50 tablet    Refill:  3  . losartan (COZAAR) 50 MG tablet    Sig: Take 1 tablet (50 mg total) by mouth daily.    Dispense:  90 tablet    Refill:  3  . predniSONE (DELTASONE) 20 MG tablet    Sig: Take 2 pills for 3 days, 1 pill for 4 days    Dispense:  10 tablet    Refill:  0   Return precautions advised.  Tana Conch, MD

## 2020-04-13 NOTE — Patient Instructions (Addendum)
Health Maintenance Due  Topic Date Due  . HEMOGLOBIN A1C ordered today  03/15/2020   Lets reduce losartan from 100 mg to 50 mg to see if that reduces your dizziness. You can take 1/2 tablet of the 100 mg pills until you run out and then take the smaller 50 mg pill that I sent in for you today. Continue all your other current medications.  Schedule your bone density test at check out desk. You may also call directly to X-ray at [phone] to schedule an appointment that is convenient for you.  - located [address] across the street from [place] - in the basement - you do need an appointment for the bone density tests. -If bone density worsens may consider injectable like Reclast-this likely would be better for your reflux and history of Barrett's esophagus then the Fosamax  We will try prednisone per your preference but unless this gives significant pain relief I likely would not do again due to underlying osteoporosis   Recommended follow up: Return in about 6 months (around 10/13/2020) for follow up- or sooner if needed.

## 2020-04-14 ENCOUNTER — Other Ambulatory Visit: Payer: Self-pay | Admitting: Family Medicine

## 2020-04-16 LAB — CBC WITH DIFFERENTIAL/PLATELET
Absolute Monocytes: 418 cells/uL (ref 200–950)
Basophils Absolute: 39 cells/uL (ref 0–200)
Basophils Relative: 0.7 %
Eosinophils Absolute: 39 cells/uL (ref 15–500)
Eosinophils Relative: 0.7 %
HCT: 37.8 % (ref 35.0–45.0)
Hemoglobin: 12.3 g/dL (ref 11.7–15.5)
Lymphs Abs: 1870 cells/uL (ref 850–3900)
MCH: 29.7 pg (ref 27.0–33.0)
MCHC: 32.5 g/dL (ref 32.0–36.0)
MCV: 91.3 fL (ref 80.0–100.0)
MPV: 11.6 fL (ref 7.5–12.5)
Monocytes Relative: 7.6 %
Neutro Abs: 3135 cells/uL (ref 1500–7800)
Neutrophils Relative %: 57 %
Platelets: 171 10*3/uL (ref 140–400)
RBC: 4.14 10*6/uL (ref 3.80–5.10)
RDW: 12.9 % (ref 11.0–15.0)
Total Lymphocyte: 34 %
WBC: 5.5 10*3/uL (ref 3.8–10.8)

## 2020-04-16 LAB — COMPLETE METABOLIC PANEL WITH GFR
AG Ratio: 1.5 (calc) (ref 1.0–2.5)
ALT: 8 U/L (ref 6–29)
AST: 15 U/L (ref 10–35)
Albumin: 4 g/dL (ref 3.6–5.1)
Alkaline phosphatase (APISO): 69 U/L (ref 37–153)
BUN/Creatinine Ratio: 19 (calc) (ref 6–22)
BUN: 24 mg/dL (ref 7–25)
CO2: 27 mmol/L (ref 20–32)
Calcium: 9.4 mg/dL (ref 8.6–10.4)
Chloride: 108 mmol/L (ref 98–110)
Creat: 1.25 mg/dL — ABNORMAL HIGH (ref 0.60–0.88)
GFR, Est African American: 43 mL/min/{1.73_m2} — ABNORMAL LOW (ref >=60)
GFR, Est Non African American: 37 mL/min/{1.73_m2} — ABNORMAL LOW (ref >=60)
Globulin: 2.7 g/dL (calc) (ref 1.9–3.7)
Glucose, Bld: 95 mg/dL (ref 65–99)
Potassium: 4.5 mmol/L (ref 3.5–5.3)
Sodium: 144 mmol/L (ref 135–146)
Total Bilirubin: 0.5 mg/dL (ref 0.2–1.2)
Total Protein: 6.7 g/dL (ref 6.1–8.1)

## 2020-04-16 LAB — LIPID PANEL
Cholesterol: 129 mg/dL (ref <200)
HDL: 41 mg/dL — ABNORMAL LOW (ref >=50)
LDL Cholesterol (Calc): 64 mg/dL (calc)
Non-HDL Cholesterol (Calc): 88 mg/dL (calc) (ref <130)
Total CHOL/HDL Ratio: 3.1 (calc) (ref <5.0)
Triglycerides: 154 mg/dL — ABNORMAL HIGH (ref <150)

## 2020-04-16 LAB — HOUSE ACCOUNT TRACKING

## 2020-04-16 LAB — HEMOGLOBIN A1C W/OUT EAG: Hgb A1c MFr Bld: 6 % of total Hgb — ABNORMAL HIGH (ref <5.7)

## 2020-04-16 LAB — TSH: TSH: 0.75 mIU/L (ref 0.40–4.50)

## 2020-05-02 ENCOUNTER — Encounter: Payer: Self-pay | Admitting: Family Medicine

## 2020-05-03 DIAGNOSIS — I482 Chronic atrial fibrillation, unspecified: Secondary | ICD-10-CM | POA: Insufficient documentation

## 2020-05-03 DIAGNOSIS — I6529 Occlusion and stenosis of unspecified carotid artery: Secondary | ICD-10-CM | POA: Insufficient documentation

## 2020-05-03 DIAGNOSIS — Z7189 Other specified counseling: Secondary | ICD-10-CM | POA: Insufficient documentation

## 2020-05-03 NOTE — Progress Notes (Signed)
/     Cardiology Office Note   Date:  05/04/2020   ID:  AYRABELLA LABOMBARD, DOB 1924/11/25, MRN [id number]  PCP:  Shelva Majestic, MD  Cardiologist:   No primary care provider on file.   No chief complaint on file.     History of Present Illness: Victoria Gomez is a 86 y.o. female who presents for followup up of atrial fibrillation, chronic recurrent diastolic and systolic HF and CAD.   The patient was admitted 09/13/17 with flash pulmonary edema and hypertension. After admission she developed CP with new anterolateral TWI and a mildly elevated troponin. Cath done 09/16/17 showed a CTO of her RCA with L-R collaterals, 45% LAD, and 70% OM1.  The plan is for medical Rx.  Her EF was 40 - 45%.   Since I last saw her   She had a couple of episodes of dizziness.  It is hard for her to quantify or qualify.  At one point she says she feels like she might pass out and other points she does not describe this.  She had some episodes of dizziness.  This happens sporadically.  They do not seem to be positional.  She has had no neck or arm discomfort but she did have chest discomfort the other day and she actually had to take a nitroglycerin.  She said that this is very rare.  She still lives independently and does her household chores.   Past Medical History:  Diagnosis Date  . Arthritis   . Atrial fibrillation (HCC)    a. Dx 09/2013.  . Bladder disorder   . Bradycardia 08/2012   a. Eval for symptomatic bradycardia 08/2012 - beta blocker discontinued.  [name] BREAST MASS, BENIGN   . CAD (coronary artery disease)    a. s/p PCI to RCA '01. b. repeat PCI to RCA '03 2/2 ISR. c. cath 2012: RCA dz, rx medically.  Occluded RCA.  Nonobstructive disease elsewhere  . Chronic combined systolic and diastolic CHF (congestive heart failure) (HCC)    a. EF 40-45% in 2014. b. EF reported to be normal in 03/2015 nuc.  . CKD (chronic kidney disease), stage III   . DIVERTICULOSIS, COLON   . Epistaxis   . Esophageal  reflux   . History of breast lump/mass excision 05/23/2009   Qualifier: History of  By: Lovell Sheehan MD, Balinda Quails   . HYPERLIPIDEMIA 03/07/2008  . HYPERTENSION 08/03/2007  . HYPOTHYROIDISM   . Internal hemorrhoids   . Interstitial cystitis   . LBBB (left bundle branch block)   . Orthostatic hypotension 08/2012   a. 08/2012 w/ syncope - meds adjusted.   . Right hand fracture     Past Surgical History:  Procedure Laterality Date  . ABDOMINAL HYSTERECTOMY    . ANKLE RECONSTRUCTION     Left  . APPENDECTOMY    . BREAST SURGERY     benign mass  . CARDIOVERSION N/A 10/20/2013  . CARDIOVERSION N/A 04/24/2015   Procedure: CARDIOVERSION;  Surgeon: Lars Masson, MD;  Location: [facility] ENDOSCOPY;  Service: Cardiovascular;  Laterality: N/A;  . CATARACT EXTRACTION, BILATERAL    . CESAREAN SECTION    . CHOLECYSTECTOMY    . LEFT HEART CATH AND CORONARY ANGIOGRAPHY N/A 09/16/2017   Procedure: LEFT HEART CATH AND CORONARY ANGIOGRAPHY;  Surgeon: Swaziland, Peter M, MD;  Location: [facility] INVASIVE CV LAB;  Service: Cardiovascular;  Laterality: N/A;  . TONSILLECTOMY    . WRIST RECONSTRUCTION     Right  Current Outpatient Medications  Medication Sig Dispense Refill  . acetaminophen (TYLENOL) 500 MG tablet Take 500 mg by mouth every 6 (six) hours as needed for mild pain. Reported on 12/12/2015    . albuterol (PROVENTIL HFA;VENTOLIN HFA) 108 (90 Base) MCG/ACT inhaler TAKE 2 PUFFS BY MOUTH EVERY 6 HOURS AS NEEDED FOR WHEEZE OR SHORTNESS OF BREATH 8.5 Inhaler 1  . diclofenac sodium (VOLTAREN) 1 % GEL Apply 2 g topically 4 (four) times daily. 100 g 3  . ELIQUIS 2.5 MG TABS tablet TAKE 1 TABLET TWICE A DAY 180 tablet 3  . furosemide (LASIX) 20 MG tablet TAKE 3 TABLETS BY MOUTH EVERY DAY 270 tablet 1  . isosorbide mononitrate (IMDUR) 120 MG 24 hr tablet TAKE 1 TABLET DAILY        **KREMERS MFR** 90 tablet 3  . KLOR-CON M10 10 MEQ tablet TAKE 1 TABLET BY MOUTH EVERY DAY 90 tablet 2  . lansoprazole (PREVACID) 30 MG  capsule TAKE 1 CAPSULE TWICE DAILY 180 capsule 3  . losartan (COZAAR) 50 MG tablet Take 1 tablet (50 mg total) by mouth daily. 90 tablet 3  . metoprolol tartrate (LOPRESSOR) 25 MG tablet Take 1 tablet (25 mg total) by mouth 2 (two) times daily. 180 tablet 3  . nitroGLYCERIN (NITROSTAT) 0.4 MG SL tablet Place 1 tablet (0.4 mg total) under the tongue every 5 (five) minutes as needed for chest pain (if have pain after 3 pills- call 911). 50 tablet 3  . pravastatin (PRAVACHOL) 40 MG tablet TAKE 1 TABLET DAILY 30 tablet 0  . prednisoLONE acetate (PRED FORTE) 1 % ophthalmic suspension Place into the right eye.    . predniSONE (DELTASONE) 20 MG tablet Take 2 pills for 3 days, 1 pill for 4 days 10 tablet 0  . SYNTHROID 50 MCG tablet TAKE 1 TABLET DAILY BEFORE BREAKFAST. 90 tablet 3   No current facility-administered medications for this visit.    Allergies:   Codeine and Sulfonamide derivatives    ROS:  Please see the history of present illness.   Otherwise, review of systems are positive for none.   All other systems are reviewed and negative.    PHYSICAL EXAM: VS:  BP 130/70   Pulse 70   Temp (!) 96.8 F (36 C)   Ht 5\' 3"  (1.6 m)   Wt 154 lb 9.6 oz (70.1 kg)   SpO2 94%   BMI 27.39 kg/m  , BMI Body mass index is 27.39 kg/m. GENERAL:  Well appearing NECK:  No jugular venous distention, waveform within normal limits, carotid upstroke brisk and symmetric, no bruits, no thyromegaly LUNGS:  Clear to auscultation bilaterally CHEST:  Unremarkable HEART:  PMI not displaced or sustained,S1 and S2 within normal limits, no S3, no clicks, no rubs, no murmurs, irregular ABD:  Flat, positive bowel sounds normal in frequency in pitch, no bruits, no rebound, no guarding, no midline pulsatile mass, no hepatomegaly, no splenomegaly EXT:  2 plus pulses throughout, no edema, no cyanosis no clubbing   EKG:  EKG is ordered today. The ekg ordered today demonstrates atrial fibrillation, left bundle branch  block, left axis deviation   Recent Labs: 04/13/2020: ALT 8; BUN 24; Creat 1.25; Hemoglobin 12.3; Platelets 171; Potassium 4.5; Sodium 144; TSH 0.75    Lipid Panel    Component Value Date/Time   CHOL 129 04/13/2020 1627   TRIG 154 (H) 04/13/2020 1627   HDL 41 (L) 04/13/2020 1627   CHOLHDL 3.1 04/13/2020 1627   VLDL  27.2 01/14/2019 1018   LDLCALC 64 04/13/2020 1627   LDLDIRECT 65.0 06/26/2016 1546      Wt Readings from Last 3 Encounters:  05/04/20 154 lb 9.6 oz (70.1 kg)  04/13/20 155 lb 6.4 oz (70.5 kg)  09/16/19 160 lb 9.6 oz (72.8 kg)      Other studies Reviewed: Additional studies/ records that were reviewed today include: None. Review of the above records demonstrates:  Please see elsewhere in the note.     ASSESSMENT AND PLAN:  Atrial fib:  -   She tolerates her anticoagulation. Ms. JLEE HARKLESS has a CHA2DS2 - VASc score of 5.  It is difficult for me to assess whether she is having any symptomatic bradycardia arrhythmias that she cannot give me a very clear history.  I am going to screen her with a 2-week event monitor.  Of note she is up-to-date with blood counts.  CAD  -    she has nonobstructive disease as above.  She does not have any increased symptoms and rarely has to take nitroglycerin.  I will manage this with medications.  No further imaging at this point.   HTN -     The blood pressures well controlled.  She will continue the meds as listed.  Diastolic HF (heart failure) -        She seems to be euvolemic.  No change in therapy.  Carotid stenosis  -     This was  mild in 2016.  No further testing.   Dyslipidemia -    LDL was 64.    No change in therapy.   Covid education -    She has had her vaccines.     Current medicines are reviewed at length with the patient today.  The patient does not have concerns regarding medicines.  The following changes have been made:  no change  Labs/ tests ordered today include:   Orders Placed This Encounter   Procedures  . CARDIAC EVENT MONITOR  . EKG 12-Lead     Disposition:   FU with me in six months or sooner with progressive symptoms.      Signed, Minus Breeding, MD  05/04/2020 4:11 PM    [facility]

## 2020-05-04 ENCOUNTER — Other Ambulatory Visit: Payer: Self-pay

## 2020-05-04 ENCOUNTER — Encounter: Payer: Self-pay | Admitting: Cardiology

## 2020-05-04 ENCOUNTER — Ambulatory Visit (INDEPENDENT_AMBULATORY_CARE_PROVIDER_SITE_OTHER): Payer: Medicare Other | Admitting: Cardiology

## 2020-05-04 ENCOUNTER — Telehealth: Payer: Self-pay | Admitting: Radiology

## 2020-05-04 VITALS — BP 130/70 | HR 70 | Temp 96.8°F | Ht 63.0 in | Wt 154.6 lb

## 2020-05-04 DIAGNOSIS — I6529 Occlusion and stenosis of unspecified carotid artery: Secondary | ICD-10-CM

## 2020-05-04 DIAGNOSIS — I5032 Chronic diastolic (congestive) heart failure: Secondary | ICD-10-CM | POA: Diagnosis not present

## 2020-05-04 DIAGNOSIS — I10 Essential (primary) hypertension: Secondary | ICD-10-CM

## 2020-05-04 DIAGNOSIS — R42 Dizziness and giddiness: Secondary | ICD-10-CM

## 2020-05-04 DIAGNOSIS — I2510 Atherosclerotic heart disease of native coronary artery without angina pectoris: Secondary | ICD-10-CM

## 2020-05-04 DIAGNOSIS — E1159 Type 2 diabetes mellitus with other circulatory complications: Secondary | ICD-10-CM

## 2020-05-04 DIAGNOSIS — E785 Hyperlipidemia, unspecified: Secondary | ICD-10-CM

## 2020-05-04 DIAGNOSIS — I482 Chronic atrial fibrillation, unspecified: Secondary | ICD-10-CM | POA: Diagnosis not present

## 2020-05-04 DIAGNOSIS — Z7189 Other specified counseling: Secondary | ICD-10-CM

## 2020-05-04 NOTE — Telephone Encounter (Signed)
Enrolled patient for a 14 day [employer] Event Monitor to be mailed to patients home.  

## 2020-05-04 NOTE — Patient Instructions (Signed)
Medication Instructions:  Your physician recommends that you continue on your current medications as directed. Please refer to the Current Medication list given to you today.  *If you need a refill on your cardiac medications before your next appointment, please call your pharmacy*  Lab Work: NONE  Testing/Procedures: Your physician has recommended that you wear an event monitor. Event monitors are medical devices that record the heart's electrical activity. Doctors most often [place] these monitors to diagnose arrhythmias. Arrhythmias are problems with the speed or rhythm of the heartbeat. The monitor is a small, portable device. You can wear one while you do your normal daily activities. This is usually used to diagnose what is causing palpitations/syncope (passing out).  Follow-Up: At [facility], you and your health needs are our priority.  As part of our continuing mission to provide you with exceptional heart care, we have created designated Provider Care Teams.  These Care Teams include your primary Cardiologist (physician) and Advanced Practice Providers (APPs -  Physician Assistants and Nurse Practitioners) who all work together to provide you with the care you need, when you need it.  We recommend signing up for the patient portal called "MyChart".  Sign up information is provided on this After Visit Summary.  MyChart is used to connect with patients for Virtual Visits (Telemedicine).  Patients are able to view lab/test results, encounter notes, upcoming appointments, etc.  Non-urgent messages can be sent to your provider as well.   To learn more about what you can do with MyChart, go to [email or web address].    Your next appointment:   6 month(s)  The format for your next appointment:   In Person  Provider:   You may see DR Antoine Poche  or one of the following Advanced Practice Providers on your designated Care Team:    Theodore Demark, PA-C  Joni Reining, DNP, ANP  Cadence  Fransico Michael, NP  Other Instructions  [employer] Cardiac Event Monitor Instructions Your physician has requested you wear your cardiac event monitor for _14_ days, (1-30). [employer] may call or text to confirm a shipping address. The monitor will be sent to a land address via UPS. [employer] will not ship a monitor to a PO BOX. It typically takes 3-5 days to receive your monitor after it has been enrolled. [employer] will assist with USPS tracking if your package is delayed. The telephone number for [employer] is [phone]. Once you have received your monitor, please review the enclosed instructions. Instruction tutorials can also be viewed under help and settings on the enclosed cell phone. Your monitor has already been registered assigning a specific monitor serial # to you.  Applying the monitor Remove cell phone from case and turn it on. The cell phone works as IT consultant and needs to be within [employer] of you at all times. The cell phone will need to be charged on a daily basis. We recommend you plug the cell phone into the enclosed charger at your bedside table every night.  Monitor batteries: You will receive two monitor batteries labelled #1 and #2. These are your recorders. Plug battery #2 onto the second connection on the enclosed charger. Keep one battery on the charger at all times. This will keep the monitor battery deactivated. It will also keep it fully charged for when you need to switch your monitor batteries. A small light will be blinking on the battery emblem when it is charging. The light on the battery emblem will remain on when the battery is fully  charged.  Open package of a Monitor strip. Insert battery #1 into black hood on strip and gently squeeze monitor battery onto connection as indicated in instruction booklet. Set aside while preparing skin.  Choose location for your strip, vertical or horizontal, as indicated in the instruction booklet. Shave to remove  all hair from location. There cannot be any lotions, oils, powders, or colognes on skin where monitor is to be applied. Wipe skin clean with enclosed Saline wipe. Dry skin completely.  Peel paper labeled #1 off the back of the Monitor strip exposing the adhesive. Place the monitor on the chest in the vertical or horizontal position shown in the instruction booklet. One arrow on the monitor strip must be pointing upward. Carefully remove paper labeled #2, attaching remainder of strip to your skin. Try not to create any folds or wrinkles in the strip as you apply it.  Firmly press and release the circle in the center of the monitor battery. You will hear a small beep. This is turning the monitor battery on. The heart emblem on the monitor battery will light up every 5 seconds if the monitor battery in turned on and connected to the patient securely. Do not push and hold the circle down as this turns the monitor battery off. The cell phone will locate the monitor battery. A screen will appear on the cell phone checking the connection of your monitor strip. This may read poor connection initially but change to good connection within the next minute. Once your monitor accepts the connection you will hear a series of 3 beeps followed by a climbing crescendo of beeps. A screen will appear on the cell phone showing the two monitor strip placement options. Touch the picture that demonstrates where you applied the monitor strip.  Your monitor strip and battery are waterproof. You are able to shower, bathe, or swim with the monitor on. They just ask you do not submerge deeper than 3 feet underwater. We recommend removing the monitor if you are swimming in a lake, river, or ocean.  Your monitor battery will need to be switched to a fully charged monitor battery approximately once a week. The cell phone will alert you of an action which needs to be made.  On the cell phone, tap for details to reveal  connection status, monitor battery status, and cell phone battery status. The green dots indicates your monitor is in good status. A red dot indicates there is something that needs your attention.  To record a symptom, click the circle on the monitor battery. In 30-60 seconds a list of symptoms will appear on the cell phone. Select your symptom and tap save. Your monitor will record a sustained or significant arrhythmia regardless of you clicking the button. Some patients do not feel the heart rhythm irregularities. [employer] will notify us of any serious or critical events.  Refer to instruction booklet for instructions on switching batteries, changing strips, the Do not disturb or Pause features, or any additional questions.  Call [employer] at [phone], to confirm your monitor is transmitting and record your baseline. They will answer any questions you may have regarding the monitor instructions at that time.  Returning the monitor to [employer] Place all equipment back into blue box. Peel off strip of paper to expose adhesive and close box securely. There is a prepaid UPS shipping label on this box. Drop in a UPS drop box, or at a UPS facility like [employer]. You may also contact [employer] to arrange  UPS to pick up monitor package at your home.

## 2020-05-13 ENCOUNTER — Encounter (INDEPENDENT_AMBULATORY_CARE_PROVIDER_SITE_OTHER): Payer: Medicare Other

## 2020-05-13 DIAGNOSIS — I482 Chronic atrial fibrillation, unspecified: Secondary | ICD-10-CM | POA: Diagnosis not present

## 2020-05-13 DIAGNOSIS — R42 Dizziness and giddiness: Secondary | ICD-10-CM | POA: Diagnosis not present

## 2020-05-18 ENCOUNTER — Telehealth: Payer: Self-pay | Admitting: Family Medicine

## 2020-05-18 NOTE — Telephone Encounter (Signed)
Patients son called and stated he had purchased patient new hearing aids and just wanted to inform her PCP

## 2020-05-18 NOTE — Telephone Encounter (Signed)
Noted  

## 2020-05-28 DIAGNOSIS — N3941 Urge incontinence: Secondary | ICD-10-CM | POA: Diagnosis not present

## 2020-05-28 DIAGNOSIS — N3010 Interstitial cystitis (chronic) without hematuria: Secondary | ICD-10-CM | POA: Diagnosis not present

## 2020-05-28 DIAGNOSIS — R351 Nocturia: Secondary | ICD-10-CM | POA: Diagnosis not present

## 2020-06-11 ENCOUNTER — Telehealth: Payer: Self-pay | Admitting: Cardiology

## 2020-06-11 NOTE — Telephone Encounter (Signed)
    Pt said she saw the result of her heart monitor on mychart and would like to speak with a nurse to explain it to her

## 2020-06-11 NOTE — Telephone Encounter (Signed)
Called and spoke with pt, reviewed Dr.Hochrein's recommendations with her heart monitor results. Pt made aware to stop her beta blocker (metoprolol tartrate). Pt verbalized understanding and had no other questions at this time. Reminded pt of her follow up appt with Dr.Hochrein on 10/26/20 at 12:20pm. Pt thankful for the call.

## 2020-06-13 NOTE — Telephone Encounter (Signed)
Called patient to advise of results unaware that [facility] RN had given yesterday Blood pressure this morning was 183/87 HR 76  this am  Took morning medications and came down 159/85 HR 84  Patient will continue to monitor and call back if no improvement in numbers Will forward to Dr Antoine Poche for review

## 2020-06-18 ENCOUNTER — Telehealth: Payer: Self-pay | Admitting: Family Medicine

## 2020-06-18 NOTE — Progress Notes (Signed)
  Chronic Care Management   Outreach Note  06/18/2020 Name: Victoria Gomez MRN: [id number] DOB: Mar 23, 1925  Referred by: Shelva Majestic, MD Reason for referral : No chief complaint on file.   An unsuccessful telephone outreach was attempted today. The patient was referred to the pharmacist for assistance with care management and care coordination.   Follow Up Plan:   Lynnae January Upstream Scheduler

## 2020-06-28 ENCOUNTER — Other Ambulatory Visit: Payer: Self-pay | Admitting: Family Medicine

## 2020-07-02 ENCOUNTER — Telehealth: Payer: Self-pay | Admitting: Family Medicine

## 2020-07-02 NOTE — Progress Notes (Signed)
  Chronic Care Management   Outreach Note  07/02/2020 Name: Victoria Gomez MRN: [id number] DOB: [birth year]  Referred by: Shelva Majestic, MD Reason for referral : No chief complaint on file.   An unsuccessful telephone outreach was attempted today. The patient was referred to the pharmacist for assistance with care management and care coordination.   Follow Up Plan:   Lynnae January Upstream Scheduler

## 2020-07-10 ENCOUNTER — Telehealth: Payer: Self-pay | Admitting: Family Medicine

## 2020-07-10 NOTE — Progress Notes (Signed)
°  Chronic Care Management   Note  07/10/2020 Name: Victoria Gomez MRN: [id number] DOB: [birth year]  Victoria Gomez is a 86 y.o. year old female who is a primary care patient of Durene Cal, Aldine Contes, MD. I reached out to [name] by phone today in response to a referral sent by [name]'s PCP, Shelva Majestic, MD.   [name] was given information about Chronic Care Management services today including:  1. CCM service includes personalized support from designated clinical staff supervised by her physician, including individualized plan of care and coordination with other care providers 2. 24/7 contact phone numbers for assistance for urgent and routine care needs. 3. Service will only be billed when office clinical staff spend 20 minutes or more in a month to coordinate care. 4. Only one practitioner may furnish and bill the service in a calendar month. 5. The patient may stop CCM services at any time (effective at the end of the month) by phone call to the office staff.   Patient agreed to services and verbal consent obtained.   Follow up plan:  Lynnae January Upstream Scheduler

## 2020-07-18 MED ORDER — PRAVASTATIN SODIUM 40 MG PO TABS: 40.0000 mg | ORAL_TABLET | Freq: Every day | ORAL | 3 refills | Status: DC

## 2020-07-18 NOTE — Telephone Encounter (Signed)
Called and spoke with pt, medication sent to local pharmacy per pt request.

## 2020-07-18 NOTE — Telephone Encounter (Signed)
Pt called following up on medication refill. Pt states she called [facility] and they did not have a refill for her. Please advise.

## 2020-07-18 NOTE — Addendum Note (Signed)
Addended by: Lieutenant Diego A on: 07/18/2020 03:10 PM   Modules accepted: Orders

## 2020-08-28 ENCOUNTER — Telehealth: Payer: Self-pay | Admitting: Family Medicine

## 2020-08-28 DIAGNOSIS — R351 Nocturia: Secondary | ICD-10-CM | POA: Diagnosis not present

## 2020-08-28 DIAGNOSIS — R102 Pelvic and perineal pain: Secondary | ICD-10-CM | POA: Diagnosis not present

## 2020-08-28 DIAGNOSIS — N3010 Interstitial cystitis (chronic) without hematuria: Secondary | ICD-10-CM | POA: Diagnosis not present

## 2020-08-28 DIAGNOSIS — R3915 Urgency of urination: Secondary | ICD-10-CM | POA: Diagnosis not present

## 2020-08-28 NOTE — Telephone Encounter (Signed)
MAILBOX FULL. Pt due to schedule Medicare Annual Wellness Visit (AWV) either virtually/audio only OR in office. Whatever the patients preference is.  Last AWV 08/01/19; please schedule at anytime with [facility]-Nurse Health Advisor at [facility].  This should be a 45 minute visit.

## 2020-09-25 ENCOUNTER — Other Ambulatory Visit: Payer: Self-pay | Admitting: Family Medicine

## 2020-09-25 ENCOUNTER — Other Ambulatory Visit: Payer: Self-pay | Admitting: Cardiology

## 2020-10-03 ENCOUNTER — Other Ambulatory Visit: Payer: Self-pay

## 2020-10-03 ENCOUNTER — Other Ambulatory Visit: Payer: Self-pay | Admitting: Family Medicine

## 2020-10-03 ENCOUNTER — Other Ambulatory Visit: Payer: Self-pay | Admitting: Cardiology

## 2020-10-03 ENCOUNTER — Ambulatory Visit (INDEPENDENT_AMBULATORY_CARE_PROVIDER_SITE_OTHER): Payer: Medicare Other

## 2020-10-03 ENCOUNTER — Ambulatory Visit
Admission: EM | Admit: 2020-10-03 | Discharge: 2020-10-03 | Disposition: A | Discharge status: Home / Self Care | Payer: Medicare Other | Attending: Family Medicine | Admitting: Family Medicine

## 2020-10-03 DIAGNOSIS — M25561 Pain in right knee: Secondary | ICD-10-CM

## 2020-10-03 DIAGNOSIS — S8001XA Contusion of right knee, initial encounter: Secondary | ICD-10-CM | POA: Diagnosis not present

## 2020-10-03 DIAGNOSIS — W19XXXA Unspecified fall, initial encounter: Secondary | ICD-10-CM

## 2020-10-03 MED ORDER — TRAMADOL HCL 50 MG PO TABS: 50.0000 mg | ORAL_TABLET | Freq: Four times a day (QID) | ORAL | 0 refills | Status: DC | PRN

## 2020-10-03 NOTE — ED Triage Notes (Signed)
Patient states she was walking in here yard and slipped and fell onto her right knee. Pt states it felt like something tore in her knee. Pt is aox4 and ambulates with a limp due to pain.

## 2020-10-03 NOTE — Discharge Instructions (Addendum)
Apply the diclofenac gel three times a day to the painful area.

## 2020-10-03 NOTE — ED Provider Notes (Addendum)
[facility]    CSN: [id number] Arrival date & time: 10/03/20  1339      History   Chief Complaint Chief Complaint  Patient presents with  . Fall    today  . Knee Pain    today    HPI Victoria Gomez is a 86 y.o. female.   Initial [facility] visit for this 86 yo woman who fell today.  Patient states she was walking in her yard 3 days ago and slipped and fell onto her right knee. Pt states it felt like something tore in her knee. Pt is aox4 and ambulates with a limp due to pain.  Has been trying ice and heat, the latter helping more  No other injuries     Past Medical History:  Diagnosis Date  . Arthritis   . Atrial fibrillation (HCC)    a. Dx 09/2013.  . Bladder disorder   . Bradycardia 08/2012   a. Eval for symptomatic bradycardia 08/2012 - beta blocker discontinued.  [name] BREAST MASS, BENIGN   . CAD (coronary artery disease)    a. s/p PCI to RCA '01. b. repeat PCI to RCA '03 2/2 ISR. c. cath 2012: RCA dz, rx medically.  Occluded RCA.  Nonobstructive disease elsewhere  . Chronic combined systolic and diastolic CHF (congestive heart failure) (HCC)    a. EF 40-45% in 2014. b. EF reported to be normal in 03/2015 nuc.  . CKD (chronic kidney disease), stage III (HCC)   . DIVERTICULOSIS, COLON   . Epistaxis   . Esophageal reflux   . History of breast lump/mass excision 05/23/2009   Qualifier: History of  By: Lovell Sheehan MD, Balinda Quails   . HYPERLIPIDEMIA 03/07/2008  . HYPERTENSION 08/03/2007  . HYPOTHYROIDISM   . Internal hemorrhoids   . Interstitial cystitis   . LBBB (left bundle branch block)   . Orthostatic hypotension 08/2012   a. 08/2012 w/ syncope - meds adjusted.   . Right hand fracture     Patient Active Problem List   Diagnosis Date Noted  . Educated about COVID-19 virus infection 05/03/2020  . Stenosis of carotid artery 05/03/2020  . Chronic atrial fibrillation (HCC) 05/03/2020  . Chronic anticoagulation 07/09/2017  . Left groin hernia 06/25/2017  .  Osteoporosis 06/28/2015  . Vitamin B12 deficiency 07/28/2014  . CKD (chronic kidney disease), stage III (HCC) 09/22/2013  . LBBB (left bundle branch block) 09/22/2013  . Controlled type 2 diabetes mellitus with renal manifestation (HCC) 09/22/2013  . Internal hemorrhoids   . Permanent atrial fibrillation (HCC) 09/21/2013  . H/O sinus bradycardia 08/10/2012  . Orthostatic hypotension 08/10/2012  . Fatigue 04/20/2012  . Combined systolic and diastolic congestive heart failure (HCC) 11/17/2011  . ARTHRITIS 12/30/2010  . Left lumbar radiculopathy 12/11/2010  . Interstitial cystitis   . Hyperlipidemia associated with type 2 diabetes mellitus (HCC) 03/07/2008  . Esophageal reflux 10/18/2007  . Hypothyroidism 08/03/2007  . Hypertension associated with diabetes (HCC) 08/03/2007  . Coronary artery disease involving native coronary artery of native heart with angina pectoris (HCC) 08/03/2007  . History of Barrett's esophagus 08/03/2007    Past Surgical History:  Procedure Laterality Date  . ABDOMINAL HYSTERECTOMY    . ANKLE RECONSTRUCTION     Left  . APPENDECTOMY    . BREAST SURGERY     benign mass  . CARDIOVERSION N/A 10/20/2013  . CARDIOVERSION N/A 04/24/2015   Procedure: CARDIOVERSION;  Surgeon: Lars Masson, MD;  Location: [facility] ENDOSCOPY;  Service: Cardiovascular;  Laterality:  N/A;  . CATARACT EXTRACTION, BILATERAL    . CESAREAN SECTION    . CHOLECYSTECTOMY    . LEFT HEART CATH AND CORONARY ANGIOGRAPHY N/A 09/16/2017   Procedure: LEFT HEART CATH AND CORONARY ANGIOGRAPHY;  Surgeon: [place], Peter M, MD;  Location: [facility] INVASIVE CV LAB;  Service: Cardiovascular;  Laterality: N/A;  . TONSILLECTOMY    . WRIST RECONSTRUCTION     Right    OB History   No obstetric history on file.      Home Medications    Prior to Admission medications   Medication Sig Start Date End Date Taking? Authorizing Provider  ELIQUIS 2.5 MG TABS tablet TAKE 1 TABLET TWICE A DAY 03/26/20  Yes Rollene Rotunda, MD  furosemide (LASIX) 20 MG tablet TAKE 3 TABLETS BY MOUTH EVERY DAY 04/16/20  Yes Shelva Majestic, MD  isosorbide mononitrate (IMDUR) 120 MG 24 hr tablet TAKE 1 TABLET DAILY        **KREMERS URBAN MFR** 09/25/20  Yes Rollene Rotunda, MD  losartan (COZAAR) 50 MG tablet Take 1 tablet (50 mg total) by mouth daily. 04/13/20  Yes Shelva Majestic, MD  pravastatin (PRAVACHOL) 40 MG tablet TAKE 1 TABLET DAILY 09/25/20  Yes Shelva Majestic, MD  SYNTHROID 50 MCG tablet TAKE 1 TABLET DAILY BEFORE BREAKFAST. 12/06/19  Yes Shelva Majestic, MD  diclofenac sodium (VOLTAREN) 1 % GEL Apply 2 g topically 4 (four) times daily. 09/16/19   Shelva Majestic, MD  KLOR-CON M10 10 MEQ tablet TAKE 1 TABLET BY MOUTH EVERY DAY 01/04/20   Shelva Majestic, MD  lansoprazole (PREVACID) 30 MG capsule TAKE 1 CAPSULE TWICE DAILY 03/26/20   Rollene Rotunda, MD  nitroGLYCERIN (NITROSTAT) 0.4 MG SL tablet Place 1 tablet (0.4 mg total) under the tongue every 5 (five) minutes as needed for chest pain (if have pain after 3 pills- call 911). 04/13/20   Shelva Majestic, MD  traMADol (ULTRAM) 50 MG tablet Take 1 tablet (50 mg total) by mouth every 6 (six) hours as needed. 10/03/20   Elvina Sidle, MD  albuterol (PROVENTIL HFA;VENTOLIN HFA) 108 (90 Base) MCG/ACT inhaler TAKE 2 PUFFS BY MOUTH EVERY 6 HOURS AS NEEDED FOR WHEEZE OR SHORTNESS OF BREATH 02/01/18 10/03/20  Shelva Majestic, MD    Family History Family History  Problem Relation Age of Onset  . Colon cancer Mother   . Coronary artery disease Mother   . Heart disease Mother   . Coronary artery disease Father   . Heart disease Father   . Colon polyps Sister   . Coronary artery disease Sister   . Diabetes type II Son   . Coronary artery disease Brother     Social History Social History   Tobacco Use  . Smoking status: Former Smoker    Packs/day: 1.00    Years: 15.00    Pack years: 15.00    Types: Cigarettes    Quit date: 11/10/1969    Years since  quitting: 50.9  . Smokeless tobacco: Never Used  Vaping Use  . Vaping Use: Never used  Substance Use Topics  . Alcohol use: No    Alcohol/week: 0.0 standard drinks  . Drug use: No     Allergies   Codeine and Sulfonamide derivatives   Review of Systems Review of Systems  Constitutional: Negative.   Musculoskeletal: Positive for gait problem.  Skin: Negative for color change.  All other systems reviewed and are negative.    Physical Exam Triage Vital  Signs ED Triage Vitals  Enc Vitals Group     BP 10/03/20 1351 133/79     Pulse Rate 10/03/20 1351 84     Resp 10/03/20 1351 18     Temp 10/03/20 1351 98 F (36.7 C)     Temp Source 10/03/20 1351 Oral     SpO2 10/03/20 1351 96 %     Weight --      Height --      Head Circumference --      Peak Flow --      Pain Score 10/03/20 1352 8     Pain Loc --      Pain Edu? --      Excl. in GC? --    No data found.  Updated Vital Signs BP 133/79 (BP Location: Left Arm)   Pulse 84   Temp 98 F (36.7 C) (Oral)   Resp 18   SpO2 96%    Physical Exam Vitals reviewed.  Constitutional:      General: She is not in acute distress.    Appearance: Normal appearance. She is normal weight. She is not ill-appearing.  HENT:     Head: Normocephalic.  Eyes:     Conjunctiva/sclera: Conjunctivae normal.  Cardiovascular:     Rate and Rhythm: Normal rate.  Pulmonary:     Effort: Pulmonary effort is normal.  Musculoskeletal:        General: Tenderness and signs of injury present. No swelling or deformity.     Cervical back: Normal range of motion and neck supple.     Comments: Tender anterior medial right joint line.  No effusion appreciated.  Ligaments intact.  No ecchymosis or deformity  Skin:    General: Skin is warm and dry.  Neurological:     General: No focal deficit present.     Mental Status: She is alert.     Gait: Gait abnormal.      UC Treatments / Results  Labs (all labs ordered are listed, but only abnormal  results are displayed) Labs Reviewed - No data to display  EKG   Radiology DG Knee Complete 4 Views Right  Result Date: 10/03/2020 CLINICAL DATA:  Fall, right knee pain EXAM: RIGHT KNEE - COMPLETE 4+ VIEW COMPARISON:  None. FINDINGS: Joint space narrowing in the patellofemoral compartment. No joint effusion. No acute bony abnormality. Specifically, no fracture, subluxation, or dislocation. IMPRESSION: No acute bony abnormality. Electronically Signed   By: Charlett Nose M.D.   On: 10/03/2020 14:48   Osteoporotic appearing tibia on right knee films Procedures Procedures (including critical care time)  Medications Ordered in UC Medications - No data to display  Initial Impression / Assessment and Plan / UC Course  I have reviewed the triage vital signs and the nursing notes.  Pertinent labs & imaging results that were available during my care of the patient were reviewed by me and considered in my medical decision making (see chart for details).    Final Clinical Impressions(s) / UC Diagnoses   Final diagnoses:  Contusion of right knee, initial encounter     Discharge Instructions     Apply the diclofenac gel three times a day to the painful area.    ED Prescriptions    Medication Sig Dispense Auth. Provider   traMADol (ULTRAM) 50 MG tablet Take 1 tablet (50 mg total) by mouth every 6 (six) hours as needed. 15 tablet Elvina Sidle, MD     I have reviewed the PDMP during  this encounter.   Elvina SidleLauenstein, Aureliano Oshields, MD 10/03/20 1449    Elvina SidleLauenstein, Niquan Charnley, MD 10/03/20 1453

## 2020-10-08 ENCOUNTER — Telehealth: Payer: Self-pay

## 2020-10-08 NOTE — Progress Notes (Signed)
Chronic Care Management Pharmacy Assistant   Name: Victoria Gomez  MRN: [id number] DOB: [birth year]  Reason for Encounter: Medication Review/ Initial Visit   Victoria Gomez,  86 y.o. , female presents for their Initial CCM visit with the clinical pharmacist via telephone.  PCP : Victoria Majestic, MD  Allergies:   Allergies  Allergen Reactions  . Codeine Hives and Nausea And Vomiting  . Sulfonamide Derivatives Swelling    Turns red    Medications: Outpatient Encounter Medications as of 10/08/2020  Medication Sig  . diclofenac sodium (VOLTAREN) 1 % GEL Apply 2 g topically 4 (four) times daily.  [name] ELIQUIS 2.5 MG TABS tablet TAKE 1 TABLET TWICE A DAY  . furosemide (LASIX) 20 MG tablet TAKE 3 TABLETS BY MOUTH EVERY DAY  . isosorbide mononitrate (IMDUR) 120 MG 24 hr tablet TAKE 1 TABLET DAILY        **KREMERS URBAN MFR**  . KLOR-CON M10 10 MEQ tablet TAKE 1 TABLET BY MOUTH EVERY DAY  . lansoprazole (PREVACID) 30 MG capsule TAKE 1 CAPSULE TWICE DAILY  . losartan (COZAAR) 50 MG tablet Take 1 tablet (50 mg total) by mouth daily.  . nitroGLYCERIN (NITROSTAT) 0.4 MG SL tablet Place 1 tablet (0.4 mg total) under the tongue every 5 (five) minutes as needed for chest pain (if have pain after 3 pills- call 911).  . pravastatin (PRAVACHOL) 40 MG tablet TAKE 1 TABLET BY MOUTH EVERY DAY  . SYNTHROID 50 MCG tablet TAKE 1 TABLET DAILY BEFORE BREAKFAST.  [name] traMADol (ULTRAM) 50 MG tablet Take 1 tablet (50 mg total) by mouth every 6 (six) hours as needed.  . [DISCONTINUED] albuterol (PROVENTIL HFA;VENTOLIN HFA) 108 (90 Base) MCG/ACT inhaler TAKE 2 PUFFS BY MOUTH EVERY 6 HOURS AS NEEDED FOR WHEEZE OR SHORTNESS OF BREATH   No facility-administered encounter medications on file as of 10/08/2020.    Current Diagnosis: Patient Active Problem List   Diagnosis Date Noted  . Educated about COVID-19 virus infection 05/03/2020  . Stenosis of carotid artery 05/03/2020  . Chronic atrial fibrillation  (HCC) 05/03/2020  . Chronic anticoagulation 07/09/2017  . Left groin hernia 06/25/2017  . Osteoporosis 06/28/2015  . Vitamin B12 deficiency 07/28/2014  . CKD (chronic kidney disease), stage III (HCC) 09/22/2013  . LBBB (left bundle branch block) 09/22/2013  . Controlled type 2 diabetes mellitus with renal manifestation (HCC) 09/22/2013  . Internal hemorrhoids   . Permanent atrial fibrillation (HCC) 09/21/2013  . H/O sinus bradycardia 08/10/2012  . Orthostatic hypotension 08/10/2012  . Fatigue 04/20/2012  . Combined systolic and diastolic congestive heart failure (HCC) 11/17/2011  . ARTHRITIS 12/30/2010  . Left lumbar radiculopathy 12/11/2010  . Interstitial cystitis   . Hyperlipidemia associated with type 2 diabetes mellitus (HCC) 03/07/2008  . Esophageal reflux 10/18/2007  . Hypothyroidism 08/03/2007  . Hypertension associated with diabetes (HCC) 08/03/2007  . Coronary artery disease involving native coronary artery of native heart with angina pectoris (HCC) 08/03/2007  . History of Barrett's esophagus 08/03/2007     Have you seen any other providers since your last visit?       Patient stated she was seen in urgent care due to a fall and stated she hurt her knee.   Any changes in your medications or health?        Patient stated she has no changes in medications or health.  Any side effects from any medications?         Patient stated she has no  side effects from medications.  Do you have an symptoms or problems not managed by your medications?          Patient states she does not have any symptoms or problems not managed by medications.  Any concerns about your health right now?            Patient states she has no concerns about her health at this moment.  Has your provider asked that you check blood pressure, blood sugar, or follow special diet at home?           Patient states she checks her blood pressure everyday. Patient states her blood pressures have been running  low however states her numbers have been ranging from - 134/71 144/74 .  Do you get any type of exercise on a regular basis?        Patient states Dr. Durene Cal instructed her to walk around the yard.  Can you think of a goal you would like to reach for your health?             Patient states she feels like she does pretty good for her self at her age. Patient states she gets around pretty good .  Do you have any problems getting your medications?          Patient states pharmacy fills her medications on time however sometimes she has to wait on her son to pick up the medications for her.  Is there anything that you would like to discuss during the appointment?           Patient states not at this moment  Please bring medications and supplements to appointment  Victoria Gomez ,[facility] Clinical Pharmacist Assistant [phone]   Follow-Up:  Pharmacist Review

## 2020-10-10 ENCOUNTER — Ambulatory Visit: Payer: Medicare Other

## 2020-10-10 DIAGNOSIS — E1169 Type 2 diabetes mellitus with other specified complication: Secondary | ICD-10-CM

## 2020-10-10 DIAGNOSIS — I152 Hypertension secondary to endocrine disorders: Secondary | ICD-10-CM

## 2020-10-10 DIAGNOSIS — I4821 Permanent atrial fibrillation: Secondary | ICD-10-CM

## 2020-10-10 DIAGNOSIS — E1159 Type 2 diabetes mellitus with other circulatory complications: Secondary | ICD-10-CM

## 2020-10-10 DIAGNOSIS — E785 Hyperlipidemia, unspecified: Secondary | ICD-10-CM

## 2020-10-10 NOTE — Progress Notes (Signed)
Chronic Care Management Pharmacy  Name: Victoria Gomez  MRN: [id number]   DOB: 1925-07-25  Chief Complaint/ HPI Victoria Gomez, 86 y.o., female, presents for their initial CCM visit with the clinical pharmacist via telephone due to COVID-19 pandemic .  PCP : Victoria Olp, MD Encounter Diagnoses  Name Primary?  . Hypertension associated with diabetes ([place]) Yes  . Hyperlipidemia associated with type 2 diabetes mellitus ([place])   . Permanent atrial fibrillation ([place])     Office Visits:  04/13/2020 (PCP): losartan dose reduction to 50 mg once daily from 100 mg daily  Consult Visit: 10/03/2020 (PCP): urgent care due to contusion of right knee, given tramadol.   Patient Active Problem List   Diagnosis Date Noted  . Educated about COVID-19 virus infection 05/03/2020  . Stenosis of carotid artery 05/03/2020  . Chronic atrial fibrillation ([place]) 05/03/2020  . Chronic anticoagulation 07/09/2017  . Left groin hernia 06/25/2017  . Osteoporosis 06/28/2015  . Vitamin B12 deficiency 07/28/2014  . CKD (chronic kidney disease), stage III ([place]) 09/22/2013  . LBBB (left bundle branch block) 09/22/2013  . Controlled type 2 diabetes mellitus with renal manifestation ([place]) 09/22/2013  . Internal hemorrhoids   . Permanent atrial fibrillation ([place]) 09/21/2013  . H/O sinus bradycardia 08/10/2012  . Orthostatic hypotension 08/10/2012  . Fatigue 04/20/2012  . Combined systolic and diastolic congestive heart failure ([place]) 11/17/2011  . ARTHRITIS 12/30/2010  . Left lumbar radiculopathy 12/11/2010  . Interstitial cystitis   . Hyperlipidemia associated with type 2 diabetes mellitus ([place]) 03/07/2008  . Esophageal reflux 10/18/2007  . Hypothyroidism 08/03/2007  . Hypertension associated with diabetes ([place]) 08/03/2007  . Coronary artery disease involving native coronary artery of native heart with angina pectoris ([place]) 08/03/2007  . History of Barrett's esophagus 08/03/2007   Past Surgical History:    Procedure Laterality Date  . ABDOMINAL HYSTERECTOMY    . ANKLE RECONSTRUCTION     Left  . APPENDECTOMY    . BREAST SURGERY     benign mass  . CARDIOVERSION N/A 10/20/2013  . CARDIOVERSION N/A 04/24/2015   Procedure: CARDIOVERSION;  Surgeon: Victoria Spark, MD;  Location: [facility];  Service: Cardiovascular;  Laterality: N/A;  . CATARACT EXTRACTION, BILATERAL    . CESAREAN SECTION    . CHOLECYSTECTOMY    . LEFT HEART CATH AND CORONARY ANGIOGRAPHY N/A 09/16/2017   Procedure: LEFT HEART CATH AND CORONARY ANGIOGRAPHY;  Surgeon: Martinique, Peter M, MD;  Location: [facility];  Service: Cardiovascular;  Laterality: N/A;  . TONSILLECTOMY    . WRIST RECONSTRUCTION     Right   Family History  Problem Relation Age of Onset  . Colon cancer Mother   . Coronary artery disease Mother   . Heart disease Mother   . Coronary artery disease Father   . Heart disease Father   . Colon polyps Sister   . Coronary artery disease Sister   . Diabetes type II Son   . Coronary artery disease Brother    Allergies  Allergen Reactions  . Codeine Hives and Nausea And Vomiting  . Sulfonamide Derivatives Swelling    Turns red   Outpatient Encounter Medications as of 10/10/2020  Medication Sig  . acetaminophen (TYLENOL) 500 MG tablet Take 1,000 mg by mouth every 8 (eight) hours as needed.  [name] ascorbic acid (VITAMIN C) 500 MG tablet Take 500 mg by mouth daily.  . Cholecalciferol (D3) 50 MCG (2000 UT) TABS Take by mouth daily.   . Cyanocobalamin (B-12)  [place] Take by mouth.  . ELIQUIS 2.5 MG TABS tablet TAKE 1 TABLET TWICE A DAY  . furosemide (LASIX) 20 MG tablet TAKE 3 TABLETS BY MOUTH EVERY DAY  . isosorbide mononitrate (IMDUR) 120 MG 24 hr tablet TAKE 1 TABLET DAILY        **[employer] MFR**  . KLOR-CON M10 10 MEQ tablet TAKE 1 TABLET BY MOUTH EVERY DAY  . lansoprazole (PREVACID) 30 MG capsule TAKE 1 CAPSULE TWICE DAILY  . losartan (COZAAR) 50 MG tablet Take 1 tablet (50 mg total) by  mouth daily.  . nitroGLYCERIN (NITROSTAT) 0.4 MG SL tablet Place 1 tablet (0.4 mg total) under the tongue every 5 (five) minutes as needed for chest pain (if have pain after 3 pills- call 911).  . pravastatin (PRAVACHOL) 40 MG tablet TAKE 1 TABLET BY MOUTH EVERY DAY  . SYNTHROID 50 MCG tablet TAKE 1 TABLET DAILY BEFORE BREAKFAST.  [name] diclofenac sodium (VOLTAREN) 1 % GEL Apply 2 g topically 4 (four) times daily.  . traMADol (ULTRAM) 50 MG tablet Take 1 tablet (50 mg total) by mouth every 6 (six) hours as needed.  . [DISCONTINUED] albuterol (PROVENTIL HFA;VENTOLIN HFA) 108 (90 Base) MCG/ACT inhaler TAKE 2 PUFFS BY MOUTH EVERY 6 HOURS AS NEEDED FOR WHEEZE OR SHORTNESS OF BREATH   No facility-administered encounter medications on file as of 10/10/2020.   Patient Care Team    Relationship Specialty Notifications Start End  Victoria Olp, MD PCP - General Family Medicine  08/18/14    Comment: Victoria Quant, MD Consulting Physician Cardiology  08/01/19   Victoria Seal, MD Attending Physician Urology  08/01/19   Victoria Pilar, MD Consulting Physician Surgery  05/02/20   Victoria Gomez, [facility] Pharmacist Pharmacist  07/10/20    Comment: [phone]   Current Diagnosis/Assessment: Goals Addressed            This Visit's Progress   . PharmD Care Plan       CARE PLAN ENTRY (see longitudinal plan of care for additional care plan information)  Current Barriers:  . Chronic Disease Management support, education, and care coordination needs related to Hypertension, Hyperlipidemia, and Atrial Fibrillation   Hypertension BP Readings from Last 3 Encounters:  10/03/20 133/79  05/04/20 130/70  04/13/20 130/64   . Pharmacist Clinical Goal(s): o Over the next 365 days, patient will work with PharmD and providers to maintain BP goal <140/90 . Current regimen:  . Imdur 120 mg once daily . Losartan 50 mg once daily (reduced from 195m 04/2020 office visit) . Interventions: o We discussed  diet and exercise extensively. Maintain a healthy weight and exercise regularly, as directed by your health care provider. Eat healthy foods, such as: Lean proteins, complex carbohydrates, fresh fruits and vegetables, low-fat dairy products, healthy fats. . Patient self care activities - Over the next 365 days, patient will: o Continue to check BP at home, document, and provide at future appointments o Ensure daily salt intake < 2300 mg/day  Hyperlipidemia Lab Results  Component Value Date/Time   LDLCALC 64 04/13/2020 04:27 PM   LDLDIRECT 65.0 06/26/2016 03:46 PM   . Pharmacist Clinical Goal(s): o Over the next 365 days, patient will work with PharmD and providers to maintain LDL goal < 70 . Current regimen:  o Pravastatin 40 mg once daily . Interventions: o Reviewed side effects - none noted . Patient self care activities - Over the next 365 days, patient will: o Continue current management  Atrial fibrillation - stroke prevention . Pharmacist Clinical Goal(s) o Over the next 365 days, patient will work with PharmD and providers to ensure medication safety and accessibility . Current regimen:  o Eliquis 2.5 mg twice daily . Interventions: o Reviewed signs of bleeding - denied any at present. o Reviewed medication accessibility and discussed patient assistance - no concerns so will not pursue. . Patient self care activities - Over the next 365 days, patient will: o Continue current management  Medication management . Pharmacist Clinical Goal(s): o Over the next 365 days, patient will work with PharmD and providers to maintain optimal medication adherence . [facility] . Interventions o Comprehensive medication review performed. o Continue current medication management strategy . Patient self care activities - Over the next 365 days, patient will: o Take medications as prescribed o Report any questions or concerns to PharmD and/or provider(s) Initial goal  documentation.      Hypertension   BP goal <140/90  BP Readings from Last 3 Encounters:  10/03/20 133/79  05/04/20 130/70  04/13/20 130/64   BMP Latest Ref Rng & Units 04/13/2020 09/16/2019 05/16/2019  Glucose 65 - 99 mg/dL 95 99 92  BUN 7 - 25 mg/dL 24 21 23   Creatinine 0.60 - 0.88 mg/dL 1.25(H) 1.14(H) 1.21(H)  BUN/Creat Ratio 6 - 22 (calc) 19 18 -  Sodium 135 - 146 mmol/L 144 142 141  Potassium 3.5 - 5.3 mmol/L 4.5 3.9 4.3  Chloride 98 - 110 mmol/L 108 106 105  CO2 20 - 32 mmol/L 27 26 29   Calcium 8.6 - 10.4 mg/dL 9.4 9.3 9.0   Lab Results  Component Value Date   GFR 41.57 (L) 05/16/2019   GFR 46.44 (L) 01/14/2019   GFR 50.46 (L) 08/27/2018   GFR 51.60 (L) 04/27/2018   GFR 64.86 12/28/2017   Patient checks BP at home first thing in the morning before taking BP medications. Recent readings are 155/74, 149/76. Diet - leafy greens, eggs/bacon for breakfast, chicken, some juice. Would normally try to do some walking, not currently due to fall last week. Taking 10 meq potassium every day w/ Lasix 60 mg daily. Denies dizziness. Has used nitrostat once ~2 weeks ago. No CP since.  Previous medications: Bystolic.  Patient is currently at goal on the following medications:  . Imdur 120 mg once daily . Losartan 50 mg once daily (reduced from 161m 04/2020 OV)  Review home BP monitoring technique.   We discussed diet and exercise extensively. Maintain a healthy weight and exercise regularly, as directed by your health care provider. Eat healthy foods, such as: Lean proteins, complex carbohydrates, fresh fruits and vegetables, low-fat dairy products, healthy fats.  Plan  Continue current medications.  Diabetes   Goal a1c <7%  Lab Results  Component Value Date/Time   HGBA1C 6.0 (H) 04/13/2020 04:27 PM   HGBA1C 6.0 (H) 09/16/2019 03:51 PM   Checking BG: Daily. Recent FBG Readings: 80s-100s. Controlled without medications Patient is currently controlled on the following  medications:  . n/a  Plan  Continue current management.  Hyperlipidemia   LDL goal < 70  Lipid Panel     Component Value Date/Time   CHOL 129 04/13/2020 1627   TRIG 154 (H) 04/13/2020 1627   HDL 41 (L) 04/13/2020 1627   LDLCALC 64 04/13/2020 1627   LDLDIRECT 65.0 06/26/2016 1546    Hepatic Function Latest Ref Rng & Units 04/13/2020 09/16/2019 05/16/2019  Total Protein 6.1 - 8.1 g/dL 6.7 6.9 7.1  Albumin  3.5 - 5.2 g/dL - - 4.2  AST 10 - 35 U/L 15 19 15   ALT 6 - 29 U/L 8 14 11   Alk Phosphatase 39 - 117 U/L - - 72  Total Bilirubin 0.2 - 1.2 mg/dL 0.5 0.5 0.5  Bilirubin, Direct 0.0 - 0.3 mg/dL - - -    The ASCVD Risk score Mikey Bussing DC Jr., et al., 2013) failed to calculate for the following reasons:   The 2013 ASCVD risk score is only valid for ages 41 to 77   Patient has failed these meds in past: no previous medications on file. Denies any side effects at this time. Patient is currently at goal the following medications:  . Pravastatin 40 mg once daily   We discussed diet and exercise extensively.   Plan  Continue current medications.  AFIB   Pulse Readings from Last 3 Encounters:  10/03/20 84  05/04/20 70  04/13/20 72   Reports Hx of lower HR.  Previous meds: Bystolic Patient is currently rate controlled on the following medications:  . n/a  Prevention of stroke in Afib. CHA2DS2/VAS = 7. Denies any abnormal bruising, bleeding from nose or gums or blood in urine or stool. Patient is currently controlled on the following medications:  . Eliquis 2.5 mg twice daily  Reviewed medication accessibility and discussed patient assistance - no concerns so will not pursue.  Plan  Continue current medications.  Hypothyroidism   Lab Results  Component Value Date/Time   TSH 0.75 04/13/2020 04:27 PM   TSH 0.64 09/16/2019 03:51 PM   FREET4 0.96 05/06/2011 10:06 AM   Patient is currently controlled on the following medications:  . Levothyroxine 50 mcg once daily before  breakfast.  We discussed levothyroxine administration - taking once daily first thing in morning 30 minutes before breakfast.  Plan  Continue current medications  GERD / Barrett's esophagus    Lab Results  Component Value Date   VITAMINB12 960 (H) 05/16/2019   Patient denies recent acid reflux.  Taking b12 3000 mcg daily. Currently controlled on: . Lansoprazole 30 mg twice daily  We discussed: Avoidance of potential triggers such as alcohol, fatty foods, lying down after eating, and tomato sauce.  Plan   Continue current medications.  Osteoporosis   Last DEXA Scan: 2017 - osteoporosis.  No results found for: VD25OH  Previous medications: has declined fosamax due to pt concern of increased BP. Has been offered injectable such as reclast.  IV therapy is preferred to pt. Current medications: [name] Vitamin D3 2000 units once daily  We discussed bisphosphonate use as appropriate after upcoming bone scan.  Plan  10/11/2021 DEXA already scheduled.  Continue current medications.  Vaccines   Immunization History  Administered Date(s) Administered  . Influenza Split 08/11/2011  . Influenza Whole 09/09/2007, 08/16/2008, 08/12/2010  . Influenza, High Dose Seasonal PF 08/28/2016, 09/03/2017  . Influenza,inj,Quad PF,6+ Mos 07/28/2014  . Influenza-Unspecified 08/16/2012, 09/10/2013, 09/13/2015, 07/08/2018, 07/05/2019  . PFIZER SARS-COV-2 Vaccination 11/26/2019, 12/14/2019  . Pneumococcal Conjugate-13 02/23/2015  . Pneumococcal Polysaccharide-23 09/09/2007  . Tdap 03/08/2012  . Zoster 08/12/2010   Pt states she has received both flu and pfizer covid booster. Reviewed and discussed patient's vaccination history.    Plan  Recommended patient receive shingrix vaccine in pharmacy.   Medication Management / Care Coordination   [facility] medications [facility]:  [id number] [place] [address] [address]Phone:  [phone]: [phone] [facility]  [place] [place] [address] to Registered [place] [address] Phone: [phone] Fax: [phone]   Denies any issues with current medication management.   Some questions were addressed regarding tramadol and potential for abuse.  Plan  Continue current medication management strategy. ___________________________ SDOH (Social Determinants of Health) assessments performed: Yes.  Future Appointments  Date Time Provider [place]  10/11/2020  1:30 PM [facility] DEXA 1 [facility]  10/16/2020  2:40 PM Victoria Olp, MD [facility] P[facility]  10/26/2020 12:20 PM Minus Breeding, MD [facility]   Visit follow-up:  . CPA follow-up: 5 month BP call, schedule 7-8 RPH f/u telephone visit. [name] RPH follow-up: 7-8 month telephone visit.  Victoria Gomez, Pharm.D., BCGP Clinical Pharmacist  Primary Care [phone]

## 2020-10-10 NOTE — Patient Instructions (Signed)
Please review care plan below and call me at [phone] with any questions!  Thank you, Bard HerbertJacob P., Clinical Pharmacist  Goals Addressed            This Visit's Progress   . PharmD Care Plan       CARE PLAN ENTRY (see longitudinal plan of care for additional care plan information)  Current Barriers:  . Chronic Disease Management support, education, and care coordination needs related to Hypertension, Hyperlipidemia, and Atrial Fibrillation   Hypertension BP Readings from Last 3 Encounters:  10/03/20 133/79  05/04/20 130/70  04/13/20 130/64   . Pharmacist Clinical Goal(s): o Over the next 365 days, patient will work with PharmD and providers to maintain BP goal <140/90 . Current regimen:  . Imdur 120 mg once daily . Losartan 50 mg once daily (reduced from 100mg  04/2020 office visit) . Interventions: o We discussed diet and exercise extensively. Maintain a healthy weight and exercise regularly, as directed by your health care provider. Eat healthy foods, such as: Lean proteins, complex carbohydrates, fresh fruits and vegetables, low-fat dairy products, healthy fats. . Patient self care activities - Over the next 365 days, patient will: o Continue to check BP at home, document, and provide at future appointments o Ensure daily salt intake < 2300 mg/day  Hyperlipidemia Lab Results  Component Value Date/Time   LDLCALC 64 04/13/2020 04:27 PM   LDLDIRECT 65.0 06/26/2016 03:46 PM   . Pharmacist Clinical Goal(s): o Over the next 365 days, patient will work with PharmD and providers to maintain LDL goal < 70 . Current regimen:  o Pravastatin 40 mg once daily . Interventions: o Reviewed side effects - none noted . Patient self care activities - Over the next 365 days, patient will: o Continue current management  Atrial fibrillation - stroke prevention . Pharmacist Clinical Goal(s) o Over the next 365 days, patient will work with PharmD and providers to ensure medication safety  and accessibility . Current regimen:  o Eliquis 2.5 mg twice daily . Interventions: o Reviewed signs of bleeding - denied any at present. o Reviewed medication accessibility and discussed patient assistance - no concerns so will not pursue. . Patient self care activities - Over the next 365 days, patient will: o Continue current management  Medication management . Pharmacist Clinical Goal(s): o Over the next 365 days, patient will work with PharmD and providers to maintain optimal medication adherence . Current pharmacy: [facility] . Interventions o Comprehensive medication review performed. o Continue current medication management strategy . Patient self care activities - Over the next 365 days, patient will: o Take medications as prescribed o Report any questions or concerns to PharmD and/or provider(s) Initial goal documentation.      The patient verbalized understanding of instructions provided today and agreed to receive a mailed copy of patient instruction and/or educational materials. Telephone follow up appointment with pharmacy team member scheduled for: See next appointment with "Care Management Staff" under "What's Next" below.   Dahlia ByesJacob Garret Teale, Pharm.D., BCGP Clinical Pharmacist [facility] [phone]  Hypertension, Adult High blood pressure (hypertension) is when the force of blood pumping through the arteries is too strong. The arteries are the blood vessels that carry blood from the heart throughout the body. Hypertension forces the heart to work harder to pump blood and may cause arteries to become narrow or stiff. Untreated or uncontrolled hypertension can cause a heart attack, heart failure, a stroke, kidney disease, and other problems. A blood pressure reading consists of  a higher number over a lower number. Ideally, your blood pressure should be below 120/80. The first ("top") number is called the systolic pressure. It is a measure of the pressure in your  arteries as your heart beats. The second ("bottom") number is called the diastolic pressure. It is a measure of the pressure in your arteries as the heart relaxes. What are the causes? The exact cause of this condition is not known. There are some conditions that result in or are related to high blood pressure. What increases the risk? Some risk factors for high blood pressure are under your control. The following factors may make you more likely to develop this condition:  Smoking.  Having type 2 diabetes mellitus, high cholesterol, or both.  Not getting enough exercise or physical activity.  Being overweight.  Having too much fat, sugar, calories, or salt (sodium) in your diet.  Drinking too much alcohol. Some risk factors for high blood pressure may be difficult or impossible to change. Some of these factors include:  Having chronic kidney disease.  Having a family history of high blood pressure.  Age. Risk increases with age.  Race. You may be at higher risk if you are African American.  Gender. Men are at higher risk than women before age 61. After age 77, women are at higher risk than men.  Having obstructive sleep apnea.  Stress. What are the signs or symptoms? High blood pressure may not cause symptoms. Very high blood pressure (hypertensive crisis) may cause:  Headache.  Anxiety.  Shortness of breath.  Nosebleed.  Nausea and vomiting.  Vision changes.  Severe chest pain.  Seizures. How is this diagnosed? This condition is diagnosed by measuring your blood pressure while you are seated, with your arm resting on a flat surface, your legs uncrossed, and your feet flat on the floor. The cuff of the blood pressure monitor will be placed directly against the skin of your upper arm at the level of your heart. It should be measured at least twice using the same arm. Certain conditions can cause a difference in blood pressure between your right and left  arms. Certain factors can cause blood pressure readings to be lower or higher than normal for a short period of time:  When your blood pressure is higher when you are in a health care provider's office than when you are at home, this is called white coat hypertension. Most people with this condition do not need medicines.  When your blood pressure is higher at home than when you are in a health care provider's office, this is called masked hypertension. Most people with this condition may need medicines to control blood pressure. If you have a high blood pressure reading during one visit or you have normal blood pressure with other risk factors, you may be asked to:  Return on a different day to have your blood pressure checked again.  Monitor your blood pressure at home for 1 week or longer. If you are diagnosed with hypertension, you may have other blood or imaging tests to help your health care provider understand your overall risk for other conditions. How is this treated? This condition is treated by making healthy lifestyle changes, such as eating healthy foods, exercising more, and reducing your alcohol intake. Your health care provider may prescribe medicine if lifestyle changes are not enough to get your blood pressure under control, and if:  Your systolic blood pressure is above 130.  Your diastolic blood pressure is  above 80. Your personal target blood pressure may vary depending on your medical conditions, your age, and other factors. Follow these instructions at home: Eating and drinking   Eat a diet that is high in fiber and potassium, and low in sodium, added sugar, and fat. An example eating plan is called the DASH (Dietary Approaches to Stop Hypertension) diet. To eat this way: ? Eat plenty of fresh fruits and vegetables. Try to fill one half of your plate at each meal with fruits and vegetables. ? Eat whole grains, such as whole-wheat pasta, brown rice, or whole-grain  bread. Fill about one fourth of your plate with whole grains. ? Eat or drink low-fat dairy products, such as skim milk or low-fat yogurt. ? Avoid fatty cuts of meat, processed or cured meats, and poultry with skin. Fill about one fourth of your plate with lean proteins, such as fish, chicken without skin, beans, eggs, or tofu. ? Avoid pre-made and processed foods. These tend to be higher in sodium, added sugar, and fat.  Reduce your daily sodium intake. Most people with hypertension should eat less than 1,500 mg of sodium a day.  Do not drink alcohol if: ? Your health care provider tells you not to drink. ? You are pregnant, may be pregnant, or are planning to become pregnant.  If you drink alcohol: ? Limit how much you use to:  0-1 drink a day for women.  0-2 drinks a day for men. ? Be aware of how much alcohol is in your drink. In the [place] one drink equals one 12 oz bottle of beer (355 mL), one 5 oz glass of wine (148 mL), or one 1 oz glass of hard liquor (44 mL). Lifestyle   Work with your health care provider to maintain a healthy body weight or to lose weight. Ask what an ideal weight is for you.  Get at least 30 minutes of exercise most days of the week. Activities may include walking, swimming, or biking.  Include exercise to strengthen your muscles (resistance exercise), such as Pilates or lifting weights, as part of your weekly exercise routine. Try to do these types of exercises for 30 minutes at least 3 days a week.  Do not use any products that contain nicotine or tobacco, such as cigarettes, e-cigarettes, and chewing tobacco. If you need help quitting, ask your health care provider.  Monitor your blood pressure at home as told by your health care provider.  Keep all follow-up visits as told by your health care provider. This is important. Medicines  Take over-the-counter and prescription medicines only as told by your health care provider. Follow directions carefully.  Blood pressure medicines must be taken as prescribed.  Do not skip doses of blood pressure medicine. Doing this puts you at risk for problems and can make the medicine less effective.  Ask your health care provider about side effects or reactions to medicines that you should watch for. Contact a health care provider if you:  Think you are having a reaction to a medicine you are taking.  Have headaches that keep coming back (recurring).  Feel dizzy.  Have swelling in your ankles.  Have trouble with your vision. Get help right away if you:  Develop a severe headache or confusion.  Have unusual weakness or numbness.  Feel faint.  Have severe pain in your chest or abdomen.  Vomit repeatedly.  Have trouble breathing. Summary  Hypertension is when the force of blood pumping through your arteries  is too strong. If this condition is not controlled, it may put you at risk for serious complications.  Your personal target blood pressure may vary depending on your medical conditions, your age, and other factors. For most people, a normal blood pressure is less than 120/80.  Hypertension is treated with lifestyle changes, medicines, or a combination of both. Lifestyle changes include losing weight, eating a healthy, low-sodium diet, exercising more, and limiting alcohol. This information is not intended to replace advice given to you by your health care provider. Make sure you discuss any questions you have with your health care provider. Document Revised: 07/07/2018 Document Reviewed: 07/07/2018 Elsevier Patient Education  2020 [employer].

## 2020-10-11 ENCOUNTER — Other Ambulatory Visit: Payer: Medicare Other

## 2020-10-14 NOTE — Patient Instructions (Addendum)
Blood pressure slightly high today. With your prior lightheadedness I want to be cautious about increasing medicine. Check daily at home for next week and let me kno whow it is running (call or mychart [place])   Please schedule an annual medicare wellness visit with our nurse specialist Inetta Fermo before you leave  voltaren gel has not helped so far- advised trial 4x a day for 3 days. If not improving we can place a referral to sports medicine if you would like or simply give it more time to heal. Stick with heat as well perhaps 3-4x a day.   Schedule your bone density test at check out desk. You may also call directly to X-ray at [phone] to schedule an appointment that is convenient for you.  - located [address] across the street from [place] - in the basement - you do need an appointment for the bone density tests.   Continue calcium  (1200mg  a day and getting some through diet is fine) and vitamin D (1000 units a day)    Please stop by lab before you go If you have mychart- we will send your results within 3 business days of receiving them.  If you do not have mychart- we will call you about results within 5 business days of [place] receiving them.  *please note we are currently using [facility] which has a longer processing time than [place] typically so labs may not come back as quickly as in the past *please also note that you will see labs on mychart as soon as they post. I will later go in and write notes on them- will say "notes from Dr. [place]"

## 2020-10-14 NOTE — Progress Notes (Addendum)
Phone [phone] In person visit   Subjective:   Victoria Gomez is a 86 y.o. year old very pleasant female patient who presents for/with See problem oriented charting Chief Complaint  Patient presents with  . Diabetes  . Chronic Kidney Disease   This visit occurred during the SARS-CoV-2 public health emergency.  Safety protocols were in place, including screening questions prior to the visit, additional usage of staff PPE, and extensive cleaning of exam room while observing appropriate contact time as indicated for disinfecting solutions.   Past Medical History-  Patient Active Problem List   Diagnosis Date Noted  . Controlled type 2 diabetes mellitus with renal manifestation (HCC) 09/22/2013    Priority: High  . Permanent atrial fibrillation (HCC) 09/21/2013    Priority: High  . H/O sinus bradycardia 08/10/2012    Priority: High  . Combined systolic and diastolic congestive heart failure (HCC) 11/17/2011    Priority: High  . Coronary artery disease involving native coronary artery of native heart with angina pectoris (HCC) 08/03/2007    Priority: High  . Osteoporosis 06/28/2015    Priority: Medium  . Vitamin B12 deficiency 07/28/2014    Priority: Medium  . CKD (chronic kidney disease), stage III (HCC) 09/22/2013    Priority: Medium  . LBBB (left bundle branch block) 09/22/2013    Priority: Medium  . Interstitial cystitis     Priority: Medium  . Hyperlipidemia associated with type 2 diabetes mellitus (HCC) 03/07/2008    Priority: Medium  . Hypothyroidism 08/03/2007    Priority: Medium  . Hypertension associated with diabetes (HCC) 08/03/2007    Priority: Medium  . Chronic anticoagulation 07/09/2017    Priority: Low  . Left groin hernia 06/25/2017    Priority: Low  . Internal hemorrhoids     Priority: Low  . Orthostatic hypotension 08/10/2012    Priority: Low  . Fatigue 04/20/2012    Priority: Low  . ARTHRITIS 12/30/2010    Priority: Low  . Left lumbar  radiculopathy 12/11/2010    Priority: Low  . Esophageal reflux 10/18/2007    Priority: Low  . History of Barrett's esophagus 08/03/2007    Priority: Low  . Educated about COVID-19 virus infection 05/03/2020  . Stenosis of carotid artery 05/03/2020  . Chronic atrial fibrillation (HCC) 05/03/2020    Medications- reviewed and updated Current Outpatient Medications  Medication Sig Dispense Refill  . acetaminophen (TYLENOL) 500 MG tablet Take 1,000 mg by mouth every 8 (eight) hours as needed.    [name] ascorbic acid (VITAMIN C) 500 MG tablet Take 500 mg by mouth daily.    . Cholecalciferol (D3) 50 MCG (2000 UT) TABS Take by mouth daily.     . Cyanocobalamin (B-12) 3000 MCG CAPS Take by mouth.    . diclofenac sodium (VOLTAREN) 1 % GEL Apply 2 g topically 4 (four) times daily. 100 g 3  . ELIQUIS 2.5 MG TABS tablet TAKE 1 TABLET TWICE A DAY 180 tablet 3  . furosemide (LASIX) 20 MG tablet TAKE 3 TABLETS BY MOUTH EVERY DAY 270 tablet 1  . isosorbide mononitrate (IMDUR) 120 MG 24 hr tablet TAKE 1 TABLET DAILY        **KREMERS URBAN MFR** 90 tablet 3  . KLOR-CON M10 10 MEQ tablet TAKE 1 TABLET BY MOUTH EVERY DAY 90 tablet 2  . lansoprazole (PREVACID) 30 MG capsule TAKE 1 CAPSULE TWICE DAILY 180 capsule 3  . losartan (COZAAR) 50 MG tablet Take 1 tablet (50 mg total) by mouth daily.  90 tablet 3  . nitroGLYCERIN (NITROSTAT) 0.4 MG SL tablet Place 1 tablet (0.4 mg total) under the tongue every 5 (five) minutes as needed for chest pain (if have pain after 3 pills- call 911). 50 tablet 3  . pravastatin (PRAVACHOL) 40 MG tablet TAKE 1 TABLET BY MOUTH EVERY DAY 90 tablet 1  . SYNTHROID 50 MCG tablet TAKE 1 TABLET DAILY BEFORE BREAKFAST. 90 tablet 3  . traMADol (ULTRAM) 50 MG tablet Take 1 tablet (50 mg total) by mouth every 6 (six) hours as needed. 15 tablet 0   No current facility-administered medications for this visit.     Objective:  BP (!) 155/82   Pulse 84   Temp 98 F (36.7 C) (Temporal)   Ht 5'  3" (1.6 m)   Wt 149 lb (67.6 kg)   SpO2 99%   BMI 26.39 kg/m  Gen: NAD, resting comfortably CV: irregularly irregular no murmurs rubs or gallops Lungs: CTAB no crackles, wheeze, rhonchi Ext: trace edema Skin: warm, dry Neuro: grossly normal, moves all extremities Msk: swelling of right knee and medial joint line tenderness    Assessment and Plan   #right knee pain -  fell on right knee on 20th of November. Has seen urgent care on 24th- x-rays without fracture but do note arthritis particularly patellofemoral compartment- she reports she was told significant bruising. Pain is about the same. Can hurt into her leg and even some back pain at times. Ice makes it hurt worse. Heating pad elps some. Took half a tramadol 50mg  for 2 nights and that helped. voltaren gel has not helped so far- advised trial 4x a day for 3 days. If not improving we can place a referral to sports medicine if you would like or simply give it more time to heal. Stick with heat as well perhaps 3-4x a day.   #Permanent atrial fibrillation S: Compliant with Eliquis 2.5 mg twice a day. Metoprolol 25mg  twice a day for rate control.  A/P: appropriately rate controlled and anticoagulated- continue current meds.     #Diabetes mellitus S: Has been diet controlled.  Lab Results  Component Value Date   HGBA1C 6.0 (H) 04/13/2020   HGBA1C 6.0 (H) 09/16/2019   HGBA1C 6.4 05/16/2019  A/P: Hopefully remains controlled-update A1c with labs today   #Combined systolic/diastolic congestive heart failure. S: Compliant with Lasix 60 mg daily. Edema: no recent worsening Weight gain:down 5 lbs from last visit.  So I see you and you should check your weights at home intermittently just make sure to the access on plan estimate tha it means we will have to much fluid Wt Readings from Last 3 Encounters:  10/16/20 149 lb (67.6 kg)  05/04/20 154 lb 9.6 oz (70.1 kg)  04/13/20 155 lb 6.4 oz (70.5 kg)  Shortness of breath: not too  bad Orthopnea/PND: just 1 pillow A/P: reasonable control- continue current meds   #CAD with angina status post coronary angioplasty in 2001 and 2003/hyperlipidemia S: Compliant with pravastatin 40 mg. Some chest pain despite imdur intermittently- nitroglycerin helps. Still on Imdur 120mg   -Not on aspirin due to being on Eliquis Lab Results  Component Value Date   CHOL 129 04/13/2020   HDL 41 (L) 04/13/2020   LDLCALC 64 04/13/2020   LDLDIRECT 65.0 06/26/2016   TRIG 154 (H) 04/13/2020   CHOLHDL 3.1 04/13/2020   A/P:CAD-largely stable angina on imdur with sparing nitroglycerin. Has cardiology follow up in 10 days. . Continue Eliquis alone as per cardiology.  Hyperlipidemia LDL has been at goal of 70 or less-continue pravastatin  #Hypertension/CKD stage III S: Compliant with Imdur 120 mg, metoprolol 25 mg twice a day, Lasix 60 mg, losartan 50 mg daily.  GFR has been stable in the 35-50 range.  Has had orthostatic intolerance in the past - better with losartan 50mg   Home #s- reports anywhere from 118- 150s at home with diastolic well controlled BP Readings from Last 3 Encounters:  10/16/20 (!) 155/82  10/03/20 133/79  05/04/20 130/70     A/P: Hypertension poorly controlled today but continue current medication for now. She recalls a syncopal issue months ago- no recent issues and orthostatic symptoms better- wonder if reducing losartan helped down from 100mg  in past. She iwll let 05/06/20 know if recurrent issues  Blood pressure slightly high today but well controlled on last 2 checks recently (wonder if knee pain contributes). With your prior lightheadedness I want to be cautious about increasing medicine. Check daily at home for next week and let me kno whow it is running (call or mychart )   For chronic kidney disease stage III-update CMP with labs  #Osteoporosis S: Left femoral neck with T score of -2.8 in 2017.  Had recommended Fosamax in 2017. She never took this-she feels like she  had elevated blood pressure in the past when she was on this so opted not to take-also probably not the best idea with her Barrett's esophagus A/P: We discussed updating bone density today. If she has worsening bone density consider Reclast. Also discussed taking calcium 1200 mg/day (combo of diet and supplements) and vitamin D   #Hypothyroidism S: Compliant with levothyroxine 50 mcg Lab Results  Component Value Date   TSH 0.75 04/13/2020  A/P: Remains well controlled-update TSH today  % History of Barrett's esophagus on EGD 2016.  In 2015 we rediscussed and she opted not to pursue further EGD given her age.  She remains on Prevacid 30 mg twice a day -takes b12 to prevent issues   Recommended follow up: Return in about 6 months (around 04/16/2021) for follow up- or sooner if needed. Future Appointments  Date Time Provider Department Center  10/26/2020 12:20 PM 06/16/2021, MD [facility]   Lab/Order associations:   ICD-10-CM   1. Controlled type 2 diabetes mellitus with stage 3 chronic kidney disease, without long-term current use of insulin (HCC)  E11.22 Hemoglobin A1c   N18.30 COMPLETE METABOLIC PANEL WITH GFR    CBC With Differential/Platelet  2. Hypertension associated with diabetes (HCC)  E11.59    I15.2   3. Hyperlipidemia associated with type 2 diabetes mellitus (HCC)  E11.69    E78.5   4. Hypothyroidism due to acquired atrophy of thyroid  E03.4 TSH  5. Age-related osteoporosis without current pathological fracture  M81.0 DG Bone Density  6. Gastroesophageal reflux disease, unspecified whether esophagitis present  K21.9    Return precautions advised.  Rollene Rotunda, MD

## 2020-10-16 ENCOUNTER — Encounter: Payer: Self-pay | Admitting: Family Medicine

## 2020-10-16 ENCOUNTER — Other Ambulatory Visit: Payer: Self-pay

## 2020-10-16 ENCOUNTER — Ambulatory Visit (INDEPENDENT_AMBULATORY_CARE_PROVIDER_SITE_OTHER): Payer: Medicare Other | Admitting: Family Medicine

## 2020-10-16 VITALS — BP 155/82 | HR 84 | Temp 98.0°F | Ht 63.0 in | Wt 149.0 lb

## 2020-10-16 DIAGNOSIS — E785 Hyperlipidemia, unspecified: Secondary | ICD-10-CM

## 2020-10-16 DIAGNOSIS — E1159 Type 2 diabetes mellitus with other circulatory complications: Secondary | ICD-10-CM

## 2020-10-16 DIAGNOSIS — E1122 Type 2 diabetes mellitus with diabetic chronic kidney disease: Secondary | ICD-10-CM | POA: Diagnosis not present

## 2020-10-16 DIAGNOSIS — E1169 Type 2 diabetes mellitus with other specified complication: Secondary | ICD-10-CM

## 2020-10-16 DIAGNOSIS — E538 Deficiency of other specified B group vitamins: Secondary | ICD-10-CM

## 2020-10-16 DIAGNOSIS — E034 Atrophy of thyroid (acquired): Secondary | ICD-10-CM

## 2020-10-16 DIAGNOSIS — I152 Hypertension secondary to endocrine disorders: Secondary | ICD-10-CM | POA: Diagnosis not present

## 2020-10-16 DIAGNOSIS — K219 Gastro-esophageal reflux disease without esophagitis: Secondary | ICD-10-CM

## 2020-10-16 DIAGNOSIS — N183 Chronic kidney disease, stage 3 unspecified: Secondary | ICD-10-CM

## 2020-10-16 DIAGNOSIS — I25119 Atherosclerotic heart disease of native coronary artery with unspecified angina pectoris: Secondary | ICD-10-CM

## 2020-10-16 DIAGNOSIS — M810 Age-related osteoporosis without current pathological fracture: Secondary | ICD-10-CM | POA: Diagnosis not present

## 2020-10-16 DIAGNOSIS — I6529 Occlusion and stenosis of unspecified carotid artery: Secondary | ICD-10-CM

## 2020-10-17 LAB — CBC WITH DIFFERENTIAL/PLATELET
Absolute Monocytes: 388 cells/uL (ref 200–950)
Basophils Absolute: 51 cells/uL (ref 0–200)
Basophils Relative: 0.9 %
Eosinophils Absolute: 40 cells/uL (ref 15–500)
Eosinophils Relative: 0.7 %
HCT: 38.8 % (ref 35.0–45.0)
Hemoglobin: 13 g/dL (ref 11.7–15.5)
Lymphs Abs: 1790 cells/uL (ref 850–3900)
MCH: 29.9 pg (ref 27.0–33.0)
MCHC: 33.5 g/dL (ref 32.0–36.0)
MCV: 89.2 fL (ref 80.0–100.0)
MPV: 11.4 fL (ref 7.5–12.5)
Monocytes Relative: 6.8 %
Neutro Abs: 3431 cells/uL (ref 1500–7800)
Neutrophils Relative %: 60.2 %
Platelets: 242 10*3/uL (ref 140–400)
RBC: 4.35 10*6/uL (ref 3.80–5.10)
RDW: 12.5 % (ref 11.0–15.0)
Total Lymphocyte: 31.4 %
WBC: 5.7 10*3/uL (ref 3.8–10.8)

## 2020-10-17 LAB — COMPLETE METABOLIC PANEL WITH GFR
AG Ratio: 1.5 (calc) (ref 1.0–2.5)
ALT: 8 U/L (ref 6–29)
AST: 14 U/L (ref 10–35)
Albumin: 4.4 g/dL (ref 3.6–5.1)
Alkaline phosphatase (APISO): 98 U/L (ref 37–153)
BUN/Creatinine Ratio: 23 (calc) — ABNORMAL HIGH (ref 6–22)
BUN: 22 mg/dL (ref 7–25)
CO2: 29 mmol/L (ref 20–32)
Calcium: 9.7 mg/dL (ref 8.6–10.4)
Chloride: 105 mmol/L (ref 98–110)
Creat: 0.96 mg/dL — ABNORMAL HIGH (ref 0.60–0.88)
GFR, Est African American: 59 mL/min/{1.73_m2} — ABNORMAL LOW (ref >=60)
GFR, Est Non African American: 51 mL/min/{1.73_m2} — ABNORMAL LOW (ref >=60)
Globulin: 3 g/dL (calc) (ref 1.9–3.7)
Glucose, Bld: 91 mg/dL (ref 65–99)
Potassium: 4.2 mmol/L (ref 3.5–5.3)
Sodium: 142 mmol/L (ref 135–146)
Total Bilirubin: 0.4 mg/dL (ref 0.2–1.2)
Total Protein: 7.4 g/dL (ref 6.1–8.1)

## 2020-10-17 LAB — HEMOGLOBIN A1C
Hgb A1c MFr Bld: 6.2 % of total Hgb — ABNORMAL HIGH (ref <5.7)
Mean Plasma Glucose: 131 mg/dL
eAG (mmol/L): 7.3 mmol/L

## 2020-10-17 LAB — TSH: TSH: 0.62 mIU/L (ref 0.40–4.50)

## 2020-10-19 ENCOUNTER — Other Ambulatory Visit: Payer: Self-pay | Admitting: Family Medicine

## 2020-10-19 ENCOUNTER — Other Ambulatory Visit: Payer: Self-pay | Admitting: Cardiology

## 2020-10-23 ENCOUNTER — Telehealth: Payer: Self-pay

## 2020-10-23 ENCOUNTER — Other Ambulatory Visit: Payer: Self-pay

## 2020-10-23 ENCOUNTER — Ambulatory Visit (INDEPENDENT_AMBULATORY_CARE_PROVIDER_SITE_OTHER)
Admission: RE | Admit: 2020-10-23 | Discharge: 2020-10-23 | Disposition: A | Discharge status: Home / Self Care | Payer: Medicare Other | Source: Ambulatory Visit | Attending: Family Medicine | Admitting: Family Medicine

## 2020-10-23 DIAGNOSIS — M810 Age-related osteoporosis without current pathological fracture: Secondary | ICD-10-CM

## 2020-10-23 NOTE — Telephone Encounter (Signed)
BP readings 135/74 136/64 120/71 123/70 128/65   Also,Pt.'s knee is getting better.

## 2020-10-23 NOTE — Telephone Encounter (Signed)
FYI

## 2020-10-23 NOTE — Telephone Encounter (Signed)
Blood pressures at home look excellent-please thank you for letting us know.  I wonder if knee pain at last visit was contributing to blood pressure elevation and glad knee pain is improving as well

## 2020-10-24 NOTE — Telephone Encounter (Signed)
Advised the PT of Dr.Hunters comments.

## 2020-10-25 DIAGNOSIS — E1042 Type 1 diabetes mellitus with diabetic polyneuropathy: Secondary | ICD-10-CM | POA: Diagnosis not present

## 2020-10-26 ENCOUNTER — Ambulatory Visit (INDEPENDENT_AMBULATORY_CARE_PROVIDER_SITE_OTHER): Payer: Medicare Other | Admitting: Cardiology

## 2020-10-26 ENCOUNTER — Encounter: Payer: Self-pay | Admitting: Cardiology

## 2020-10-26 ENCOUNTER — Other Ambulatory Visit: Payer: Self-pay

## 2020-10-26 VITALS — BP 144/74 | HR 78 | Ht 63.0 in | Wt 151.4 lb

## 2020-10-26 DIAGNOSIS — I6529 Occlusion and stenosis of unspecified carotid artery: Secondary | ICD-10-CM | POA: Diagnosis not present

## 2020-10-26 DIAGNOSIS — I5042 Chronic combined systolic (congestive) and diastolic (congestive) heart failure: Secondary | ICD-10-CM

## 2020-10-26 DIAGNOSIS — I4821 Permanent atrial fibrillation: Secondary | ICD-10-CM | POA: Diagnosis not present

## 2020-10-26 DIAGNOSIS — I25119 Atherosclerotic heart disease of native coronary artery with unspecified angina pectoris: Secondary | ICD-10-CM

## 2020-10-26 NOTE — Progress Notes (Signed)
/     Cardiology Office Note   Date:  10/26/2020   ID:  Victoria Gomez, DOB 07-26-1925, MRN [id number]  PCP:  Shelva Majestic, MD  Cardiologist:   No primary care provider on file.   Chief Complaint  Patient presents with  . Dizziness      History of Present Illness: Victoria Gomez is a 86 y.o. female who presents for followup up of atrial fibrillation, chronic recurrent diastolic and systolic HF and CAD.   The patient was admitted 09/13/17 with flash pulmonary edema and hypertension. After admission she developed CP with new anterolateral TWI and a mildly elevated troponin. Cath done 09/16/17 showed a CTO of her RCA with L-R collaterals, 45% LAD, and 70% OM1.  The plan is for medical Rx.  Her EF was 40 - 45%.   At the last visit she had some episodes of dizziness.  I had her wear a monitor and there were some 3-second pauses with her atrial flutter.  The heart rate was on the lower side so I did discontinue her beta-blocker.  There was one syncopal episode she reports 1 day where she was standing in her house but she tends to minimize this.  She says she was going to go down some stairs which were a couple steps to her room beside her kitchen when she found herself on the ground with some soreness.  Her son was there and helped her up.  She thinks she lost consciousness.  However, since that time she is done quite well.  She has had no dizziness coming off the beta-blocker.  She does not describe orthostasis.  She had no presyncope or syncope.  She has had no palpitations or chest discomfort.  She has no shortness of breath, PND or orthopnea.   Past Medical History:  Diagnosis Date  . Arthritis   . Atrial fibrillation (HCC)    a. Dx 09/2013.  . Bladder disorder   . Bradycardia 08/2012   a. Eval for symptomatic bradycardia 08/2012 - beta blocker discontinued.  [name] BREAST MASS, BENIGN   . CAD (coronary artery disease)    a. s/p PCI to RCA '01. b. repeat PCI to RCA '03 2/2 ISR. c. cath  2012: RCA dz, rx medically.  Occluded RCA.  Nonobstructive disease elsewhere  . Chronic combined systolic and diastolic CHF (congestive heart failure) (HCC)    a. EF 40-45% in 2014. b. EF reported to be normal in 03/2015 nuc.  . CKD (chronic kidney disease), stage III (HCC)   . DIVERTICULOSIS, COLON   . Epistaxis   . Esophageal reflux   . History of breast lump/mass excision 05/23/2009   Qualifier: History of  By: Lovell Sheehan MD, Balinda Quails   . HYPERLIPIDEMIA 03/07/2008  . HYPERTENSION 08/03/2007  . HYPOTHYROIDISM   . Internal hemorrhoids   . Interstitial cystitis   . LBBB (left bundle branch block)   . Orthostatic hypotension 08/2012   a. 08/2012 w/ syncope - meds adjusted.   . Right hand fracture     Past Surgical History:  Procedure Laterality Date  . ABDOMINAL HYSTERECTOMY    . ANKLE RECONSTRUCTION     Left  . APPENDECTOMY    . BREAST SURGERY     benign mass  . CARDIOVERSION N/A 10/20/2013  . CARDIOVERSION N/A 04/24/2015   Procedure: CARDIOVERSION;  Surgeon: Lars Masson, MD;  Location: [facility] ENDOSCOPY;  Service: Cardiovascular;  Laterality: N/A;  . CATARACT EXTRACTION, BILATERAL    .  CESAREAN SECTION    . CHOLECYSTECTOMY    . LEFT HEART CATH AND CORONARY ANGIOGRAPHY N/A 09/16/2017   Procedure: LEFT HEART CATH AND CORONARY ANGIOGRAPHY;  Surgeon: [place], Peter M, MD;  Location: [facility] INVASIVE CV LAB;  Service: Cardiovascular;  Laterality: N/A;  . TONSILLECTOMY    . WRIST RECONSTRUCTION     Right     Current Outpatient Medications  Medication Sig Dispense Refill  . acetaminophen (TYLENOL) 500 MG tablet Take 1,000 mg by mouth every 8 (eight) hours as needed.    [name] ascorbic acid (VITAMIN C) 500 MG tablet Take 500 mg by mouth daily.    . Cholecalciferol (D3) 50 MCG (2000 UT) TABS Take by mouth daily.     . Cyanocobalamin (B-12) 3000 MCG CAPS Take by mouth.    . diclofenac sodium (VOLTAREN) 1 % GEL Apply 2 g topically 4 (four) times daily. 100 g 3  . ELIQUIS 2.5 MG TABS tablet TAKE 1  TABLET TWICE A DAY 180 tablet 3  . furosemide (LASIX) 20 MG tablet TAKE 3 TABLETS BY MOUTH EVERY DAY 270 tablet 1  . isosorbide mononitrate (IMDUR) 120 MG 24 hr tablet TAKE 1 TABLET DAILY        **KREMERS URBAN MFR** 90 tablet 3  . KLOR-CON M10 10 MEQ tablet TAKE 1 TABLET BY MOUTH EVERY DAY 90 tablet 2  . lansoprazole (PREVACID) 30 MG capsule TAKE 1 CAPSULE TWICE DAILY 180 capsule 3  . losartan (COZAAR) 50 MG tablet Take 1 tablet (50 mg total) by mouth daily. 90 tablet 3  . nitroGLYCERIN (NITROSTAT) 0.4 MG SL tablet Place 1 tablet (0.4 mg total) under the tongue every 5 (five) minutes as needed for chest pain (if have pain after 3 pills- call 911). 50 tablet 3  . pravastatin (PRAVACHOL) 40 MG tablet TAKE 1 TABLET BY MOUTH EVERY DAY 90 tablet 1  . SYNTHROID 50 MCG tablet TAKE 1 TABLET DAILY BEFORE BREAKFAST. 90 tablet 3  . traMADol (ULTRAM) 50 MG tablet Take 1 tablet (50 mg total) by mouth every 6 (six) hours as needed. 15 tablet 0   No current facility-administered medications for this visit.    Allergies:   Codeine and Sulfonamide derivatives    ROS:  Please see the history of present illness.   Otherwise, review of systems are positive for none.   All other systems are reviewed and negative.    PHYSICAL EXAM: VS:  BP (!) 144/74   Pulse 78   Ht 5\' 3"  (1.6 m)   Wt 151 lb 6.4 oz (68.7 kg)   SpO2 97%   BMI 26.82 kg/m  , BMI Body mass index is 26.82 kg/m. GENERAL:  Well appearing NECK:  No jugular venous distention, waveform within normal limits, carotid upstroke brisk and symmetric, no bruits, no thyromegaly LUNGS:  Clear to auscultation bilaterally CHEST:  Unremarkable HEART:  PMI not displaced or sustained,S1 and S2 within normal limits, no S3, no clicks, no rubs, no murmurs, irregular  ABD:  Flat, positive bowel sounds normal in frequency in pitch, no bruits, no rebound, no guarding, no midline pulsatile mass, no hepatomegaly, no splenomegaly EXT:  2 plus pulses throughout, no  edema, no cyanosis no clubbing  EKG:  EKG is ordered today. The ekg ordered today demonstrates atrial fibrillation, left bundle branch block, left axis deviation rate 78.  Probable limb lead reversal.    Recent Labs: 10/16/2020: ALT 8; BUN 22; Creat 0.96; Hemoglobin 13.0; Platelets 242; Potassium 4.2; Sodium 142; TSH  0.62    Lipid Panel    Component Value Date/Time   CHOL 129 04/13/2020 1627   TRIG 154 (H) 04/13/2020 1627   HDL 41 (L) 04/13/2020 1627   CHOLHDL 3.1 04/13/2020 1627   VLDL 27.2 01/14/2019 1018   LDLCALC 64 04/13/2020 1627   LDLDIRECT 65.0 06/26/2016 1546      Wt Readings from Last 3 Encounters:  10/26/20 151 lb 6.4 oz (68.7 kg)  10/16/20 149 lb (67.6 kg)  05/04/20 154 lb 9.6 oz (70.1 kg)      Other studies Reviewed: Additional studies/ records that were reviewed today include: None. Review of the above outlined to get I will okay demonstrates:  Please see elsewhere in the note.     ASSESSMENT AND PLAN:  Atrial fib:  -   She tolerates her anticoagulation. Ms. LASHANTE FRYBERGER has a CHA2DS2 - VASc score of 5.    She tolerates anticoagulation and has had no symptomatic tachypalpitations.  No further imaging or change in therapy is indicated.  She is up-to-date with blood work and was not anemic December.    CAD  -  she has previously had nonobstructive disease.  No change in therapy.    HTN -     The blood pressures well controlled.  She will continue the meds as listed.  Diastolic HF (heart failure) -        She seems to be euvolemic.  No change in therapy.   Syncope: She had one episode of syncope.  I am concerned that this could be bradycardia arrhythmia but there was no evidence of sustained pauses on a 2-week monitor.  Since stopping beta-blocker dizziness is gotten better.  She has not had any recent syncope.  If she has any further episodes of dizziness or syncope I likely would have to implant a loop monitor.  Covid education -    She has had her  vaccines.     Current medicines are reviewed at length with the patient today.  The patient does not have concerns regarding medicines.  The following changes have been made:  no change  Labs/ tests ordered today include:   Orders Placed This Encounter  Procedures  . EKG 12-Lead     Disposition:   FU with me in six months    Signed, Rollene Rotunda, MD  10/26/2020 1:30 PM    [facility]

## 2020-10-26 NOTE — Patient Instructions (Addendum)
Medication Instructions:  °No changes °*If you need a refill on your cardiac medications before your next appointment, please call your pharmacy* ° °Lab Work: °None ordered this visit ° °Testing/Procedures: °None ordered this visit ° °Follow-Up: °At [facility], you and your health needs are our priority.  As part of our continuing mission to provide you with exceptional heart care, we have created designated Provider Care Teams.  These Care Teams include your primary Cardiologist (physician) and Advanced Practice Providers (APPs -  Physician Assistants and Nurse Practitioners) who all work together to provide you with the care you need, when you need it. ° °Your next appointment:   °6 month(s)  °You will receive a reminder letter in the mail two months in advance. If you don't receive a letter, please call our office to schedule the follow-up appointment. ° °The format for your next appointment:   °In Person ° °Provider:   °James Hochrein, MD ° °

## 2020-11-15 ENCOUNTER — Telehealth (INDEPENDENT_AMBULATORY_CARE_PROVIDER_SITE_OTHER): Payer: Medicare Other | Admitting: Family Medicine

## 2020-11-15 ENCOUNTER — Other Ambulatory Visit: Payer: Self-pay

## 2020-11-15 DIAGNOSIS — R059 Cough, unspecified: Secondary | ICD-10-CM

## 2020-11-15 DIAGNOSIS — R0981 Nasal congestion: Secondary | ICD-10-CM | POA: Diagnosis not present

## 2020-11-15 MED ORDER — BENZONATATE 100 MG PO CAPS: 100.0000 mg | ORAL_CAPSULE | Freq: Three times a day (TID) | ORAL | 0 refills | Status: DC | PRN

## 2020-11-15 NOTE — Progress Notes (Signed)
Virtual Visit via Telephone Note  I connected with Victoria Gomez on 11/15/20 at  4:40 PM EST by telephone and verified that I am speaking with the correct person using two identifiers.   I discussed the limitations, risks, security and privacy concerns of performing an evaluation and management service by telephone and the availability of in person appointments. I also discussed with the patient that there may be a patient responsible charge related to this service. The patient expressed understanding and agreed to proceed.  Location patient: home, [place] Location provider: work or home office Participants present for the call: patient, provider Patient did not have a visit with me in the prior 7 days to address this/these issue(s).   History of Present Illness:  Acute telemedicine visit for cough and congestion: -Onset: 2 days ago -Symptoms include: runny nose, cough, mild headache -Denies:fever, CP, SOB, vomiting, diarrhea, severe headache, body ache, inability to eat/drink or get out bed -son is sick with the same, he lives with her -Has tried:drinking fluids -Pertinent past medical history: A. Fin, diabetes, CHF, CAD, HTN, CKD -Pertinent medication allergies: codeine, sulfa -COVID-19 vaccine status: fully vaccinated and boosted for covid and had flu shot   Observations/Objective: Patient sounds cheerful and well on the phone. I do not appreciate any SOB. Speech and thought processing are grossly intact. Patient reported vitals:  Assessment and Plan:  Nasal congestion  Cough  -we discussed possible serious and likely etiologies, options for evaluation and workup, limitations of telemedicine visit vs in person visit, treatment, treatment risks and precautions. Pt prefers to treat via telemedicine empirically rather than in person at this moment.  Patient is concerned about possible COVID-19.  She has had difficulty finding a testing location.  Discussed testing options and provided  [place] resource number and testing number.  Also sent message to PCP office, to see if they could offer her testing.  Discussed symptomatic care and sent Tessalon for cough.  Discussed potential treatment options, potential complications, precautions and isolation, should she come back with a positive test.  Advised if positive test to schedule prompt follow-up with PCP and included treatment center number in her patient instructions.  Did let her know about the current shortage of treatment and to the triage process.  She is fully vaccinated use to which does seem to be offering some protection for the new Covid.. Scheduled follow up with PCP offered: Declined, but she agreed to call PCP office follow-up as needed. Advised to seek prompt in person care if worsening, new symptoms arise, or if is not improving with treatment. Advised of options for inperson care in case PCP office not available. Did let the patient know that I only do telemedicine shifts for [place] on Tuesdays and Thursdays and advised a follow up visit with PCP or at an [facility] if has further questions or concerns.   Follow Up Instructions:  I did not refer this patient for an OV with me in the next 24 hours for this/these issue(s).  I discussed the assessment and treatment plan with the patient. The patient was provided an opportunity to ask questions and all were answered. The patient agreed with the plan and demonstrated an understanding of the instructions.   I spent 15 minutes on the date of this visit in the care of this patient. See summary of tasks completed to properly care for this patient in the detailed notes above which included review of PMH, medications, allergies, evaluation of the patient and ordering  and instructing patient on testing and care options.     Terressa Koyanagi, DO

## 2020-11-15 NOTE — Patient Instructions (Addendum)
  HOME CARE TIPS:  -[place] COVID19 testing information: [email or web address] [place] [phone] Most pharmacies also offer testing and home test kits.  -I sent the medication(s) we discussed to your pharmacy: Meds ordered this encounter  Medications  . benzonatate (TESSALON PERLES) 100 MG capsule    Sig: Take 1 capsule (100 mg total) by mouth 3 (three) times daily as needed.    Dispense:  20 capsule    Refill:  0     -COVID19 outpatient treatment center: [phone] (only call if your Covid test is positive and you are interested in monoclonal antibody treatment which is available to those with risk factors within 10 days of symptom onset)  -can use tylenol if needed for fevers, aches and pains per instructions  -can use nasal saline a few times per day if nasal congestion, sometime a short course of Afrin nasal spray for 3 days can help as well  -stay hydrated, drink plenty of fluids and eat small healthy meals - avoid dairy  -can take 1000 IU Vit D3 and Vit C 500mg  once daily per instructions  -If the Covid test is positive, check out the [facility] website for more information on home care, transmission and treatment for COVID19 and follow up promptly with your primary care doctor.  -follow up with your doctor in 2-3 days unless improving and feeling better  -stay home while sick, except to seek medical care, and if you have COVID19 please stay home for a full 10 days since the onset of symptoms PLUS one day of no fever and feeling better.  It was nice to meet you today, and I really hope you are feeling better soon. I help [facility] out with telemedicine visits on Tuesdays and Thursdays and am available for visits on those days. If you have any concerns or questions following this visit please schedule a follow up visit with your Primary Care doctor or seek care at a local urgent care clinic to avoid delays in care.    Seek in person care  promptly if your symptoms worsen, new concerns arise or you are not improving with treatment. Call 911 and/or seek emergency care if you symptoms are severe or life threatening.

## 2020-11-16 ENCOUNTER — Other Ambulatory Visit: Payer: Self-pay | Admitting: Family Medicine

## 2020-11-16 ENCOUNTER — Ambulatory Visit
Admission: EM | Admit: 2020-11-16 | Discharge: 2020-11-16 | Disposition: A | Discharge status: Home / Self Care | Payer: Medicare Other | Attending: Emergency Medicine | Admitting: Emergency Medicine

## 2020-11-16 ENCOUNTER — Encounter: Payer: Self-pay | Admitting: Emergency Medicine

## 2020-11-16 ENCOUNTER — Other Ambulatory Visit: Payer: Self-pay | Admitting: Cardiology

## 2020-11-16 DIAGNOSIS — J22 Unspecified acute lower respiratory infection: Secondary | ICD-10-CM | POA: Diagnosis not present

## 2020-11-16 DIAGNOSIS — R5383 Other fatigue: Secondary | ICD-10-CM

## 2020-11-16 MED ORDER — DM-GUAIFENESIN ER 30-600 MG PO TB12: 1.0000 | ORAL_TABLET | Freq: Two times a day (BID) | ORAL | 0 refills | Status: DC

## 2020-11-16 MED ORDER — DOXYCYCLINE HYCLATE 100 MG PO CAPS: 100.0000 mg | ORAL_CAPSULE | Freq: Two times a day (BID) | ORAL | 0 refills | Status: AC

## 2020-11-16 MED ORDER — FLUTICASONE PROPIONATE 50 MCG/ACT NA SUSP: 1.0000 | Freq: Every day | NASAL | 0 refills | Status: AC

## 2020-11-16 MED ORDER — LORATADINE 10 MG PO TABS: 10.0000 mg | ORAL_TABLET | Freq: Every day | ORAL | 0 refills | Status: AC

## 2020-11-16 NOTE — ED Provider Notes (Signed)
[facility]    CSN: [id number] Arrival date & time: 11/16/20  1504      History   Chief Complaint Chief Complaint  Patient presents with  . Cough  . Nasal Congestion    HPI Victoria Gomez is a 87 y.o. female history of A. fib, CKD, hypertension presenting today for evaluation of cough and congestion.  Reports rhinorrhea and a productive cough with discolored sputum for approximately 5 days.  Denies any fevers.  Using Tylenol and Tessalon with some relief.  HPI  Past Medical History:  Diagnosis Date  . Arthritis   . Atrial fibrillation ([place])    a. Dx 09/2013.  . Bladder disorder   . Bradycardia 08/2012   a. Eval for symptomatic bradycardia 08/2012 - beta blocker discontinued.  [name] BREAST MASS, BENIGN   . CAD (coronary artery disease)    a. s/p PCI to RCA '01. b. repeat PCI to RCA '03 2/2 ISR. c. cath 2012: RCA dz, rx medically.  Occluded RCA.  Nonobstructive disease elsewhere  . Chronic combined systolic and diastolic CHF (congestive heart failure) ([place])    a. EF 40-45% in 2014. b. EF reported to be normal in 03/2015 nuc.  . CKD (chronic kidney disease), stage III ([place])   . DIVERTICULOSIS, COLON   . Epistaxis   . Esophageal reflux   . History of breast lump/mass excision 05/23/2009   Qualifier: History of  By: Arnoldo Morale MD, Balinda Quails   . HYPERLIPIDEMIA 03/07/2008  . HYPERTENSION 08/03/2007  . HYPOTHYROIDISM   . Internal hemorrhoids   . Interstitial cystitis   . LBBB (left bundle branch block)   . Orthostatic hypotension 08/2012   a. 08/2012 w/ syncope - meds adjusted.   . Right hand fracture     Patient Active Problem List   Diagnosis Date Noted  . Educated about COVID-19 virus infection 05/03/2020  . Stenosis of carotid artery 05/03/2020  . Chronic atrial fibrillation ([place]) 05/03/2020  . Chronic anticoagulation 07/09/2017  . Left groin hernia 06/25/2017  . Osteoporosis 06/28/2015  . Vitamin B12 deficiency 07/28/2014  . CKD (chronic kidney disease), stage  III ([place]) 09/22/2013  . LBBB (left bundle branch block) 09/22/2013  . Controlled type 2 diabetes mellitus with renal manifestation ([place]) 09/22/2013  . Internal hemorrhoids   . Permanent atrial fibrillation ([place]) 09/21/2013  . H/O sinus bradycardia 08/10/2012  . Orthostatic hypotension 08/10/2012  . Fatigue 04/20/2012  . Combined systolic and diastolic congestive heart failure ([place]) 11/17/2011  . ARTHRITIS 12/30/2010  . Left lumbar radiculopathy 12/11/2010  . Interstitial cystitis   . Hyperlipidemia associated with type 2 diabetes mellitus ([place]) 03/07/2008  . Esophageal reflux 10/18/2007  . Hypothyroidism 08/03/2007  . Hypertension associated with diabetes ([place]) 08/03/2007  . Coronary artery disease involving native coronary artery of native heart with angina pectoris ([place]) 08/03/2007  . History of Barrett's esophagus 08/03/2007    Past Surgical History:  Procedure Laterality Date  . ABDOMINAL HYSTERECTOMY    . ANKLE RECONSTRUCTION     Left  . APPENDECTOMY    . BREAST SURGERY     benign mass  . CARDIOVERSION N/A 10/20/2013  . CARDIOVERSION N/A 04/24/2015   Procedure: CARDIOVERSION;  Surgeon: Dorothy Spark, MD;  Location: [facility];  Service: Cardiovascular;  Laterality: N/A;  . CATARACT EXTRACTION, BILATERAL    . CESAREAN SECTION    . CHOLECYSTECTOMY    . LEFT HEART CATH AND CORONARY ANGIOGRAPHY N/A 09/16/2017   Procedure: LEFT HEART CATH AND CORONARY ANGIOGRAPHY;  Surgeon: Swaziland, Peter M, MD;  Location[facility] INVASIVE CV LAB;  Service: Cardiovascular;  Laterality: N/A;  . TONSILLECTOMY    . WRIST RECONSTRUCTION     Right    OB History   No obstetric history on file.      Home Medications    Prior to Admission medications   Medication Sig Start Date End Date Taking? Authorizing Provider  dextromethorphan-guaiFENesin (MUCINEX DM) 30-600 MG 12hr tablet Take 1 tablet by mouth 2 (two) times daily. 11/16/20  Yes Tiler Brandis C, PA-C  doxycycline (VIBRAMYCIN) 100 MG  capsule Take 1 capsule (100 mg total) by mouth 2 (two) times daily for 7 days. 11/16/20 11/23/20 Yes Matea Stanard C, PA-C  fluticasone (FLONASE) 50 MCG/ACT nasal spray Place 1-2 sprays into both nostrils daily. 11/16/20  Yes Suha Schoenbeck C, PA-C  loratadine (CLARITIN) 10 MG tablet Take 1 tablet (10 mg total) by mouth daily. 11/16/20  Yes Kailin Principato C, PA-C  acetaminophen (TYLENOL) 500 MG tablet Take 1,000 mg by mouth every 8 (eight) hours as needed.    [provider]  ascorbic acid (VITAMIN C) 500 MG tablet Take 500 mg by mouth daily.    [provider]  benzonatate (TESSALON PERLES) 100 MG capsule Take 1 capsule (100 mg total) by mouth 3 (three) times daily as needed. 11/15/20   Terressa Koyanagi, DO  Cholecalciferol (D3) 50 MCG (2000 UT) TABS Take by mouth daily.     [provider]  Cyanocobalamin (B-12) 3000 MCG CAPS Take by mouth.    [provider]  diclofenac sodium (VOLTAREN) 1 % GEL Apply 2 g topically 4 (four) times daily. 09/16/19   Shelva Majestic, MD  ELIQUIS 2.5 MG TABS tablet TAKE 1 TABLET TWICE A DAY 03/26/20   Rollene Rotunda, MD  furosemide (LASIX) 20 MG tablet TAKE 3 TABLETS BY MOUTH EVERY DAY 04/16/20   Shelva Majestic, MD  isosorbide mononitrate (IMDUR) 120 MG 24 hr tablet TAKE 1 TABLET DAILY        **[facility] MFR** 09/25/20   Rollene Rotunda, MD  KLOR-CON M10 10 MEQ tablet TAKE 1 TABLET BY MOUTH EVERY DAY 10/03/20   Shelva Majestic, MD  lansoprazole (PREVACID) 30 MG capsule TAKE 1 CAPSULE TWICE DAILY 03/26/20   Rollene Rotunda, MD  losartan (COZAAR) 50 MG tablet Take 1 tablet (50 mg total) by mouth daily. 04/13/20   Shelva Majestic, MD  nitroGLYCERIN (NITROSTAT) 0.4 MG SL tablet Place 1 tablet (0.4 mg total) under the tongue every 5 (five) minutes as needed for chest pain (if have pain after 3 pills- call 911). 04/13/20   Shelva Majestic, MD  pravastatin (PRAVACHOL) 40 MG tablet TAKE 1 TABLET BY MOUTH EVERY DAY 10/03/20   Shelva Majestic, MD  SYNTHROID 50 MCG tablet TAKE 1 TABLET DAILY BEFORE BREAKFAST. 12/06/19   Shelva Majestic, MD  traMADol (ULTRAM) 50 MG tablet Take 1 tablet (50 mg total) by mouth every 6 (six) hours as needed. 10/03/20   Elvina Sidle, MD  albuterol (PROVENTIL HFA;VENTOLIN HFA) 108 (90 Base) MCG/ACT inhaler TAKE 2 PUFFS BY MOUTH EVERY 6 HOURS AS NEEDED FOR WHEEZE OR SHORTNESS OF BREATH 02/01/18 10/03/20  Shelva Majestic, MD    Family History Family History  Problem Relation Age of Onset  . Colon cancer Mother   . Coronary artery disease Mother   . Heart disease Mother   . Coronary artery disease Father   . Heart disease Father   .  Colon polyps Sister   . Coronary artery disease Sister   . Diabetes type II Son   . Coronary artery disease Brother     Social History Social History   Tobacco Use  . Smoking status: Former Smoker    Packs/day: 1.00    Years: 15.00    Pack years: 15.00    Types: Cigarettes    Quit date: 11/10/1969    Years since quitting: 51.0  . Smokeless tobacco: Never Used  Vaping Use  . Vaping Use: Never used  Substance Use Topics  . Alcohol use: No    Alcohol/week: 0.0 standard drinks  . Drug use: No     Allergies   Codeine and Sulfonamide derivatives   Review of Systems Review of Systems  Constitutional: Negative for activity change, appetite change, chills, fatigue and fever.  HENT: Positive for congestion, rhinorrhea, sinus pressure and sore throat. Negative for ear pain and trouble swallowing.   Eyes: Negative for discharge and redness.  Respiratory: Positive for cough. Negative for chest tightness and shortness of breath.   Cardiovascular: Negative for chest pain.  Gastrointestinal: Negative for abdominal pain, diarrhea, nausea and vomiting.  Musculoskeletal: Negative for myalgias.  Skin: Negative for rash.  Neurological: Negative for dizziness, light-headedness and headaches.     Physical Exam Triage Vital Signs ED Triage Vitals  Enc Vitals  Group     BP 11/16/20 1521 (!) 179/78     Pulse Rate 11/16/20 1521 90     Resp 11/16/20 1521 18     Temp 11/16/20 1521 98 F (36.7 C)     Temp Source 11/16/20 1521 Oral     SpO2 11/16/20 1521 98 %     Weight --      Height --      Head Circumference --      Peak Flow --      Pain Score 11/16/20 1520 0     Pain Loc --      Pain Edu? --      Excl. in GC? --    No data found.  Updated Vital Signs BP (!) 179/78 (BP Location: Right Arm)   Pulse 90   Temp 98 F (36.7 C) (Oral)   Resp 18   SpO2 98%   Visual Acuity Right Eye Distance:   Left Eye Distance:   Bilateral Distance:    Right Eye Near:   Left Eye Near:    Bilateral Near:     Physical Exam Vitals and nursing note reviewed.  Constitutional:      Appearance: She is well-developed and well-nourished.     Comments: No acute distress  HENT:     Head: Normocephalic and atraumatic.     Ears:     Comments: Bilateral ears without tenderness to palpation of external auricle, tragus and mastoid, EAC's without erythema or swelling, TM's with good bony landmarks and cone of light. Non erythematous.     Nose: Nose normal.     Mouth/Throat:     Comments: Oral mucosa pink and moist, no tonsillar enlargement or exudate. Posterior pharynx patent and nonerythematous, no uvula deviation or swelling. Normal phonation. Eyes:     Conjunctiva/sclera: Conjunctivae normal.  Cardiovascular:     Rate and Rhythm: Normal rate.  Pulmonary:     Effort: Pulmonary effort is normal. No respiratory distress.     Comments: Breathing comfortably at rest, CTABL, no wheezing, rales or other adventitious sounds auscultated Abdominal:     General: There is no  distension.  Musculoskeletal:        General: Normal range of motion.     Cervical back: Neck supple.  Skin:    General: Skin is warm and dry.  Neurological:     Mental Status: She is alert and oriented to person, place, and time.  Psychiatric:        Mood and Affect: Mood and affect  normal.      UC Treatments / Results  Labs (all labs ordered are listed, but only abnormal results are displayed) Labs Reviewed  NOVEL CORONAVIRUS, NAA    EKG   Radiology No results found.  Procedures Procedures (including critical care time)  Medications Ordered in UC Medications - No data to display  Initial Impression / Assessment and Plan / UC Course  I have reviewed the triage vital signs and the nursing notes.  Pertinent labs & imaging results that were available during my care of the patient were reviewed by me and considered in my medical decision making (see chart for details).     Covid test pending, 5 days of symptoms, but given patient's age and reported symptoms opting to go ahead and treat and cover with doxycycline, continue Tessalon, may add in Mucinex, Claritin and Flonase for further sinus congestion and inflammation.  Discussed strict return precautions. Patient verbalized understanding and is agreeable with plan.  Final Clinical Impressions(s) / UC Diagnoses   Final diagnoses:  Fatigue, unspecified type  Lower respiratory infection (e.g., bronchitis, pneumonia, pneumonitis, pulmonitis)     Discharge Instructions     Begin doxycycline twice daily for 1 week to cover infection in sinuses and lungs Continue Tessalon/benzonatate every 8 hours for cough Please pair tessalon with Mucinex DM to further help with cough and congestion Flonase and Claritin daily to help with sinus inflammation and nasal congestion/postnasal drainage Rest and fluids Follow-up if not improving or worsening    ED Prescriptions    Medication Sig Dispense Auth. Provider   dextromethorphan-guaiFENesin (MUCINEX DM) 30-600 MG 12hr tablet Take 1 tablet by mouth 2 (two) times daily. 20 tablet Lonette Stevison C, PA-C   doxycycline (VIBRAMYCIN) 100 MG capsule Take 1 capsule (100 mg total) by mouth 2 (two) times daily for 7 days. 14 capsule Kendrix Orman C, PA-C   fluticasone  (FLONASE) 50 MCG/ACT nasal spray Place 1-2 sprays into both nostrils daily. 16 g Keats Kingry C, PA-C   loratadine (CLARITIN) 10 MG tablet Take 1 tablet (10 mg total) by mouth daily. 10 tablet Adia Crammer, [place] C, PA-C     PDMP not reviewed this encounter.   Lew Dawes, [place] 11/16/20 1712

## 2020-11-16 NOTE — ED Triage Notes (Signed)
Patient c/o runny nose and productive cough w/ "yellow" sputum production x 5 days.   Patient denies fever at home.    Patient has taken Tylenol and Tessalon [place] w/ some relief of symptoms.

## 2020-11-16 NOTE — Discharge Instructions (Signed)
Begin doxycycline twice daily for 1 week to cover infection in sinuses and lungs Continue Tessalon/benzonatate every 8 hours for cough Please pair tessalon with Mucinex DM to further help with cough and congestion Flonase and Claritin daily to help with sinus inflammation and nasal congestion/postnasal drainage Rest and fluids Follow-up if not improving or worsening

## 2020-11-16 NOTE — Progress Notes (Signed)
Are we able to do a covid test on patient today?

## 2020-11-19 ENCOUNTER — Telehealth: Payer: Self-pay

## 2020-11-19 LAB — NOVEL CORONAVIRUS, NAA: SARS-CoV-2, NAA: NOT DETECTED

## 2020-11-19 NOTE — Telephone Encounter (Signed)
I am sorry to hear that-how is she feeling now?

## 2020-11-19 NOTE — Telephone Encounter (Signed)
FYI

## 2020-11-19 NOTE — Telephone Encounter (Signed)
Pt wanted Dr. Durene Cal to know she went to the urgent care last week and was treated for pneumonia & bronchitis.

## 2020-11-20 NOTE — Telephone Encounter (Signed)
Unable to get in contact with the patient. VM is full. I will contact later today.

## 2020-11-20 NOTE — Telephone Encounter (Signed)
FYI

## 2020-11-20 NOTE — Telephone Encounter (Signed)
Patient called in returning a call, did ask how patient was doing. Scherry states she is doing a little better now that she has medicine, but still has a bit of a cough.

## 2020-11-20 NOTE — Telephone Encounter (Signed)
Tried reaching out to pt again and mailbox is still full.

## 2020-11-20 NOTE — Telephone Encounter (Signed)
Glad she is improving-please let us know if she fails to continue to improve

## 2021-01-17 ENCOUNTER — Telehealth: Payer: Self-pay

## 2021-01-17 NOTE — Telephone Encounter (Signed)
Pt.'s son Pola Corn called wanting to discuss with Dr. Durene Cal becoming Pt.'s POA. Explained to Pt what a DPR was and that he was not on the list, so Dr. Durene Cal wouldn't be able to discuss any personal information with him. Son said he would then have to do it through the legal system and get an attorney to request info.

## 2021-01-22 ENCOUNTER — Other Ambulatory Visit: Payer: Self-pay | Admitting: Cardiology

## 2021-01-22 ENCOUNTER — Other Ambulatory Visit: Payer: Self-pay | Admitting: Family Medicine

## 2021-01-22 NOTE — Telephone Encounter (Signed)
Medication is on long term back order per pharmacy, please advise.

## 2021-01-22 NOTE — Telephone Encounter (Signed)
Team please stop losartan 100 mg.  Please start her on valsartan 160 mg daily #90 with 3 refills.  Have her monitor her blood pressure at home and if she does not feel she can do this schedule a visit in 4 to 6 weeks with me to recheck blood pressure.  Home blood pressures ideally should be less than 140/90 for her

## 2021-01-24 ENCOUNTER — Other Ambulatory Visit: Payer: Self-pay

## 2021-01-24 MED ORDER — VALSARTAN 160 MG PO TABS: 160.0000 mg | ORAL_TABLET | Freq: Every day | ORAL | 3 refills | Status: DC

## 2021-01-28 ENCOUNTER — Other Ambulatory Visit: Payer: Self-pay | Admitting: Family Medicine

## 2021-02-15 ENCOUNTER — Ambulatory Visit: Payer: Medicare Other

## 2021-03-08 ENCOUNTER — Other Ambulatory Visit: Payer: Self-pay | Admitting: Family Medicine

## 2021-04-16 ENCOUNTER — Ambulatory Visit (INDEPENDENT_AMBULATORY_CARE_PROVIDER_SITE_OTHER): Payer: Medicare Other | Admitting: Family Medicine

## 2021-04-16 ENCOUNTER — Other Ambulatory Visit: Payer: Self-pay

## 2021-04-16 ENCOUNTER — Encounter: Payer: Self-pay | Admitting: Family Medicine

## 2021-04-16 VITALS — BP 132/88 | HR 81 | Temp 98.4°F | Ht 63.0 in | Wt 152.0 lb

## 2021-04-16 DIAGNOSIS — E1122 Type 2 diabetes mellitus with diabetic chronic kidney disease: Secondary | ICD-10-CM

## 2021-04-16 DIAGNOSIS — I4821 Permanent atrial fibrillation: Secondary | ICD-10-CM | POA: Diagnosis not present

## 2021-04-16 DIAGNOSIS — E034 Atrophy of thyroid (acquired): Secondary | ICD-10-CM | POA: Diagnosis not present

## 2021-04-16 DIAGNOSIS — R300 Dysuria: Secondary | ICD-10-CM | POA: Diagnosis not present

## 2021-04-16 DIAGNOSIS — I25119 Atherosclerotic heart disease of native coronary artery with unspecified angina pectoris: Secondary | ICD-10-CM

## 2021-04-16 DIAGNOSIS — I5042 Chronic combined systolic (congestive) and diastolic (congestive) heart failure: Secondary | ICD-10-CM

## 2021-04-16 DIAGNOSIS — Z79899 Other long term (current) drug therapy: Secondary | ICD-10-CM | POA: Diagnosis not present

## 2021-04-16 DIAGNOSIS — N183 Chronic kidney disease, stage 3 unspecified: Secondary | ICD-10-CM

## 2021-04-16 LAB — POC URINALSYSI DIPSTICK (AUTOMATED)
Bilirubin, UA: NEGATIVE
Blood, UA: NEGATIVE
Glucose, UA: NEGATIVE
Ketones, UA: NEGATIVE
Nitrite, UA: NEGATIVE
Protein, UA: POSITIVE — AB
Spec Grav, UA: 1.02 (ref 1.010–1.025)
Urobilinogen, UA: 0.2 E.U./dL
pH, UA: 6 (ref 5.0–8.0)

## 2021-04-16 NOTE — Progress Notes (Signed)
Phone [phone] In person visit   Subjective:   Victoria Gomez is a 87 y.o. year old very pleasant female patient who presents for/with See problem oriented charting Chief Complaint  Patient presents with  . Diabetes   This visit occurred during the SARS-CoV-2 public health emergency.  Safety protocols were in place, including screening questions prior to the visit, additional usage of staff PPE, and extensive cleaning of exam room while observing appropriate contact time as indicated for disinfecting solutions.   Past Medical History-  Patient Active Problem List   Diagnosis Date Noted  . Controlled type 2 diabetes mellitus with renal manifestation (HCC) 09/22/2013    Priority: High  . Permanent atrial fibrillation (HCC) 09/21/2013    Priority: High  . H/O sinus bradycardia 08/10/2012    Priority: High  . Combined systolic and diastolic congestive heart failure (HCC) 11/17/2011    Priority: High  . Coronary artery disease involving native coronary artery of native heart with angina pectoris (HCC) 08/03/2007    Priority: High  . Osteoporosis 06/28/2015    Priority: Medium  . Vitamin B12 deficiency 07/28/2014    Priority: Medium  . CKD (chronic kidney disease), stage III (HCC) 09/22/2013    Priority: Medium  . LBBB (left bundle branch block) 09/22/2013    Priority: Medium  . Interstitial cystitis     Priority: Medium  . Hyperlipidemia associated with type 2 diabetes mellitus (HCC) 03/07/2008    Priority: Medium  . Hypothyroidism 08/03/2007    Priority: Medium  . Hypertension associated with diabetes (HCC) 08/03/2007    Priority: Medium  . Chronic anticoagulation 07/09/2017    Priority: Low  . Left groin hernia 06/25/2017    Priority: Low  . Internal hemorrhoids     Priority: Low  . Orthostatic hypotension 08/10/2012    Priority: Low  . Fatigue 04/20/2012    Priority: Low  . ARTHRITIS 12/30/2010    Priority: Low  . Left lumbar radiculopathy 12/11/2010     Priority: Low  . Esophageal reflux 10/18/2007    Priority: Low  . History of Barrett's esophagus 08/03/2007    Priority: Low  . Educated about COVID-19 virus infection 05/03/2020  . Stenosis of carotid artery 05/03/2020  . Chronic atrial fibrillation (HCC) 05/03/2020    Medications- reviewed and updated Current Outpatient Medications  Medication Sig Dispense Refill  . acetaminophen (TYLENOL) 500 MG tablet Take 1,000 mg by mouth every 8 (eight) hours as needed.    [name] ascorbic acid (VITAMIN C) 500 MG tablet Take 500 mg by mouth daily.    . Cholecalciferol (D3) 50 MCG (2000 UT) TABS Take by mouth daily.     . Cyanocobalamin (B-12) 3000 MCG CAPS Take by mouth.    . ELIQUIS 2.5 MG TABS tablet TAKE 1 TABLET TWICE A DAY 180 tablet 3  . fluticasone (FLONASE) 50 MCG/ACT nasal spray Place 1-2 sprays into both nostrils daily. 16 g 0  . furosemide (LASIX) 20 MG tablet TAKE 3 TABLETS BY MOUTH EVERY DAY 270 tablet 1  . isosorbide mononitrate (IMDUR) 120 MG 24 hr tablet TAKE 1 TABLET DAILY        **KREMERS URBAN MFR** 90 tablet 3  . KLOR-CON M10 10 MEQ tablet TAKE 1 TABLET BY MOUTH EVERY DAY 90 tablet 2  . lansoprazole (PREVACID) 30 MG capsule TAKE 1 CAPSULE TWICE DAILY 180 capsule 3  . loratadine (CLARITIN) 10 MG tablet Take 1 tablet (10 mg total) by mouth daily. 10 tablet 0  . nitroGLYCERIN (  NITROSTAT) 0.4 MG SL tablet Place 1 tablet (0.4 mg total) under the tongue every 5 (five) minutes as needed for chest pain (if have pain after 3 pills- call 911). 50 tablet 3  . ONETOUCH VERIO test strip USE TO TEST BLOOD SUGARS DAILY. DX: E11.9 100 strip 3  . pravastatin (PRAVACHOL) 40 MG tablet TAKE 1 TABLET BY MOUTH EVERY DAY 90 tablet 1  . SYNTHROID 50 MCG tablet TAKE 1 TABLET DAILY BEFORE BREAKFAST. 90 tablet 3  . valsartan (DIOVAN) 160 MG tablet Take 1 tablet (160 mg total) by mouth daily. 90 tablet 3  . benzonatate (TESSALON PERLES) 100 MG capsule Take 1 capsule (100 mg total) by mouth 3 (three) times daily  as needed. (Patient not taking: Reported on 04/16/2021) 20 capsule 0  . dextromethorphan-guaiFENesin (MUCINEX DM) 30-600 MG 12hr tablet Take 1 tablet by mouth 2 (two) times daily. (Patient not taking: Reported on 04/16/2021) 20 tablet 0  . diclofenac sodium (VOLTAREN) 1 % GEL Apply 2 g topically 4 (four) times daily. (Patient not taking: Reported on 04/16/2021) 100 g 3  . traMADol (ULTRAM) 50 MG tablet Take 1 tablet (50 mg total) by mouth every 6 (six) hours as needed. (Patient not taking: Reported on 04/16/2021) 15 tablet 0   No current facility-administered medications for this visit.     Objective:  BP 132/88   Pulse 81   Temp 98.4 F (36.9 C) (Temporal)   Ht 5\' 3"  (1.6 m)   Wt 152 lb (68.9 kg)   SpO2 96%   BMI 26.93 kg/m  Gen: NAD, resting comfortably CV: RRR no murmurs rubs or gallops irregularly regular Lungs: CTAB no crackles, wheeze, rhonchi Abdomen: Mild to moderate tenderness in lower left suprapubic area to palpitation with mild and right lower quadrant and into right upper quadrant Ext: trace edema Skin: warm, dry Neuro: grossly normal, moves all extremities      Assessment and Plan   #Permanent atrial fibrillation S: Compliant with Eliquis 2.5 mg twice a day. Metoprolol 25mg  twice a day for rate control.  A/P: appropriately anticoagulated and rate controlled -continue current medicaion  #Diabetes mellitus S: Has been diet controlled. Lab Results  Component Value Date   HGBA1C 6.2 (H) 10/16/2020   HGBA1C 6.0 (H) 04/13/2020   HGBA1C 6.0 (H) 09/16/2019  A/P: hopefully stable- update A1c today. Continue current meds   #Combined systolic/diastolic congestive heart failure. S: Compliant with Lasix 60 mg 3 daily. She is if up 3 lbs in 6 months but states has actually lost a few pounds recently.  No increased anemia A/P:  appears largely stable/euvolemic- continue current medication  #CAD with angina status post coronary angioplasty in 2001 and 2003/hyperlipidemia S:  Compliant with pravastatin 40 mg. Has been chest pain-free on Imdur. Has had to use nitroglycerin for some time but it has been stable- at least once or twice every 6 months A/P: CAD with stable angina-continue current medication. -Not on aspirin due to being on Eliquis -He is compliant with pravastatin and LDL has been at goal  #Hypertension/CKD stage III S: Compliant with Imdur 120 mg, metoprolol 25 mg twice a day, Lasix 60 mg,valsartan 160 mg daily. GFR has been stable in the 35-50 range. Has had orthostatic intolerance in the past. Home readings: none BP Readings from Last 3 Encounters:  04/16/21 132/88  11/16/20 (!) 179/78  10/26/20 (!) 144/74  A/P: Hypertension- stable. Continue current meds.  Improved control from emergency room trip in January and cardiology visit in December.  CKD- hopefully stable. continue current medications. Check CMP today  #lower abdominal pain-  S: Experienced this 2-3 weeks ago and has been having issues with her bladder- frequent urination and pain when she urinates. She is not constipated- she is having back pain that is consistent. No blood in stool or urine. She is having normal bowel movements- twice a day. She no longer has an appendix.  Reports mild to moderate suprapubic abdominal pain radiating in the right lower quadrant and then into the right upper quadrant.  She has had an appendectomy and cholecystectomy per her report today.  She is worried about colon cancer due to family history.  No unintentional weight loss A/P: Lower abdominal pain of unclear cause radiating into left lower quadrant left upper quadrant but has had appendix and gallbladder removed- ultrasound would likely not be beneficial.  We will start with blood work as well as urine.  If this is normal consider CT scan with family history- I do not think she would be a great candidate to start with colonoscopy-would be more interested in large burden of cancer in her age. -She knows if she  has new or worsening or persistent symptoms or is not here for specific follow-up plan for CT to let us know  #Osteoporosis S: Left femoral neck with T score of -2.8 in 2017. Had recommended Fosamax in 2017. She never took this-she feels like she had elevated blood pressure in the past when she was on this so opted not to take-also probably not the best idea with her Barrett's esophagus A/P:Poor control- counseled on reclast- patient refused today  #Hypothyroidism S: Compliant with levothyroxine 50 mcg A/P: hopefully stable.  Update TSH with labs today  % History of Barrett's esophagus on EGD 2016. In 2015 we rediscussed and she opted not to pursue further EGD given her age. She remains on Prevacid 30 mg twice a day -We will plan to intermittently check B12- check today - would like to reduce dose of medications but patient gets recurrence of symptoms if she misses a single dose.  # in the past back pain-she is considering return to orthopedics. she was planned for b/l L5 selective nereve root block. she did not complete this she reports in 2021 I do not believe- she is not 100% sure what she has had done  #Hernia LLQ- does not want to pursue surgery at present but will let me know if needed- would only want to do if could do spinal anesthesia. Rough experience with general anesthesia- states was awake but couldn't move her body- stable wants to monitor without surgery. - Patient states no recent worsening-continue to monitor  Recommended follow [id number] in about 6 months (around 10/16/2021) for follow up- or sooner if needed. Future Appointments  Date Time Provider Department Center  05/01/2021  2:40 PM Rollene Rotunda, MD [facility]   Lab/Order associations:   ICD-10-CM   1. Dysuria  R30.0 POCT Urinalysis Dipstick (Automated)    Urine Culture  2. Controlled type 2 diabetes mellitus with stage 3 chronic kidney disease, without long-term current use of insulin (HCC)  E11.22 CBC  with Differential/Platelet   N18.30 Comprehensive metabolic panel    Lipid panel    Hemoglobin A1c  3. Hypothyroidism due to acquired atrophy of thyroid  E03.4 TSH  4. High risk medication use  Z79.899 Vitamin B12  5. Chronic combined systolic and diastolic congestive heart failure (HCC) Chronic I50.42   6. Permanent atrial fibrillation (HCC) Chronic I48.21  7. Coronary artery disease involving native coronary artery of native heart with angina pectoris (HCC) Chronic I25.119     No orders of the defined types were placed in this encounter.  I,Harris Phan,acting as a Neurosurgeonscribe for Tana ConchStephen Prem Coykendall, MD.,have documented all relevant documentation on the behalf of Tana ConchStephen Keeton Kassebaum, MD,as directed by  Tana ConchStephen Santos Sollenberger, MD while in the presence of Tana ConchStephen Darely Becknell, MD.   I, Tana ConchStephen Mariko Nowakowski, MD, have reviewed all documentation for this visit. The documentation on 04/16/21 for the exam, diagnosis, procedures, and orders are all accurate and complete.   Return precautions advised.  Tana ConchStephen Katsumi Wisler, MD

## 2021-04-16 NOTE — Addendum Note (Signed)
Addended by: Lorn Junes on: 04/16/2021 03:48 PM   Modules accepted: Orders

## 2021-04-16 NOTE — Patient Instructions (Addendum)
Health Maintenance Due  Topic Date Due  . Prevnar 20-  Eligible we are out of this vaccine in the office at this time.  Never done  . Zoster Vaccines- Shingrix (1 of 2) Check with your local pharmacy.  Never done  . OPHTHALMOLOGY EXAM Sign release form at checkout. 03/15/2021   Please stop by lab before you go If you have mychart- we will send your results within 3 business days of [place] receiving them.  If you do not have mychart- we will call you about results within 5 business days of [place] receiving them.  *please also note that you will see labs on mychart as soon as they post. I will later go in and write notes on them- will say "notes from Dr. Durene Cal"  No changes today unless labs lead [place] to make changes  Recommended follow up: Return in about 6 months (around 10/16/2021) for follow up- or sooner if needed.

## 2021-04-17 LAB — CBC WITH DIFFERENTIAL/PLATELET
Basophils Absolute: 0 10*3/uL (ref 0.0–0.1)
Basophils Relative: 0.7 % (ref 0.0–3.0)
Eosinophils Absolute: 0 10*3/uL (ref 0.0–0.7)
Eosinophils Relative: 0.7 % (ref 0.0–5.0)
HCT: 37.2 % (ref 36.0–46.0)
Hemoglobin: 12.4 g/dL (ref 12.0–15.0)
Lymphocytes Relative: 28.9 % (ref 12.0–46.0)
Lymphs Abs: 1.6 10*3/uL (ref 0.7–4.0)
MCHC: 33.3 g/dL (ref 30.0–36.0)
MCV: 89.6 fl (ref 78.0–100.0)
Monocytes Absolute: 0.3 10*3/uL (ref 0.1–1.0)
Monocytes Relative: 6.2 % (ref 3.0–12.0)
Neutro Abs: 3.6 10*3/uL (ref 1.4–7.7)
Neutrophils Relative %: 63.5 % (ref 43.0–77.0)
Platelets: 171 10*3/uL (ref 150.0–400.0)
RBC: 4.15 Mil/uL (ref 3.87–5.11)
RDW: 13.6 % (ref 11.5–15.5)
WBC: 5.6 10*3/uL (ref 4.0–10.5)

## 2021-04-17 LAB — COMPREHENSIVE METABOLIC PANEL
ALT: 9 U/L (ref 0–35)
AST: 17 U/L (ref 0–37)
Albumin: 4.2 g/dL (ref 3.5–5.2)
Alkaline Phosphatase: 53 U/L (ref 39–117)
BUN: 24 mg/dL — ABNORMAL HIGH (ref 6–23)
CO2: 29 mEq/L (ref 19–32)
Calcium: 9.5 mg/dL (ref 8.4–10.5)
Chloride: 106 mEq/L (ref 96–112)
Creatinine, Ser: 1.04 mg/dL (ref 0.40–1.20)
GFR: 46.05 mL/min — ABNORMAL LOW (ref >60.00)
Glucose, Bld: 90 mg/dL (ref 70–99)
Potassium: 4 mEq/L (ref 3.5–5.1)
Sodium: 144 mEq/L (ref 135–145)
Total Bilirubin: 0.4 mg/dL (ref 0.2–1.2)
Total Protein: 7.4 g/dL (ref 6.0–8.3)

## 2021-04-17 LAB — LIPID PANEL
Cholesterol: 130 mg/dL (ref 0–200)
HDL: 49 mg/dL (ref >39.00)
LDL Cholesterol: 61 mg/dL (ref 0–99)
NonHDL: 80.94
Total CHOL/HDL Ratio: 3
Triglycerides: 98 mg/dL (ref 0.0–149.0)
VLDL: 19.6 mg/dL (ref 0.0–40.0)

## 2021-04-17 LAB — URINE CULTURE
MICRO NUMBER:: 11978384
Result:: NO GROWTH
SPECIMEN QUALITY:: ADEQUATE

## 2021-04-17 LAB — VITAMIN B12: Vitamin B-12: >1550 pg/mL — ABNORMAL HIGH (ref 211–911)

## 2021-04-17 LAB — TSH: TSH: 0.83 u[IU]/mL (ref 0.35–4.50)

## 2021-04-17 LAB — HEMOGLOBIN A1C: Hgb A1c MFr Bld: 6.4 % (ref 4.6–6.5)

## 2021-04-19 ENCOUNTER — Other Ambulatory Visit: Payer: Self-pay | Admitting: Family Medicine

## 2021-05-01 ENCOUNTER — Ambulatory Visit: Payer: Medicare Other | Admitting: Cardiology

## 2021-05-03 ENCOUNTER — Other Ambulatory Visit: Payer: Self-pay | Admitting: Cardiology

## 2021-05-03 ENCOUNTER — Other Ambulatory Visit: Payer: Self-pay | Admitting: Family Medicine

## 2021-05-03 DIAGNOSIS — K2100 Gastro-esophageal reflux disease with esophagitis, without bleeding: Secondary | ICD-10-CM

## 2021-05-06 NOTE — Telephone Encounter (Signed)
Pt qualifies for 5mg  eliquis when she is requesting refill at the lower dose of 2.5mg  will route to pharmd pool. 71f, 68.9kg, scr 1.04 04/16/21, lovw/hochrein 10/26/20

## 2021-05-06 NOTE — Telephone Encounter (Signed)
Pt has been on incorrect dose for > 5 years. September 24, 2017  Victoria Gomez, Victoria Gomez, [facility]     12:00 PM Note Dose of 2.5mg  BID since Nov/2014 per MDs and pharmacist assessment.   Will continue current therapy without changes      09/27/13 note office visit: Assessment / Plan: 1. Atrial fib - on Eliquis and managed with rate control - I have talked with the pharmacist here today - with her CKD, age and being on CCB therapy - it is felt to be on the lower dose of Eliquis - will cut her back to 2.5 mg BID.  Pt is not on CCB and her SCr has been consistently well below 1.5. She does qualify for Eliquis 5mg  BID dosing. Will forward to NL team where pt is followed to finalize dosing rec to make sure I'm not missing anything since pt has been on 2.5mg  dosing for so long.

## 2021-05-07 NOTE — Telephone Encounter (Signed)
Reviewed with Dr. Antoine Poche.  Patient has not had any stroke or bleed in the past.  Based on this as well as her advanced age, he would prefer to leave her at 2.5 mg dose.

## 2021-05-10 ENCOUNTER — Telehealth: Payer: Self-pay | Admitting: Cardiology

## 2021-05-10 MED ORDER — ISOSORBIDE MONONITRATE ER 120 MG PO TB24
120.0000 mg | ORAL_TABLET | Freq: Every day | ORAL | 1 refills | Status: DC
Start: 2021-05-10 — End: 2021-09-16

## 2021-05-10 NOTE — Telephone Encounter (Signed)
*  STAT* If patient is at the pharmacy, call can be transferred to refill team.   1. Which medications need to be refilled? (please list name of each medication and dose if known)  isosorbide mononitrate (IMDUR) 120 MG 24 hr tablet  2. Which pharmacy/location (including street and city if local pharmacy) is medication to be sent to? [id number] - [place], [place] - [address]  3. Do they need a 30 day or 90 day supply? 90 with refills

## 2021-05-13 ENCOUNTER — Other Ambulatory Visit: Payer: Self-pay | Admitting: Family Medicine

## 2021-05-16 ENCOUNTER — Telehealth: Payer: Self-pay

## 2021-05-16 ENCOUNTER — Ambulatory Visit
Admission: EM | Admit: 2021-05-16 | Discharge: 2021-05-16 | Disposition: A | Discharge status: Home / Self Care | Payer: Medicare Other

## 2021-05-16 ENCOUNTER — Other Ambulatory Visit: Payer: Self-pay

## 2021-05-16 DIAGNOSIS — R1030 Lower abdominal pain, unspecified: Secondary | ICD-10-CM

## 2021-05-16 NOTE — ED Triage Notes (Signed)
Patient stated her doctor sent her in for "a scan with dye," to which I informed her that we are unable to provide that here. Notified provider, vitals stable, patient in no obvious distress at this time. Discharging to ED

## 2021-05-16 NOTE — Telephone Encounter (Signed)
Patient's son called stating that Victoria Gomez told him that Dr Durene Cal told her that she needs a scan done. Her son then took her to the urgent care to get the scan done . However, it looks like there is no order for a scan to be done. So the urgent care took her vitals and told her to go to the ED if there is a scan. I looked in her chart and there are still no orders for a scan. Her son would like to double check with Dr Durene Cal that there is no scan that needs to be done

## 2021-05-17 NOTE — Telephone Encounter (Signed)
I am not sure why patient went to urgent care for this-there was no instructions about going to urgent care for a scan.  As per last note we were considering CT-I did go ahead and order this as it sounds like she is having persistent symptoms.  I am hopeful this will get approved-please discuss with referral coordinator and try to get this set up within the next week if possible-if she has new or worsening symptoms should proceed to emergency room

## 2021-05-17 NOTE — Telephone Encounter (Signed)
From last OV note: #lower abdominal pain- S: Experienced this 2-3 weeks ago and has been having issues with her bladder- frequent urination and pain when she urinates. She is not constipated- she is having back pain that is consistent. No blood in stool or urine. She is having normal bowel movements- twice a day. She no longer has an appendix.   Reports mild to moderate suprapubic abdominal pain radiating in the right lower quadrant and then into the right upper quadrant.  She has had an appendectomy and cholecystectomy per her report today.  She is worried about colon cancer due to family history.  No unintentional weight loss A/P: Lower abdominal pain of unclear cause radiating into left lower quadrant left upper quadrant but has had appendix and gallbladder removed- ultrasound would likely not be beneficial.  We will start with blood work as well as urine.  If this is normal consider CT scan with family history- I do not think she would be a great candidate to start with colonoscopy-would be more interested in large burden of cancer in her age. -She knows if she has new or worsening or persistent symptoms or is not here for specific follow-up plan for CT to let us know   No CT has been ordered though**

## 2021-05-17 NOTE — Telephone Encounter (Signed)
Called and lm on pt vm tcb. 

## 2021-05-17 NOTE — Telephone Encounter (Signed)
Patient is calling in wanting an update, advised of message below. Angeline Slim I will route this message to the referral coordinator to see if we can get a scan scheduled.

## 2021-05-21 ENCOUNTER — Telehealth: Payer: Self-pay

## 2021-05-21 NOTE — Telephone Encounter (Signed)
Received a call from patient she stated she received a letter from [employer] order saying they are unable to get Isosorbide.Stated she just received a [facility] [address].Stated she has plenty.Advised to keep upcoming appointment with Dr.Hochrein.

## 2021-05-28 ENCOUNTER — Inpatient Hospital Stay: Admission: RE | Admit: 2021-05-28 | Payer: Medicare Other | Source: Ambulatory Visit

## 2021-06-20 ENCOUNTER — Telehealth: Payer: Self-pay | Admitting: Pharmacist

## 2021-06-20 NOTE — Chronic Care Management (AMB) (Addendum)
Chronic Care Management Pharmacy Assistant   Name: Victoria Gomez  MRN: [id number] DOB: 04-Sep-1925  Reason for Encounter: Hypertension Adherence Call    Recent office visits:  04/16/2021 OV (PCP) Shelva Majestic, MD; no medication changes indicated.  Recent consult visits:  None  Hospital visits:  None in previous 6 months  Medications: Outpatient Encounter Medications as of 06/20/2021  Medication Sig   acetaminophen (TYLENOL) 500 MG tablet Take 1,000 mg by mouth every 8 (eight) hours as needed.   apixaban (ELIQUIS) 2.5 MG TABS tablet TAKE 1 TABLET TWICE A DAY   ascorbic acid (VITAMIN C) 500 MG tablet Take 500 mg by mouth daily.   benzonatate (TESSALON PERLES) 100 MG capsule Take 1 capsule (100 mg total) by mouth 3 (three) times daily as needed. (Patient not taking: Reported on 04/16/2021)   Cholecalciferol (D3) 50 MCG (2000 UT) TABS Take by mouth daily.    Cyanocobalamin (B-12) 3000 MCG CAPS Take by mouth.   dextromethorphan-guaiFENesin (MUCINEX DM) 30-600 MG 12hr tablet Take 1 tablet by mouth 2 (two) times daily. (Patient not taking: Reported on 04/16/2021)   diclofenac sodium (VOLTAREN) 1 % GEL Apply 2 g topically 4 (four) times daily. (Patient not taking: Reported on 04/16/2021)   fluticasone (FLONASE) 50 MCG/ACT nasal spray Place 1-2 sprays into both nostrils daily.   furosemide (LASIX) 20 MG tablet TAKE 3 TABLETS BY MOUTH EVERY DAY   isosorbide mononitrate (IMDUR) 120 MG 24 hr tablet Take 1 tablet (120 mg total) by mouth daily. NEED OV.   KLOR-CON M10 10 MEQ tablet TAKE 1 TABLET BY MOUTH EVERY DAY   lansoprazole (PREVACID) 30 MG capsule TAKE 1 CAPSULE TWICE DAILY   loratadine (CLARITIN) 10 MG tablet Take 1 tablet (10 mg total) by mouth daily.   nitroGLYCERIN (NITROSTAT) 0.4 MG SL tablet 1 TAB UNDER THE TONGUE EVERY 5 MINS AS NEEDED FOR CHEST PAIN (IF HAVE PAIN AFTER 3 PILLS- CALL 911).   ONETOUCH VERIO test strip USE TO TEST BLOOD SUGARS DAILY. DX: E11.9   pravastatin  (PRAVACHOL) 40 MG tablet TAKE 1 TABLET BY MOUTH EVERY DAY   SYNTHROID 50 MCG tablet TAKE 1 TABLET DAILY BEFORE BREAKFAST.   traMADol (ULTRAM) 50 MG tablet Take 1 tablet (50 mg total) by mouth every 6 (six) hours as needed. (Patient not taking: Reported on 04/16/2021)   valsartan (DIOVAN) 160 MG tablet Take 1 tablet (160 mg total) by mouth daily.   [DISCONTINUED] albuterol (PROVENTIL HFA;VENTOLIN HFA) 108 (90 Base) MCG/ACT inhaler TAKE 2 PUFFS BY MOUTH EVERY 6 HOURS AS NEEDED FOR WHEEZE OR SHORTNESS OF BREATH   No facility-administered encounter medications on file as of 06/20/2021.    Reviewed chart prior to disease state call. Spoke with patient regarding BP  Recent Office Vitals: BP Readings from Last 3 Encounters:  05/16/21 124/76  04/16/21 132/88  11/16/20 (!) 179/78   Pulse Readings from Last 3 Encounters:  05/16/21 86  04/16/21 81  11/16/20 90    Wt Readings from Last 3 Encounters:  04/16/21 152 lb (68.9 kg)  10/26/20 151 lb 6.4 oz (68.7 kg)  10/16/20 149 lb (67.6 kg)     Kidney Function Lab Results  Component Value Date/Time   CREATININE 1.04 04/16/2021 03:37 PM   CREATININE 0.96 (H) 10/16/2020 03:49 PM   CREATININE 1.25 (H) 04/13/2020 04:27 PM   GFR 46.05 (L) 04/16/2021 03:37 PM   GFRNONAA 51 (L) 10/16/2020 03:49 PM   GFRAA 59 (L) 10/16/2020 03:49 PM  BMP Latest Ref Rng & Units 04/16/2021 10/16/2020 04/13/2020  Glucose 70 - 99 mg/dL 90 91 95  BUN 6 - 23 mg/dL 76(L) 22 24  Creatinine 0.40 - 1.20 mg/dL 4.65 0.35(W) 6.56(C)  BUN/Creat Ratio 6 - 22 (calc) - 23(H) 19  Sodium 135 - 145 mEq/L 144 142 144  Potassium 3.5 - 5.1 mEq/L 4.0 4.2 4.5  Chloride 96 - 112 mEq/L 106 105 108  CO2 19 - 32 mEq/L 29 29 27   Calcium 8.4 - 10.5 mg/dL 9.5 9.7 9.4    Current antihypertensive regimen:  Isosorbide mononitrate 120 mg daily  How often are you checking your Blood Pressure? 1-2x week  Current home BP readings: 120's/70's  What recent interventions/DTPs have been made by any  provider to improve Blood Pressure control since last CPP Visit: None noted  Any recent hospitalizations or ED visits since last visit with CPP? No  What diet changes have been made to improve Blood Pressure Control?  Patient states she tries her best to eat healthy. What exercise is being done to improve your Blood Pressure Control?  Patient states she walks around the house some.  Adherence Review: Is the patient currently on ACE/ARB medication? Yes Does the patient have >5 day gap between last estimated fill dates? No    Future Appointments  Date Time Provider Department Center  06/25/2021  3:00 PM [facility] 1 [facility] [address] [facility]  07/05/2021  3:20 PM 07/07/2021, MD [facility]  10/18/2021  1:40 PM 14/07/2021, MD [facility]    Star Rating Drugs: Pravastatin 40 mg last filled 03/24/2021 90 DS Valsartan 160 mg last filled 05/04/2021 90 DS  April D Calhoun, [facility] Clinical Pharmacist Assistant [phone]   10 minutes spent in review, coordination, and documentation.  Reviewed by: [phone], PharmD Clinical Pharmacist [phone]

## 2021-06-25 ENCOUNTER — Other Ambulatory Visit: Payer: Self-pay

## 2021-06-25 ENCOUNTER — Ambulatory Visit (INDEPENDENT_AMBULATORY_CARE_PROVIDER_SITE_OTHER): Payer: Medicare Other

## 2021-06-25 ENCOUNTER — Inpatient Hospital Stay: Admission: RE | Admit: 2021-06-25 | Payer: Medicare Other | Source: Ambulatory Visit

## 2021-06-25 ENCOUNTER — Telehealth (INDEPENDENT_AMBULATORY_CARE_PROVIDER_SITE_OTHER): Payer: Medicare Other | Admitting: Nurse Practitioner

## 2021-06-25 ENCOUNTER — Telehealth: Payer: Medicare Other | Admitting: Physician Assistant

## 2021-06-25 ENCOUNTER — Encounter: Payer: Self-pay | Admitting: Nurse Practitioner

## 2021-06-25 ENCOUNTER — Telehealth: Payer: Self-pay

## 2021-06-25 ENCOUNTER — Telehealth: Payer: Medicare Other | Admitting: Family Medicine

## 2021-06-25 VITALS — Temp 99.1°F | Ht 63.0 in | Wt 147.2 lb

## 2021-06-25 DIAGNOSIS — R059 Cough, unspecified: Secondary | ICD-10-CM

## 2021-06-25 DIAGNOSIS — I25119 Atherosclerotic heart disease of native coronary artery with unspecified angina pectoris: Secondary | ICD-10-CM

## 2021-06-25 DIAGNOSIS — J40 Bronchitis, not specified as acute or chronic: Secondary | ICD-10-CM

## 2021-06-25 DIAGNOSIS — J189 Pneumonia, unspecified organism: Secondary | ICD-10-CM

## 2021-06-25 MED ORDER — BENZONATATE 100 MG PO CAPS: 100.0000 mg | ORAL_CAPSULE | Freq: Three times a day (TID) | ORAL | 0 refills | Status: DC | PRN

## 2021-06-25 NOTE — Telephone Encounter (Signed)
Pt's son Bobbie Stack called regarding his mother's health and conditions.He wouldn't tell me what his concern was but he would like to speak to a nurse about [facility]. He would like a call back at [phone]. Please Advise

## 2021-06-25 NOTE — Patient Instructions (Signed)
Go to [facility] for CXR. Use benzonate for cough if needed

## 2021-06-25 NOTE — Addendum Note (Signed)
Addended by: Renaldo Reel S on: 06/25/2021 03:50 PM   Modules accepted: Orders

## 2021-06-25 NOTE — Progress Notes (Signed)
Virtual Visit via televisit Note  I connected withNAME@ on 06/25/21 at  2:00 PM EDT by a video enabled telemedicine application and verified that I am speaking with the correct person using two identifiers. Interactive audio and video telecommunications were attempted between this provider and patient, however failed because patient did not have access to video capability.  We continued and completed visit with audio only.    Location: Patient:Home Provider: Office Participants: patient, son and provider  I discussed the limitations of evaluation and management by telemedicine and the availability of in person appointments. I also discussed with the patient that there may be a patient responsible charge related to this service. The patient expressed understanding and agreed to proceed.  RU:EAVWU x 1.5week  History of Present Illness: Possible COVID exposure 1.5week ago She is unable to complete home covid test. Productive Cough x1.5week, had fever x 1day but no fever in last 72hrs OTC medication corcidin, some relief No fever. No SOB, no PND, no chest pain, no night sweats, no palpitations Cough This is a new problem. The current episode started 1 to 4 weeks ago. The problem has been unchanged. The problem occurs constantly. The cough is Productive of sputum. Pertinent negatives include no chest pain, chills, ear congestion, ear pain, fever, headaches, heartburn, hemoptysis, myalgias, nasal congestion, postnasal drip, rash, rhinorrhea, sore throat, shortness of breath, sweats, weight loss or wheezing. The symptoms are aggravated by lying down. She has tried OTC cough suppressant for the symptoms. The treatment provided mild relief. There is no history of asthma, bronchiectasis, bronchitis, COPD, emphysema, environmental allergies or pneumonia.   Wt Readings from Last 3 Encounters:  06/25/21 147 lb 3.2 oz (66.8 kg)  04/16/21 152 lb (68.9 kg)  10/26/20 151 lb 6.4 oz (68.7 kg)     Observations/Objective: Unable to provide any BP or pulse or O2 saturation Alert and oriented x 4, normal speech. Limited exam due to televisit.  Assessment and Plan: Zamyiah was seen today for acute visit.  Diagnoses and all orders for this visit:  Cough -     benzonatate (TESSALON PERLES) 100 MG capsule; Take 1 capsule (100 mg total) by mouth 3 (three) times daily as needed. -     DG Chest 2 View  Follow Up Instructions: Go to [facility] for CXR. Use benzonate for cough if needed   I discussed the assessment and treatment plan with the patient. The patient was provided an opportunity to ask questions and all were answered. The patient agreed with the plan and demonstrated an understanding of the instructions.   The patient was advised to call back or seek an in-person evaluation if the symptoms worsen or if the condition fails to improve as anticipated.  I provided of non-face-to-face time during this encounter.  Alysia Penna, NP

## 2021-06-26 MED ORDER — LEVOFLOXACIN 500 MG PO TABS: 500.0000 mg | ORAL_TABLET | Freq: Every day | ORAL | 0 refills | Status: AC

## 2021-06-26 MED ORDER — ALBUTEROL SULFATE HFA 108 (90 BASE) MCG/ACT IN AERS: 2.0000 | INHALATION_SPRAY | Freq: Four times a day (QID) | RESPIRATORY_TRACT | 2 refills | Status: AC | PRN

## 2021-06-26 MED ORDER — PREDNISONE 10 MG (21) PO TBPK
ORAL_TABLET | ORAL | 0 refills | Status: DC
Start: 2021-06-26 — End: 2021-07-09

## 2021-06-26 NOTE — Progress Notes (Unsigned)
See result note.  

## 2021-06-26 NOTE — Telephone Encounter (Signed)
Pt son would like someone to call and schedule pt for a follow up visit with Dr. Durene Cal to discuss the below and to discuss in home health care.

## 2021-06-26 NOTE — Telephone Encounter (Signed)
Called and spoke with pt son who lives in [place]. He states pt has been complaining about her right side and has been seen at a Urgent Care regarding this. Pt has also had a dry cough for a couple of weeks and has been exposed to COVID multiple times and they can not get her to get tested for COVID and he is concerned regarding the cough and would like for [place] to reinforce the importance of getting COVID.

## 2021-06-27 ENCOUNTER — Other Ambulatory Visit: Payer: Self-pay | Admitting: Family Medicine

## 2021-06-27 NOTE — Telephone Encounter (Signed)
Talked to son and Victoria Gomez is scheduled but will call back after speaking to [facility]

## 2021-07-05 ENCOUNTER — Ambulatory Visit: Payer: Medicare Other | Admitting: Cardiology

## 2021-07-09 ENCOUNTER — Other Ambulatory Visit: Payer: Self-pay

## 2021-07-09 ENCOUNTER — Encounter: Payer: Self-pay | Admitting: Family Medicine

## 2021-07-09 ENCOUNTER — Ambulatory Visit (INDEPENDENT_AMBULATORY_CARE_PROVIDER_SITE_OTHER): Payer: Medicare Other | Admitting: Family Medicine

## 2021-07-09 VITALS — BP 105/63 | HR 90 | Temp 98.6°F | Ht 63.0 in | Wt 144.2 lb

## 2021-07-09 DIAGNOSIS — I25119 Atherosclerotic heart disease of native coronary artery with unspecified angina pectoris: Secondary | ICD-10-CM

## 2021-07-09 DIAGNOSIS — I5040 Unspecified combined systolic (congestive) and diastolic (congestive) heart failure: Secondary | ICD-10-CM

## 2021-07-09 DIAGNOSIS — N183 Chronic kidney disease, stage 3 unspecified: Secondary | ICD-10-CM

## 2021-07-09 DIAGNOSIS — I4821 Permanent atrial fibrillation: Secondary | ICD-10-CM | POA: Diagnosis not present

## 2021-07-09 DIAGNOSIS — E039 Hypothyroidism, unspecified: Secondary | ICD-10-CM

## 2021-07-09 DIAGNOSIS — R911 Solitary pulmonary nodule: Secondary | ICD-10-CM | POA: Diagnosis not present

## 2021-07-09 DIAGNOSIS — R1031 Right lower quadrant pain: Secondary | ICD-10-CM

## 2021-07-09 DIAGNOSIS — G8929 Other chronic pain: Secondary | ICD-10-CM

## 2021-07-09 DIAGNOSIS — J849 Interstitial pulmonary disease, unspecified: Secondary | ICD-10-CM

## 2021-07-09 DIAGNOSIS — E785 Hyperlipidemia, unspecified: Secondary | ICD-10-CM | POA: Diagnosis not present

## 2021-07-09 DIAGNOSIS — E1159 Type 2 diabetes mellitus with other circulatory complications: Secondary | ICD-10-CM | POA: Diagnosis not present

## 2021-07-09 DIAGNOSIS — I152 Hypertension secondary to endocrine disorders: Secondary | ICD-10-CM | POA: Diagnosis not present

## 2021-07-09 DIAGNOSIS — E1169 Type 2 diabetes mellitus with other specified complication: Secondary | ICD-10-CM | POA: Diagnosis not present

## 2021-07-09 NOTE — Progress Notes (Signed)
Phone [phone] In person visit   Subjective:   Victoria Gomez is a 87 y.o. year old very pleasant female patient who presents for/with See problem oriented charting No chief complaint on file.   This visit occurred during the SARS-CoV-2 public health emergency.  Safety protocols were in place, including screening questions prior to the visit, additional usage of staff PPE, and extensive cleaning of exam room while observing appropriate contact time as indicated for disinfecting solutions.   Past Medical History-  Patient Active Problem List   Diagnosis Date Noted   Controlled type 2 diabetes mellitus with renal manifestation (HCC) 09/22/2013    Priority: High   Permanent atrial fibrillation (HCC) 09/21/2013    Priority: High   H/O sinus bradycardia 08/10/2012    Priority: High   Combined systolic and diastolic congestive heart failure (HCC) 11/17/2011    Priority: High   Coronary artery disease involving native coronary artery of native heart with angina pectoris (HCC) 08/03/2007    Priority: High   Osteoporosis 06/28/2015    Priority: Medium   Vitamin B12 deficiency 07/28/2014    Priority: Medium   CKD (chronic kidney disease), stage III (HCC) 09/22/2013    Priority: Medium   LBBB (left bundle branch block) 09/22/2013    Priority: Medium   Interstitial cystitis     Priority: Medium   Hyperlipidemia associated with type 2 diabetes mellitus (HCC) 03/07/2008    Priority: Medium   Hypothyroidism 08/03/2007    Priority: Medium   Hypertension associated with diabetes (HCC) 08/03/2007    Priority: Medium   Chronic anticoagulation 07/09/2017    Priority: Low   Left groin hernia 06/25/2017    Priority: Low   Internal hemorrhoids     Priority: Low   Orthostatic hypotension 08/10/2012    Priority: Low   Fatigue 04/20/2012    Priority: Low   ARTHRITIS 12/30/2010    Priority: Low   Left lumbar radiculopathy 12/11/2010    Priority: Low   Esophageal reflux 10/18/2007     Priority: Low   History of Barrett's esophagus 08/03/2007    Priority: Low   Educated about COVID-19 virus infection 05/03/2020   Stenosis of carotid artery 05/03/2020   Chronic atrial fibrillation (HCC) 05/03/2020    Medications- reviewed and updated Current Outpatient Medications  Medication Sig Dispense Refill   acetaminophen (TYLENOL) 500 MG tablet Take 1,000 mg by mouth every 8 (eight) hours as needed.     apixaban (ELIQUIS) 2.5 MG TABS tablet TAKE 1 TABLET TWICE A DAY 180 tablet 1   ascorbic acid (VITAMIN C) 500 MG tablet Take 500 mg by mouth daily.     benzonatate (TESSALON PERLES) 100 MG capsule Take 1 capsule (100 mg total) by mouth 3 (three) times daily as needed. 30 capsule 0   Cholecalciferol (D3) 50 MCG (2000 UT) TABS Take by mouth daily.      Cyanocobalamin (B-12) 3000 MCG CAPS Take by mouth.     fluticasone (FLONASE) 50 MCG/ACT nasal spray Place 1-2 sprays into both nostrils daily. 16 g 0   furosemide (LASIX) 20 MG tablet TAKE 3 TABLETS BY MOUTH EVERY DAY 270 tablet 1   isosorbide mononitrate (IMDUR) 120 MG 24 hr tablet Take 1 tablet (120 mg total) by mouth daily. NEED OV. 90 tablet 1   KLOR-CON M10 10 MEQ tablet TAKE 1 TABLET BY MOUTH EVERY DAY 90 tablet 0   lansoprazole (PREVACID) 30 MG capsule TAKE 1 CAPSULE TWICE DAILY 180 capsule 1   loratadine (  CLARITIN) 10 MG tablet Take 1 tablet (10 mg total) by mouth daily. 10 tablet 0   nitroGLYCERIN (NITROSTAT) 0.4 MG SL tablet 1 TAB UNDER THE TONGUE EVERY 5 MINS AS NEEDED FOR CHEST PAIN (IF HAVE PAIN AFTER 3 PILLS- CALL 911). 50 tablet 3   pravastatin (PRAVACHOL) 40 MG tablet TAKE 1 TABLET BY MOUTH EVERY DAY 90 tablet 1   SYNTHROID 50 MCG tablet TAKE 1 TABLET DAILY BEFORE BREAKFAST. 90 tablet 3   traMADol (ULTRAM) 50 MG tablet Take 1 tablet (50 mg total) by mouth every 6 (six) hours as needed. 15 tablet 0   valsartan (DIOVAN) 160 MG tablet Take 1 tablet (160 mg total) by mouth daily. 90 tablet 3   albuterol (VENTOLIN HFA) 108  (90 Base) MCG/ACT inhaler Inhale 2 puffs into the lungs every 6 (six) hours as needed for wheezing or shortness of breath. (Patient not taking: Reported on 07/09/2021) 8 g 2   ONETOUCH VERIO test strip USE TO TEST BLOOD SUGARS DAILY. DX: E11.9 100 strip 3   No current facility-administered medications for this visit.     Objective:  BP 105/63   Pulse 90   Temp 98.6 F (37 C) (Temporal)   Ht 5\' 3"  (1.6 m)   Wt 144 lb 3.2 oz (65.4 kg)   SpO2 96%   BMI 25.54 kg/m  Gen: NAD, resting comfortably CV: RRR no murmurs rubs or gallops Lungs: CTAB no crackles, wheeze, rhonchi Abdomen: soft/nontender initially- on 2nd exam some pain with deeper palpation/nondistended/normal bowel sounds. No rebound or guarding.  Ext: no edema Skin: warm, dry Neuro: hard of hearing    Assessment and Plan  #Abnormal x-ray of Chest 06/25/2021 S:Patient had an X-ray done  on 06/25/21 due to having a cough lasting 1.5 weeks at visit on 06/25/21. Findings were showed Chronic bronchitic and progressive interstitial lung disease changes, question acute atypical infection or progression of chronic interstitial lung disease, and potential 7 mm nodular density at RIGHT apex; CT chest recommended to exclude pulmonary nodule. Patient was given tessalon perles for cough. Patient states over 2 weeks ago had a fever of 102 and having cough since that time. Had been around someone sick when getting hari done.   From result note from [facility] "Abnormal CXR: chronic bronchitis and possible pneumonia. Sent oral prednisone, albuterol and levaquin"  Today patient reports was never tested for covid through all of this illness. She is fully vaccinated from covid  Today, patient reports she never tried the inhaler. She took prednisone and antibiotic. Tessalon perles help some. She felt better on prednisone and antibiotic.  A/P: patient with subacute cough of about a month now- CXR with possible bronchitis/pneumonia or interstitial  lung disease. She took prednisone and levaquin and symptom simproved but cough improves. Could have post viral cough or post bacterial cough. I did encourage her to try  the albuterol to see if that would help- not much wheezing so may not help.  -try albuterol for cough- if CT better could consider another round of prednisone - could be post covid cough as never tested but testing at this point unlikely helpful  # lower abdominal pain S: Patient with lower abdominal pain reported at April 16, 2021 visit-radiated into right lower quadrant from lower abdomen (she has some LLQ pain as well from a prior hernia).  History of appendectomy and cholecystectomy.  We did not think ultrasound would be beneficial.  Blood work and urine were largely reassuring at that time.  We did not think she was best candidate for colonoscopy.  Instead we discussed that if symptoms fail to improve order CT abdomen pelvis to see if there is an obvious large burden of cancer-she is now scheduled for this tomorrow  She missed first CT and is now rescheduled for tomorrow.   She reports having intermittent lower abdominal pain  From prior notes " #Hernia LLQ- does not want to pursue surgery at present but will let me know if needed- would only want to do if could do spinal anesthesia. Rough experience with general anesthesia- states was awake but couldn't move her body" A/P: patient with lower abdominal pain radiating into RLQ- she reports history of hernia (which I do not palpate on exam) for several months- wants to finish getting evaluation with scan already ordered    #Permanent atrial fibrillation S: Compliant with Eliquis 2.5 mg twice a day.  Not on rate control- Metoprolol 25mg  twice a day for rate control stopped after heart monitor per 06/11/20 visit- patient had continued to tell me in prior visits she was taking this and even today stated she was- but when we reviewed chart it had bene stopped and later refused- she will  double check at home to make sure at home A/P: Appropriately anticoagulated.  Unclear if she is using metoprolol for rate control -Per notes she should be off of this but she is not 100% sure (was having more slow heart rates)-her son will help 08/11/20 check on this this evening  # Diabetes S: Medication:none/diet controlled  Lab Results  Component Value Date   HGBA1C 6.4 04/16/2021   HGBA1C 6.2 (H) 10/16/2020   HGBA1C 6.0 (H) 04/13/2020  A/P: has been controlled without meds- too soon for repeat  #Combined systolic/diastolic congestive heart failure. S: Compliant with Lasix 60 mg daily. Wt Readings from Last 3 Encounters:  07/09/21 144 lb 3.2 oz (65.4 kg)  06/25/21 147 lb 3.2 oz (66.8 kg)  04/16/21 152 lb (68.9 kg)  A/P: weight down a few lbs from last visit - if continues to lose weight may update further labs- will be getting rather thorough malignancy screening with CT C/A/P  #CAD with angina status post coronary angioplasty in 2001 and 2003/hyperlipidemia S: Compliant with pravastatin 40 mg. Has been largely chest pain-free on Imdur- some twinges of pain with cough  -Not on aspirin due to being on Eliquis .  A/P:CAD appears stable- continue current meds    #Hypertension/CKD stage III S: Compliant with Imdur 120 mg, metoprolol 25 mg twice a day, Lasix 60 mg, valsartan 160 mg daily. GFR has been stable in the 35-50 range. Has had orthostatic intolerance in the past   BP Readings from Last 3 Encounters:  07/09/21 105/63  05/16/21 124/76  04/16/21 132/88  A/P: Blood pressure well controlled-continue current medication   #Hypothyroidism S: Compliant with synthroid 50 mcg Lab Results  Component Value Date   TSH 0.83 04/16/2021   A/P: Well-controlled-continue current medication  #we had eye exam option today- she declines- will send 06/16/2021 copy of upcoming appointment. Hold off on vaccines today with illness  Recommended follow up: keep December visit unless you need January  sooner Future Appointments  Date Time Provider Department Center  07/10/2021 10:20 AM [facility] 1 [facility]  10/18/2021  1:40 PM 14/07/2021 Durene Cal, MD [facility]    Lab/Order associations:   ICD-10-CM   1. Interstitial pulmonary disease (HCC)  J84.9 CT Chest Wo Contrast  2. Pulmonary nodule  R91.1 CT Chest Wo Contrast    3. Chronic RLQ pain  R10.31    G89.29     4. Permanent atrial fibrillation (HCC)  I48.21     5. Stage 3 chronic kidney disease, unspecified whether stage 3a or 3b CKD (HCC)  N18.30     6. Hypertension associated with diabetes (HCC)  E11.59    I15.2     7. Combined systolic and diastolic congestive heart failure, unspecified HF chronicity (HCC)  I50.40     8. Hyperlipidemia associated with type 2 diabetes mellitus (HCC)  E11.69    E78.5     9. Hypothyroidism, unspecified type  E03.9      I,Harris Phan,acting as a scribe for Tana Conch, MD.,have documented all relevant documentation on the behalf of Tana Conch, MD,as directed by  Tana Conch, MD while in the presence of Tana Conch, MD.  I, Tana Conch, MD, have reviewed all documentation for this visit. The documentation on 07/09/21 for the exam, diagnosis, procedures, and orders are all accurate and complete.   Return precautions advised.  Tana Conch, MD

## 2021-07-09 NOTE — Patient Instructions (Addendum)
Health Maintenance Due  Topic Date Due   Zoster Vaccines- Shingrix (1 of 2) Please check with your pharmacy to see if they have the shingrix vaccine. If they do- please get this immunization and update [place] by phone call or mychart with dates you receive the vaccine   Defer any immunization until current illness is improved. Never done   COVID-19 Vaccine (4 - Booster for [employer] series) Will get this scheduled for the fall.  I suggest waiting for the new strand specific COVID-19 vaccination.  Defer any immunization until current illness is improved. 12/03/2020   OPHTHALMOLOGY EXAM If you have had your eye exam within the last year, please sign release of information at check out desk. If you have not had an eye exam within a year, please get one at this time. 03/15/2021   INFLUENZA VACCINE Patient will get this scheduled for a later date.   Defer any immunization until current illness is improved. 06/10/2021   Team please see if CT of Chest can be added to her visit with [facility] tomorrow.  Please try using your inhaler to help improve your cough.  If your CT results are better, we could consider placing you on Prednison again   Please check at home to see if you are still taking metoprolol 25 mg- you should be off of this medication per Dr. Antoine Poche from cardiology.  Recommended follow up: Keep December visit unless you would need [place] sooner.

## 2021-07-10 ENCOUNTER — Inpatient Hospital Stay: Admission: RE | Admit: 2021-07-10 | Payer: Medicare Other | Source: Ambulatory Visit

## 2021-07-10 ENCOUNTER — Ambulatory Visit
Admission: RE | Admit: 2021-07-10 | Discharge: 2021-07-10 | Disposition: A | Discharge status: Home / Self Care | Payer: Medicare Other | Source: Ambulatory Visit | Attending: Family Medicine | Admitting: Family Medicine

## 2021-07-10 DIAGNOSIS — K7689 Other specified diseases of liver: Secondary | ICD-10-CM | POA: Diagnosis not present

## 2021-07-10 DIAGNOSIS — R911 Solitary pulmonary nodule: Secondary | ICD-10-CM | POA: Diagnosis not present

## 2021-07-10 DIAGNOSIS — N3289 Other specified disorders of bladder: Secondary | ICD-10-CM | POA: Diagnosis not present

## 2021-07-10 DIAGNOSIS — K5730 Diverticulosis of large intestine without perforation or abscess without bleeding: Secondary | ICD-10-CM | POA: Diagnosis not present

## 2021-07-10 DIAGNOSIS — R079 Chest pain, unspecified: Secondary | ICD-10-CM | POA: Diagnosis not present

## 2021-07-10 DIAGNOSIS — R1030 Lower abdominal pain, unspecified: Secondary | ICD-10-CM

## 2021-07-10 DIAGNOSIS — J849 Interstitial pulmonary disease, unspecified: Secondary | ICD-10-CM

## 2021-07-10 DIAGNOSIS — K4090 Unilateral inguinal hernia, without obstruction or gangrene, not specified as recurrent: Secondary | ICD-10-CM | POA: Diagnosis not present

## 2021-07-10 DIAGNOSIS — I700 Atherosclerosis of aorta: Secondary | ICD-10-CM | POA: Diagnosis not present

## 2021-07-10 DIAGNOSIS — R918 Other nonspecific abnormal finding of lung field: Secondary | ICD-10-CM | POA: Diagnosis not present

## 2021-07-10 MED ORDER — IOPAMIDOL (ISOVUE-300) INJECTION 61%
100.0000 mL | Freq: Once | INTRAVENOUS | Status: AC | PRN
  Administered 2021-07-10: 100 mL via INTRAVENOUS

## 2021-07-11 ENCOUNTER — Ambulatory Visit: Payer: Medicare Other | Admitting: Family Medicine

## 2021-07-11 ENCOUNTER — Encounter: Payer: Self-pay | Admitting: Family Medicine

## 2021-07-11 DIAGNOSIS — I7121 Aneurysm of the ascending aorta, without rupture: Secondary | ICD-10-CM | POA: Insufficient documentation

## 2021-07-11 DIAGNOSIS — I712 Thoracic aortic aneurysm, without rupture: Secondary | ICD-10-CM | POA: Insufficient documentation

## 2021-07-12 ENCOUNTER — Telehealth: Payer: Self-pay

## 2021-07-12 ENCOUNTER — Other Ambulatory Visit: Payer: Self-pay | Admitting: *Deleted

## 2021-07-12 DIAGNOSIS — R059 Cough, unspecified: Secondary | ICD-10-CM

## 2021-07-12 DIAGNOSIS — K3189 Other diseases of stomach and duodenum: Secondary | ICD-10-CM

## 2021-07-12 NOTE — Telephone Encounter (Signed)
See result notes. 

## 2021-07-12 NOTE — Telephone Encounter (Signed)
Pt's son called to check the status of Ahnesti's scan that was done. He would like a call at [phone]. Please Advise.

## 2021-07-12 NOTE — Telephone Encounter (Signed)
We sent in albuterol- was that helpful?   I was considering prednisone- lets see how she does over the weekend first- no clear bacterial infection found so hoping this will improve further

## 2021-07-12 NOTE — Telephone Encounter (Signed)
Dr. Durene Cal spoke to pt's son Victoria Gomez gave him results of CT scans. He asked if you were going to send in anything for her cough? He said you discussed possibly antibiotics or cough medicine once you had scans back. Please advise.

## 2021-07-16 MED ORDER — PREDNISONE 20 MG PO TABS: ORAL_TABLET | ORAL | 0 refills | Status: DC

## 2021-07-16 MED ORDER — BENZONATATE 100 MG PO CAPS: 100.0000 mg | ORAL_CAPSULE | Freq: Three times a day (TID) | ORAL | 0 refills | Status: DC | PRN

## 2021-07-16 NOTE — Telephone Encounter (Signed)
See below

## 2021-07-16 NOTE — Telephone Encounter (Signed)
Attempted to reach pt/pt son however, the mailbox was full and I could not leave a message. Will try again later.

## 2021-07-16 NOTE — Telephone Encounter (Signed)
Called and spoke with pt and made her aware. 

## 2021-07-16 NOTE — Telephone Encounter (Signed)
Patient is calling in stating the albuterol only helped a little bit, and wanting cough medicine as she cant stop coughing.

## 2021-07-19 ENCOUNTER — Other Ambulatory Visit: Payer: Self-pay | Admitting: Nurse Practitioner

## 2021-07-19 DIAGNOSIS — J40 Bronchitis, not specified as acute or chronic: Secondary | ICD-10-CM

## 2021-08-05 ENCOUNTER — Telehealth: Payer: Self-pay | Admitting: Family Medicine

## 2021-08-05 NOTE — Telephone Encounter (Signed)
Tried calling  patient to call back and schedule Medicare Annual Wellness Visit (AWV) either virtually OR in office.   No answer  Last AWV 08/01/2019 ; please schedule at anytime with [facility] at [facility]  This should be a 45 minute visit.

## 2021-08-14 ENCOUNTER — Telehealth: Payer: Self-pay | Admitting: Pharmacist

## 2021-08-14 NOTE — Chronic Care Management (AMB) (Signed)
Chronic Care Management Pharmacy Assistant   Name: Victoria Gomez  MRN: [id number] DOB: Jan 28, 1925   Reason for Encounter: Hypertension Adherence Call    Recent office visits:  07/09/2021 OV (PCP) Shelva Majestic, MD; no medication changes indicated.  Recent consult visits:  None  Hospital visits:  None in previous 6 months  Medications: Outpatient Encounter Medications as of 08/14/2021  Medication Sig   acetaminophen (TYLENOL) 500 MG tablet Take 1,000 mg by mouth every 8 (eight) hours as needed.   albuterol (VENTOLIN HFA) 108 (90 Base) MCG/ACT inhaler Inhale 2 puffs into the lungs every 6 (six) hours as needed for wheezing or shortness of breath. (Patient not taking: Reported on 07/09/2021)   apixaban (ELIQUIS) 2.5 MG TABS tablet TAKE 1 TABLET TWICE A DAY   ascorbic acid (VITAMIN C) 500 MG tablet Take 500 mg by mouth daily.   benzonatate (TESSALON PERLES) 100 MG capsule Take 1 capsule (100 mg total) by mouth 3 (three) times daily as needed.   Cholecalciferol (D3) 50 MCG (2000 UT) TABS Take by mouth daily.    Cyanocobalamin (B-12) 3000 MCG CAPS Take by mouth.   fluticasone (FLONASE) 50 MCG/ACT nasal spray Place 1-2 sprays into both nostrils daily.   furosemide (LASIX) 20 MG tablet TAKE 3 TABLETS BY MOUTH EVERY DAY   isosorbide mononitrate (IMDUR) 120 MG 24 hr tablet Take 1 tablet (120 mg total) by mouth daily. NEED OV.   KLOR-CON M10 10 MEQ tablet TAKE 1 TABLET BY MOUTH EVERY DAY   lansoprazole (PREVACID) 30 MG capsule TAKE 1 CAPSULE TWICE DAILY   loratadine (CLARITIN) 10 MG tablet Take 1 tablet (10 mg total) by mouth daily.   nitroGLYCERIN (NITROSTAT) 0.4 MG SL tablet 1 TAB UNDER THE TONGUE EVERY 5 MINS AS NEEDED FOR CHEST PAIN (IF HAVE PAIN AFTER 3 PILLS- CALL 911).   ONETOUCH VERIO test strip USE TO TEST BLOOD SUGARS DAILY. DX: E11.9   pravastatin (PRAVACHOL) 40 MG tablet TAKE 1 TABLET BY MOUTH EVERY DAY   predniSONE (DELTASONE) 20 MG tablet Take 1 tablet by mouth daily  for 5 days, then 1/2 tablet daily for 2 days   SYNTHROID 50 MCG tablet TAKE 1 TABLET DAILY BEFORE BREAKFAST.   traMADol (ULTRAM) 50 MG tablet Take 1 tablet (50 mg total) by mouth every 6 (six) hours as needed.   valsartan (DIOVAN) 160 MG tablet Take 1 tablet (160 mg total) by mouth daily.   No facility-administered encounter medications on file as of 08/14/2021.   Reviewed chart prior to disease state call. Spoke with patient regarding BP  Recent Office Vitals: BP Readings from Last 3 Encounters:  07/09/21 105/63  05/16/21 124/76  04/16/21 132/88   Pulse Readings from Last 3 Encounters:  07/09/21 90  05/16/21 86  04/16/21 81    Wt Readings from Last 3 Encounters:  07/09/21 144 lb 3.2 oz (65.4 kg)  06/25/21 147 lb 3.2 oz (66.8 kg)  04/16/21 152 lb (68.9 kg)     Kidney Function Lab Results  Component Value Date/Time   CREATININE 1.04 04/16/2021 03:37 PM   CREATININE 0.96 (H) 10/16/2020 03:49 PM   CREATININE 1.25 (H) 04/13/2020 04:27 PM   GFR 46.05 (L) 04/16/2021 03:37 PM   GFRNONAA 51 (L) 10/16/2020 03:49 PM   GFRAA 59 (L) 10/16/2020 03:49 PM    BMP Latest Ref Rng & Units 04/16/2021 10/16/2020 04/13/2020  Glucose 70 - 99 mg/dL 90 91 95  BUN 6 - 23 mg/dL 50(D) 22 24  Creatinine 0.40 - 1.20 mg/dL 4.69 6.29(B) 2.84(X)  BUN/Creat Ratio 6 - 22 (calc) - 23(H) 19  Sodium 135 - 145 mEq/L 144 142 144  Potassium 3.5 - 5.1 mEq/L 4.0 4.2 4.5  Chloride 96 - 112 mEq/L 106 105 108  CO2 19 - 32 mEq/L 29 29 27   Calcium 8.4 - 10.5 mg/dL 9.5 9.7 9.4    Current antihypertensive regimen:   How often are you checking your Blood Pressure?  Current home BP readings:  What recent interventions/DTPs have been made by any provider to improve Blood Pressure control since last CPP Visit:  Any recent hospitalizations or ED visits since last visit with CPP?  What diet changes have been made to improve Blood Pressure Control?   What exercise is being done to improve your Blood Pressure Control?     Adherence Review: Is the patient currently on ACE/ARB medication?  Does the patient have >5 day gap between last estimated fill dates?   **Unable to reach patient to complete this call. Several unsuccessful attempts made to contact patient for hypertension adherence call.**   Future Appointments  Date Time Provider Department Center  10/18/2021  1:40 PM 14/07/2021, MD [facility]     April D Calhoun, [facility] Clinical Pharmacist Assistant [phone]

## 2021-08-30 ENCOUNTER — Telehealth: Payer: Self-pay

## 2021-08-30 NOTE — Telephone Encounter (Signed)
At this point with info coming in after hours- reasonable for ER. Wish had info earlier and could have tried to work her in for earlier assessment

## 2021-08-30 NOTE — Telephone Encounter (Signed)
Patient has called back in to speak with Triage.  Patient disconnected the line. Triage is going to give patient a call back.

## 2021-09-05 ENCOUNTER — Other Ambulatory Visit: Payer: Self-pay

## 2021-09-05 ENCOUNTER — Encounter: Payer: Self-pay | Admitting: Family Medicine

## 2021-09-05 ENCOUNTER — Ambulatory Visit (INDEPENDENT_AMBULATORY_CARE_PROVIDER_SITE_OTHER): Payer: Medicare Other | Admitting: Family Medicine

## 2021-09-05 VITALS — BP 124/78 | HR 63 | Temp 97.3°F | Ht 62.99 in | Wt 145.8 lb

## 2021-09-05 DIAGNOSIS — Z23 Encounter for immunization: Secondary | ICD-10-CM | POA: Diagnosis not present

## 2021-09-05 DIAGNOSIS — I4821 Permanent atrial fibrillation: Secondary | ICD-10-CM

## 2021-09-05 DIAGNOSIS — R102 Pelvic and perineal pain: Secondary | ICD-10-CM

## 2021-09-05 DIAGNOSIS — N183 Chronic kidney disease, stage 3 unspecified: Secondary | ICD-10-CM

## 2021-09-05 DIAGNOSIS — E785 Hyperlipidemia, unspecified: Secondary | ICD-10-CM | POA: Diagnosis not present

## 2021-09-05 DIAGNOSIS — I152 Hypertension secondary to endocrine disorders: Secondary | ICD-10-CM

## 2021-09-05 DIAGNOSIS — I7121 Aneurysm of the ascending aorta, without rupture: Secondary | ICD-10-CM | POA: Diagnosis not present

## 2021-09-05 DIAGNOSIS — E1169 Type 2 diabetes mellitus with other specified complication: Secondary | ICD-10-CM | POA: Diagnosis not present

## 2021-09-05 DIAGNOSIS — E1159 Type 2 diabetes mellitus with other circulatory complications: Secondary | ICD-10-CM

## 2021-09-05 DIAGNOSIS — E039 Hypothyroidism, unspecified: Secondary | ICD-10-CM

## 2021-09-05 DIAGNOSIS — E1122 Type 2 diabetes mellitus with diabetic chronic kidney disease: Secondary | ICD-10-CM | POA: Diagnosis not present

## 2021-09-05 DIAGNOSIS — I25119 Atherosclerotic heart disease of native coronary artery with unspecified angina pectoris: Secondary | ICD-10-CM | POA: Diagnosis not present

## 2021-09-05 LAB — POC URINALSYSI DIPSTICK (AUTOMATED)
Bilirubin, UA: NEGATIVE
Blood, UA: NEGATIVE
Glucose, UA: NEGATIVE
Ketones, UA: NEGATIVE
Nitrite, UA: NEGATIVE
Protein, UA: NEGATIVE
Spec Grav, UA: 1.01 (ref 1.010–1.025)
Urobilinogen, UA: 0.2 E.U./dL
pH, UA: 7 (ref 5.0–8.0)

## 2021-09-05 NOTE — Patient Instructions (Addendum)
Health Maintenance Due  Topic Date Due   Zoster Vaccines- Shingrix (1 of 2) - Please check with your pharmacy to see if they have the shingrix vaccine. If they do- please get this immunization and update [place] by phone call or mychart with dates you receive the vaccine  Never done   COVID-19 Vaccine (4 - Booster for [employer] series) -Recommend getting Omicron/Bivalent booster only at your local pharmacy! Please let us know when you have received this vaccination.  10/28/2020   OPHTHALMOLOGY EXAM  - have them send [place] a copy after you seen them soon 03/15/2021   INFLUENZA VACCINE  - High dose flu shot 06/10/2021   Please stop by lab before you go- urine only If you have mychart- we will send your results within 3 business days of [place] receiving them.  If you do not have mychart- we will call you about results within 5 business days of [place] receiving them.  *please also note that you will see labs on mychart as soon as they post. I will later go in and write notes on them- will say "notes from Dr. Durene Cal"  Please contact [facility] with # below Address: [address] [place], [place], [place] [address] Phone: [phone] Please make sure to also mention the pain you are also experiencing in addition to the need for endoscopy  Call [facility] for a follow-up.I wonder if your pain could be coming from your interstitial cystitis  Recommended follow up: Return for as needed for new, worsening, persistent symptoms. -lets check in at December visit unless you have worsening symptoms

## 2021-09-05 NOTE — Addendum Note (Signed)
Addended by: Lorn Junes on: 09/05/2021 03:28 PM   Modules accepted: Orders

## 2021-09-05 NOTE — Progress Notes (Addendum)
Phone [phone] In person visit   Subjective:   Victoria Gomez is a 87 y.o. year old very pleasant female patient who presents for/with See problem oriented charting Chief Complaint  Patient presents with   Abdominal Pain    Patient having pain in her lower abdomen on the right side. Pain his been going on for about two weeks. No bruising or swelling. No nausea or vomiting.    This visit occurred during the SARS-CoV-2 public health emergency.  Safety protocols were in place, including screening questions prior to the visit, additional usage of staff PPE, and extensive cleaning of exam room while observing appropriate contact time as indicated for disinfecting solutions.   Past Medical History-  Patient Active Problem List   Diagnosis Date Noted   Ascending aortic aneurysm 07/11/2021    Priority: 1.   Controlled type 2 diabetes mellitus with renal manifestation (HCC) 09/22/2013    Priority: 1.   Permanent atrial fibrillation (HCC) 09/21/2013    Priority: 1.   H/O sinus bradycardia 08/10/2012    Priority: 1.   Combined systolic and diastolic congestive heart failure (HCC) 11/17/2011    Priority: 1.   Coronary artery disease involving native coronary artery of native heart with angina pectoris (HCC) 08/03/2007    Priority: 1.   Osteoporosis 06/28/2015    Priority: 2.   Vitamin B12 deficiency 07/28/2014    Priority: 2.   CKD (chronic kidney disease), stage III (HCC) 09/22/2013    Priority: 2.   LBBB (left bundle branch block) 09/22/2013    Priority: 2.   Interstitial cystitis     Priority: 2.   Hyperlipidemia associated with type 2 diabetes mellitus (HCC) 03/07/2008    Priority: 2.   Hypothyroidism 08/03/2007    Priority: 2.   Hypertension associated with diabetes (HCC) 08/03/2007    Priority: 2.   Chronic anticoagulation 07/09/2017    Priority: 3.   Left groin hernia 06/25/2017    Priority: 3.   Internal hemorrhoids     Priority: 3.   Orthostatic hypotension  08/10/2012    Priority: 3.   Fatigue 04/20/2012    Priority: 3.   ARTHRITIS 12/30/2010    Priority: 3.   Left lumbar radiculopathy 12/11/2010    Priority: 3.   Esophageal reflux 10/18/2007    Priority: 3.   History of Barrett's esophagus 08/03/2007    Priority: 3.   Educated about COVID-19 virus infection 05/03/2020   Stenosis of carotid artery 05/03/2020   Chronic atrial fibrillation (HCC) 05/03/2020    Medications- reviewed and updated Current Outpatient Medications  Medication Sig Dispense Refill   acetaminophen (TYLENOL) 500 MG tablet Take 1,000 mg by mouth every 8 (eight) hours as needed.     albuterol (VENTOLIN HFA) 108 (90 Base) MCG/ACT inhaler Inhale 2 puffs into the lungs every 6 (six) hours as needed for wheezing or shortness of breath. 8 g 2   apixaban (ELIQUIS) 2.5 MG TABS tablet TAKE 1 TABLET TWICE A DAY 180 tablet 1   ascorbic acid (VITAMIN C) 500 MG tablet Take 500 mg by mouth daily.     benzonatate (TESSALON PERLES) 100 MG capsule Take 1 capsule (100 mg total) by mouth 3 (three) times daily as needed. 30 capsule 0   Cholecalciferol (D3) 50 MCG (2000 UT) TABS Take by mouth daily.      Cyanocobalamin (B-12) 3000 MCG CAPS Take by mouth.     fluticasone (FLONASE) 50 MCG/ACT nasal spray Place 1-2 sprays into both nostrils daily.  16 g 0   furosemide (LASIX) 20 MG tablet TAKE 3 TABLETS BY MOUTH EVERY DAY 270 tablet 1   isosorbide mononitrate (IMDUR) 120 MG 24 hr tablet Take 1 tablet (120 mg total) by mouth daily. NEED OV. 90 tablet 1   KLOR-CON M10 10 MEQ tablet TAKE 1 TABLET BY MOUTH EVERY DAY 90 tablet 0   lansoprazole (PREVACID) 30 MG capsule TAKE 1 CAPSULE TWICE DAILY 180 capsule 1   loratadine (CLARITIN) 10 MG tablet Take 1 tablet (10 mg total) by mouth daily. 10 tablet 0   nitroGLYCERIN (NITROSTAT) 0.4 MG SL tablet 1 TAB UNDER THE TONGUE EVERY 5 MINS AS NEEDED FOR CHEST PAIN (IF HAVE PAIN AFTER 3 PILLS- CALL 911). 50 tablet 3   ONETOUCH VERIO test strip USE TO TEST  BLOOD SUGARS DAILY. DX: E11.9 100 strip 3   pravastatin (PRAVACHOL) 40 MG tablet TAKE 1 TABLET BY MOUTH EVERY DAY 90 tablet 1   predniSONE (DELTASONE) 20 MG tablet Take 1 tablet by mouth daily for 5 days, then 1/2 tablet daily for 2 days 6 tablet 0   SYNTHROID 50 MCG tablet TAKE 1 TABLET DAILY BEFORE BREAKFAST. 90 tablet 3   traMADol (ULTRAM) 50 MG tablet Take 1 tablet (50 mg total) by mouth every 6 (six) hours as needed. 15 tablet 0   valsartan (DIOVAN) 160 MG tablet Take 1 tablet (160 mg total) by mouth daily. 90 tablet 3   No current facility-administered medications for this visit.     Objective:  BP 124/78   Pulse 63   Temp (!) 97.3 F (36.3 C)   Ht 5' 2.99" (1.6 m)   Wt 145 lb 12.8 oz (66.1 kg)   SpO2 99%   BMI 25.83 kg/m  Gen: NAD, resting comfortably CV: Regular heart rate Lungs: CTAB no crackles, wheeze, rhonchi Abdomen: Patient with mild diffuse abdominal pain-marked tenderness in suprapubic area but not in right lower quadrant today.  Patient reports feeling swollen in this area but no visual swelling Ext: Trace edema under compression stockings Skin: warm, dry     Assessment and Plan    #Suprapubic and right lower quadrant pain S:At August visit patient reported lower abdominal pain which radiated in the right lower quadrant from lower abdomen.  Also had some left lower quadrant pain from prior hernia.  History of appendectomy and cholecystectomy.  She had a CT scan pending at that time which was done after visit.  There was stool present throughout the colon.  Uterus and appendix surgically absent.  She had a small left inguinal hernia containing fat only.  She did have some bladder wall thickening and irregularity consistent with history of interstitial cystitis.  Had diverticulosis without diverticulitis.  She had some wall thickening of the gastric cardia with question of gastritis versus mass-endoscopy was recommended had some biliary dilation with recommended  correlation with LFTs which were normal in June. Son reports diarrhea intermittently- she reports has only had once  Ultimately after scan referral to GI was placed on 07/12/2021-I do not see a visit yet scheduled-we gave contact again today  Today/more recently patient has had a lower abdominal pain on the right side for about 2 weeks she reports (worsening compared to the few weeks prior) no bruising or swelling.  No nausea or vomiting. Feels like she is swollen in the RLQ- hurts suprapubic and then into RLQ and then up right lateral side of abdomen. Fluctuating course. BM once a day and not straining. Pain lasts  for 5 minutes and more common after eating and before bed. Most of the day does not bother her. Same pain as when she was evaluated. No dysuria. Does have urinary frquency and nocturia. Has urgency as well as incontinence  We reviewed [facility] note from 08/28/2020 and patient was having similar pain at that time-patient had been taken off of Elmiron by her ophthalmologist due to concerns about effects on the eyes-they were going to try [facility] and plan was for 2-week follow-up-I do not see that patient has been seen in follow-up since that time A/P: Reviewing records from [facility]-it appears patient has been having similar pain for about a year and seem to correspond with coming off of Elmiron-encouraged patient to schedule follow-up with [facility].  Since symptoms have worsened in the last 2 weeks we will also check for UTI-would treat only based off of culture but get UA as well-expect abnormalities due to interstitial cystitis -Has left-sided hernia but not having much left-sided pain-I do not think left-sided hernia is causing her current pains  Patient also with history of Barrett's esophagus and some wall thickening of gastric cardia-reinforced follow-up with GI-previously we had planned not to schedule follow-up due to her age but with changes on CT want their  opinion  #Permanent atrial fibrillation S: Compliant with Eliquis 2.5 mg twice a day.  -Rate controlled without medication-due to bradycardia A/P: Appropriately anticoagulated-also rate controlled without medication.  Continue current medication.  There is some risk to taking her off of Eliquis short-term for any bladder or endoscopic procedures but benefits may outweigh risks due to persistent issues with intermittent abdominal pain  #Diabetes mellitus S: Medication: Has been diet controlled. Lab Results  Component Value Date   HGBA1C 6.4 04/16/2021   HGBA1C 6.2 (H) 10/16/2020   HGBA1C 6.0 (H) 04/13/2020  A/P: Too soon for repeat-we will check A1c next visit-hopefully remains diet controlled  #Combined systolic/diastolic congestive heart failure. S: Medication: Compliant with Lasix 60 mg daily A/P: No increased edema or weight-appears largely euvolemic-continue current medication  #Hypertension/CKD stage III S: Medication: Compliant with Imdur 120 mg, Lasix 60 mg, valsartan 160 mg dail - GFR has been stable in the 35-50 range. Has had orthostatic intolerance in the past-most recently at 46 BP Readings from Last 3 Encounters:  09/05/21 124/78  07/09/21 105/63  05/16/21 124/76  A/P: Blood pressure well controlled-continue current medication.  CKD stage III has been stable  #Aneurysm of aorta-and pulmonary nodule-plan was for 6 to 86-month follow-up after August CT of the chest-we will plan on this sometime next year.  I do not want to treat her blood pressure more aggressively due to her age and prior orthostatic symptoms  Recommended follow up: Return for as needed for new, worsening, persistent symptoms. Future Appointments  Date Time Provider Department Center  10/18/2021  1:40 PM Shelva Majestic, MD [facility]    Lab/Order associations:   ICD-10-CM   1. Hypertension associated with diabetes (HCC)  E11.59    I15.2     2. Permanent atrial fibrillation (HCC)  I48.21      3. Controlled type 2 diabetes mellitus with stage 3 chronic kidney disease, without long-term current use of insulin (HCC)  E11.22    N18.30     4. Hyperlipidemia associated with type 2 diabetes mellitus (HCC)  E11.69    E78.5     5. Hypothyroidism, unspecified type  E03.9     6. Suprapubic abdominal pain  R10.2 POCT Urinalysis  Dipstick (Automated)    Urine Culture    7. Need for immunization against influenza  Z23 Flu Vaccine QUAD High Dose(Fluad)    8. Aneurysm of ascending aorta without rupture  I71.21      Time Spent: 40 minutes of total time (2:26 PM- 3:65 PM) was spent on the date of the encounter performing the following actions: chart review prior to seeing the patient, obtaining history, performing a medically necessary exam, counseling on the treatment plan (patient needed reiteration on several occasions), placing orders, and documenting in our EHR.   I,Harris Phan,acting as a Neurosurgeon for Tana Conch, MD.,have documented all relevant documentation on the behalf of Tana Conch, MD,as directed by  Tana Conch, MD while in the presence of Tana Conch, MD.    I, Tana Conch, MD, have reviewed all documentation for this visit. The documentation on 09/05/21 for the exam, diagnosis, procedures, and orders are all accurate and complete.   Return precautions advised.  Tana Conch, MD

## 2021-09-05 NOTE — Addendum Note (Signed)
Addended by: Shelva Majestic on: 09/05/2021 05:29 PM   Modules accepted: Level of Service

## 2021-09-06 LAB — URINE CULTURE
MICRO NUMBER:: 12560260
SPECIMEN QUALITY:: ADEQUATE

## 2021-09-15 ENCOUNTER — Other Ambulatory Visit: Payer: Self-pay | Admitting: Nurse Practitioner

## 2021-09-15 ENCOUNTER — Other Ambulatory Visit: Payer: Self-pay | Admitting: Family Medicine

## 2021-09-15 ENCOUNTER — Other Ambulatory Visit: Payer: Self-pay | Admitting: Cardiology

## 2021-09-15 DIAGNOSIS — J40 Bronchitis, not specified as acute or chronic: Secondary | ICD-10-CM

## 2021-09-17 DIAGNOSIS — R7309 Other abnormal glucose: Secondary | ICD-10-CM | POA: Diagnosis not present

## 2021-09-17 DIAGNOSIS — H353131 Nonexudative age-related macular degeneration, bilateral, early dry stage: Secondary | ICD-10-CM | POA: Diagnosis not present

## 2021-09-17 DIAGNOSIS — H04123 Dry eye syndrome of bilateral lacrimal glands: Secondary | ICD-10-CM | POA: Diagnosis not present

## 2021-09-17 DIAGNOSIS — H35362 Drusen (degenerative) of macula, left eye: Secondary | ICD-10-CM | POA: Diagnosis not present

## 2021-09-17 DIAGNOSIS — H179 Unspecified corneal scar and opacity: Secondary | ICD-10-CM | POA: Diagnosis not present

## 2021-09-17 DIAGNOSIS — H35371 Puckering of macula, right eye: Secondary | ICD-10-CM | POA: Diagnosis not present

## 2021-09-17 LAB — HM DIABETES EYE EXAM

## 2021-09-25 ENCOUNTER — Ambulatory Visit: Payer: Medicare Other | Admitting: Nurse Practitioner

## 2021-09-26 ENCOUNTER — Telehealth: Payer: Self-pay

## 2021-09-26 NOTE — Telephone Encounter (Signed)
Please schedule virtual for pt. 

## 2021-09-26 NOTE — Telephone Encounter (Signed)
Patient Name: Victoria Gomez Gender: Female DOB: 30-Nov-1924 Age: 87 Y 9 M 24 D Return Phone Number: [phone] (Primary) Address: City/ State/ Zip: [place]  [address] Client [facility], [facility] Provider Tana Conch- MD Contact Type Call Who Is Calling Patient / Member / Family / Caregiver Call Type Triage / Clinical Relationship To Patient Self Return Phone Number [phone] (Primary) Chief Complaint Back Pain - General Reason for Call Symptomatic / Request for Health Information Initial Comment Caller states she is having back pain going into legs and feet. Translation No Nurse Assessment Nurse: Kizzie Bane, RN, Marylene Land Date/Time Lamount Cohen Time): 09/26/2021 3:53:48 PM Confirm and document reason for call. If symptomatic, describe symptoms. ---Caller states she is having back pain that is running down her left leg into her foot. She hit her back on a heater and then the pain started but there is no obvious injury Does the patient have any new or worsening symptoms? ---Yes Will a triage be completed? ---Yes Related visit to physician within the last 2 weeks? ---No Does the PT have any chronic conditions? (i.e. diabetes, asthma, this includes High risk factors for pregnancy, etc.) ---Yes List chronic conditions. ---diabetes, heart disease Is this a behavioral health or substance abuse call? ---No Guidelines Guideline Title Affirmed Question Affirmed Notes Nurse Date/Time (Eastern Time) Back Injury Numbness of a leg or foot (i.e., loss of sensation) Kizzie Bane, RN, Marylene Land 09/26/2021 3:58:27 PM Disp. Time Lamount Cohen Time) Disposition Final User 09/26/2021 4:04:03 PM Go to ED Now (or PCP triage) Yes Kizzie Bane, RN, Marylene Land PLEASE NOTE: All timestamps contained within this report are represented as [place] Standard Time. CONFIDENTIALTY NOTICE: This fax transmission is intended only for  the addressee. It contains information that is legally privileged, confidential or otherwise protected from use or disclosure. If you are not the intended recipient, you are strictly prohibited from reviewing, disclosing, copying using or disseminating any of this information or taking any action in reliance on or regarding this information. If you have received this fax in error, please notify us immediately by telephone so that we can arrange for its return to [place]. Phone: [phone], Toll-Free: [phone], Fax: [phone] Page: 2 of 2 Call Id: [id number] Caller Disagree/Comply Comply Caller Understands Yes PreDisposition Did not know what to do Care Advice Given Per Guideline GO TO ED NOW (OR PCP TRIAGE): * IF NO PCP (PRIMARY CARE PROVIDER) SECOND-LEVEL TRIAGE: You need to be seen within the next hour. Go to the ED/UCC at _____________ Hospital. Leave as soon as you can. CARE ADVICE given per Back Injury (Adult) guideline. Referrals [facility] - ED

## 2021-09-26 NOTE — Telephone Encounter (Signed)
Son called stating he believes patient is have sciatica pain in her back and down her leg.    So is requesting a steroid to be called in for patient.  I have advised that patient would need an OV for evaluation.  Son wanted a message to still be sent back to provider to confirm.  Due to difficulty of mobility for patient and living so far away.   Please advise.

## 2021-09-26 NOTE — Telephone Encounter (Signed)
Talked to [facility] son. He will call back to schedule.

## 2021-09-27 ENCOUNTER — Ambulatory Visit (INDEPENDENT_AMBULATORY_CARE_PROVIDER_SITE_OTHER): Payer: Medicare Other | Admitting: Family

## 2021-09-27 ENCOUNTER — Encounter: Payer: Self-pay | Admitting: Family

## 2021-09-27 ENCOUNTER — Other Ambulatory Visit: Payer: Self-pay

## 2021-09-27 VITALS — BP 124/72 | HR 103 | Temp 98.2°F | Resp 18 | Wt 142.4 lb

## 2021-09-27 DIAGNOSIS — S39012A Strain of muscle, fascia and tendon of lower back, initial encounter: Secondary | ICD-10-CM | POA: Insufficient documentation

## 2021-09-27 DIAGNOSIS — M5432 Sciatica, left side: Secondary | ICD-10-CM | POA: Insufficient documentation

## 2021-09-27 MED ORDER — PREDNISONE 20 MG PO TABS: ORAL_TABLET | ORAL | 0 refills | Status: DC

## 2021-09-27 NOTE — Telephone Encounter (Signed)
Pt has OV today

## 2021-09-27 NOTE — Patient Instructions (Addendum)
It was very nice to see you today!  I am sending to prednisone to your pharmacy, start this tomorrow morning.  OK to take 1-2 Tylenol for your back pain. Continue to apply heat or ice for at a time 3-4 times per day to also help with the pain.    PLEASE NOTE:  If you had any lab tests please let us know if you have not heard back within a few days. You may see your results on MyChart before we have a chance to review them but we will give you a call once they are reviewed by [place]. If we ordered any referrals today, please let us know if you have not heard from their office within the next week.   Please try these tips to maintain a healthy lifestyle:  Eat most of your calories during the day when you are active. Eliminate processed foods including packaged sweets (pies, cakes, cookies), reduce intake of potatoes, white bread, white pasta, and white rice. Look for whole grain options, oat flour or almond flour.  Each meal should contain half fruits/vegetables, one quarter protein, and one quarter carbs (no bigger than a computer mouse).  Cut down on sweet beverages. This includes juice, soda, and sweet tea. Also watch fruit intake, though this is a healthier sweet option, it still contains natural sugar! Limit to 3 servings daily.  Drink at least 1 glass of water with each meal and aim for at least 8 glasses per day  Exercise at least 150 minutes every week.

## 2021-09-27 NOTE — Progress Notes (Signed)
Subjective:     Patient ID: Victoria Gomez, female    DOB: 1925/03/02, 87 y.o.   MRN: [id number]  Chief Complaint  Patient presents with   leg and back pain    HPI Back Pain: Patient presents for presents evaluation of low back problems.  Symptoms have been present for 4 days and include pain in left lumbar  (aching and burning in character; 5/10 in severity). Initial inciting event:  sitting too much, pushing a heater down the hall . Symptoms are worst: evening, nighttime. Alleviating factors identifiable by patient are  rest .  Treatments so far initiated by patient:  Tylenol and heat  Previous lower back problems:  chronic lumbar pain . Previous workup: none. Previous treatments: none.    Health Maintenance Due  Topic Date Due   Zoster Vaccines- Shingrix (1 of 2) Never done   COVID-19 Vaccine (4 - Booster for [employer] series) 10/28/2020    Past Medical History:  Diagnosis Date   Arthritis    Atrial fibrillation (HCC)    a. Dx 09/2013.   Bladder disorder    Bradycardia 08/2012   a. Eval for symptomatic bradycardia 08/2012 - beta blocker discontinued.   BREAST MASS, BENIGN    CAD (coronary artery disease)    a. s/p PCI to RCA '01. b. repeat PCI to RCA '03 2/2 ISR. c. cath 2012: RCA dz, rx medically.  Occluded RCA.  Nonobstructive disease elsewhere   Chronic combined systolic and diastolic CHF (congestive heart failure) (HCC)    a. EF 40-45% in 2014. b. EF reported to be normal in 03/2015 nuc.   CKD (chronic kidney disease), stage III (HCC)    DIVERTICULOSIS, COLON    Epistaxis    Esophageal reflux    History of breast lump/mass excision 05/23/2009   Qualifier: History of  By: Lovell SheehanJenkins MD, Balinda QuailsJohn E    HYPERLIPIDEMIA 03/07/2008   HYPERTENSION 08/03/2007   HYPOTHYROIDISM    Internal hemorrhoids    Interstitial cystitis    LBBB (left bundle branch block)    Orthostatic hypotension 08/2012   a. 08/2012 w/ syncope - meds adjusted.    Right hand fracture     Past Surgical History:   Procedure Laterality Date   ABDOMINAL HYSTERECTOMY     ANKLE RECONSTRUCTION     Left   APPENDECTOMY     BREAST SURGERY     benign mass   CARDIOVERSION N/A 10/20/2013   CARDIOVERSION N/A 04/24/2015   Procedure: CARDIOVERSION;  Surgeon: Lars MassonKatarina H Nelson, MD;  [facility];  Service: Cardiovascular;  Laterality: N/A;   CATARACT EXTRACTION, BILATERAL     CESAREAN SECTION     CHOLECYSTECTOMY     LEFT HEART CATH AND CORONARY ANGIOGRAPHY N/A 09/16/2017   Procedure: LEFT HEART CATH AND CORONARY ANGIOGRAPHY;  Surgeon: SwazilandJordan, Peter M, MD;  [facility];  Service: Cardiovascular;  Laterality: N/A;   TONSILLECTOMY     WRIST RECONSTRUCTION     Right    Outpatient Medications Prior to Visit  Medication Sig Dispense Refill   acetaminophen (TYLENOL) 500 MG tablet Take 1,000 mg by mouth every 8 (eight) hours as needed.     albuterol (VENTOLIN HFA) 108 (90 Base) MCG/ACT inhaler Inhale 2 puffs into the lungs every 6 (six) hours as needed for wheezing or shortness of breath. 8 g 2   apixaban (ELIQUIS) 2.5 MG TABS tablet TAKE 1 TABLET TWICE A DAY 180 tablet 1   ascorbic acid (VITAMIN C) 500  MG tablet Take 500 mg by mouth daily.     benzonatate (TESSALON PERLES) 100 MG capsule Take 1 capsule (100 mg total) by mouth 3 (three) times daily as needed. 30 capsule 0   Cholecalciferol (D3) 50 MCG (2000 UT) TABS Take by mouth daily.      Cyanocobalamin (B-12) 3000 MCG CAPS Take by mouth.     fluticasone (FLONASE) 50 MCG/ACT nasal spray Place 1-2 sprays into both nostrils daily. 16 g 0   furosemide (LASIX) 20 MG tablet TAKE 3 TABLETS BY MOUTH EVERY DAY 270 tablet 1   isosorbide mononitrate (IMDUR) 120 MG 24 hr tablet TAKE 1 TABLET (120 MG TOTAL) BY MOUTH DAILY. NEED OV. 90 tablet 1   KLOR-CON M10 10 MEQ tablet TAKE 1 TABLET BY MOUTH EVERY DAY 90 tablet 0   lansoprazole (PREVACID) 30 MG capsule TAKE 1 CAPSULE TWICE DAILY 180 capsule 1   ONETOUCH VERIO test strip USE TO TEST BLOOD SUGARS  DAILY. DX: E11.9 100 strip 3   pravastatin (PRAVACHOL) 40 MG tablet TAKE 1 TABLET BY MOUTH EVERY DAY 90 tablet 1   SYNTHROID 50 MCG tablet TAKE 1 TABLET DAILY BEFORE BREAKFAST. 90 tablet 3   traMADol (ULTRAM) 50 MG tablet Take 1 tablet (50 mg total) by mouth every 6 (six) hours as needed. 15 tablet 0   valsartan (DIOVAN) 160 MG tablet Take 1 tablet (160 mg total) by mouth daily. 90 tablet 3   predniSONE (DELTASONE) 20 MG tablet Take 1 tablet by mouth daily for 5 days, then 1/2 tablet daily for 2 days 6 tablet 0   loratadine (CLARITIN) 10 MG tablet Take 1 tablet (10 mg total) by mouth daily. 10 tablet 0   nitroGLYCERIN (NITROSTAT) 0.4 MG SL tablet 1 TAB UNDER THE TONGUE EVERY 5 MINS AS NEEDED FOR CHEST PAIN (IF HAVE PAIN AFTER 3 PILLS- CALL 911). 50 tablet 3   No facility-administered medications prior to visit.    Allergies  Allergen Reactions   Codeine Hives and Nausea And Vomiting   Sulfonamide Derivatives Swelling    Turns red        Objective:    Physical Exam Vitals and nursing note reviewed.  Constitutional:      Appearance: Normal appearance.  Cardiovascular:     Rate and Rhythm: Normal rate and regular rhythm.  Pulmonary:     Effort: Pulmonary effort is normal.     Breath sounds: Normal breath sounds.  Musculoskeletal:     Lumbar back: No swelling or tenderness. Decreased range of motion.  Skin:    General: Skin is warm and dry.  Neurological:     Mental Status: She is alert.  Psychiatric:        Mood and Affect: Mood normal.        Behavior: Behavior normal.    BP 124/72   Pulse (!) 103   Temp 98.2 F (36.8 C)   Resp 18   Wt 142 lb 6.4 oz (64.6 kg)   SpO2 97%   BMI 25.23 kg/m  Wt Readings from Last 3 Encounters:  09/27/21 142 lb 6.4 oz (64.6 kg)  09/05/21 145 lb 12.8 oz (66.1 kg)  07/09/21 144 lb 3.2 oz (65.4 kg)       Assessment & Plan:   Problem List Items Addressed This Visit       Nervous and Auditory   Sciatica of left side - Primary     Pt with son who states pt started complaining of pain about  4 days ago, tylenol not helping and it is worse at night. 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 Advised on tylenol, alternating heat & ice, sending a prednisone pack.       Relevant Medications   predniSONE (DELTASONE) 20 MG tablet    Meds ordered this encounter  Medications   predniSONE (DELTASONE) 20 MG tablet    Sig: Take 1 tablet by mouth daily for 5 days, then 1/2 tablet daily for 2 days    Dispense:  6 tablet    Refill:  0    Order Specific Question:   Supervising Provider    Answer:   ANDY, CAMILLE L [[id number]]

## 2021-09-27 NOTE — Assessment & Plan Note (Signed)
Pt with son who states pt started complaining of pain about 4 days ago, tylenol not helping and it is worse at night. 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 Advised on tylenol, alternating heat & ice, sending a prednisone pack.

## 2021-10-10 NOTE — Telephone Encounter (Signed)
Provider not at this office

## 2021-10-11 NOTE — Progress Notes (Incomplete)
Phone [phone] In person visit   Subjective:   Victoria Gomez is a 87 y.o. year old very pleasant female patient who presents for/with See problem oriented charting No chief complaint on file.   This visit occurred during the SARS-CoV-2 public health emergency.  Safety protocols were in place, including screening questions prior to the visit, additional usage of staff PPE, and extensive cleaning of exam room while observing appropriate contact time as indicated for disinfecting solutions.   Past Medical History-  Patient Active Problem List   Diagnosis Date Noted   Strain of lumbar region 09/27/2021   Sciatica of left side 09/27/2021   Ascending aortic aneurysm 07/11/2021   Educated about COVID-19 virus infection 05/03/2020   Stenosis of carotid artery 05/03/2020   Chronic atrial fibrillation ([place]) 05/03/2020   Chronic anticoagulation 07/09/2017   Left groin hernia 06/25/2017   Osteoporosis 06/28/2015   Vitamin B12 deficiency 07/28/2014   CKD (chronic kidney disease), stage III ([place]) 09/22/2013   LBBB (left bundle branch block) 09/22/2013   Controlled type 2 diabetes mellitus with renal manifestation ([place]) 09/22/2013   Internal hemorrhoids    Permanent atrial fibrillation ([place]) 09/21/2013   H/O sinus bradycardia 08/10/2012   Orthostatic hypotension 08/10/2012   Fatigue 04/20/2012   Combined systolic and diastolic congestive heart failure ([place]) 11/17/2011   ARTHRITIS 12/30/2010   Left lumbar radiculopathy 12/11/2010   Interstitial cystitis    Hyperlipidemia associated with type 2 diabetes mellitus ([place]) 03/07/2008   Esophageal reflux 10/18/2007   Hypothyroidism 08/03/2007   Hypertension associated with diabetes ([place]) 08/03/2007   Coronary artery disease involving native coronary artery of native heart with angina pectoris ([employer]) 08/03/2007   History of Barrett's esophagus 08/03/2007    Medications- reviewed and updated Current Outpatient Medications  Medication Sig  Dispense Refill   acetaminophen (TYLENOL) 500 MG tablet Take 1,000 mg by mouth every 8 (eight) hours as needed.     albuterol (VENTOLIN HFA) 108 (90 Base) MCG/ACT inhaler Inhale 2 puffs into the lungs every 6 (six) hours as needed for wheezing or shortness of breath. 8 g 2   apixaban (ELIQUIS) 2.5 MG TABS tablet TAKE 1 TABLET TWICE A DAY 180 tablet 1   ascorbic acid (VITAMIN C) 500 MG tablet Take 500 mg by mouth daily.     benzonatate (TESSALON PERLES) 100 MG capsule Take 1 capsule (100 mg total) by mouth 3 (three) times daily as needed. 30 capsule 0   Cholecalciferol (D3) 50 MCG (2000 UT) TABS Take by mouth daily.      Cyanocobalamin (B-12) 3000 MCG CAPS Take by mouth.     fluticasone (FLONASE) 50 MCG/ACT nasal spray Place 1-2 sprays into both nostrils daily. 16 g 0   furosemide (LASIX) 20 MG tablet TAKE 3 TABLETS BY MOUTH EVERY DAY 270 tablet 1   isosorbide mononitrate (IMDUR) 120 MG 24 hr tablet TAKE 1 TABLET (120 MG TOTAL) BY MOUTH DAILY. NEED OV. 90 tablet 1   KLOR-CON M10 10 MEQ tablet TAKE 1 TABLET BY MOUTH EVERY DAY 90 tablet 0   lansoprazole (PREVACID) 30 MG capsule TAKE 1 CAPSULE TWICE DAILY 180 capsule 1   loratadine (CLARITIN) 10 MG tablet Take 1 tablet (10 mg total) by mouth daily. 10 tablet 0   nitroGLYCERIN (NITROSTAT) 0.4 MG SL tablet 1 TAB UNDER THE TONGUE EVERY 5 MINS AS NEEDED FOR CHEST PAIN (IF HAVE PAIN AFTER 3 PILLS- CALL 911). 50 tablet 3   ONETOUCH VERIO test strip USE TO TEST BLOOD  SUGARS DAILY. DX: E11.9 100 strip 3   pravastatin (PRAVACHOL) 40 MG tablet TAKE 1 TABLET BY MOUTH EVERY DAY 90 tablet 1   predniSONE (DELTASONE) 20 MG tablet Take 1 tablet by mouth daily for 5 days, then 1/2 tablet daily for 2 days 6 tablet 0   SYNTHROID 50 MCG tablet TAKE 1 TABLET DAILY BEFORE BREAKFAST. 90 tablet 3   traMADol (ULTRAM) 50 MG tablet Take 1 tablet (50 mg total) by mouth every 6 (six) hours as needed. 15 tablet 0   valsartan (DIOVAN) 160 MG tablet Take 1 tablet (160 mg total) by  mouth daily. 90 tablet 3   No current facility-administered medications for this visit.     Objective:  There were no vitals taken for this visit. Gen: NAD, resting comfortably CV: RRR no murmurs rubs or gallops Lungs: CTAB no crackles, wheeze, rhonchi Abdomen: soft/nontender/nondistended/normal bowel sounds. No rebound or guarding.  Ext: no edema Skin: warm, dry Neuro: grossly normal, moves all extremities  ***    Assessment and Plan   Medicare AWVS: 08/01/2019***  ***[place] 2021- had lost 87 year old [place] christmas 2020   #Permanent atrial fibrillation S: Compliant with Eliquis 2.5 mg twice a day.  stopped due to bradycardia on heart monitor 2021- Metoprolol 25mg  twice a day for rate control.  A/P: ***   #Diabetes mellitus S: Has been diet controlled. CBGs- *** Exercise and diet- *** Lab Results  Component Value Date   HGBA1C 6.4 04/16/2021   HGBA1C 6.2 (H) 10/16/2020   HGBA1C 6.0 (H) 04/13/2020    A/P: ***  #Combined systolic/diastolic congestive heart failure. S: Compliant with Lasix 60 mg daily. A/P: ***   #CAD with angina status post coronary angioplasty in 2001 and 2003/hyperlipidemia S: Compliant with pravastatin 40 mg daily. Has been chest pain-free on Imdur.  -Not on aspirin due to being on Eliquis A/P:***   #Hypertension/CKD stage III S: Compliant with Lasix 20 mg daily, valsartan 160 mg daily. GFR has been stable in the 35-50 range. Had orthostatic intolerance in the past -Imdur 120 mg in the past  A/P: ***   #Osteoporosis S: Left femoral neck with T score of -2.8 in 2017. Had recommended Fosamax in 2017. She never took this-she feels like she had elevated blood pressure in the past when she was on this so opted not to take-also probably not the best idea with her Barrett's esophagus A/P: ***   #Hypothyroidism S: Compliant with levothyroxine 50 mcg daily Lab Results  Component Value Date   TSH 0.83 04/16/2021    A/P:***   # History  of Barrett's esophagus on EGD 2016. - In 2015 we rediscussed and she opted not to pursue further EGD given her age. She remained on Prevacid 30 mg twice a day -We planned to intermittently check B12***   # in the past back pain- going to return to orthopedics. she was planned for b/l L5 selective nereve root block. she did not complete this she reported in 2021  #[place] LLQ- did not want to pursue surgery at present but will let me know if needed- would only want to do if could do spinal anesthesia. Rough experience with general anesthesia- stated was awake but couldn't move her body  Aneurysm of aorta noted in the chest- they recommend repeat pictures in 1 year  There is a nodule in the lungs that is pretty small- they recommend 6-12 months follow up ***  Health Maintenance Due  Topic Date Due  Zoster Vaccines- Shingrix (1 of 2) Never done   COVID-19 Vaccine (4 - Booster for Pfizer series) 10/28/2020   Recommended follow up: No follow-ups on file. Future Appointments  Date Time Provider Department Center  10/18/2021  1:40 PM Shelva Majestic, MD [facility]    Lab/Order associations: No diagnosis found.  No orders of the defined types were placed in this encounter.  I,Jada Bradford,acting as a scribe for Tana Conch, MD.,have documented all relevant documentation on the behalf of Tana Conch, MD,as directed by  Tana Conch, MD while in the presence of Tana Conch, MD.  ***  Return precautions advised.  Scheryl Marten

## 2021-10-18 ENCOUNTER — Ambulatory Visit: Payer: Medicare Other | Admitting: Family Medicine

## 2021-10-18 DIAGNOSIS — E039 Hypothyroidism, unspecified: Secondary | ICD-10-CM

## 2021-10-18 DIAGNOSIS — E1169 Type 2 diabetes mellitus with other specified complication: Secondary | ICD-10-CM

## 2021-10-18 DIAGNOSIS — E1122 Type 2 diabetes mellitus with diabetic chronic kidney disease: Secondary | ICD-10-CM

## 2021-10-18 DIAGNOSIS — I152 Hypertension secondary to endocrine disorders: Secondary | ICD-10-CM

## 2021-10-18 DIAGNOSIS — I5040 Unspecified combined systolic (congestive) and diastolic (congestive) heart failure: Secondary | ICD-10-CM

## 2021-10-18 DIAGNOSIS — N183 Chronic kidney disease, stage 3 unspecified: Secondary | ICD-10-CM

## 2021-10-18 DIAGNOSIS — I25119 Atherosclerotic heart disease of native coronary artery with unspecified angina pectoris: Secondary | ICD-10-CM

## 2021-10-18 DIAGNOSIS — I4821 Permanent atrial fibrillation: Secondary | ICD-10-CM

## 2021-11-12 ENCOUNTER — Encounter: Payer: Self-pay | Admitting: Family Medicine

## 2021-11-12 ENCOUNTER — Telehealth (INDEPENDENT_AMBULATORY_CARE_PROVIDER_SITE_OTHER): Payer: Medicare Other | Admitting: Family Medicine

## 2021-11-12 VITALS — BP 156/86 | HR 89 | Ht 62.99 in | Wt 140.0 lb

## 2021-11-12 DIAGNOSIS — I10 Essential (primary) hypertension: Secondary | ICD-10-CM | POA: Diagnosis not present

## 2021-11-12 DIAGNOSIS — R509 Fever, unspecified: Secondary | ICD-10-CM

## 2021-11-12 DIAGNOSIS — R051 Acute cough: Secondary | ICD-10-CM

## 2021-11-12 MED ORDER — BENZONATATE 100 MG PO CAPS: 100.0000 mg | ORAL_CAPSULE | Freq: Three times a day (TID) | ORAL | 0 refills | Status: AC | PRN

## 2021-11-12 NOTE — Progress Notes (Signed)
Phone [phone] Virtual visit via phonenote   Subjective:   Chief Complaint  Patient presents with   cold    Pt states she has yellow mucous, sinus congestion, sinus pressure and has had fever of 101 day before yesterday. Pt has taken otc meds.    This visit type was conducted due to national recommendations for restrictions regarding the COVID-19 Pandemic (e.g. social distancing).  This format is felt to be most appropriate for this patient at this time balancing risks to patient and risks to population by having him in for in person visit.  All issues noted in this document were discussed and addressed.  No physical exam was performed (except for noted visual exam or audio findings with Telehealth visits).  The patient has consented to conduct a Telehealth visit and understands insurance will be billed.   Our team/I connected with [name] at  1:00 PM EST by phone (patient did not have equipment for webex) and verified that I am speaking with the correct person using two identifiers.  Location patient: [facility] Location provider: [facility], office Persons participating in the virtual visit:  patient  Time on phone: 13 minutes  Our team/I discussed the limitations of evaluation and management by telemedicine and the availability of in person appointments. In light of current covid-19 pandemic, patient also understands that we are trying to protect them by minimizing in office contact if at all possible.  The patient expressed consent for telemedicine visit and agreed to proceed. Patient understands insurance will be billed.   Past Medical History-  Patient Active Problem List   Diagnosis Date Noted   Ascending aortic aneurysm 07/11/2021    Priority: High   Controlled type 2 diabetes mellitus with renal manifestation ([place]) 09/22/2013    Priority: High   Permanent atrial fibrillation ([place]) 09/21/2013    Priority: High   H/O sinus bradycardia 08/10/2012    Priority: High    Combined systolic and diastolic congestive heart failure ([place]) 11/17/2011    Priority: High   Coronary artery disease involving native coronary artery of native heart with angina pectoris ([place]) 08/03/2007    Priority: High   Osteoporosis 06/28/2015    Priority: Medium    Vitamin B12 deficiency 07/28/2014    Priority: Medium    CKD (chronic kidney disease), stage III ([place]) 09/22/2013    Priority: Medium    LBBB (left bundle branch block) 09/22/2013    Priority: Medium    Interstitial cystitis     Priority: Medium    Hyperlipidemia associated with type 2 diabetes mellitus ([place]) 03/07/2008    Priority: Medium    Hypothyroidism 08/03/2007    Priority: Medium    Hypertension associated with diabetes ([place]) 08/03/2007    Priority: Medium    Chronic anticoagulation 07/09/2017    Priority: Low   Left groin hernia 06/25/2017    Priority: Low   Internal hemorrhoids     Priority: Low   Orthostatic hypotension 08/10/2012    Priority: Low   Fatigue 04/20/2012    Priority: Low   ARTHRITIS 12/30/2010    Priority: Low   Left lumbar radiculopathy 12/11/2010    Priority: Low   Esophageal reflux 10/18/2007    Priority: Low   History of Barrett's esophagus 08/03/2007    Priority: Low   Strain of lumbar region 09/27/2021   Sciatica of left side 09/27/2021   Educated about COVID-19 virus infection 05/03/2020   Stenosis of carotid artery 05/03/2020   Chronic atrial fibrillation ([place])  05/03/2020    Medications- reviewed and updated Current Outpatient Medications  Medication Sig Dispense Refill   acetaminophen (TYLENOL) 500 MG tablet Take 1,000 mg by mouth every 8 (eight) hours as needed.     albuterol (VENTOLIN HFA) 108 (90 Base) MCG/ACT inhaler Inhale 2 puffs into the lungs every 6 (six) hours as needed for wheezing or shortness of breath. 8 g 2   apixaban (ELIQUIS) 2.5 MG TABS tablet TAKE 1 TABLET TWICE A DAY 180 tablet 1   ascorbic acid (VITAMIN C) 500 MG tablet Take 500 mg by mouth  daily.     Cholecalciferol (D3) 50 MCG (2000 UT) TABS Take by mouth daily.      Cyanocobalamin (B-12) 3000 MCG CAPS Take by mouth.     fluticasone (FLONASE) 50 MCG/ACT nasal spray Place 1-2 sprays into both nostrils daily. 16 g 0   furosemide (LASIX) 20 MG tablet TAKE 3 TABLETS BY MOUTH EVERY DAY 270 tablet 1   isosorbide mononitrate (IMDUR) 120 MG 24 hr tablet TAKE 1 TABLET (120 MG TOTAL) BY MOUTH DAILY. NEED OV. 90 tablet 1   KLOR-CON M10 10 MEQ tablet TAKE 1 TABLET BY MOUTH EVERY DAY 90 tablet 0   lansoprazole (PREVACID) 30 MG capsule TAKE 1 CAPSULE TWICE DAILY 180 capsule 1   loratadine (CLARITIN) 10 MG tablet Take 1 tablet (10 mg total) by mouth daily. 10 tablet 0   nitroGLYCERIN (NITROSTAT) 0.4 MG SL tablet 1 TAB UNDER THE TONGUE EVERY 5 MINS AS NEEDED FOR CHEST PAIN (IF HAVE PAIN AFTER 3 PILLS- CALL 911). 50 tablet 3   ONETOUCH VERIO test strip USE TO TEST BLOOD SUGARS DAILY. DX: E11.9 100 strip 3   pravastatin (PRAVACHOL) 40 MG tablet TAKE 1 TABLET BY MOUTH EVERY DAY 90 tablet 1   SYNTHROID 50 MCG tablet TAKE 1 TABLET DAILY BEFORE BREAKFAST. 90 tablet 3   traMADol (ULTRAM) 50 MG tablet Take 1 tablet (50 mg total) by mouth every 6 (six) hours as needed. 15 tablet 0   valsartan (DIOVAN) 160 MG tablet Take 1 tablet (160 mg total) by mouth daily. 90 tablet 3   benzonatate (TESSALON PERLES) 100 MG capsule Take 1 capsule (100 mg total) by mouth 3 (three) times daily as needed. 30 capsule 0   No current facility-administered medications for this visit.     Objective:  BP (!) 156/86    Pulse 89    Ht 5' 2.99" (1.6 m)    Wt 140 lb (63.5 kg)    BMI 24.81 kg/m  self reported vitals  Nonlabored voice, normal speech      Assessment and Plan   # cough/congestion/fever S:  Pt states she has yellow/green mucous, sinus congestion, sinus pressure and has had fever of 101 on new years day. All symptoms started on new years day. No shortness of breath noted. Some sore throat mild. Headaches/sinus  pressure. Very runny nose.   -has not taken home covid test and not sure about New Year's Day - Pt has taken otc meds- mucinex and tylenol - no fever today but did take tylenol when woke up this morning.  A/P: 88 year old female with cough/congestion/fever.  Strongly recommended she come by for COVID PCR and flu point-of-care testing. At minimum could do home covid test. Patient will consider these but has issues with transport (she knows to call if she can get by office for these).  She does want Tessalon Perles called in which have helped her in the past with cough-she  reports her son can pick these up after he gets off of work-hopefully he can pick up a COVID test as well. For now she wants to monitor at home unless she has new or worsening symptoms  BP slightly high but not feeling well today- she is going to monitor with hope this trends back down with more time- continue current meds for now  Recommended follow up:  Future Appointments  Date Time Provider [place]  11/21/2021  3:30 PM Levin Erp, PA [facility]    Lab/Order associations:   ICD-10-CM   1. Acute cough  R05.1 benzonatate (TESSALON PERLES) 100 MG capsule    2. Fever, unspecified fever cause  R50.9     3. Essential hypertension  I10       Meds ordered this encounter  Medications   benzonatate (TESSALON PERLES) 100 MG capsule    Sig: Take 1 capsule (100 mg total) by mouth 3 (three) times daily as needed.    Dispense:  30 capsule    Refill:  0   Return precautions advised.  Garret Reddish, MD

## 2021-11-12 NOTE — Patient Instructions (Signed)
Health Maintenance Due  Topic Date Due   Zoster Vaccines- Shingrix (1 of 2) Never done   COVID-19 Vaccine (4 - Booster for Pfizer series) 10/28/2020   FOOT EXAM  10/16/2021   HEMOGLOBIN A1C  10/16/2021    Recommended follow up: No follow-ups on file.

## 2021-11-13 ENCOUNTER — Telehealth: Payer: Self-pay | Admitting: Family Medicine

## 2021-11-13 NOTE — Telephone Encounter (Signed)
Pt called because her son picked up her medications from the pharmacy and there were 2,  benzonatate (TESSALON PERLES) 100 MG capsule.  This was from Dr Yong Channel and she was expecting it isosorbide mononitrate (IMDUR) 120 MG 24 hr tablet.  She was not expecting this one and it was from Dr Percival Spanish and I advised her to call their office and gave them the number

## 2021-11-21 ENCOUNTER — Ambulatory Visit: Payer: Medicare Other | Admitting: Physician Assistant

## 2021-12-03 ENCOUNTER — Emergency Department (HOSPITAL_COMMUNITY): Payer: Medicare Other

## 2021-12-03 ENCOUNTER — Emergency Department (HOSPITAL_COMMUNITY)
Admission: EM | Admit: 2021-12-03 | Discharge: 2021-12-03 | Disposition: A | Discharge status: Home / Self Care | Payer: Medicare Other | Attending: Emergency Medicine | Admitting: Emergency Medicine

## 2021-12-03 DIAGNOSIS — Z9049 Acquired absence of other specified parts of digestive tract: Secondary | ICD-10-CM | POA: Diagnosis not present

## 2021-12-03 DIAGNOSIS — T1490XA Injury, unspecified, initial encounter: Secondary | ICD-10-CM

## 2021-12-03 DIAGNOSIS — W19XXXA Unspecified fall, initial encounter: Secondary | ICD-10-CM | POA: Diagnosis not present

## 2021-12-03 DIAGNOSIS — M25572 Pain in left ankle and joints of left foot: Secondary | ICD-10-CM | POA: Insufficient documentation

## 2021-12-03 DIAGNOSIS — S0003XA Contusion of scalp, initial encounter: Secondary | ICD-10-CM | POA: Diagnosis not present

## 2021-12-03 DIAGNOSIS — K5730 Diverticulosis of large intestine without perforation or abscess without bleeding: Secondary | ICD-10-CM | POA: Diagnosis not present

## 2021-12-03 DIAGNOSIS — M4316 Spondylolisthesis, lumbar region: Secondary | ICD-10-CM | POA: Diagnosis not present

## 2021-12-03 DIAGNOSIS — R0902 Hypoxemia: Secondary | ICD-10-CM | POA: Diagnosis not present

## 2021-12-03 DIAGNOSIS — I10 Essential (primary) hypertension: Secondary | ICD-10-CM | POA: Diagnosis not present

## 2021-12-03 DIAGNOSIS — Z743 Need for continuous supervision: Secondary | ICD-10-CM | POA: Diagnosis not present

## 2021-12-03 DIAGNOSIS — Y92009 Unspecified place in unspecified non-institutional (private) residence as the place of occurrence of the external cause: Secondary | ICD-10-CM | POA: Insufficient documentation

## 2021-12-03 DIAGNOSIS — R58 Hemorrhage, not elsewhere classified: Secondary | ICD-10-CM | POA: Diagnosis not present

## 2021-12-03 DIAGNOSIS — Z043 Encounter for examination and observation following other accident: Secondary | ICD-10-CM | POA: Diagnosis not present

## 2021-12-03 DIAGNOSIS — Z20822 Contact with and (suspected) exposure to covid-19: Secondary | ICD-10-CM | POA: Insufficient documentation

## 2021-12-03 DIAGNOSIS — I2510 Atherosclerotic heart disease of native coronary artery without angina pectoris: Secondary | ICD-10-CM | POA: Diagnosis not present

## 2021-12-03 DIAGNOSIS — I700 Atherosclerosis of aorta: Secondary | ICD-10-CM | POA: Diagnosis not present

## 2021-12-03 DIAGNOSIS — S3991XA Unspecified injury of abdomen, initial encounter: Secondary | ICD-10-CM | POA: Diagnosis not present

## 2021-12-03 DIAGNOSIS — Z23 Encounter for immunization: Secondary | ICD-10-CM | POA: Insufficient documentation

## 2021-12-03 DIAGNOSIS — R0789 Other chest pain: Secondary | ICD-10-CM | POA: Diagnosis not present

## 2021-12-03 DIAGNOSIS — Z79899 Other long term (current) drug therapy: Secondary | ICD-10-CM | POA: Insufficient documentation

## 2021-12-03 DIAGNOSIS — S6992XA Unspecified injury of left wrist, hand and finger(s), initial encounter: Secondary | ICD-10-CM | POA: Diagnosis present

## 2021-12-03 DIAGNOSIS — S01112A Laceration without foreign body of left eyelid and periocular area, initial encounter: Secondary | ICD-10-CM | POA: Diagnosis not present

## 2021-12-03 DIAGNOSIS — Z7901 Long term (current) use of anticoagulants: Secondary | ICD-10-CM | POA: Diagnosis not present

## 2021-12-03 DIAGNOSIS — Z7951 Long term (current) use of inhaled steroids: Secondary | ICD-10-CM | POA: Diagnosis not present

## 2021-12-03 DIAGNOSIS — S0181XA Laceration without foreign body of other part of head, initial encounter: Secondary | ICD-10-CM | POA: Insufficient documentation

## 2021-12-03 DIAGNOSIS — S52592A Other fractures of lower end of left radius, initial encounter for closed fracture: Secondary | ICD-10-CM | POA: Diagnosis not present

## 2021-12-03 DIAGNOSIS — S52352A Displaced comminuted fracture of shaft of radius, left arm, initial encounter for closed fracture: Secondary | ICD-10-CM | POA: Diagnosis not present

## 2021-12-03 DIAGNOSIS — I517 Cardiomegaly: Secondary | ICD-10-CM | POA: Diagnosis not present

## 2021-12-03 DIAGNOSIS — S0012XA Contusion of left eyelid and periocular area, initial encounter: Secondary | ICD-10-CM | POA: Insufficient documentation

## 2021-12-03 DIAGNOSIS — S299XXA Unspecified injury of thorax, initial encounter: Secondary | ICD-10-CM | POA: Diagnosis not present

## 2021-12-03 DIAGNOSIS — S62102A Fracture of unspecified carpal bone, left wrist, initial encounter for closed fracture: Secondary | ICD-10-CM

## 2021-12-03 DIAGNOSIS — S52502A Unspecified fracture of the lower end of left radius, initial encounter for closed fracture: Secondary | ICD-10-CM | POA: Diagnosis not present

## 2021-12-03 DIAGNOSIS — S99912A Unspecified injury of left ankle, initial encounter: Secondary | ICD-10-CM | POA: Diagnosis not present

## 2021-12-03 DIAGNOSIS — M4312 Spondylolisthesis, cervical region: Secondary | ICD-10-CM | POA: Diagnosis not present

## 2021-12-03 LAB — RESP PANEL BY RT-PCR (FLU A&B, COVID) ARPGX2
Influenza A by PCR: NEGATIVE
Influenza B by PCR: NEGATIVE
SARS Coronavirus 2 by RT PCR: NEGATIVE

## 2021-12-03 LAB — URINALYSIS, ROUTINE W REFLEX MICROSCOPIC
Bilirubin Urine: NEGATIVE
Glucose, UA: NEGATIVE mg/dL
Hgb urine dipstick: NEGATIVE
Ketones, ur: 5 mg/dL — AB
Leukocytes,Ua: NEGATIVE
Nitrite: NEGATIVE
Protein, ur: 100 mg/dL — AB
Specific Gravity, Urine: 1.021 (ref 1.005–1.030)
pH: 9 — ABNORMAL HIGH (ref 5.0–8.0)

## 2021-12-03 LAB — COMPREHENSIVE METABOLIC PANEL
ALT: 11 U/L (ref 0–44)
AST: 19 U/L (ref 15–41)
Albumin: 3.8 g/dL (ref 3.5–5.0)
Alkaline Phosphatase: 58 U/L (ref 38–126)
Anion gap: 13 (ref 5–15)
BUN: 18 mg/dL (ref 8–23)
CO2: 25 mmol/L (ref 22–32)
Calcium: 9.3 mg/dL (ref 8.9–10.3)
Chloride: 103 mmol/L (ref 98–111)
Creatinine, Ser: 1.07 mg/dL — ABNORMAL HIGH (ref 0.44–1.00)
GFR, Estimated: 48 mL/min — ABNORMAL LOW (ref >60)
Glucose, Bld: 103 mg/dL — ABNORMAL HIGH (ref 70–99)
Potassium: 3.7 mmol/L (ref 3.5–5.1)
Sodium: 141 mmol/L (ref 135–145)
Total Bilirubin: 0.8 mg/dL (ref 0.3–1.2)
Total Protein: 7.2 g/dL (ref 6.5–8.1)

## 2021-12-03 LAB — I-STAT CHEM 8, ED
BUN: 20 mg/dL (ref 8–23)
Calcium, Ion: 1.11 mmol/L — ABNORMAL LOW (ref 1.15–1.40)
Chloride: 105 mmol/L (ref 98–111)
Creatinine, Ser: 1.1 mg/dL — ABNORMAL HIGH (ref 0.44–1.00)
Glucose, Bld: 104 mg/dL — ABNORMAL HIGH (ref 70–99)
HCT: 38 % (ref 36.0–46.0)
Hemoglobin: 12.9 g/dL (ref 12.0–15.0)
Potassium: 3.7 mmol/L (ref 3.5–5.1)
Sodium: 142 mmol/L (ref 135–145)
TCO2: 28 mmol/L (ref 22–32)

## 2021-12-03 LAB — CBC WITH DIFFERENTIAL/PLATELET
Abs Immature Granulocytes: 0.04 10*3/uL (ref 0.00–0.07)
Basophils Absolute: 0 10*3/uL (ref 0.0–0.1)
Basophils Relative: 1 %
Eosinophils Absolute: 0 10*3/uL (ref 0.0–0.5)
Eosinophils Relative: 1 %
HCT: 38.8 % (ref 36.0–46.0)
Hemoglobin: 12.3 g/dL (ref 12.0–15.0)
Immature Granulocytes: 1 %
Lymphocytes Relative: 32 %
Lymphs Abs: 2 10*3/uL (ref 0.7–4.0)
MCH: 29.6 pg (ref 26.0–34.0)
MCHC: 31.7 g/dL (ref 30.0–36.0)
MCV: 93.3 fL (ref 80.0–100.0)
Monocytes Absolute: 0.4 10*3/uL (ref 0.1–1.0)
Monocytes Relative: 6 %
Neutro Abs: 3.9 10*3/uL (ref 1.7–7.7)
Neutrophils Relative %: 59 %
Platelets: 212 10*3/uL (ref 150–400)
RBC: 4.16 MIL/uL (ref 3.87–5.11)
RDW: 13.2 % (ref 11.5–15.5)
WBC: 6.4 10*3/uL (ref 4.0–10.5)
nRBC: 0 % (ref 0.0–0.2)

## 2021-12-03 LAB — PROTIME-INR
INR: 1.1 (ref 0.8–1.2)
Prothrombin Time: 13.8 seconds (ref 11.4–15.2)

## 2021-12-03 MED ORDER — OXYCODONE HCL 5 MG PO TABS: 5.0000 mg | ORAL_TABLET | Freq: Three times a day (TID) | ORAL | 0 refills | Status: AC | PRN

## 2021-12-03 MED ORDER — IOHEXOL 300 MG/ML  SOLN
100.0000 mL | Freq: Once | INTRAMUSCULAR | Status: AC | PRN
  Administered 2021-12-03: 17:00:00 100 mL via INTRAVENOUS

## 2021-12-03 MED ORDER — FENTANYL CITRATE PF 50 MCG/ML IJ SOSY
100.0000 ug | PREFILLED_SYRINGE | Freq: Once | INTRAMUSCULAR | Status: AC
  Administered 2021-12-03: 18:00:00 100 ug via INTRAVENOUS
  Filled 2021-12-03: qty 2

## 2021-12-03 MED ORDER — ONDANSETRON HCL 4 MG/2ML IJ SOLN
4.0000 mg | Freq: Once | INTRAMUSCULAR | Status: AC
  Administered 2021-12-03: 17:00:00 4 mg via INTRAVENOUS
  Filled 2021-12-03: qty 2

## 2021-12-03 MED ORDER — ONDANSETRON HCL 4 MG/2ML IJ SOLN
4.0000 mg | Freq: Once | INTRAMUSCULAR | Status: AC
Start: 2021-12-03 — End: 2021-12-03
  Administered 2021-12-03: 19:00:00 4 mg via INTRAVENOUS
  Filled 2021-12-03: qty 2

## 2021-12-03 MED ORDER — ONDANSETRON HCL 4 MG PO TABS: 4.0000 mg | ORAL_TABLET | Freq: Four times a day (QID) | ORAL | 0 refills | Status: AC

## 2021-12-03 MED ORDER — FENTANYL CITRATE PF 50 MCG/ML IJ SOSY
100.0000 ug | PREFILLED_SYRINGE | INTRAMUSCULAR | Status: DC | PRN
  Administered 2021-12-03: 16:00:00 100 ug via INTRAVENOUS
  Filled 2021-12-03: qty 2

## 2021-12-03 MED ORDER — LIDOCAINE HCL (PF) 1 % IJ SOLN
5.0000 mL | Freq: Once | INTRAMUSCULAR | Status: AC
  Administered 2021-12-03: 18:00:00 5 mL
  Filled 2021-12-03: qty 5

## 2021-12-03 MED ORDER — TETANUS-DIPHTH-ACELL PERTUSSIS 5-2.5-18.5 LF-MCG/0.5 IM SUSY
0.5000 mL | PREFILLED_SYRINGE | Freq: Once | INTRAMUSCULAR | Status: AC
  Administered 2021-12-03: 18:00:00 0.5 mL via INTRAMUSCULAR
  Filled 2021-12-03: qty 0.5

## 2021-12-03 NOTE — ED Notes (Signed)
Patient transported from CT to xray

## 2021-12-03 NOTE — ED Provider Notes (Signed)
Assumed care of patient at 4 PM.  Ground-level fall.  On blood thinners.  CT scans have been ordered including extremity x-rays.  Deformity to left wrist.  Per my evaluation of CT scans and x-rays only significant injury is left wrist fracture with comminuted fracture with displacement.  Hematoma block was performed and wrist was reduced by myself.  Repeat x-rays per my evaluation show improved alignment.  Neurovascularly intact before and after the exam.  She had a laceration to the left side of her eyebrow that was repaired by myself with Dermabond and Steri-Strips.  Otherwise there are no other significant injuries.  Family is at the bedside.  They live with her.  She is ambulatory.  She is right-hand dominant.  She was able to ambulate here without any issues.  They feel safe with taking her home.  She was written for oxycodone and Zofran.  She felt much better after some IV fentanyl here in the ED.  She is placed in a splint with a sling.  We will have her follow-up with hand surgery.  Discharged in good condition.  Wound care instructions given.  This chart was dictated using voice recognition software.  Despite best efforts to proofread,  errors can occur which can change the documentation meaning.   [name].Laceration Repair  Date/Time: 12/03/2021 5:59 PM Performed by: Virgina Norfolk, DO Authorized by: Virgina Norfolk, DO   Consent:    Consent obtained:  Verbal   Consent given by:  Patient   Risks, benefits, and alternatives were discussed: yes     Risks discussed:  Infection, need for additional repair, nerve damage, pain, poor cosmetic result, poor wound healing, retained foreign body, tendon damage and vascular damage Universal protocol:    Patient identity confirmed:  Verbally with patient Anesthesia:    Anesthesia method:  None Laceration details:    Location:  Face   Face location:  L eyebrow   Length (cm):  3   Depth (mm):  1 Pre-procedure details:    Preparation:  Patient was  prepped and draped in usual sterile fashion Exploration:    Imaging outcome: foreign body not noted     Wound extent: no areolar tissue violation noted, no fascia violation noted, no foreign bodies/material noted, no muscle damage noted, no nerve damage noted, no tendon damage noted, no underlying fracture noted and no vascular damage noted     Contaminated: no   Treatment:    Area cleansed with:  Shur-Clens   Amount of cleaning:  Standard   Irrigation method:  Pressure wash   Visualized foreign bodies/material removed: no     Scar revision: no   Skin repair:    Repair method:  Steri-Strips and tissue adhesive   Number of Steri-Strips:  5 Approximation:    Approximation:  Close Repair type:    Repair type:  Simple Post-procedure details:    Dressing:  Open (no dressing)   Procedure completion:  Tolerated .Ortho Injury Treatment  Date/Time: 12/03/2021 6:00 PM Performed by: Virgina Norfolk, DO Authorized by: Virgina Norfolk, DO   Consent:    Consent obtained:  Written   Consent given by:  Guardian and patient   Risks discussed:  Fracture   Alternatives discussed:  No treatment, immobilization and referralInjury location: wrist Location details: left wrist Injury type: fracture Fracture type: distal radius Pre-procedure neurovascular assessment: neurovascularly intact Pre-procedure distal perfusion: normal Pre-procedure neurological function: normal Pre-procedure range of motion: reduced Anesthesia: local infiltration  Anesthesia: Local anesthesia used: yes Local Anesthetic:  lidocaine 1% without epinephrine Anesthetic total: 5 mL  Patient sedated: NoManipulation performed: yes Skin traction used: yes Skeletal traction used: yes Reduction successful: yes X-ray confirmed reduction: yes Immobilization: splint Splint type: sugar tong Splint Applied by: ED Provider Supplies used: Ortho-Glass Post-procedure neurovascular assessment: post-procedure neurovascularly  intact Post-procedure distal perfusion: normal Post-procedure neurological function: normal Post-procedure range of motion comment: splinted      Virgina Norfolk, DO 12/03/21 1929

## 2021-12-03 NOTE — ED Triage Notes (Signed)
Pt to ED via EMS from home for witnessed fall on eliquis. EMS reports crepitus to left chest, deformity to left wrist, bleeding to left eye. Patient alert and oriented on arrival to ED, MD present at bedside on arrival. 100 mcg fentanyl given PTA. Hx CHF, HTN, CKD.  EMS v/s: 180/100 BP 85 HR afib

## 2021-12-03 NOTE — Discharge Instructions (Addendum)
Recommend 1000 mg of Tylenol every 6 hours as needed for pain.  You have been prescribed Roxicodone which is a narcotic pain medicine.  Please be careful while using this medication as it is sedating.  Do not get your splint wet.  Keep your laceration site clean and dry for the next 48 to 72 hours.  This was treated with skin glue which will fall off on its own.  Follow-up with hand surgeon.

## 2021-12-03 NOTE — Progress Notes (Signed)
Orthopedic Tech Progress Note Patient Details:  Victoria Gomez 20-Aug-1925 [id number]  Ortho Devices Type of Ortho Device: Sugartong splint, Arm sling Ortho Device/Splint Location: LUE Ortho Device/Splint Interventions: Ordered, Application   Post Interventions Patient Tolerated: Well  Lovett Calender 12/03/2021, 6:43 PM

## 2021-12-03 NOTE — ED Notes (Signed)
Patient transported to CT 

## 2021-12-03 NOTE — ED Provider Notes (Signed)
[facility] EMERGENCY DEPARTMENT Provider Note   CSN: [id number] Arrival date & time: 12/03/21  1521     History  Chief Complaint  Patient presents with   Victoria Gomez    JOYCELYNN PHU is a 88 y.o. female.   Fall Associated symptoms include chest pain (Left anterior chest wall). Pertinent negatives include no abdominal pain, no headaches and no shortness of breath. Patient is a highly functional 88 year old female who presents after ground-level fall at home.  Patient states that she rolled her ankle which caused her to fall onto her left side.  She is on Eliquis.  Her son, who was at home with her, called EMS.  EMS noted deformity to her left wrist, hematoma and laceration above left eye, tenderness and crepitus on palpation of left chest, and pain and tenderness to left ankle.  Patient denies any other areas of pain at this time.  History per son: Patient was wearing shoes at home which caused her to twist on the heel of her left shoe which caused the fall.  She did not have a loss of consciousness.  Following the fall, she screamed out in pain.  Although he did not witness the fall, he was closed by.  He states that there was a loud thud when she did fall and he realized that she had hit the ground hard.  He immediately called EMS.  At baseline, patient is highly functional.  She is able to perform all her activities of daily living, including paying bills.  Patient lives at home with her son, who assists with physical tasks and meals.  Prior to the fall, patient was in her normal state of health.     Home Medications Prior to Admission medications   Medication Sig Start Date End Date Taking? Authorizing Provider  ondansetron (ZOFRAN) 4 MG tablet Take 1 tablet (4 mg total) by mouth every 6 (six) hours. 12/03/21  Yes Curatolo, Adam, DO  oxyCODONE (ROXICODONE) 5 MG immediate release tablet Take 1 tablet (5 mg total) by mouth every 8 (eight) hours as needed for up to 10 doses  for severe pain. 12/03/21  Yes Curatolo, Adam, DO  acetaminophen (TYLENOL) 500 MG tablet Take 1,000 mg by mouth every 8 (eight) hours as needed.    [provider]  albuterol (VENTOLIN HFA) 108 (90 Base) MCG/ACT inhaler Inhale 2 puffs into the lungs every 6 (six) hours as needed for wheezing or shortness of breath. 06/26/21   Nche, Charlene Brooke, NP  apixaban (ELIQUIS) 2.5 MG TABS tablet TAKE 1 TABLET TWICE A DAY 05/07/21   Minus Breeding, MD  ascorbic acid (VITAMIN C) 500 MG tablet Take 500 mg by mouth daily.    [provider]  benzonatate (TESSALON PERLES) 100 MG capsule Take 1 capsule (100 mg total) by mouth 3 (three) times daily as needed. 11/12/21   Marin Olp, MD  Cholecalciferol (D3) 50 MCG (2000 UT) TABS Take by mouth daily.     [provider]  Cyanocobalamin (B-12) 3000 MCG CAPS Take by mouth.    [provider]  fluticasone (FLONASE) 50 MCG/ACT nasal spray Place 1-2 sprays into both nostrils daily. 11/16/20   Wieters, Hallie C, PA-C  furosemide (LASIX) 20 MG tablet TAKE 3 TABLETS BY MOUTH EVERY DAY 06/27/21   Marin Olp, MD  isosorbide mononitrate (IMDUR) 120 MG 24 hr tablet TAKE 1 TABLET (120 MG TOTAL) BY MOUTH DAILY. NEED OV. 09/16/21   Minus Breeding, [place]  M10 10 MEQ tablet TAKE 1 TABLET BY MOUTH EVERY DAY 09/16/21   Marin Olp, MD  lansoprazole (PREVACID) 30 MG capsule TAKE 1 CAPSULE TWICE DAILY 05/03/21   Minus Breeding, MD  loratadine (CLARITIN) 10 MG tablet Take 1 tablet (10 mg total) by mouth daily. 11/16/20   Wieters, Hallie C, PA-C  nitroGLYCERIN (NITROSTAT) 0.4 MG SL tablet 1 TAB UNDER THE TONGUE EVERY 5 MINS AS NEEDED FOR CHEST PAIN (IF HAVE PAIN AFTER 3 PILLS- CALL 911). 04/19/21   Marin Olp, MD  [facility] VERIO test strip USE TO TEST BLOOD SUGARS DAILY. DX: E11.9 01/22/21   Marin Olp, MD  pravastatin (PRAVACHOL) 40 MG tablet TAKE 1 TABLET BY MOUTH EVERY DAY 01/28/21   Marin Olp, MD  SYNTHROID 50 MCG  tablet TAKE 1 TABLET DAILY BEFORE BREAKFAST. 05/05/21   Marin Olp, MD  traMADol (ULTRAM) 50 MG tablet Take 1 tablet (50 mg total) by mouth every 6 (six) hours as needed. 10/03/20   Robyn Haber, MD  valsartan (DIOVAN) 160 MG tablet Take 1 tablet (160 mg total) by mouth daily. 01/24/21   Marin Olp, MD      Allergies    Codeine and Sulfonamide derivatives    Review of Systems   Review of Systems  Constitutional:  Negative for activity change, appetite change, chills, fatigue and fever.  HENT:  Positive for facial swelling. Negative for congestion, trouble swallowing and voice change.   Eyes:  Negative for photophobia, pain and visual disturbance.  Respiratory:  Negative for chest tightness, shortness of breath and stridor.   Cardiovascular:  Positive for chest pain (Left anterior chest wall).  Gastrointestinal:  Negative for abdominal distention, abdominal pain, diarrhea, nausea and vomiting.  Genitourinary:  Negative for dysuria, flank pain and pelvic pain.  Musculoskeletal:  Positive for arthralgias (Left wrist, left ankle) and joint swelling. Negative for back pain, myalgias, neck pain and neck stiffness.  Skin:  Positive for wound.  Neurological:  Negative for dizziness, syncope, speech difficulty, weakness, light-headedness, numbness and headaches.  Hematological:  Bruises/bleeds easily (On Eliquis).  Psychiatric/Behavioral:  Negative for confusion and decreased concentration.    Physical Exam Updated Vital Signs BP (!) 156/91    Pulse 85    Temp (!) 97.5 F (36.4 C) (Oral)    Resp 20    Ht 5\' 3"  (1.6 m)    Wt 64.9 kg    SpO2 94%    BMI 25.33 kg/m  Physical Exam Vitals and nursing note reviewed.  Constitutional:      General: She is not in acute distress.    Appearance: Normal appearance. She is well-developed and normal weight. She is not toxic-appearing or diaphoretic.  HENT:     Head: Normocephalic.     Comments: Hematoma with small overlying laceration on  left supraorbital area    Right Ear: External ear normal.     Left Ear: External ear normal.     Nose: Nose normal. No congestion.     Mouth/Throat:     Mouth: Mucous membranes are moist.     Pharynx: Oropharynx is clear.     Comments: No trismus, no malocclusion Eyes:     Conjunctiva/sclera: Conjunctivae normal.  Cardiovascular:     Rate and Rhythm: Normal rate and regular rhythm.     Heart sounds: No murmur heard. Pulmonary:     Effort: Pulmonary effort is normal. No respiratory distress.     Breath sounds: Normal breath sounds. No wheezing  or rales.  Chest:     Chest wall: Tenderness present.  Abdominal:     General: Abdomen is flat.     Palpations: Abdomen is soft.     Tenderness: There is no abdominal tenderness.  Musculoskeletal:        General: Swelling, tenderness, deformity and signs of injury present.     Cervical back: Normal range of motion and neck supple.     Right lower leg: No edema.     Left lower leg: No edema.  Skin:    General: Skin is warm and dry.     Capillary Refill: Capillary refill takes less than 2 seconds.     Coloration: Skin is not jaundiced or pale.  Neurological:     General: No focal deficit present.     Mental Status: She is alert.     Cranial Nerves: No cranial nerve deficit.     Sensory: No sensory deficit.     Motor: No weakness.     Coordination: Coordination normal.  Psychiatric:        Mood and Affect: Mood normal.        Behavior: Behavior normal.        Thought Content: Thought content normal.        Judgment: Judgment normal.    ED Results / Procedures / Treatments   Labs (all labs ordered are listed, but only abnormal results are displayed) Labs Reviewed  COMPREHENSIVE METABOLIC PANEL - Abnormal; Notable for the following components:      Result Value   Glucose, Bld 103 (*)    Creatinine, Ser 1.07 (*)    GFR, Estimated 48 (*)    All other components within normal limits  URINALYSIS, ROUTINE W REFLEX MICROSCOPIC -  Abnormal; Notable for the following components:   APPearance HAZY (*)    pH 9.0 (*)    Ketones, ur 5 (*)    Protein, ur 100 (*)    Bacteria, UA FEW (*)    All other components within normal limits  I-STAT CHEM 8, ED - Abnormal; Notable for the following components:   Creatinine, Ser 1.10 (*)    Glucose, Bld 104 (*)    Calcium, Ion 1.11 (*)    All other components within normal limits  RESP PANEL BY RT-PCR (FLU A&B, COVID) [id number]  PROTIME-INR  CBC WITH DIFFERENTIAL/PLATELET    EKG EKG Interpretation  Date/Time:  Tuesday December 03 2021 17:49:51 EST Ventricular Rate:  92 PR Interval:    QRS Duration: 138 QT Interval:  435 QTC Calculation: 539 R Axis:   264 Text Interpretation: Right and left arm electrode reversal, interpretation assumes no reversal Atrial flutter Nonspecific IVCD with LAD Inferior infarct, old Anterior infarct, old Confirmed by Godfrey Pick [phone]) on 12/03/2021 6:19:50 PM  Radiology DG Chest 1 View  Result Date: 12/03/2021 CLINICAL DATA:  Fall EXAM: CHEST  1 VIEW COMPARISON:  06/25/2021 FINDINGS: Cardiomegaly. Vascular congestion. No overt edema or confluent opacities. No effusions or pneumothorax. No acute bony abnormality. IMPRESSION: Cardiomegaly, vascular congestion Electronically Signed   By: Rolm Baptise M.D.   On: 12/03/2021 17:28   DG Pelvis 1-2 Views  Result Date: 12/03/2021 CLINICAL DATA:  Fall EXAM: PELVIS - 1-2 VIEW COMPARISON:  None. FINDINGS: There is no evidence of pelvic fracture or diastasis. No pelvic bone lesions are seen. IMPRESSION: Negative. Electronically Signed   By: Franchot Gallo M.D.   On: 12/03/2021 17:34   DG Elbow Complete Left  Result Date: 12/03/2021 CLINICAL  DATA:  Fall EXAM: LEFT ELBOW - COMPLETE 3+ VIEW COMPARISON:  None. FINDINGS: There is no evidence of fracture, dislocation, or joint effusion. There is no evidence of arthropathy or other focal bone abnormality. Soft tissues are unremarkable. IMPRESSION: Negative.  Electronically Signed   By: Franchot Gallo M.D.   On: 12/03/2021 17:27   DG Wrist 2 Views Left  Result Date: 12/03/2021 CLINICAL DATA:  Postreduction left wrist EXAM: LEFT WRIST - 2 VIEW COMPARISON:  12/03/2021 FINDINGS: In cast views of the left wrist demonstrate distal left radial fracture. Mild posterior displacement of the distal fragments, with alignment improved since prior study. No subluxation or dislocation. IMPRESSION: Improving alignment in the distal radial fracture with continued mild posterior displacement. Electronically Signed   By: Rolm Baptise M.D.   On: 12/03/2021 18:41   DG Wrist 2 Views Left  Result Date: 12/03/2021 CLINICAL DATA:  Fall EXAM: LEFT WRIST - 2 VIEW COMPARISON:  None. FINDINGS: Comminuted fracture distal radius with dorsal displacement. No fracture of the ulna. No carpal fracture identified. No fracture in the visualized hand. IMPRESSION: Comminuted and dorsally displaced fracture distal radius. Electronically Signed   By: Franchot Gallo M.D.   On: 12/03/2021 17:35   DG Ankle Complete Left  Result Date: 12/03/2021 CLINICAL DATA:  Fall EXAM: LEFT ANKLE COMPLETE - 3+ VIEW COMPARISON:  None. FINDINGS: Negative for acute fracture ORIF for chronic ankle fracture which is healed. Screw across the medial malleolus. Plate and screws across the distal fibula. Ankle joint space normal. IMPRESSION: Negative for acute fracture. Electronically Signed   By: Franchot Gallo M.D.   On: 12/03/2021 17:27   CT HEAD WO CONTRAST  Result Date: 12/03/2021 CLINICAL DATA:  88 year old post fall at home. On anticoagulation. Leading to left eye. EXAM: CT HEAD WITHOUT CONTRAST TECHNIQUE: Contiguous axial images were obtained from the base of the skull through the vertex without intravenous contrast. RADIATION DOSE REDUCTION: This exam was performed according to the departmental dose-optimization program which includes automated exposure control, adjustment of the mA and/or kV according to  patient size and/or use of iterative reconstruction technique. COMPARISON:  [place] [address] FINDINGS: Brain: Normal for age atrophy. Mild periventricular and deep chronic small vessel ischemia. No intracranial hemorrhage, mass effect, or midline shift. No hydrocephalus. The basilar cisterns are patent. No evidence of territorial infarct or acute ischemia. No extra-axial or intracranial fluid collection. Vascular: Atherosclerosis of skullbase vasculature without hyperdense vessel or abnormal calcification. Skull: No skull fracture.  No focal lesion. Sinuses/Orbits: Assessed on concurrent face CT, reported separately. Other: Left frontal scalp hematoma. IMPRESSION: 1. Left frontal scalp hematoma. No acute intracranial abnormality. No skull fracture. 2. Age related atrophy and chronic small vessel ischemia. Electronically Signed   By: Keith Rake M.D.   On: 12/03/2021 17:01   CT CERVICAL SPINE WO CONTRAST  Result Date: 12/03/2021 CLINICAL DATA:  88 year old post fall at home. EXAM: CT CERVICAL SPINE WITHOUT CONTRAST TECHNIQUE: Multidetector CT imaging of the cervical spine was performed without intravenous contrast. Multiplanar CT image reconstructions were also generated. RADIATION DOSE REDUCTION: This exam was performed according to the departmental dose-optimization program which includes automated exposure control, adjustment of the mA and/or kV according to patient size and/or use of iterative reconstruction technique. COMPARISON:  None. FINDINGS: Alignment: No traumatic subluxation. Mild broad-based levo scoliotic curvature. Trace anterolisthesis of C4 on C5 and C6 on C7, trace retrolisthesis of C5 on C6. Skull base and vertebrae: No acute fracture. Vertebral body heights are maintained. The dens  and skull base are intact. C1-C2 degenerative change with pannus. Soft tissues and spinal canal: No prevertebral fluid or swelling. No visible canal hematoma. Disc levels: Degenerative disc disease is most  prominent at C5-C6. There is mild multilevel facet hypertrophy. Upper chest: Assessed on concurrent chest CT, reported separately. Other: None. IMPRESSION: Degenerative change in the cervical spine without acute fracture or subluxation. Electronically Signed   By: Keith Rake M.D.   On: 12/03/2021 17:07   DG Hand 2 View Left  Result Date: 12/03/2021 CLINICAL DATA:  Fall. EXAM: LEFT HAND - 2 VIEW COMPARISON:  None. FINDINGS: Comminuted fracture distal radius with dorsal displacement. No hand fracture. Degenerative changes present in the PIP and D IP joints. IMPRESSION: Comminuted displaced fracture distal radius. Electronically Signed   By: Franchot Gallo M.D.   On: 12/03/2021 17:34   CT CHEST ABDOMEN PELVIS W CONTRAST  Result Date: 12/03/2021 CLINICAL DATA:  Fall.  Abdominal trauma, blunt EXAM: CT CHEST, ABDOMEN, AND PELVIS WITH CONTRAST TECHNIQUE: Multidetector CT imaging of the chest, abdomen and pelvis was performed following the standard protocol during bolus administration of intravenous contrast. RADIATION DOSE REDUCTION: This exam was performed according to the departmental dose-optimization program which includes automated exposure control, adjustment of the mA and/or kV according to patient size and/or use of iterative reconstruction technique. CONTRAST:  126mL OMNIPAQUE IOHEXOL 300 MG/ML  SOLN COMPARISON:  07/10/2021 FINDINGS: CT CHEST FINDINGS Cardiovascular: Heart is normal size. Aorta is normal caliber. Coronary artery and aortic calcifications. No aneurysm. Mediastinum/Nodes: No mediastinal, hilar, or axillary adenopathy. Trachea and esophagus are unremarkable. Thyroid unremarkable. Lungs/Pleura: Interstitial prominence peripherally in the lungs, likely chronic lung disease/early fibrosis. No acute confluent airspace opacities. No effusions or pneumothorax. Musculoskeletal: Chest wall soft tissues are unremarkable. No acute bony abnormality. CT ABDOMEN PELVIS FINDINGS Hepatobiliary: Prior  cholecystectomy. No focal hepatic abnormality or evidence of injury. Pancreas: No focal abnormality or ductal dilatation. Spleen: No splenic injury or perisplenic hematoma. Adrenals/Urinary Tract: No adrenal hemorrhage or renal injury identified. Bladder is unremarkable. No renal or adrenal mass. No hydronephrosis. Stomach/Bowel: Sigmoid diverticulosis. No active diverticulitis. Stomach and small bowel decompressed, unremarkable. Vascular/Lymphatic: Aortic atherosclerosis. No evidence of aneurysm or adenopathy. Reproductive: Prior hysterectomy.  No adnexal masses. Other: No free fluid or free air. Musculoskeletal: Slight depression through the superior endplate of L5 may reflect mild compression fracture. However, this has a stable appearance when compared to prior study compatible with a chronic process. No acute bony abnormality. IMPRESSION: No acute findings or evidence of significant traumatic injury in the chest, abdomen or pelvis. Coronary artery disease, aortic atherosclerosis. Interstitial prominence peripherally throughout the lungs, likely scarring/fibrosis. Sigmoid diverticulosis. Electronically Signed   By: Rolm Baptise M.D.   On: 12/03/2021 17:13   CT T-SPINE NO CHARGE  Result Date: 12/03/2021 CLINICAL DATA:  88 year old post fall. EXAM: CT Thoracic Spine with contrast TECHNIQUE: Multiplanar CT images of the thoracic spine were reconstructed from contemporary CT of the Chest. RADIATION DOSE REDUCTION: This exam was performed according to the departmental dose-optimization program which includes automated exposure control, adjustment of the mA and/or kV according to patient size and/or use of iterative reconstruction technique. CONTRAST:  No additional COMPARISON:  Reformats from chest CT 07/10/2021 FINDINGS: Alignment: No traumatic subluxation. Vertebrae: No acute fracture. Prominent Schmorl's node versus mild superior endplate compression fracture at T11 is unchanged in appearance from prior chest  CT, chronic. The posterior elements are intact. Paraspinal and other soft tissues: Assessed on concurrent chest CT, reported separately. Disc levels:  Mild diffuse degenerative change with disc space narrowing, endplate spurring, and vacuum phenomena at multiple levels. No significant canal stenosis. IMPRESSION: No acute fracture or traumatic subluxation of the thoracic spine. Electronically Signed   By: Keith Rake M.D.   On: 12/03/2021 17:15   CT L-SPINE NO CHARGE  Result Date: 12/03/2021 CLINICAL DATA:  88 year old post fall at home. EXAM: CT Lumbar spine with contrast TECHNIQUE: Multiplanar CT images of the lumbar spine were reconstructed from contemporary CT of the Chest, Abdomen, and Pelvis. RADIATION DOSE REDUCTION: This exam was performed according to the departmental dose-optimization program which includes automated exposure control, adjustment of the mA and/or kV according to patient size and/or use of iterative reconstruction technique. CONTRAST:  No additional COMPARISON:  Reformats from abdominopelvic CT 07/10/2021 FINDINGS: Segmentation: 5 lumbar type vertebrae. Alignment: Chronic dextroscoliosis. Chronic stepwise retrolisthesis of L2 on L3 and L3 on L4. No traumatic subluxation. Vertebrae: Diffuse bony under mineralization. No acute fracture. Chronic L5 superior endplate compression deformity, stable from prior exam. Right L1 transverse process has an unfused apophysis, chronic. Paraspinal and other soft tissues: Assessed in detail on concurrent abdominopelvic CT, reported separately. Disc levels: Multilevel degenerative disc disease. Multilevel facet hypertrophy. Multifactorial spinal canal stenosis at L4-L5. Disc protrusion at L3-L4 causes mass effect on the spinal canal and left neural foramina. Severe left L3-L4 neural foraminal narrowing. IMPRESSION: 1. No acute lumbar spine fracture. 2. Scoliosis with diffuse degenerative disc disease and facet hypertrophy. 3. Chronic L5 superior  endplate compression fracture. Electronically Signed   By: Keith Rake M.D.   On: 12/03/2021 17:11   DG Foot Complete Left  Result Date: 12/03/2021 CLINICAL DATA:  Fall EXAM: LEFT FOOT - COMPLETE 3+ VIEW COMPARISON:  None. FINDINGS: Negative for acute fracture. Mild arthropathy in the midfoot.  ORIF for chronic ankle injury. IMPRESSION: Negative for acute foot fracture. Electronically Signed   By: Franchot Gallo M.D.   On: 12/03/2021 17:32   CT MAXILLOFACIAL WO CONTRAST  Result Date: 12/03/2021 CLINICAL DATA:  88 year old post fall at home.  Leading to left eye. EXAM: CT MAXILLOFACIAL WITHOUT CONTRAST TECHNIQUE: Multidetector CT imaging of the maxillofacial structures was performed. Multiplanar CT image reconstructions were also generated. RADIATION DOSE REDUCTION: This exam was performed according to the departmental dose-optimization program which includes automated exposure control, adjustment of the mA and/or kV according to patient size and/or use of iterative reconstruction technique. COMPARISON:  None. FINDINGS: Osseous: No acute fracture of the nasal bone, zygomatic arches, or mandibles. Temporomandibular joints are congruent with degenerative change. Edentulous of upper teeth. On the a few remaining lower teeth. Orbits: No acute orbital fracture. No globe injury. Bilateral cataract resection. Sinuses: No sinus fracture or fluid level. No paranasal sinus mucosal thickening. Mastoid air cells are clear. Soft tissues: Left supraorbital scalp hematoma. Limited intracranial: Assessed on concurrent head CT, reported separately. IMPRESSION: Left supraorbital scalp hematoma. No acute orbital or facial bone fracture. Electronically Signed   By: Keith Rake M.D.   On: 12/03/2021 17:04    Procedures Procedures    Medications Ordered in ED Medications  Tdap (BOOSTRIX) injection 0.5 mL (0.5 mLs Intramuscular Given 12/03/21 1734)  iohexol (OMNIPAQUE) 300 MG/ML solution 100 mL (100 mLs  Intravenous Contrast Given 12/03/21 1655)  lidocaine (PF) (XYLOCAINE) 1 % injection 5 mL (5 mLs Infiltration Given by Other 12/03/21 1809)  ondansetron (ZOFRAN) injection 4 mg (4 mg Intravenous Given 12/03/21 1718)  fentaNYL (SUBLIMAZE) injection 100 mcg (100 mcg Intravenous Given 12/03/21 1805)  ondansetron (ZOFRAN) injection  4 mg (4 mg Intravenous Given 12/03/21 1846)    ED Course/ Medical Decision Making/ A&P                           Medical Decision Making Amount and/or Complexity of Data Reviewed Labs: ordered. Radiology: ordered. ECG/medicine tests: ordered.  Risk Prescription drug management.   This patient presents to the ED for concern of fall, this involves an extensive number of treatment options, and is a complaint that carries with it a high risk of complications and morbidity.  The differential diagnosis includes acute injuries   Co morbidities that complicate the patient evaluation  HTN, CAD, atrial fibrillation, reflux, hypothyroidism, T2DM, osteoporosis, CKD   Additional history obtained:  Additional history obtained from EMS, patient's son External records from outside source obtained and reviewed including EMR   Lab Tests:  I Ordered, and personally interpreted labs.  The pertinent results include: (Lab work pending at time of signout)   Imaging Studies ordered:  I ordered imaging studies including complete trauma scans, x-ray imaging of left elbow, wrist, hand, and ankle Results of imaging pending at time of signout   Cardiac Monitoring:  The patient was maintained on a cardiac monitor.  I personally viewed and interpreted the cardiac monitored which showed an underlying rhythm of: Atrial flutter   Medicines ordered and prescription drug management:  I ordered medication including fentanyl for analgesia; Tdap for tetanus prophylaxis Reevaluation of the patient after these medicines showed that the patient improved I have reviewed the patients home  medicines and have made adjustments as needed   Problem List / ED Course:  Highly functional 88 year old female presenting after ground-level fall at home.  Fall was mechanical in nature when she tripped up on the heel of her foot where.  This caused her fall, landing on her left side.  Her son was nearby and heard her hit the ground.  She did not lose consciousness.  She screamed immediately.  Given the hard impact, EMS was called immediately.  On arrival in the ED, patient is alert and oriented.  Airway is intact and patient has equal bilateral breath sounds.  Heart rate is normal.  Blood pressure is elevated.  On secondary survey, patient is noted to have a left-sided supraorbital hematoma with small overlying laceration, currently using a very small amount of blood; closed deformity to left wrist; tenderness to left anterior chest wall; tenderness to left ankle.  Patient is on Eliquis.  Prior to arrival, she did receive 100 mcg of fentanyl.  Given her persistent pain, additional fentanyl was ordered.  Tdap was ordered for tetanus prophylaxis, given her eyebrow laceration.  Trauma labs and imaging studies were ordered.  Care of patient was signed out to oncoming ED provider.   Reevaluation:  After the interventions noted above, I reevaluated the patient and found that they have :improved   Social Determinants of Health:  Patient is highly functional and lives at home with her son, who assists with her daily tasks.   Dispostion:  After consideration of the diagnostic results and the patients response to treatment, I feel that the patent would benefit from reassessment following diagnostic work-up.          Final Clinical Impression(s) / ED Diagnoses Final diagnoses:  Trauma  Closed fracture of left wrist, initial encounter  Facial laceration, initial encounter    Rx / DC Orders ED Discharge Orders  Ordered    ondansetron (ZOFRAN) 4 MG tablet  Every 6 hours         12/03/21 1925    oxyCODONE (ROXICODONE) 5 MG immediate release tablet  Every 8 hours PRN        12/03/21 1926              Godfrey Pick, MD 12/04/21 2013

## 2021-12-03 NOTE — Progress Notes (Signed)
°   12/03/21 1515  Clinical Encounter Type  Visited With Patient not available  Visit Type Initial;Trauma  Referral From Nurse  Consult/Referral To None   Chaplain responded to a level two trauma. Patient was being cared for by medical team and heading directly for testing. Chaplain advised nurse that if patient requested a chaplain when she returned please call someone would return.   [place] [facility] [phone]

## 2021-12-06 ENCOUNTER — Other Ambulatory Visit: Payer: Self-pay

## 2021-12-06 ENCOUNTER — Emergency Department (HOSPITAL_COMMUNITY)
Admission: EM | Admit: 2021-12-06 | Discharge: 2021-12-06 | Disposition: A | Discharge status: Home / Self Care | Payer: Medicare Other | Attending: Emergency Medicine | Admitting: Emergency Medicine

## 2021-12-06 ENCOUNTER — Emergency Department (HOSPITAL_COMMUNITY): Payer: Medicare Other

## 2021-12-06 DIAGNOSIS — I959 Hypotension, unspecified: Secondary | ICD-10-CM | POA: Diagnosis not present

## 2021-12-06 DIAGNOSIS — Z7901 Long term (current) use of anticoagulants: Secondary | ICD-10-CM | POA: Diagnosis not present

## 2021-12-06 DIAGNOSIS — S01112A Laceration without foreign body of left eyelid and periocular area, initial encounter: Secondary | ICD-10-CM | POA: Insufficient documentation

## 2021-12-06 DIAGNOSIS — W19XXXA Unspecified fall, initial encounter: Secondary | ICD-10-CM | POA: Insufficient documentation

## 2021-12-06 DIAGNOSIS — S52572D Other intraarticular fracture of lower end of left radius, subsequent encounter for closed fracture with routine healing: Secondary | ICD-10-CM | POA: Diagnosis not present

## 2021-12-06 DIAGNOSIS — R42 Dizziness and giddiness: Secondary | ICD-10-CM | POA: Diagnosis not present

## 2021-12-06 DIAGNOSIS — M25532 Pain in left wrist: Secondary | ICD-10-CM | POA: Diagnosis not present

## 2021-12-06 DIAGNOSIS — L299 Pruritus, unspecified: Secondary | ICD-10-CM | POA: Diagnosis not present

## 2021-12-06 DIAGNOSIS — S60912A Unspecified superficial injury of left wrist, initial encounter: Secondary | ICD-10-CM | POA: Diagnosis present

## 2021-12-06 DIAGNOSIS — S52572A Other intraarticular fracture of lower end of left radius, initial encounter for closed fracture: Secondary | ICD-10-CM | POA: Diagnosis not present

## 2021-12-06 DIAGNOSIS — M25539 Pain in unspecified wrist: Secondary | ICD-10-CM | POA: Diagnosis not present

## 2021-12-06 DIAGNOSIS — S52592A Other fractures of lower end of left radius, initial encounter for closed fracture: Secondary | ICD-10-CM | POA: Diagnosis not present

## 2021-12-06 MED ORDER — OXYCODONE-ACETAMINOPHEN 5-325 MG PO TABS
0.5000 | ORAL_TABLET | Freq: Once | ORAL | Status: AC
  Administered 2021-12-06: 0.5 via ORAL
  Filled 2021-12-06: qty 1

## 2021-12-06 MED ORDER — OXYCODONE HCL 5 MG PO TABS: 5.0000 mg | ORAL_TABLET | Freq: Three times a day (TID) | ORAL | 0 refills | Status: AC | PRN

## 2021-12-06 NOTE — ED Notes (Signed)
Successful application of cast

## 2021-12-06 NOTE — Progress Notes (Signed)
Orthopedic Tech Progress Note Patient Details:  EMERLYNN BAGNATO 01-24-1925 [id number]  Ortho Devices Type of Ortho Device: Sugartong splint Ortho Device/Splint Location: LUE Ortho Device/Splint Interventions: Ordered, Application, Adjustment   Post Interventions Patient Tolerated: Fair Instructions Provided: Care of device, Poper ambulation with device  Warrene Kapfer 12/06/2021, 6:39 AM

## 2021-12-06 NOTE — ED Notes (Signed)
Ortho tech at bedside for cast

## 2021-12-06 NOTE — ED Triage Notes (Signed)
Pt bib [facility] for wrist pain. Pt was hospitalized on Tuesday for a fall (on eliquis) and discharged with a wrist splint for fractured wrist. Pts son called 911 because pt took off wrist splint and refused to put it back on.   P 101, BP 98/58, CBG 183

## 2021-12-06 NOTE — ED Notes (Signed)
Pts son Raiford Noble verbalized understanding of d/c instructions, meds and followup care. Pt has cast in place and CNS intact, warm dry skin with good capillary refill. Pt son denies questions. VSS, no distress noted. W/C to exit with all belongings.

## 2021-12-06 NOTE — Discharge Instructions (Addendum)
It was a pleasure caring for you today in the emergency department. ° °Please return to the emergency department for any worsening or worrisome symptoms. ° ° °

## 2021-12-06 NOTE — Progress Notes (Signed)
Orthopedic Tech Progress Note Patient Details:  Victoria Gomez [birth year] [id number]  Casting Type of Cast: Long arm cast Cast Location: LUE Cast Material: Fiberglass Cast Intervention: Application  Post Interventions Patient Tolerated: Fair Instructions Provided: Care of device, Poper ambulation with device  Patient was being difficult while applying cast. Around palm area was done to best of ability. Clemens Lachman A Zandyr Barnhill 12/06/2021, 9:15 AM

## 2021-12-06 NOTE — ED Provider Notes (Addendum)
[facility] EMERGENCY DEPARTMENT Provider Note   CSN: [id number] Arrival date & time: 12/06/21  [id number]     History  Chief Complaint  Patient presents with   Wrist Pain    Victoria Gomez is a 88 y.o. female.  Patient brought to the emergency department for evaluation of left wrist pain.  Patient had a fall 2 days ago and was diagnosed with a wrist fracture.  Patient was splinted in the emergency department but at some point the splint was removed.  Patient complaining of pain with movement of the wrist.  Patient denies any new injury.      Home Medications Prior to Admission medications   Medication Sig Start Date End Date Taking? Authorizing Provider  acetaminophen (TYLENOL) 500 MG tablet Take 1,000 mg by mouth every 8 (eight) hours as needed.    [provider]  albuterol (VENTOLIN HFA) 108 (90 Base) MCG/ACT inhaler Inhale 2 puffs into the lungs every 6 (six) hours as needed for wheezing or shortness of breath. 06/26/21   Nche, Charlene Brooke, NP  apixaban (ELIQUIS) 2.5 MG TABS tablet TAKE 1 TABLET TWICE A DAY 05/07/21   Minus Breeding, MD  ascorbic acid (VITAMIN C) 500 MG tablet Take 500 mg by mouth daily.    [provider]  benzonatate (TESSALON PERLES) 100 MG capsule Take 1 capsule (100 mg total) by mouth 3 (three) times daily as needed. 11/12/21   Marin Olp, MD  Cholecalciferol (D3) 50 MCG (2000 UT) TABS Take by mouth daily.     [provider]  Cyanocobalamin (B-12) 3000 MCG CAPS Take by mouth.    [provider]  fluticasone (FLONASE) 50 MCG/ACT nasal spray Place 1-2 sprays into both nostrils daily. 11/16/20   Wieters, Hallie C, PA-C  furosemide (LASIX) 20 MG tablet TAKE 3 TABLETS BY MOUTH EVERY DAY 06/27/21   Marin Olp, MD  isosorbide mononitrate (IMDUR) 120 MG 24 hr tablet TAKE 1 TABLET (120 MG TOTAL) BY MOUTH DAILY. NEED OV. 09/16/21   Minus Breeding, MD  KLOR-CON M10 10 MEQ tablet TAKE 1 TABLET BY MOUTH EVERY  DAY 09/16/21   Marin Olp, MD  lansoprazole (PREVACID) 30 MG capsule TAKE 1 CAPSULE TWICE DAILY 05/03/21   Minus Breeding, MD  loratadine (CLARITIN) 10 MG tablet Take 1 tablet (10 mg total) by mouth daily. 11/16/20   Wieters, Hallie C, PA-C  nitroGLYCERIN (NITROSTAT) 0.4 MG SL tablet 1 TAB UNDER THE TONGUE EVERY 5 MINS AS NEEDED FOR CHEST PAIN (IF HAVE PAIN AFTER 3 PILLS- CALL 911). 04/19/21   Marin Olp, MD  ondansetron (ZOFRAN) 4 MG tablet Take 1 tablet (4 mg total) by mouth every 6 (six) hours. 12/03/21   Curatolo, Adam, DO  ONETOUCH VERIO test strip USE TO TEST BLOOD SUGARS DAILY. DX: E11.9 01/22/21   Marin Olp, MD  oxyCODONE (ROXICODONE) 5 MG immediate release tablet Take 1 tablet (5 mg total) by mouth every 8 (eight) hours as needed for up to 10 doses for severe pain. 12/03/21   Curatolo, Adam, DO  pravastatin (PRAVACHOL) 40 MG tablet TAKE 1 TABLET BY MOUTH EVERY DAY 01/28/21   Marin Olp, MD  SYNTHROID 50 MCG tablet TAKE 1 TABLET DAILY BEFORE BREAKFAST. 05/05/21   Marin Olp, MD  traMADol (ULTRAM) 50 MG tablet Take 1 tablet (50 mg total) by mouth every 6 (six) hours as needed. 10/03/20   Robyn Haber, MD  valsartan (DIOVAN) 160 MG tablet Take 1  tablet (160 mg total) by mouth daily. 01/24/21   Marin Olp, MD      Allergies    Codeine and Sulfonamide derivatives    Review of Systems   Review of Systems  Physical Exam Updated Vital Signs BP (!) 148/58    Pulse (!) 104    Temp 98.2 F (36.8 C) (Oral)    Resp 16    Ht 5\' 3"  (1.6 m)    Wt 64.8 kg    SpO2 96%    BMI 25.31 kg/m  Physical Exam Constitutional:      Appearance: Normal appearance.  HENT:     Head: Contusion (Face) and laceration (Left eyebrow, repaired) present.  Cardiovascular:     Rate and Rhythm: Rhythm irregularly irregular.     Heart sounds: Normal heart sounds.  Pulmonary:     Effort: Pulmonary effort is normal.     Breath sounds: Normal breath sounds.  Abdominal:      Palpations: Abdomen is soft.  Musculoskeletal:     Left wrist: Swelling and deformity present. Decreased range of motion.     Cervical back: Normal range of motion.  Skin:    Findings: Bruising (Left side of face, left wrist) present.  Neurological:     Mental Status: She is alert. Mental status is at baseline.     Cranial Nerves: Cranial nerves 2-12 are intact.     Sensory: Sensation is intact.     Motor: Motor function is intact.    ED Results / Procedures / Treatments   Labs (all labs ordered are listed, but only abnormal results are displayed) Labs Reviewed - No data to display  EKG None  Radiology DG Wrist Complete Left  Result Date: 12/06/2021 CLINICAL DATA:  Fall with recent wrist fracture EXAM: LEFT WRIST - COMPLETE 3+ VIEW COMPARISON:  Three days ago FINDINGS: Comminuted distal radius with intra-articular extension. Impaction, especially dorsally, with dorsal wrist tilting that is progressed from prior. Bone fragment ventral to the distal radius which appears more widely-spaced than before. No new fracture seen. Generalized osteopenia. IMPRESSION: Comminuted distal radius with progressive dorsal impaction since 3 days ago. Electronically Signed   By: Jorje Guild M.D.   On: 12/06/2021 05:28    Procedures Procedures    Medications Ordered in ED Medications  oxyCODONE-acetaminophen (PERCOCET/ROXICET) 5-325 MG per tablet 0.5 tablet (0.5 tablets Oral Given 12/06/21 0547)    ED Course/ Medical Decision Making/ A&P                           Medical Decision Making Amount and/or Complexity of Data Reviewed Radiology: ordered.  Risk Prescription drug management.   Patient presents to the emergency department for evaluation of left wrist pain.  Patient has a known left wrist fracture from a fall earlier this week.  Patient arrives without a splint in place.  There is bruising, swelling noted to the wrist.  Area is diffusely tender.  X-ray shows some further angulation  of the fracture since her previous ED visit.  Will place in a sugar-tong splint and needs to follow-up with orthopedics for definitive care.  SPLINT APPLICATION Date/Time: [id number] AM Authorized by: Orpah Greek Consent: Verbal consent obtained. Risks and benefits: risks, benefits and alternatives were discussed Consent given by: patient Splint applied by: orthopedic technician Location details: left wrist Splint type: sugar tong Supplies used: orthoglass Post-procedure: The splinted body part was neurovascularly unchanged following the procedure. Patient tolerance: Patient  tolerated the procedure well with no immediate complications.     Addendum: Splint was placed by the [facility]otech and arrangements were made for the patient to be discharged.  Prior to discharge, however, she was noted to have taken her splint off once again.  It does not appear that she will maintain an Ortho-Glass splint, will consult orthopedics to see if there is a more definitive treatment option for her wrist fracture today.  Addendum: Discussed with Dr. Ninfa Linden.  Recommends a loose cast placed by [employer] and then follow-up in the office with Dr. Tempie Donning.   Final Clinical Impression(s) / ED Diagnoses Final diagnoses:  Other closed intra-articular fracture of distal end of left radius with routine healing, subsequent encounter    Rx / DC Orders ED Discharge Orders     None         Seth Friedlander, Gwenyth Allegra, MD 12/06/21 [id number]    Orpah Greek, MD 12/06/21 0703    Orpah Greek, MD 12/06/21 [phone]

## 2021-12-06 NOTE — ED Notes (Signed)
Pt unable to tolerate casting due to pain at this time, RN asked for pain medication and ortho tech will reattempt.

## 2021-12-06 NOTE — ED Notes (Signed)
Patient has taken every thing offshe is confuesed  monitor was off.

## 2021-12-12 ENCOUNTER — Other Ambulatory Visit: Payer: Self-pay

## 2021-12-12 ENCOUNTER — Ambulatory Visit (INDEPENDENT_AMBULATORY_CARE_PROVIDER_SITE_OTHER): Payer: Medicare Other | Admitting: Orthopedic Surgery

## 2021-12-12 ENCOUNTER — Encounter: Payer: Self-pay | Admitting: Orthopedic Surgery

## 2021-12-12 ENCOUNTER — Ambulatory Visit (INDEPENDENT_AMBULATORY_CARE_PROVIDER_SITE_OTHER): Payer: Medicare Other

## 2021-12-12 VITALS — BP 146/80 | HR 98 | Ht 62.0 in | Wt 142.0 lb

## 2021-12-12 DIAGNOSIS — M25532 Pain in left wrist: Secondary | ICD-10-CM

## 2021-12-12 DIAGNOSIS — S52532A Colles' fracture of left radius, initial encounter for closed fracture: Secondary | ICD-10-CM | POA: Diagnosis not present

## 2021-12-19 ENCOUNTER — Ambulatory Visit (INDEPENDENT_AMBULATORY_CARE_PROVIDER_SITE_OTHER): Payer: Medicare Other

## 2021-12-19 ENCOUNTER — Ambulatory Visit (INDEPENDENT_AMBULATORY_CARE_PROVIDER_SITE_OTHER): Payer: Medicare Other | Admitting: Orthopedic Surgery

## 2021-12-19 ENCOUNTER — Other Ambulatory Visit: Payer: Self-pay

## 2021-12-19 ENCOUNTER — Encounter: Payer: Self-pay | Admitting: Orthopedic Surgery

## 2021-12-19 DIAGNOSIS — S52532A Colles' fracture of left radius, initial encounter for closed fracture: Secondary | ICD-10-CM

## 2021-12-19 DIAGNOSIS — S52502A Unspecified fracture of the lower end of left radius, initial encounter for closed fracture: Secondary | ICD-10-CM | POA: Insufficient documentation

## 2021-12-19 NOTE — Progress Notes (Signed)
Office Visit Note   Patient: Victoria Gomez           Date of Birth: 11/05/1925           MRN: [id number] Visit Date: 12/12/2021              Requested by: Marin Olp, MD [place],   [id number] PCP: Marin Olp, MD   Assessment & Plan: Visit Diagnoses:  1. Pain in left wrist   2. Closed Colles' fracture of left radius, initial encounter     Plan: Discussed with patient and son that we will treat her fracture nonoperatively with a period of cast immobilization.  We discussed that radiographic parameters do not correlate with functional outcomes in patients of her age and activity level.  I will see her back next week for a repeat x-rays.   Follow-Up Instructions: No follow-ups on file.   Orders:  Orders Placed This Encounter  Procedures   XR Wrist Complete Left   No orders of the defined types were placed in this encounter.     Procedures: Casting  Date/Time: 12/19/2021 5:11 PM Performed by: Sherilyn Cooter, MD Authorized by: Sherilyn Cooter, MD   Consent Given by:  Patient Timeout: prior to procedure the correct patient, procedure, and site was verified   Location:  Wrist  left wrist Fracture Type: distal radius   Neurovascularly intact   Distal Perfusion: normal   Distal Sensation: normal   Manipulation Performed?: No   Immobilization:  Cast Is this the patient's first cast for this injury?: Yes   Cast Type:  Short arm Supplies Used:  Fiberglass and cotton padding Neurovascularly intact   Distal Perfusion: normal   Distal Sensation: normal   Patient tolerance:  Patient tolerated the procedure well with no immediate complications   Clinical Data: No additional findings.   Subjective: Chief Complaint  Patient presents with   Left Wrist - New Patient (Initial Visit)    This is a 88 yo RHD F who presents for ER follow up of a left distal radius fracture that occurred approximately one week ago after she tripped on  her dog.  She describes pain in the distal radius.  She denies numbness or paresthesias.  She has extensive facial ecchymosis.   Review of Systems   Objective: Vital Signs: BP (!) 146/80 (BP Location: Right Arm, Patient Position: Sitting, Cuff Size: Large)    Pulse 98    Ht 5\' 2"  (1.575 m)    Wt 142 lb (64.4 kg)    SpO2 97%    BMI 25.97 kg/m   Physical Exam Constitutional:      Appearance: Normal appearance.  Cardiovascular:     Rate and Rhythm: Normal rate.     Pulses: Normal pulses.  Pulmonary:     Effort: Pulmonary effort is normal.  Skin:    General: Skin is warm and dry.     Capillary Refill: Capillary refill takes less than 2 seconds.  Neurological:     Mental Status: She is alert.    Left Hand Exam   Tenderness  Left hand tenderness location: TTP at dorsal aspect of distal radius w/ moderate swelling.   Other  Erythema: absent Sensation: normal Pulse: present  Comments:  Diffuse ecchymosis of the hand with mild to moderate finger swelling.  ROM of wrist limited by pain and swelling.      Specialty Comments:  No specialty comments available.  Imaging: XR Wrist 2 Views  Left  Result Date: 12/19/2021 2V of the left wrist demonstrate continued dorsal angulation and shortening of the fracture with questionable interval increase in angulation though this may be projectional.     PMFS History: Patient Active Problem List   Diagnosis Date Noted   Closed fracture of left distal radius 12/19/2021   Strain of lumbar region 09/27/2021   Sciatica of left side 09/27/2021   Ascending aortic aneurysm 07/11/2021   Educated about COVID-19 virus infection 05/03/2020   Stenosis of carotid artery 05/03/2020   Chronic atrial fibrillation ([place]) 05/03/2020   Chronic anticoagulation 07/09/2017   Left groin hernia 06/25/2017   Osteoporosis 06/28/2015   Vitamin B12 deficiency 07/28/2014   CKD (chronic kidney disease), stage III ([place]) 09/22/2013   LBBB (left bundle branch  block) 09/22/2013   Controlled type 2 diabetes mellitus with renal manifestation ([place]) 09/22/2013   Internal hemorrhoids    Permanent atrial fibrillation ([place]) 09/21/2013   H/O sinus bradycardia 08/10/2012   Orthostatic hypotension 08/10/2012   Fatigue 04/20/2012   Combined systolic and diastolic congestive heart failure ([place]) 11/17/2011   ARTHRITIS 12/30/2010   Left lumbar radiculopathy 12/11/2010   Interstitial cystitis    Hyperlipidemia associated with type 2 diabetes mellitus ([place]) 03/07/2008   Esophageal reflux 10/18/2007   Hypothyroidism 08/03/2007   Hypertension associated with diabetes ([place]) 08/03/2007   Coronary artery disease involving native coronary artery of native heart with angina pectoris ([place]) 08/03/2007   History of Barrett's esophagus 08/03/2007   Past Medical History:  Diagnosis Date   Arthritis    Atrial fibrillation ([place])    a. Dx 09/2013.   Bladder disorder    Bradycardia 08/2012   a. Eval for symptomatic bradycardia 08/2012 - beta blocker discontinued.   BREAST MASS, BENIGN    CAD (coronary artery disease)    a. s/p PCI to RCA '01. b. repeat PCI to RCA '03 2/2 ISR. c. cath 2012: RCA dz, rx medically.  Occluded RCA.  Nonobstructive disease elsewhere   Chronic combined systolic and diastolic CHF (congestive heart failure) ([place])    a. EF 40-45% in 2014. b. EF reported to be normal in 03/2015 nuc.   CKD (chronic kidney disease), stage III ([place])    DIVERTICULOSIS, COLON    Epistaxis    Esophageal reflux    History of breast lump/mass excision 05/23/2009   Qualifier: History of  By: Arnoldo Morale MD, Balinda Quails    HYPERLIPIDEMIA 03/07/2008   HYPERTENSION 08/03/2007   HYPOTHYROIDISM    Internal hemorrhoids    Interstitial cystitis    LBBB (left bundle branch block)    Orthostatic hypotension 08/2012   a. 08/2012 w/ syncope - meds adjusted.    Right hand fracture     Family History  Problem Relation Age of Onset   Colon cancer Mother    Coronary artery disease  Mother    Heart disease Mother    Coronary artery disease Father    Heart disease Father    Colon polyps Sister    Coronary artery disease Sister    Diabetes type II Son    Coronary artery disease Brother     Past Surgical History:  Procedure Laterality Date   ABDOMINAL HYSTERECTOMY     ANKLE RECONSTRUCTION     Left   APPENDECTOMY     BREAST SURGERY     benign mass   CARDIOVERSION N/A 10/20/2013   CARDIOVERSION N/A 04/24/2015   Procedure: CARDIOVERSION;  Surgeon: Dorothy Spark, MD;  Location: [facility];  Service: Cardiovascular;  Laterality: N/A;   CATARACT EXTRACTION, BILATERAL     CESAREAN SECTION     CHOLECYSTECTOMY     LEFT HEART CATH AND CORONARY ANGIOGRAPHY N/A 09/16/2017   Procedure: LEFT HEART CATH AND CORONARY ANGIOGRAPHY;  Surgeon: Martinique, Peter M, MD;  Location: [facility];  Service: Cardiovascular;  Laterality: N/A;   TONSILLECTOMY     WRIST RECONSTRUCTION     Right   Social History   Occupational History   Occupation: retired  Tobacco Use   Smoking status: Former    Packs/day: 1.00    Years: 15.00    Pack years: 15.00    Types: Cigarettes    Quit date: 11/10/1969    Years since quitting: 52.1   Smokeless tobacco: Never  Vaping Use   Vaping Use: Never used  Substance and Sexual Activity   Alcohol use: No    Alcohol/week: 0.0 standard drinks   Drug use: No   Sexual activity: Never    Birth control/protection: Post-menopausal

## 2021-12-19 NOTE — Progress Notes (Signed)
Office Visit Note   Patient: Victoria Gomez           Date of Birth: Apr 02, 1925           MRN: [id number] Visit Date: 12/19/2021              Requested by: Marin Olp, MD [place],  [place] [id number] PCP: Marin Olp, MD   Assessment & Plan: Visit Diagnoses:  1. Closed Colles' fracture of left radius, initial encounter     Plan: Patient is now two weeks out from her injury.  Her pain is well controlled.  She is doing well with the cast.  X-rays show possible increased dorsal angulation but may be projectional.  I'll see her back in two weeks at which time we can maybe transition her to a brace.  Follow-Up Instructions: No follow-ups on file.   Orders:  Orders Placed This Encounter  Procedures   XR Wrist 2 Views Left   No orders of the defined types were placed in this encounter.     Procedures: No procedures performed   Clinical Data: No additional findings.   Subjective: Chief Complaint  Patient presents with   Left Wrist - Pain    This is a 88 yo F who presents for ER follow up of a left distal radius fracture after a GLF approximately two weeks ago.  She was seen on 12/12/21 at which time we put her in a short arm cast.  She has done well since then.  Her pain is well controlled.  Her biggest complaint is not being able to cook with the cast on.    Review of Systems   Objective: Vital Signs: There were no vitals taken for this visit.  Physical Exam  Left Hand Exam   Other  Erythema: absent Sensation: normal Pulse: present  Comments:  Cast clean and dry.  Fingers warm and well perfused.  Can flex/extend all fingers within limits of cast.      Specialty Comments:  No specialty comments available.  Imaging: No results found.   PMFS History: Patient Active Problem List   Diagnosis Date Noted   Strain of lumbar region 09/27/2021   Sciatica of left side 09/27/2021   Ascending aortic aneurysm 07/11/2021   Educated  about COVID-19 virus infection 05/03/2020   Stenosis of carotid artery 05/03/2020   Chronic atrial fibrillation ([place]) 05/03/2020   Chronic anticoagulation 07/09/2017   Left groin hernia 06/25/2017   Osteoporosis 06/28/2015   Vitamin B12 deficiency 07/28/2014   CKD (chronic kidney disease), stage III ([place]) 09/22/2013   LBBB (left bundle branch block) 09/22/2013   Controlled type 2 diabetes mellitus with renal manifestation ([place]) 09/22/2013   Internal hemorrhoids    Permanent atrial fibrillation ([place]) 09/21/2013   H/O sinus bradycardia 08/10/2012   Orthostatic hypotension 08/10/2012   Fatigue 04/20/2012   Combined systolic and diastolic congestive heart failure ([place]) 11/17/2011   ARTHRITIS 12/30/2010   Left lumbar radiculopathy 12/11/2010   Interstitial cystitis    Hyperlipidemia associated with type 2 diabetes mellitus ([place]) 03/07/2008   Esophageal reflux 10/18/2007   Hypothyroidism 08/03/2007   Hypertension associated with diabetes ([place]) 08/03/2007   Coronary artery disease involving native coronary artery of native heart with angina pectoris ([place]) 08/03/2007   History of Barrett's esophagus 08/03/2007   Past Medical History:  Diagnosis Date   Arthritis    Atrial fibrillation ([place])    a. Dx 09/2013.   Bladder disorder  Bradycardia 08/2012   a. Eval for symptomatic bradycardia 08/2012 - beta blocker discontinued.   BREAST MASS, BENIGN    CAD (coronary artery disease)    a. s/p PCI to RCA '01. b. repeat PCI to RCA '03 2/2 ISR. c. cath 2012: RCA dz, rx medically.  Occluded RCA.  Nonobstructive disease elsewhere   Chronic combined systolic and diastolic CHF (congestive heart failure) ([place])    a. EF 40-45% in 2014. b. EF reported to be normal in 03/2015 nuc.   CKD (chronic kidney disease), stage III ([place])    DIVERTICULOSIS, COLON    Epistaxis    Esophageal reflux    History of breast lump/mass excision 05/23/2009   Qualifier: History of  By: Arnoldo Morale MD, Balinda Quails     HYPERLIPIDEMIA 03/07/2008   HYPERTENSION 08/03/2007   HYPOTHYROIDISM    Internal hemorrhoids    Interstitial cystitis    LBBB (left bundle branch block)    Orthostatic hypotension 08/2012   a. 08/2012 w/ syncope - meds adjusted.    Right hand fracture     Family History  Problem Relation Age of Onset   Colon cancer Mother    Coronary artery disease Mother    Heart disease Mother    Coronary artery disease Father    Heart disease Father    Colon polyps Sister    Coronary artery disease Sister    Diabetes type II Son    Coronary artery disease Brother     Past Surgical History:  Procedure Laterality Date   ABDOMINAL HYSTERECTOMY     ANKLE RECONSTRUCTION     Left   APPENDECTOMY     BREAST SURGERY     benign mass   CARDIOVERSION N/A 10/20/2013   CARDIOVERSION N/A 04/24/2015   Procedure: CARDIOVERSION;  Surgeon: Dorothy Spark, MD;  Location: [facility];  Service: Cardiovascular;  Laterality: N/A;   CATARACT EXTRACTION, BILATERAL     CESAREAN SECTION     CHOLECYSTECTOMY     LEFT HEART CATH AND CORONARY ANGIOGRAPHY N/A 09/16/2017   Procedure: LEFT HEART CATH AND CORONARY ANGIOGRAPHY;  Surgeon: Martinique, Peter M, MD;  Location: [facility];  Service: Cardiovascular;  Laterality: N/A;   TONSILLECTOMY     WRIST RECONSTRUCTION     Right   Social History   Occupational History   Occupation: retired  Tobacco Use   Smoking status: Former    Packs/day: 1.00    Years: 15.00    Pack years: 15.00    Types: Cigarettes    Quit date: 11/10/1969    Years since quitting: 52.1   Smokeless tobacco: Never  Vaping Use   Vaping Use: Never used  Substance and Sexual Activity   Alcohol use: No    Alcohol/week: 0.0 standard drinks   Drug use: No   Sexual activity: Never    Birth control/protection: Post-menopausal

## 2021-12-20 ENCOUNTER — Telehealth: Payer: Self-pay | Admitting: Orthopedic Surgery

## 2021-12-20 NOTE — Telephone Encounter (Signed)
Pt's daughter called. Pt was in office yesterday for arm fracture. They were expecting an Rx for oxycodone sent in and [facility] does not have this. Please advise.

## 2021-12-30 ENCOUNTER — Telehealth: Payer: Self-pay | Admitting: Pharmacist

## 2021-12-30 NOTE — Progress Notes (Signed)
Chronic Care Management Pharmacy Assistant   Name: Victoria Gomez  MRN: [id number] DOB: [birth year]   Reason for Encounter: General Adherence Call    Recent office visits:  11/12/2021 VV (PCP) Shelva MajesticHunter, Stephen O, MD; Rx Jerilynn Somessalon Perles for cough  09/27/2021 Adella HareV Hudnell, Stephanie, NP; Rx prednisone for pain  09/05/2021 OV (PCP) Shelva MajesticHunter, Stephen O, MD;  no medication changes indicated.  Recent consult visits:  12/19/2021 OV (Orthopedics) Marlyne BeardsBenfield, Charlie, MD; no medication changes indicated.  12/12/2021 OV (Orthopedics) Marlyne BeardsBenfield, Charlie, MD; no medication changes indicated.  Hospital visits:  12/06/2021 ED visit for interarticular fracture of distal end of left radius with routine healing -No medication changes indicated.  12/03/2021 ED visit for closed fracture of left wrist -Patient had fall at home -wrist brace -oxycodone 5 mg q 8hrs prn  Medications: Outpatient Encounter Medications as of 12/30/2021  Medication Sig   acetaminophen (TYLENOL) 500 MG tablet Take 1,000 mg by mouth every 8 (eight) hours as needed.   albuterol (VENTOLIN HFA) 108 (90 Base) MCG/ACT inhaler Inhale 2 puffs into the lungs every 6 (six) hours as needed for wheezing or shortness of breath.   apixaban (ELIQUIS) 2.5 MG TABS tablet TAKE 1 TABLET TWICE A DAY   ascorbic acid (VITAMIN C) 500 MG tablet Take 500 mg by mouth daily.   benzonatate (TESSALON PERLES) 100 MG capsule Take 1 capsule (100 mg total) by mouth 3 (three) times daily as needed.   Cholecalciferol (D3) 50 MCG (2000 UT) TABS Take by mouth daily.    Cyanocobalamin (B-12) 3000 MCG CAPS Take by mouth.   fluticasone (FLONASE) 50 MCG/ACT nasal spray Place 1-2 sprays into both nostrils daily.   furosemide (LASIX) 20 MG tablet TAKE 3 TABLETS BY MOUTH EVERY DAY   isosorbide mononitrate (IMDUR) 120 MG 24 hr tablet TAKE 1 TABLET (120 MG TOTAL) BY MOUTH DAILY. NEED OV.   KLOR-CON M10 10 MEQ tablet TAKE 1 TABLET BY MOUTH EVERY DAY   lansoprazole  (PREVACID) 30 MG capsule TAKE 1 CAPSULE TWICE DAILY   loratadine (CLARITIN) 10 MG tablet Take 1 tablet (10 mg total) by mouth daily.   nitroGLYCERIN (NITROSTAT) 0.4 MG SL tablet 1 TAB UNDER THE TONGUE EVERY 5 MINS AS NEEDED FOR CHEST PAIN (IF HAVE PAIN AFTER 3 PILLS- CALL 911).   ondansetron (ZOFRAN) 4 MG tablet Take 1 tablet (4 mg total) by mouth every 6 (six) hours.   ONETOUCH VERIO test strip USE TO TEST BLOOD SUGARS DAILY. DX: E11.9   oxyCODONE (ROXICODONE) 5 MG immediate release tablet Take 1 tablet (5 mg total) by mouth every 8 (eight) hours as needed for up to 10 doses for severe pain.   oxyCODONE (ROXICODONE) 5 MG immediate release tablet Take 1 tablet (5 mg total) by mouth every 8 (eight) hours as needed for severe pain.   pravastatin (PRAVACHOL) 40 MG tablet TAKE 1 TABLET BY MOUTH EVERY DAY   SYNTHROID 50 MCG tablet TAKE 1 TABLET DAILY BEFORE BREAKFAST.   traMADol (ULTRAM) 50 MG tablet Take 1 tablet (50 mg total) by mouth every 6 (six) hours as needed.   valsartan (DIOVAN) 160 MG tablet Take 1 tablet (160 mg total) by mouth daily.   No facility-administered encounter medications on file as of 12/30/2021.   **Unsuccessful attempt at reaching patient or her son to complete this call**  Care Gaps: Medicare Annual Wellness: last AWV 08/01/2019 Ophthalmology Exam: Next due on 09/17/2022 Foot Exam: Overude since 10/16/2021 Hemoglobin A1C: 6.4% on 04/16/2021 Colonoscopy: Aged out  Dexa Scan: Completed Mammogram: Aged out  Future Appointments  Date Time Provider [place]  01/02/2022  2:15 PM Sherilyn Cooter, MD [facility] None   Star Rating Drugs: Valsartan 160 mg last filled 12/09/2021 90 DS Pravastatin 40 mg last filled 09/17/2021 1 DS  April D Calhoun, [place] Pharmacist Assistant [phone]

## 2022-01-02 ENCOUNTER — Ambulatory Visit: Payer: Self-pay

## 2022-01-02 ENCOUNTER — Ambulatory Visit (INDEPENDENT_AMBULATORY_CARE_PROVIDER_SITE_OTHER): Payer: Medicare Other | Admitting: Orthopedic Surgery

## 2022-01-02 ENCOUNTER — Other Ambulatory Visit: Payer: Self-pay | Admitting: Orthopedic Surgery

## 2022-01-02 ENCOUNTER — Telehealth: Payer: Self-pay

## 2022-01-02 ENCOUNTER — Other Ambulatory Visit: Payer: Self-pay

## 2022-01-02 DIAGNOSIS — S52532A Colles' fracture of left radius, initial encounter for closed fracture: Secondary | ICD-10-CM | POA: Diagnosis not present

## 2022-01-02 MED ORDER — TRAMADOL HCL 50 MG PO TABS: 50.0000 mg | ORAL_TABLET | Freq: Four times a day (QID) | ORAL | 0 refills | Status: AC | PRN

## 2022-01-02 NOTE — Progress Notes (Signed)
Office Visit Note   Patient: Victoria Gomez           Date of Birth: 07/14/1925           MRN: [id number] Visit Date: 01/02/2022              Requested by: Marin Olp, MD [place],  [place] [address] PCP: Marin Olp, MD   Assessment & Plan: Visit Diagnoses:  1. Closed Colles' fracture of left radius, initial encounter     Plan: Patient is now 4 weeks out from her injury.  X-rays show questionable interval healing.  Her pain is improving.  She has been in a cast since my first visit with.  She does not want to be put back into a cast today.  We will put her in a volar splint for now then transition her to a removable brace.  I'll see her in another two weeks.   Follow-Up Instructions: No follow-ups on file.   Orders:  Orders Placed This Encounter  Procedures   XR Wrist 2 Views Left   No orders of the defined types were placed in this encounter.     Procedures: No procedures performed   Clinical Data: No additional findings.   Subjective: Chief Complaint  Patient presents with   Left Wrist - Follow-up, Fracture    DOI: 12/03/21    This is a 88 yo F who presents for follow of a left distal radius fracture after a GLF approximately 4 weeks ago.  She has been treated nonoperatively in a cast.  Her pain is improved today.     Review of Systems   Objective: Vital Signs: There were no vitals taken for this visit.  Physical Exam  Left Hand Exam   Tenderness  Left hand tenderness location: TTP across dorsum of wrist.   Other  Erythema: absent Sensation: normal Pulse: present  Comments:  Ecchymosis of ulnar and volar mid forearm that was present at her last visit.  Mild deformity of the wrist secondary to dorsal fracture angulation.  Moderate wrist swelling.  Able to make complete fist.      Specialty Comments:  No specialty comments available.  Imaging: No results found.   PMFS History: Patient Active Problem List    Diagnosis Date Noted   Closed fracture of left distal radius 12/19/2021   Strain of lumbar region 09/27/2021   Sciatica of left side 09/27/2021   Ascending aortic aneurysm 07/11/2021   Educated about COVID-19 virus infection 05/03/2020   Stenosis of carotid artery 05/03/2020   Chronic atrial fibrillation ([place]) 05/03/2020   Chronic anticoagulation 07/09/2017   Left groin hernia 06/25/2017   Osteoporosis 06/28/2015   Vitamin B12 deficiency 07/28/2014   CKD (chronic kidney disease), stage III ([place]) 09/22/2013   LBBB (left bundle branch block) 09/22/2013   Controlled type 2 diabetes mellitus with renal manifestation ([place]) 09/22/2013   Internal hemorrhoids    Permanent atrial fibrillation ([place]) 09/21/2013   H/O sinus bradycardia 08/10/2012   Orthostatic hypotension 08/10/2012   Fatigue 04/20/2012   Combined systolic and diastolic congestive heart failure ([place]) 11/17/2011   ARTHRITIS 12/30/2010   Left lumbar radiculopathy 12/11/2010   Interstitial cystitis    Hyperlipidemia associated with type 2 diabetes mellitus ([place]) 03/07/2008   Esophageal reflux 10/18/2007   Hypothyroidism 08/03/2007   Hypertension associated with diabetes ([place]) 08/03/2007   Coronary artery disease involving native coronary artery of native heart with angina pectoris ([place]) 08/03/2007  History of Barrett's esophagus 08/03/2007   Past Medical History:  Diagnosis Date   Arthritis    Atrial fibrillation ([place])    a. Dx 09/2013.   Bladder disorder    Bradycardia 08/2012   a. Eval for symptomatic bradycardia 08/2012 - beta blocker discontinued.   BREAST MASS, BENIGN    CAD (coronary artery disease)    a. s/p PCI to RCA '01. b. repeat PCI to RCA '03 2/2 ISR. c. cath 2012: RCA dz, rx medically.  Occluded RCA.  Nonobstructive disease elsewhere   Chronic combined systolic and diastolic CHF (congestive heart failure) ([place])    a. EF 40-45% in 2014. b. EF reported to be normal in 03/2015 nuc.   CKD (chronic kidney  disease), stage III ([place])    DIVERTICULOSIS, COLON    Epistaxis    Esophageal reflux    History of breast lump/mass excision 05/23/2009   Qualifier: History of  By: Arnoldo Morale MD, Balinda Quails    HYPERLIPIDEMIA 03/07/2008   HYPERTENSION 08/03/2007   HYPOTHYROIDISM    Internal hemorrhoids    Interstitial cystitis    LBBB (left bundle branch block)    Orthostatic hypotension 08/2012   a. 08/2012 w/ syncope - meds adjusted.    Right hand fracture     Family History  Problem Relation Age of Onset   Colon cancer Mother    Coronary artery disease Mother    Heart disease Mother    Coronary artery disease Father    Heart disease Father    Colon polyps Sister    Coronary artery disease Sister    Diabetes type II Son    Coronary artery disease Brother     Past Surgical History:  Procedure Laterality Date   ABDOMINAL HYSTERECTOMY     ANKLE RECONSTRUCTION     Left   APPENDECTOMY     BREAST SURGERY     benign mass   CARDIOVERSION N/A 10/20/2013   CARDIOVERSION N/A 04/24/2015   Procedure: CARDIOVERSION;  Surgeon: Dorothy Spark, MD;  Location: [facility];  Service: Cardiovascular;  Laterality: N/A;   CATARACT EXTRACTION, BILATERAL     CESAREAN SECTION     CHOLECYSTECTOMY     LEFT HEART CATH AND CORONARY ANGIOGRAPHY N/A 09/16/2017   Procedure: LEFT HEART CATH AND CORONARY ANGIOGRAPHY;  Surgeon: Martinique, Peter M, MD;  Location: [facility];  Service: Cardiovascular;  Laterality: N/A;   TONSILLECTOMY     WRIST RECONSTRUCTION     Right   Social History   Occupational History   Occupation: retired  Tobacco Use   Smoking status: Former    Packs/day: 1.00    Years: 15.00    Pack years: 15.00    Types: Cigarettes    Quit date: 11/10/1969    Years since quitting: 52.1   Smokeless tobacco: Never  Vaping Use   Vaping Use: Never used  Substance and Sexual Activity   Alcohol use: No    Alcohol/week: 0.0 standard drinks   Drug use: No   Sexual activity: Never    Birth  control/protection: Post-menopausal

## 2022-01-02 NOTE — Telephone Encounter (Signed)
Patient asking for something for pain. [facility] [address]

## 2022-01-06 ENCOUNTER — Other Ambulatory Visit: Payer: Self-pay | Admitting: Family Medicine

## 2022-01-14 ENCOUNTER — Other Ambulatory Visit: Payer: Self-pay | Admitting: *Deleted

## 2022-01-14 ENCOUNTER — Telehealth: Payer: Self-pay | Admitting: Pharmacist

## 2022-01-14 MED ORDER — PRAVASTATIN SODIUM 40 MG PO TABS: 40.0000 mg | ORAL_TABLET | Freq: Every day | ORAL | 1 refills | Status: DC

## 2022-01-14 NOTE — Chronic Care Management (AMB) (Signed)
? ? ?Chronic Care Management ?Pharmacy Assistant  ? ?Name: Victoria Gomez  MRN: [id number] DOB: 07-Nov-1925 ? ?Reason for Encounter: General Adherence Call ?  ? ?Recent office visits:  ?None ? ?Recent consult visits:  ?01/02/2022 OV (Orthopedics) Marlyne Beards, MD; no medication changes indicated. ? ?Hospital visits:  ?None in previous 6 months ? ?Medications: ?Outpatient Encounter Medications as of 01/14/2022  ?Medication Sig  ? acetaminophen (TYLENOL) 500 MG tablet Take 1,000 mg by mouth every 8 (eight) hours as needed.  ? albuterol (VENTOLIN HFA) 108 (90 Base) MCG/ACT inhaler Inhale 2 puffs into the lungs every 6 (six) hours as needed for wheezing or shortness of breath.  ? apixaban (ELIQUIS) 2.5 MG TABS tablet TAKE 1 TABLET TWICE A DAY  ? ascorbic acid (VITAMIN C) 500 MG tablet Take 500 mg by mouth daily.  ? benzonatate (TESSALON PERLES) 100 MG capsule Take 1 capsule (100 mg total) by mouth 3 (three) times daily as needed.  ? Cholecalciferol (D3) 50 MCG (2000 UT) TABS Take by mouth daily.   ? Cyanocobalamin (B-12) 3000 MCG CAPS Take by mouth.  ? fluticasone (FLONASE) 50 MCG/ACT nasal spray Place 1-2 sprays into both nostrils daily.  ? furosemide (LASIX) 20 MG tablet TAKE 3 TABLETS BY MOUTH EVERY DAY  ? isosorbide mononitrate (IMDUR) 120 MG 24 hr tablet TAKE 1 TABLET (120 MG TOTAL) BY MOUTH DAILY. NEED OV.  ? KLOR-CON M10 10 MEQ tablet TAKE 1 TABLET BY MOUTH EVERY DAY  ? lansoprazole (PREVACID) 30 MG capsule TAKE 1 CAPSULE TWICE DAILY  ? loratadine (CLARITIN) 10 MG tablet Take 1 tablet (10 mg total) by mouth daily.  ? nitroGLYCERIN (NITROSTAT) 0.4 MG SL tablet 1 TAB UNDER THE TONGUE EVERY 5 MINS AS NEEDED FOR CHEST PAIN (IF HAVE PAIN AFTER 3 PILLS- CALL 911).  ? ondansetron (ZOFRAN) 4 MG tablet Take 1 tablet (4 mg total) by mouth every 6 (six) hours.  ? ONETOUCH VERIO test strip USE TO TEST BLOOD SUGARS DAILY. DX: E11.9  ? oxyCODONE (ROXICODONE) 5 MG immediate release tablet Take 1 tablet (5 mg total) by mouth  every 8 (eight) hours as needed for up to 10 doses for severe pain.  ? oxyCODONE (ROXICODONE) 5 MG immediate release tablet Take 1 tablet (5 mg total) by mouth every 8 (eight) hours as needed for severe pain.  ? pravastatin (PRAVACHOL) 40 MG tablet TAKE 1 TABLET BY MOUTH EVERY DAY  ? SYNTHROID 50 MCG tablet TAKE 1 TABLET DAILY BEFORE BREAKFAST.  ? traMADol (ULTRAM) 50 MG tablet Take 1 tablet (50 mg total) by mouth every 6 (six) hours as needed.  ? valsartan (DIOVAN) 160 MG tablet Take 1 tablet (160 mg total) by mouth daily.  ? ?No facility-administered encounter medications on file as of 01/14/2022.  ? ?Patient Questions: ?Have you had any problems recently with your health? ?*Unsuccessful outreach to patient and or her son last month for an adherence call. Her son returned my call today 01/14/2022. He states the patient has fallen and broke her arm. She is supposed to get her cast of on 01/16/2022.  ? ?Have you had any problems with your pharmacy? ?Patients son states the patient has not had any problems recently with her pharmacy. ? ?What issues or side effects are you having with your medications? ?Patients son states the patient is not currently having any issues or any side effects with any of her medications. ? ?What would you like me to pass along to Erskine Emery, CPP for him  to help you with?  ?Patients son does not have anything for me to pass along at this time. ? ?What can we do to take care of you better? ?Patients son states did not have any suggestions at this time. ? ?Care Gaps: ?Medicare Annual Wellness: Last AWV 08/01/2019 ?Ophthalmology Exam: Next due on 09/17/2022 ?Foot Exam: Overdue since 10/16/2021 ?Hemoglobin A1C: 6.4% on 04/16/2021 ?Colonoscopy: Aged out ?Dexa Scan: Completed ?Mammogram: Aged out ? ?Future Appointments  ?Date Time Provider Department Center  ?01/16/2022  2:30 PM Marlyne Beards, MD [facility] None  ? ?Star Rating Drugs: ?Pravastatin 40 mg last filled 09/17/2021 - patients son states she needs  refills on this medication. -Rx refill request sent to team [facility]. ? ?April D Calhoun, CMA ?Clinical Pharmacist Assistant ?[phone]  ?

## 2022-01-16 ENCOUNTER — Ambulatory Visit: Payer: Medicare Other | Admitting: Orthopedic Surgery

## 2022-01-23 ENCOUNTER — Ambulatory Visit: Payer: Medicare Other | Admitting: Orthopedic Surgery

## 2022-02-25 ENCOUNTER — Ambulatory Visit: Payer: Self-pay

## 2022-02-25 ENCOUNTER — Ambulatory Visit (INDEPENDENT_AMBULATORY_CARE_PROVIDER_SITE_OTHER): Payer: Medicare Other | Admitting: Orthopedic Surgery

## 2022-02-25 DIAGNOSIS — S52532A Colles' fracture of left radius, initial encounter for closed fracture: Secondary | ICD-10-CM | POA: Diagnosis not present

## 2022-02-25 NOTE — Progress Notes (Signed)
? ?Office Visit Note ?  ?Patient: Victoria Gomez           ?Date of Birth: [birth year]           ?MRN: [id number] ?Visit Date: 02/25/2022 ?             ?Requested by: Marin Olp, MD ?[address] ?[place],  [place] [address] ?PCP: Marin Olp, MD ? ? ?Assessment & Plan: ?Visit Diagnoses:  ?1. Closed Colles' fracture of left radius, initial encounter   ? ? ?Plan: Patient is now 11 weeks out from her injury.  We treated her distal radius fracture nonoperatively in a cast followed by a brace.  She notes that her wrist pain is improved.  She is able to make a complete fist.  She still has some ulnar-sided wrist pain that seems to be overall improved since her last visit.  She can continue to use her wrist as tolerated.  She will wear a wrist brace for comfort.  I can see her back in another 1 or 2 months. ? ?Follow-Up Instructions: No follow-ups on file.  ? ?Orders:  ?Orders Placed This Encounter  ?Procedures  ? XR Wrist Complete Left  ? ?No orders of the defined types were placed in this encounter. ? ? ? ? Procedures: ?No procedures performed ? ? ?Clinical Data: ?No additional findings. ? ? ?Subjective: ?Chief Complaint  ?Patient presents with  ? Left Wrist - Follow-up, Fracture  ?  Says it still hurts, she is not wearing anything on the wrist  ? ? ?This is a 88 year old right-hand-dominant female who presents for follow-up of a closed left distal wrist fracture after ground-level fall.  We have treated her nonoperatively in a cast followed by a brace.  She is not been seen for the last 2 months as she missed her previous follow-up appointments.  She notes that her wrist pain is overall improved but she still has some pain on the ulnar side.  She is able to make a full fist.  She is able to use her hand for her day activities including dishwashing and cleaning. ? ? ?Review of Systems ? ? ?Objective: ?Vital Signs: There were no vitals taken for this visit. ? ?Physical Exam ?Constitutional:   ?   Appearance:  Normal appearance.  ?Cardiovascular:  ?   Rate and Rhythm: Normal rate.  ?   Pulses: Normal pulses.  ?Pulmonary:  ?   Effort: Pulmonary effort is normal.  ?Skin: ?   General: Skin is warm and dry.  ?   Capillary Refill: Capillary refill takes less than 2 seconds.  ?Neurological:  ?   Mental Status: She is alert.  ? ? ?Left Hand Exam  ? ?Tenderness  ?Left hand tenderness location: TTP at ulnar side of wrist.  ? ?Other  ?Erythema: absent ?Sensation: normal ?Pulse: present ? ?Comments:  Deformity at wrist secondary to dorsal angulation of fracture. No pain w/ A/PROM of wrist w/ flexion/extension.  Mild discomfort with ulnar deviation.  Full pronation and supination.  Able to make a near complete fist.  ? ? ? ? ?Specialty Comments:  ?No specialty comments available. ? ?Imaging: ?No results found. ? ? ?PMFS History: ?Patient Active Problem List  ? Diagnosis Date Noted  ? Closed fracture of left distal radius 12/19/2021  ? Strain of lumbar region 09/27/2021  ? Sciatica of left side 09/27/2021  ? Ascending aortic aneurysm ([place]) 07/11/2021  ? Educated about COVID-19 virus infection 05/03/2020  ? Stenosis  of carotid artery 05/03/2020  ? Chronic atrial fibrillation ([facility]) 05/03/2020  ? Chronic anticoagulation 07/09/2017  ? Left groin hernia 06/25/2017  ? Osteoporosis 06/28/2015  ? Vitamin B12 deficiency 07/28/2014  ? CKD (chronic kidney disease), stage III ([facility]) 09/22/2013  ? LBBB (left bundle branch block) 09/22/2013  ? Controlled type 2 diabetes mellitus with renal manifestation ([facility]) 09/22/2013  ? Internal hemorrhoids   ? Permanent atrial fibrillation ([facility]) 09/21/2013  ? H/O sinus bradycardia 08/10/2012  ? Orthostatic hypotension 08/10/2012  ? Fatigue 04/20/2012  ? Combined systolic and diastolic congestive heart failure ([facility]) 11/17/2011  ? ARTHRITIS 12/30/2010  ? Left lumbar radiculopathy 12/11/2010  ? Interstitial cystitis   ? Hyperlipidemia associated with type 2 diabetes mellitus ([facility]) 03/07/2008  ? Esophageal reflux  10/18/2007  ? Hypothyroidism 08/03/2007  ? Hypertension associated with diabetes ([facility]) 08/03/2007  ? Coronary artery disease involving native coronary artery of native heart with angina pectoris ([facility]) 08/03/2007  ? History of Barrett's esophagus 08/03/2007  ? ?Past Medical History:  ?Diagnosis Date  ? Arthritis   ? Atrial fibrillation ([facility])   ? a. Dx 09/2013.  ? Bladder disorder   ? Bradycardia 08/2012  ? a. Eval for symptomatic bradycardia 08/2012 - beta blocker discontinued.  ? BREAST MASS, BENIGN   ? CAD (coronary artery disease)   ? a. s/p PCI to RCA '01. b. repeat PCI to RCA '03 2/2 ISR. c. cath 2012: RCA dz, rx medically.  Occluded RCA.  Nonobstructive disease elsewhere  ? Chronic combined systolic and diastolic CHF (congestive heart failure) ([facility])   ? a. EF 40-45% in 2014. b. EF reported to be normal in 03/2015 nuc.  ? CKD (chronic kidney disease), stage III ([facility])   ? DIVERTICULOSIS, COLON   ? Epistaxis   ? Esophageal reflux   ? History of breast lump/mass excision 05/23/2009  ? Qualifier: History of  By: Lovell Sheehan MD, Balinda Quails   ? HYPERLIPIDEMIA 03/07/2008  ? HYPERTENSION 08/03/2007  ? HYPOTHYROIDISM   ? Internal hemorrhoids   ? Interstitial cystitis   ? LBBB (left bundle branch block)   ? Orthostatic hypotension 08/2012  ? a. 08/2012 w/ syncope - meds adjusted.   ? Right hand fracture   ?  ?Family History  ?Problem Relation Age of Onset  ? Colon cancer Mother   ? Coronary artery disease Mother   ? Heart disease Mother   ? Coronary artery disease Father   ? Heart disease Father   ? Colon polyps Sister   ? Coronary artery disease Sister   ? Diabetes type II Son   ? Coronary artery disease Brother   ?  ?Past Surgical History:  ?Procedure Laterality Date  ? ABDOMINAL HYSTERECTOMY    ? ANKLE RECONSTRUCTION    ? Left  ? APPENDECTOMY    ? BREAST SURGERY    ? benign mass  ? CARDIOVERSION N/A 10/20/2013  ? CARDIOVERSION N/A 04/24/2015  ? Procedure: CARDIOVERSION;  Surgeon: Lars Masson, MD;  [facility] END[facility];   Service: Cardiovascular;  Laterality: N/A;  ? CATARACT EXTRACTION, BILATERAL    ? CESAREAN SECTION    ? CHOLECYSTECTOMY    ? LEFT HEART CATH AND CORONARY ANGIOGRAPHY N/A 09/16/2017  ? Procedure: LEFT HEART CATH AND CORONARY ANGIOGRAPHY;  Surgeon: [place], Peter M, MD;  Location: [facility] INVASIVE CV LAB;  Service: Cardiovascular;  Laterality: N/A;  ? TONSILLECTOMY    ? WRIST RECONSTRUCTION    ? Right  ? ?Social History  ? ?Occupational History  ? Occupation:  retired  ?Tobacco Use  ? Smoking status: Former  ?  Packs/day: 1.00  ?  Years: 15.00  ?  Pack years: 15.00  ?  Types: Cigarettes  ?  Quit date: 11/10/1969  ?  Years since quitting: 52.3  ? Smokeless tobacco: Never  ?Vaping Use  ? Vaping Use: Never used  ?Substance and Sexual Activity  ? Alcohol use: No  ?  Alcohol/week: 0.0 standard drinks  ? Drug use: No  ? Sexual activity: Never  ?  Birth control/protection: Post-menopausal  ? ? ? ? ? ? ?

## 2022-03-05 ENCOUNTER — Other Ambulatory Visit: Payer: Self-pay | Admitting: Family Medicine

## 2022-03-27 ENCOUNTER — Other Ambulatory Visit: Payer: Self-pay | Admitting: Cardiology

## 2022-03-27 DIAGNOSIS — K2100 Gastro-esophageal reflux disease with esophagitis, without bleeding: Secondary | ICD-10-CM

## 2022-04-10 ENCOUNTER — Telehealth: Payer: Self-pay | Admitting: Family Medicine

## 2022-04-10 NOTE — Telephone Encounter (Signed)
Copied from CRM [id number]. Topic: Medicare AWV >> Apr 10, 2022 10:15 AM Harris-Coley, Avon Gully wrote: Reason for CRM: Attempted to schedule AWV. Unable to LVM.  Will try at later time.

## 2022-05-20 ENCOUNTER — Encounter: Payer: Self-pay | Admitting: Cardiology

## 2022-05-23 ENCOUNTER — Other Ambulatory Visit: Payer: Self-pay

## 2022-05-23 ENCOUNTER — Emergency Department (HOSPITAL_COMMUNITY): Payer: Medicare Other

## 2022-05-23 ENCOUNTER — Telehealth: Payer: Self-pay | Admitting: Family Medicine

## 2022-05-23 DIAGNOSIS — Z8249 Family history of ischemic heart disease and other diseases of the circulatory system: Secondary | ICD-10-CM

## 2022-05-23 DIAGNOSIS — I5043 Acute on chronic combined systolic (congestive) and diastolic (congestive) heart failure: Secondary | ICD-10-CM | POA: Diagnosis present

## 2022-05-23 DIAGNOSIS — Z79890 Hormone replacement therapy (postmenopausal): Secondary | ICD-10-CM

## 2022-05-23 DIAGNOSIS — I2109 ST elevation (STEMI) myocardial infarction involving other coronary artery of anterior wall: Secondary | ICD-10-CM

## 2022-05-23 DIAGNOSIS — I50814 Right heart failure due to left heart failure: Secondary | ICD-10-CM

## 2022-05-23 DIAGNOSIS — I5082 Biventricular heart failure: Secondary | ICD-10-CM | POA: Diagnosis present

## 2022-05-23 DIAGNOSIS — R059 Cough, unspecified: Secondary | ICD-10-CM | POA: Diagnosis not present

## 2022-05-23 DIAGNOSIS — E118 Type 2 diabetes mellitus with unspecified complications: Secondary | ICD-10-CM

## 2022-05-23 DIAGNOSIS — I25119 Atherosclerotic heart disease of native coronary artery with unspecified angina pectoris: Secondary | ICD-10-CM | POA: Diagnosis present

## 2022-05-23 DIAGNOSIS — I4821 Permanent atrial fibrillation: Secondary | ICD-10-CM | POA: Diagnosis present

## 2022-05-23 DIAGNOSIS — I3139 Other pericardial effusion (noninflammatory): Secondary | ICD-10-CM | POA: Diagnosis not present

## 2022-05-23 DIAGNOSIS — I482 Chronic atrial fibrillation, unspecified: Secondary | ICD-10-CM | POA: Diagnosis not present

## 2022-05-23 DIAGNOSIS — R570 Cardiogenic shock: Secondary | ICD-10-CM | POA: Diagnosis present

## 2022-05-23 DIAGNOSIS — K219 Gastro-esophageal reflux disease without esophagitis: Secondary | ICD-10-CM | POA: Diagnosis present

## 2022-05-23 DIAGNOSIS — Z79899 Other long term (current) drug therapy: Secondary | ICD-10-CM | POA: Diagnosis not present

## 2022-05-23 DIAGNOSIS — R0789 Other chest pain: Secondary | ICD-10-CM | POA: Diagnosis not present

## 2022-05-23 DIAGNOSIS — Z7901 Long term (current) use of anticoagulants: Secondary | ICD-10-CM

## 2022-05-23 DIAGNOSIS — R000 Tachycardia, unspecified: Secondary | ICD-10-CM | POA: Diagnosis not present

## 2022-05-23 DIAGNOSIS — I083 Combined rheumatic disorders of mitral, aortic and tricuspid valves: Secondary | ICD-10-CM | POA: Diagnosis present

## 2022-05-23 DIAGNOSIS — Z955 Presence of coronary angioplasty implant and graft: Secondary | ICD-10-CM | POA: Diagnosis not present

## 2022-05-23 DIAGNOSIS — R0902 Hypoxemia: Secondary | ICD-10-CM | POA: Diagnosis not present

## 2022-05-23 DIAGNOSIS — R079 Chest pain, unspecified: Secondary | ICD-10-CM | POA: Diagnosis not present

## 2022-05-23 DIAGNOSIS — I4891 Unspecified atrial fibrillation: Secondary | ICD-10-CM | POA: Diagnosis not present

## 2022-05-23 DIAGNOSIS — I959 Hypotension, unspecified: Secondary | ICD-10-CM | POA: Diagnosis not present

## 2022-05-23 DIAGNOSIS — Z66 Do not resuscitate: Secondary | ICD-10-CM | POA: Diagnosis not present

## 2022-05-23 DIAGNOSIS — I5020 Unspecified systolic (congestive) heart failure: Secondary | ICD-10-CM | POA: Diagnosis not present

## 2022-05-23 DIAGNOSIS — E039 Hypothyroidism, unspecified: Secondary | ICD-10-CM | POA: Diagnosis present

## 2022-05-23 DIAGNOSIS — N1831 Chronic kidney disease, stage 3a: Secondary | ICD-10-CM | POA: Diagnosis present

## 2022-05-23 DIAGNOSIS — E785 Hyperlipidemia, unspecified: Secondary | ICD-10-CM | POA: Diagnosis present

## 2022-05-23 DIAGNOSIS — Z515 Encounter for palliative care: Secondary | ICD-10-CM | POA: Diagnosis not present

## 2022-05-23 DIAGNOSIS — I447 Left bundle-branch block, unspecified: Secondary | ICD-10-CM | POA: Diagnosis present

## 2022-05-23 DIAGNOSIS — D649 Anemia, unspecified: Secondary | ICD-10-CM | POA: Diagnosis present

## 2022-05-23 DIAGNOSIS — I130 Hypertensive heart and chronic kidney disease with heart failure and stage 1 through stage 4 chronic kidney disease, or unspecified chronic kidney disease: Secondary | ICD-10-CM | POA: Diagnosis present

## 2022-05-23 DIAGNOSIS — I77810 Thoracic aortic ectasia: Secondary | ICD-10-CM | POA: Diagnosis not present

## 2022-05-23 DIAGNOSIS — Z87891 Personal history of nicotine dependence: Secondary | ICD-10-CM

## 2022-05-23 DIAGNOSIS — Z7189 Other specified counseling: Secondary | ICD-10-CM | POA: Diagnosis not present

## 2022-05-23 DIAGNOSIS — Z833 Family history of diabetes mellitus: Secondary | ICD-10-CM

## 2022-05-23 DIAGNOSIS — I214 Non-ST elevation (NSTEMI) myocardial infarction: Principal | ICD-10-CM | POA: Diagnosis present

## 2022-05-23 DIAGNOSIS — E876 Hypokalemia: Secondary | ICD-10-CM | POA: Diagnosis not present

## 2022-05-23 DIAGNOSIS — M810 Age-related osteoporosis without current pathological fracture: Secondary | ICD-10-CM | POA: Diagnosis present

## 2022-05-23 DIAGNOSIS — E1122 Type 2 diabetes mellitus with diabetic chronic kidney disease: Secondary | ICD-10-CM | POA: Diagnosis present

## 2022-05-23 DIAGNOSIS — I509 Heart failure, unspecified: Secondary | ICD-10-CM | POA: Diagnosis not present

## 2022-05-23 DIAGNOSIS — I10 Essential (primary) hypertension: Secondary | ICD-10-CM

## 2022-05-23 LAB — CBC WITH DIFFERENTIAL/PLATELET
Abs Immature Granulocytes: 0.01 10*3/uL (ref 0.00–0.07)
Basophils Absolute: 0 10*3/uL (ref 0.0–0.1)
Basophils Relative: 1 %
Eosinophils Absolute: 0.1 10*3/uL (ref 0.0–0.5)
Eosinophils Relative: 1 %
HCT: 32.9 % — ABNORMAL LOW (ref 36.0–46.0)
Hemoglobin: 10.6 g/dL — ABNORMAL LOW (ref 12.0–15.0)
Immature Granulocytes: 0 %
Lymphocytes Relative: 30 %
Lymphs Abs: 1.8 10*3/uL (ref 0.7–4.0)
MCH: 29.8 pg (ref 26.0–34.0)
MCHC: 32.2 g/dL (ref 30.0–36.0)
MCV: 92.4 fL (ref 80.0–100.0)
Monocytes Absolute: 0.4 10*3/uL (ref 0.1–1.0)
Monocytes Relative: 6 %
Neutro Abs: 3.7 10*3/uL (ref 1.7–7.7)
Neutrophils Relative %: 62 %
Platelets: 203 10*3/uL (ref 150–400)
RBC: 3.56 MIL/uL — ABNORMAL LOW (ref 3.87–5.11)
RDW: 13.4 % (ref 11.5–15.5)
WBC: 6 10*3/uL (ref 4.0–10.5)
nRBC: 0 % (ref 0.0–0.2)

## 2022-05-23 LAB — BASIC METABOLIC PANEL
Anion gap: 11 (ref 5–15)
BUN: 21 mg/dL (ref 8–23)
CO2: 23 mmol/L (ref 22–32)
Calcium: 7.9 mg/dL — ABNORMAL LOW (ref 8.9–10.3)
Chloride: 109 mmol/L (ref 98–111)
Creatinine, Ser: 1.06 mg/dL — ABNORMAL HIGH (ref 0.44–1.00)
GFR, Estimated: 48 mL/min — ABNORMAL LOW (ref >60)
Glucose, Bld: 122 mg/dL — ABNORMAL HIGH (ref 70–99)
Potassium: 2.7 mmol/L — CL (ref 3.5–5.1)
Sodium: 143 mmol/L (ref 135–145)

## 2022-05-23 LAB — TROPONIN I (HIGH SENSITIVITY): Troponin I (High Sensitivity): 1356 ng/L (ref <18)

## 2022-05-23 MED ORDER — NITROGLYCERIN 2 % TD OINT
1.0000 [in_us] | TOPICAL_OINTMENT | Freq: Once | TRANSDERMAL | Status: AC
Start: 2022-05-24 — End: 2022-05-24
  Administered 2022-05-24: 1 [in_us] via TOPICAL
  Filled 2022-05-23: qty 1

## 2022-05-23 MED ORDER — POTASSIUM CHLORIDE 10 MEQ/100ML IV SOLN
10.0000 meq | INTRAVENOUS | Status: AC
  Administered 2022-05-24 (×2): 10 meq via INTRAVENOUS
  Filled 2022-05-23 (×2): qty 100

## 2022-05-23 MED ORDER — POTASSIUM CHLORIDE CRYS ER 20 MEQ PO TBCR
40.0000 meq | EXTENDED_RELEASE_TABLET | Freq: Once | ORAL | Status: AC
  Administered 2022-05-24: 40 meq via ORAL
  Filled 2022-05-23: qty 2

## 2022-05-23 NOTE — ED Triage Notes (Signed)
BIB EMS reports ongoing chest pain since yesterday, worse tonight. Pt reports taking 2x nitro with some relief. Pt also mentioned she's been coughing for a couple of days now.

## 2022-05-23 NOTE — Telephone Encounter (Signed)
Patient's son, Raiford Noble, called stating he wanted to make patient a check up visit. Upon starting to schedule, I noticed patient had recently been triaged. I proceeded to ask the son about this and what the triage nurse said when he got a bit defensive and began stating they didn't need a nurse. I tried to get more information about this but he became slightly difficult to communicate with. Upon further investigation, I need see patient was due for a 6 month follow up with Dr. Durene Cal and was able to schedule her for 06/02/22 at 4:20pm. There was no call from the triage nurse or note provided. Please advise.

## 2022-05-23 NOTE — ED Notes (Addendum)
Critical Troponin & Potassium values reported to EDP Preston Fleeting; no new orders at this time.

## 2022-05-23 NOTE — ED Provider Notes (Signed)
[facility] EMERGENCY DEPARTMENT Provider Note   CSN: [id number] Arrival date & time: 05/30/2022  2251     History {Add pertinent medical, surgical, social history, OB history to HPI:1} Chief Complaint  Patient presents with   Chest Pain    Victoria Gomez is a 88 y.o. female.  The history is provided by the patient.  Chest Pain She has history of hypertension, diabetes, hyperlipidemia, permanent atrial fibrillation anticoagulated on apixaban, combined systolic and diastolic heart failure, coronary artery disease and comes in because of chest pain.  She had an episode of chest pain yesterday which resolved.  She took a nitroglycerin for it, but it was out of date.  She had another episode of chest pain this afternoon which was more severe.  Pain is sharp and in the midsternal area with radiation to the left side of the chest and also into the neck and back.  There is associated dyspnea but no nausea or diaphoresis.  The pain resolved on its own, but recurred again tonight just prior to coming to the emergency department.  She came by ambulance where she was given aspirin and nitroglycerin with complete resolution of her pain.  She is completely pain-free now.   Home Medications Prior to Admission medications   Medication Sig Start Date End Date Taking? Authorizing Provider  acetaminophen (TYLENOL) 500 MG tablet Take 1,000 mg by mouth every 8 (eight) hours as needed.    [provider]  albuterol (VENTOLIN HFA) 108 (90 Base) MCG/ACT inhaler Inhale 2 puffs into the lungs every 6 (six) hours as needed for wheezing or shortness of breath. 06/26/21   Nche, Bonna Gains, NP  apixaban (ELIQUIS) 2.5 MG TABS tablet Take 1 tablet (2.5 mg total) by mouth 2 (two) times daily. TAKE 1 TABLET TWICE A DAY 03/27/22   Rollene Rotunda, MD  ascorbic acid (VITAMIN C) 500 MG tablet Take 500 mg by mouth daily.    [provider]  benzonatate (TESSALON PERLES) 100 MG capsule Take  1 capsule (100 mg total) by mouth 3 (three) times daily as needed. 11/12/21   Shelva Majestic, MD  Cholecalciferol (D3) 50 MCG (2000 UT) TABS Take by mouth daily.     [provider]  Cyanocobalamin (B-12) 3000 MCG CAPS Take by mouth.    [provider]  fluticasone (FLONASE) 50 MCG/ACT nasal spray Place 1-2 sprays into both nostrils daily. 11/16/20   Wieters, Hallie C, PA-C  furosemide (LASIX) 20 MG tablet TAKE 3 TABLETS BY MOUTH EVERY DAY 06/27/21   Shelva Majestic, MD  isosorbide mononitrate (IMDUR) 120 MG 24 hr tablet TAKE 1 TABLET (120 MG TOTAL) BY MOUTH DAILY. NEED OV. 09/16/21   Rollene Rotunda, MD  KLOR-CON M10 10 MEQ tablet TAKE 1 TABLET BY MOUTH EVERY DAY 01/06/22   Shelva Majestic, MD  lansoprazole (PREVACID) 30 MG capsule Take 1 capsule (30 mg total) by mouth 2 (two) times daily before a meal. Schedule an appointment for further refills 03/27/22   Rollene Rotunda, MD  loratadine (CLARITIN) 10 MG tablet Take 1 tablet (10 mg total) by mouth daily. 11/16/20   Wieters, Hallie C, PA-C  nitroGLYCERIN (NITROSTAT) 0.4 MG SL tablet 1 TAB UNDER THE TONGUE EVERY 5 MINS AS NEEDED FOR CHEST PAIN (IF HAVE PAIN AFTER 3 PILLS- CALL 911). 04/19/21   Shelva Majestic, MD  ondansetron (ZOFRAN) 4 MG tablet Take 1 tablet (4 mg total) by mouth every 6 (six) hours. 12/03/21   Curatolo,  Adam, DO  ONETOUCH VERIO test strip USE TO TEST BLOOD SUGARS DAILY. DX: E11.9 01/22/21   Shelva Majestic, MD  oxyCODONE (ROXICODONE) 5 MG immediate release tablet Take 1 tablet (5 mg total) by mouth every 8 (eight) hours as needed for up to 10 doses for severe pain. 12/03/21   Curatolo, Adam, DO  oxyCODONE (ROXICODONE) 5 MG immediate release tablet Take 1 tablet (5 mg total) by mouth every 8 (eight) hours as needed for severe pain. 12/06/21   Sloan Leiter, DO  pravastatin (PRAVACHOL) 40 MG tablet Take 1 tablet (40 mg total) by mouth daily. 01/14/22   Shelva Majestic, MD  SYNTHROID 50 MCG tablet TAKE 1 TABLET DAILY  BEFORE BREAKFAST. 05/05/21   Shelva Majestic, MD  traMADol (ULTRAM) 50 MG tablet Take 1 tablet (50 mg total) by mouth every 6 (six) hours as needed. 01/02/22   Marlyne Beards, MD  valsartan (DIOVAN) 160 MG tablet TAKE 1 TABLET BY MOUTH EVERY DAY 03/05/22   Shelva Majestic, MD      Allergies    Codeine and Sulfonamide derivatives    Review of Systems   Review of Systems  Cardiovascular:  Positive for chest pain.  All other systems reviewed and are negative.   Physical Exam Updated Vital Signs BP (!) 131/97 (BP Location: Right Arm)   Pulse 88   Temp 99 F (37.2 C) (Oral)   Resp (!) 22   SpO2 98%  Physical Exam Vitals and nursing note reviewed.   88 year old female, resting comfortably and in no acute distress. Vital signs are significant for elevated blood pressure and respiratory rate. Oxygen saturation is 98%, which is normal. Head is normocephalic and atraumatic. PERRLA, EOMI. Oropharynx is clear. Neck is nontender and supple without adenopathy or JVD. Back is nontender and there is no CVA tenderness. Lungs are clear without rales, wheezes, or rhonchi. Chest is nontender. Heart has an irregular rhythm without murmur. Abdomen is soft, flat, nontender. Extremities have no cyanosis or edema, full range of motion is present. Skin is warm and dry without rash. Neurologic: Mental status is normal, cranial nerves are intact, moves all extremities equally.  ED Results / Procedures / Treatments   Labs (all labs ordered are listed, but only abnormal results are displayed) Labs Reviewed - No data to display  EKG EKG Interpretation  Date/Time:  Friday 06/09/22 22:58:03 EDT Ventricular Rate:  85 PR Interval:    QRS Duration: 151 QT Interval:  435 QTC Calculation: 518 R Axis:   -78 Text Interpretation: Atrial fibrillation Nonspecific IVCD with LAD LVH with secondary repolarization abnormality Inferior infarct, old Anterior infarct, old When compared with ECG of  12/03/2021, No significant change was found Confirmed by Dione Booze ([id number]) on 2022/06/09 11:04:52 PM  Radiology No results found.  Procedures Procedures  Cardiac monitor shows atrial fibrillation with controlled ventricular response, per my interpretation.  Medications Ordered in ED Medications - No data to display  ED Course/ Medical Decision Making/ A&P                           Medical Decision Making  Chest pain concerning for ACS.  Other diagnostic possibilities include pulmonary embolism, aortic dissection, acute bronchitis, GERD, musculoskeletal pain.  I have reviewed and interpreted her ECG and my interpretation is atrial fibrillation with left bundle branch block, unchanged from prior.  Old records are reviewed, and she had a cardiac catheterization on  09/16/2017 with which showed 70% stenosis of the first marginal artery, 45% stenosis of proximal LAD, 100% occlusion of the right coronary artery ostium with collateral circulation present.  ECG does not show changes of acute infarction or ischemia, but these can be difficult to see in the setting of left bundle branch block.  She is currently pain-free, I have ordered laboratory work-up including CBC, basic metabolic panel, troponin x2.  I have also ordered a chest x-ray to look for evidence of pneumonia.  In the setting of chronic anticoagulation, I do not feel she needs evaluation for pulmonary embolism.  Pain being completely resolved with nitroglycerin, I do not feel she needs evaluation for aortic dissection.  {Document critical care time when appropriate:1} {Document review of labs and clinical decision tools ie heart score, Chads2Vasc2 etc:1}  {Document your independent review of radiology images, and any outside records:1} {Document your discussion with family members, caretakers, and with consultants:1} {Document social determinants of health affecting pt's care:1} {Document your decision making why or why not admission,  treatments were needed:1} Final Clinical Impression(s) / ED Diagnoses Final diagnoses:  None    Rx / DC Orders ED Discharge Orders     None

## 2022-05-23 NOTE — Telephone Encounter (Signed)
Raiford Noble pt's son called back, told him I was calling about pt having chest pain. Raiford Noble said that was 4-5 days ago. Asked him if he spoke to Triage? He said yes they did not tell me anything except make an appt. Asked him if pt is having Chest pain right now? He said no, had some 4-5 days ago and he gave her tums checked on her 15-20 minutes later and she felt better. Told him okay, I see she has an appt with Dr. Durene Cal on 7/24, but if she develops Chest pain again you need to take her to ED or Urgent care to be evaluated. Raiford Noble verbalized understanding and said can I just call 911? Told him yes, that would be fine. Rick verbalized understanding.

## 2022-05-23 NOTE — Telephone Encounter (Signed)
Patient Name: Victoria Gomez INS Gender: Female DOB: [birth year] Age: 88 Y 5 M 20 D Return Phone Number: [phone] (Primary) Address: City/ State/ Zip: [place]  [address] Client [facility] Provider Tana Conch- MD Contact Type Call Who Is Calling Patient / Member / Family / Caregiver Call Type Triage / Clinical Caller Name Raiford Noble Relationship To Patient Son Return Phone Number [phone] (Primary) Chief Complaint CHEST PAIN - pain, pressure, heaviness or tightness Reason for Call Symptomatic / Request for Health Information Initial Comment Pt fell and fx her wrist in January and missed her f/u appt. DENIES new/worsening s/s and wants to make appt only. Translation No Nurse Assessment Nurse: Annye English, RN, Denise Date/Time (Eastern Time): 06/02/2022 3:54:01 PM Confirm and document reason for call. If symptomatic, describe symptoms. ---Pt fell and fx her wrist in January and missed her f/ u appt. DENIES new/worsening s/s and wants to make appt only. Does the patient have any new or worsening symptoms? ---No Please document clinical information provided and list any resource used. ---Trans to [facility] for appt Disp. Time Lamount Cohen Time) Disposition Final User 06/02/2022 3:49:11 PM Send to Urgent Franchot Gallo 06-02-22 3:54:58 PM Clinical Call Yes Annye English, RN, Angelique Blonder Final Disposition 06-02-2022 3:54:58 PM Clinical Call Yes Annye English, RN, Angelique Blonder

## 2022-05-23 NOTE — Telephone Encounter (Signed)
Caller states pt is experiencing chest pains and would like to schedule an annual check up with PCP.  Caller referred to PCP triage nurse, warm transferred to: [facility]

## 2022-05-23 NOTE — ED Notes (Signed)
X-ray at bedside

## 2022-05-23 NOTE — Telephone Encounter (Signed)
Left message on son Rick's voicemail [phone]. If calls back Pt needs to go to ED or Urgent care for to be evaluated for Chest pain.

## 2022-05-24 ENCOUNTER — Inpatient Hospital Stay (HOSPITAL_COMMUNITY): Payer: Medicare Other

## 2022-05-24 DIAGNOSIS — Z79890 Hormone replacement therapy (postmenopausal): Secondary | ICD-10-CM | POA: Diagnosis not present

## 2022-05-24 DIAGNOSIS — E039 Hypothyroidism, unspecified: Secondary | ICD-10-CM | POA: Diagnosis present

## 2022-05-24 DIAGNOSIS — I2109 ST elevation (STEMI) myocardial infarction involving other coronary artery of anterior wall: Secondary | ICD-10-CM | POA: Diagnosis not present

## 2022-05-24 DIAGNOSIS — Z7189 Other specified counseling: Secondary | ICD-10-CM

## 2022-05-24 DIAGNOSIS — I50814 Right heart failure due to left heart failure: Secondary | ICD-10-CM | POA: Diagnosis not present

## 2022-05-24 DIAGNOSIS — N1831 Chronic kidney disease, stage 3a: Secondary | ICD-10-CM | POA: Diagnosis present

## 2022-05-24 DIAGNOSIS — I482 Chronic atrial fibrillation, unspecified: Secondary | ICD-10-CM | POA: Diagnosis not present

## 2022-05-24 DIAGNOSIS — E785 Hyperlipidemia, unspecified: Secondary | ICD-10-CM | POA: Diagnosis present

## 2022-05-24 DIAGNOSIS — E1122 Type 2 diabetes mellitus with diabetic chronic kidney disease: Secondary | ICD-10-CM | POA: Diagnosis present

## 2022-05-24 DIAGNOSIS — D649 Anemia, unspecified: Secondary | ICD-10-CM | POA: Diagnosis present

## 2022-05-24 DIAGNOSIS — I214 Non-ST elevation (NSTEMI) myocardial infarction: Secondary | ICD-10-CM | POA: Diagnosis present

## 2022-05-24 DIAGNOSIS — I083 Combined rheumatic disorders of mitral, aortic and tricuspid valves: Secondary | ICD-10-CM | POA: Diagnosis present

## 2022-05-24 DIAGNOSIS — I447 Left bundle-branch block, unspecified: Secondary | ICD-10-CM | POA: Diagnosis present

## 2022-05-24 DIAGNOSIS — Z515 Encounter for palliative care: Secondary | ICD-10-CM

## 2022-05-24 DIAGNOSIS — I130 Hypertensive heart and chronic kidney disease with heart failure and stage 1 through stage 4 chronic kidney disease, or unspecified chronic kidney disease: Secondary | ICD-10-CM | POA: Diagnosis present

## 2022-05-24 DIAGNOSIS — Z79899 Other long term (current) drug therapy: Secondary | ICD-10-CM | POA: Diagnosis not present

## 2022-05-24 DIAGNOSIS — M810 Age-related osteoporosis without current pathological fracture: Secondary | ICD-10-CM | POA: Diagnosis present

## 2022-05-24 DIAGNOSIS — K219 Gastro-esophageal reflux disease without esophagitis: Secondary | ICD-10-CM | POA: Diagnosis present

## 2022-05-24 DIAGNOSIS — I4821 Permanent atrial fibrillation: Secondary | ICD-10-CM | POA: Diagnosis present

## 2022-05-24 DIAGNOSIS — Z955 Presence of coronary angioplasty implant and graft: Secondary | ICD-10-CM | POA: Diagnosis not present

## 2022-05-24 DIAGNOSIS — E876 Hypokalemia: Secondary | ICD-10-CM | POA: Diagnosis present

## 2022-05-24 DIAGNOSIS — I77810 Thoracic aortic ectasia: Secondary | ICD-10-CM | POA: Diagnosis present

## 2022-05-24 DIAGNOSIS — Z66 Do not resuscitate: Secondary | ICD-10-CM | POA: Diagnosis present

## 2022-05-24 DIAGNOSIS — I5043 Acute on chronic combined systolic (congestive) and diastolic (congestive) heart failure: Secondary | ICD-10-CM | POA: Diagnosis present

## 2022-05-24 DIAGNOSIS — I25119 Atherosclerotic heart disease of native coronary artery with unspecified angina pectoris: Secondary | ICD-10-CM | POA: Diagnosis present

## 2022-05-24 DIAGNOSIS — Z7901 Long term (current) use of anticoagulants: Secondary | ICD-10-CM | POA: Diagnosis not present

## 2022-05-24 DIAGNOSIS — R570 Cardiogenic shock: Secondary | ICD-10-CM | POA: Diagnosis present

## 2022-05-24 DIAGNOSIS — I3139 Other pericardial effusion (noninflammatory): Secondary | ICD-10-CM | POA: Diagnosis present

## 2022-05-24 DIAGNOSIS — I5082 Biventricular heart failure: Secondary | ICD-10-CM | POA: Diagnosis present

## 2022-05-24 LAB — CBC
HCT: 35.4 % — ABNORMAL LOW (ref 36.0–46.0)
Hemoglobin: 11.2 g/dL — ABNORMAL LOW (ref 12.0–15.0)
MCH: 29.9 pg (ref 26.0–34.0)
MCHC: 31.6 g/dL (ref 30.0–36.0)
MCV: 94.4 fL (ref 80.0–100.0)
Platelets: 212 10*3/uL (ref 150–400)
RBC: 3.75 MIL/uL — ABNORMAL LOW (ref 3.87–5.11)
RDW: 13.4 % (ref 11.5–15.5)
WBC: 7.3 10*3/uL (ref 4.0–10.5)
nRBC: 0 % (ref 0.0–0.2)

## 2022-05-24 LAB — ECHOCARDIOGRAM COMPLETE
AR max vel: 1.35 cm2
AV Area VTI: 1.19 cm2
AV Area mean vel: 1.2 cm2
AV Mean grad: 2 mmHg
AV Peak grad: 4.4 mmHg
Ao pk vel: 1.05 m/s
Area-P 1/2: 10.25 cm2
Calc EF: 22 %
P 1/2 time: 455 msec
S' Lateral: 4.2 cm
Single Plane A2C EF: 30.5 %
Single Plane A4C EF: 10.6 %

## 2022-05-24 LAB — BASIC METABOLIC PANEL
Anion gap: 12 (ref 5–15)
Anion gap: 14 (ref 5–15)
Anion gap: 14 (ref 5–15)
BUN: 22 mg/dL (ref 8–23)
BUN: 25 mg/dL — ABNORMAL HIGH (ref 8–23)
BUN: 28 mg/dL — ABNORMAL HIGH (ref 8–23)
CO2: 24 mmol/L (ref 22–32)
CO2: 22 mmol/L (ref 22–32)
CO2: 22 mmol/L (ref 22–32)
Calcium: 8.5 mg/dL — ABNORMAL LOW (ref 8.9–10.3)
Calcium: 8.8 mg/dL — ABNORMAL LOW (ref 8.9–10.3)
Calcium: 8.8 mg/dL — ABNORMAL LOW (ref 8.9–10.3)
Chloride: 107 mmol/L (ref 98–111)
Chloride: 107 mmol/L (ref 98–111)
Chloride: 106 mmol/L (ref 98–111)
Creatinine, Ser: 1.16 mg/dL — ABNORMAL HIGH (ref 0.44–1.00)
Creatinine, Ser: 1.51 mg/dL — ABNORMAL HIGH (ref 0.44–1.00)
Creatinine, Ser: 1.58 mg/dL — ABNORMAL HIGH (ref 0.44–1.00)
GFR, Estimated: 43 mL/min — ABNORMAL LOW (ref >60)
GFR, Estimated: 32 mL/min — ABNORMAL LOW (ref >60)
GFR, Estimated: 30 mL/min — ABNORMAL LOW (ref >60)
Glucose, Bld: 129 mg/dL — ABNORMAL HIGH (ref 70–99)
Glucose, Bld: 206 mg/dL — ABNORMAL HIGH (ref 70–99)
Glucose, Bld: 175 mg/dL — ABNORMAL HIGH (ref 70–99)
Potassium: 3.3 mmol/L — ABNORMAL LOW (ref 3.5–5.1)
Potassium: 4.6 mmol/L (ref 3.5–5.1)
Potassium: 5.1 mmol/L (ref 3.5–5.1)
Sodium: 143 mmol/L (ref 135–145)
Sodium: 143 mmol/L (ref 135–145)
Sodium: 142 mmol/L (ref 135–145)

## 2022-05-24 LAB — HEPARIN LEVEL (UNFRACTIONATED): Heparin Unfractionated: 0.61 IU/mL (ref 0.30–0.70)

## 2022-05-24 LAB — TROPONIN I (HIGH SENSITIVITY): Troponin I (High Sensitivity): 1350 ng/L (ref <18)

## 2022-05-24 LAB — MAGNESIUM
Magnesium: 1.9 mg/dL (ref 1.7–2.4)
Magnesium: 2 mg/dL (ref 1.7–2.4)

## 2022-05-24 LAB — IRON AND TIBC
Iron: 52 ug/dL (ref 28–170)
Saturation Ratios: 18 % (ref 10.4–31.8)
TIBC: 288 ug/dL (ref 250–450)
UIBC: 236 ug/dL

## 2022-05-24 LAB — RETICULOCYTES
Immature Retic Fract: 10.2 % (ref 2.3–15.9)
RBC.: 3.69 MIL/uL — ABNORMAL LOW (ref 3.87–5.11)
Retic Count, Absolute: 82.3 10*3/uL (ref 19.0–186.0)
Retic Ct Pct: 2.2 % (ref 0.4–3.1)

## 2022-05-24 LAB — PROTIME-INR
INR: 1.3 — ABNORMAL HIGH (ref 0.8–1.2)
Prothrombin Time: 15.6 seconds — ABNORMAL HIGH (ref 11.4–15.2)

## 2022-05-24 LAB — LIPID PANEL
Cholesterol: 144 mg/dL (ref 0–200)
HDL: 40 mg/dL — ABNORMAL LOW (ref >40)
LDL Cholesterol: 87 mg/dL (ref 0–99)
Total CHOL/HDL Ratio: 3.6 RATIO
Triglycerides: 86 mg/dL (ref <150)
VLDL: 17 mg/dL (ref 0–40)

## 2022-05-24 LAB — LACTIC ACID, PLASMA
Lactic Acid, Venous: 4.5 mmol/L (ref 0.5–1.9)
Lactic Acid, Venous: 2.9 mmol/L (ref 0.5–1.9)

## 2022-05-24 LAB — HEMOGLOBIN A1C
Hgb A1c MFr Bld: 6.1 % — ABNORMAL HIGH (ref 4.8–5.6)
Mean Plasma Glucose: 128.37 mg/dL

## 2022-05-24 LAB — APTT: aPTT: 34 seconds (ref 24–36)

## 2022-05-24 LAB — GLUCOSE, CAPILLARY
Glucose-Capillary: 144 mg/dL — ABNORMAL HIGH (ref 70–99)
Glucose-Capillary: 135 mg/dL — ABNORMAL HIGH (ref 70–99)
Glucose-Capillary: 175 mg/dL — ABNORMAL HIGH (ref 70–99)

## 2022-05-24 LAB — BRAIN NATRIURETIC PEPTIDE: B Natriuretic Peptide: 1666.5 pg/mL — ABNORMAL HIGH (ref 0.0–100.0)

## 2022-05-24 LAB — TSH: TSH: 2.14 u[IU]/mL (ref 0.350–4.500)

## 2022-05-24 LAB — TYPE AND SCREEN
ABO/RH(D): O POS
Antibody Screen: NEGATIVE

## 2022-05-24 LAB — FERRITIN: Ferritin: 619 ng/mL — ABNORMAL HIGH (ref 11–307)

## 2022-05-24 LAB — CBG MONITORING, ED: Glucose-Capillary: 149 mg/dL — ABNORMAL HIGH (ref 70–99)

## 2022-05-24 MED ORDER — ASCORBIC ACID 500 MG PO TABS
500.0000 mg | ORAL_TABLET | Freq: Every day | ORAL | Status: DC
  Administered 2022-05-24: 500 mg via ORAL
  Filled 2022-05-24: qty 1

## 2022-05-24 MED ORDER — POTASSIUM CHLORIDE CRYS ER 20 MEQ PO TBCR: 40.0000 meq | EXTENDED_RELEASE_TABLET | Freq: Two times a day (BID) | ORAL | Status: DC

## 2022-05-24 MED ORDER — ACETAMINOPHEN 325 MG PO TABS: 650.0000 mg | ORAL_TABLET | ORAL | Status: DC | PRN

## 2022-05-24 MED ORDER — METOPROLOL TARTRATE 12.5 MG HALF TABLET
12.5000 mg | ORAL_TABLET | Freq: Two times a day (BID) | ORAL | Status: DC
  Administered 2022-05-24 (×2): 12.5 mg via ORAL
  Filled 2022-05-24 (×2): qty 1

## 2022-05-24 MED ORDER — MORPHINE SULFATE (PF) 4 MG/ML IV SOLN
4.0000 mg | INTRAVENOUS | Status: AC | PRN
  Administered 2022-05-24 (×2): 4 mg via INTRAVENOUS
  Filled 2022-05-24 (×2): qty 1

## 2022-05-24 MED ORDER — LEVOTHYROXINE SODIUM 50 MCG PO TABS
50.0000 ug | ORAL_TABLET | Freq: Every day | ORAL | Status: DC
  Administered 2022-05-24: 50 ug via ORAL
  Filled 2022-05-24: qty 2

## 2022-05-24 MED ORDER — NITROGLYCERIN 0.4 MG SL SUBL
SUBLINGUAL_TABLET | SUBLINGUAL | Status: AC
  Filled 2022-05-24: qty 1

## 2022-05-24 MED ORDER — ONDANSETRON HCL 4 MG/2ML IJ SOLN
4.0000 mg | Freq: Four times a day (QID) | INTRAMUSCULAR | Status: DC | PRN
  Administered 2022-05-24: 4 mg via INTRAVENOUS
  Filled 2022-05-24: qty 2

## 2022-05-24 MED ORDER — IRBESARTAN 300 MG PO TABS: 150.0000 mg | ORAL_TABLET | Freq: Every day | ORAL | Status: DC

## 2022-05-24 MED ORDER — NITROGLYCERIN IN D5W 200-5 MCG/ML-% IV SOLN
0.0000 ug/min | INTRAVENOUS | Status: DC
  Administered 2022-05-24: 5 ug/min via INTRAVENOUS
  Filled 2022-05-24: qty 250

## 2022-05-24 MED ORDER — PANTOPRAZOLE SODIUM 20 MG PO TBEC
20.0000 mg | DELAYED_RELEASE_TABLET | Freq: Every day | ORAL | Status: DC
  Administered 2022-05-24: 20 mg via ORAL
  Filled 2022-05-24 (×2): qty 1

## 2022-05-24 MED ORDER — DOBUTAMINE IN D5W 4-5 MG/ML-% IV SOLN
5.0000 ug/kg/min | INTRAVENOUS | Status: DC
  Administered 2022-05-24: 2.5 ug/kg/min via INTRAVENOUS
  Filled 2022-05-24: qty 250

## 2022-05-24 MED ORDER — NITROGLYCERIN 2 % TD OINT: 0.5000 [in_us] | TOPICAL_OINTMENT | Freq: Once | TRANSDERMAL | Status: DC

## 2022-05-24 MED ORDER — IRBESARTAN 75 MG PO TABS
75.0000 mg | ORAL_TABLET | Freq: Every day | ORAL | Status: DC
  Filled 2022-05-24: qty 1

## 2022-05-24 MED ORDER — ROSUVASTATIN CALCIUM 20 MG PO TABS
20.0000 mg | ORAL_TABLET | Freq: Every day | ORAL | Status: DC
  Administered 2022-05-24: 20 mg via ORAL
  Filled 2022-05-24 (×2): qty 1

## 2022-05-24 MED ORDER — LORATADINE 10 MG PO TABS
10.0000 mg | ORAL_TABLET | Freq: Every day | ORAL | Status: DC
  Administered 2022-05-24: 10 mg via ORAL
  Filled 2022-05-24: qty 1

## 2022-05-24 MED ORDER — HEPARIN (PORCINE) 25000 UT/250ML-% IV SOLN
900.0000 [IU]/h | INTRAVENOUS | Status: DC
  Administered 2022-05-24: 750 [IU]/h via INTRAVENOUS
  Filled 2022-05-24: qty 250

## 2022-05-24 MED ORDER — ALBUTEROL SULFATE HFA 108 (90 BASE) MCG/ACT IN AERS
2.0000 | INHALATION_SPRAY | Freq: Four times a day (QID) | RESPIRATORY_TRACT | Status: DC | PRN
Start: 2022-05-24 — End: 2022-05-24

## 2022-05-24 MED ORDER — FUROSEMIDE 10 MG/ML IJ SOLN
40.0000 mg | Freq: Once | INTRAMUSCULAR | Status: AC
  Administered 2022-05-24: 40 mg via INTRAVENOUS
  Filled 2022-05-24: qty 4

## 2022-05-24 MED ORDER — ALBUTEROL SULFATE (2.5 MG/3ML) 0.083% IN NEBU: 2.5000 mg | INHALATION_SOLUTION | Freq: Four times a day (QID) | RESPIRATORY_TRACT | Status: DC | PRN

## 2022-05-24 MED ORDER — NITROGLYCERIN 0.4 MG SL SUBL
0.4000 mg | SUBLINGUAL_TABLET | SUBLINGUAL | Status: DC | PRN
  Administered 2022-05-24 (×2): 0.4 mg via SUBLINGUAL
  Filled 2022-05-24: qty 1

## 2022-05-24 MED ORDER — ASPIRIN 81 MG PO TBEC
81.0000 mg | DELAYED_RELEASE_TABLET | Freq: Every day | ORAL | Status: DC
  Administered 2022-05-24: 81 mg via ORAL
  Filled 2022-05-24: qty 1

## 2022-05-24 MED ORDER — POTASSIUM CHLORIDE CRYS ER 20 MEQ PO TBCR
40.0000 meq | EXTENDED_RELEASE_TABLET | Freq: Once | ORAL | Status: AC
  Administered 2022-05-24: 40 meq via ORAL
  Filled 2022-05-24: qty 2

## 2022-05-24 MED ORDER — FLUTICASONE PROPIONATE 50 MCG/ACT NA SUSP
1.0000 | Freq: Every day | NASAL | Status: DC
  Administered 2022-05-24: 1 via NASAL
  Filled 2022-05-24 (×2): qty 16

## 2022-05-24 MED ORDER — VITAMIN D 25 MCG (1000 UNIT) PO TABS
2000.0000 [IU] | ORAL_TABLET | Freq: Every day | ORAL | Status: DC
Start: 2022-05-24 — End: 2022-05-25
  Administered 2022-05-24: 2000 [IU] via ORAL
  Filled 2022-05-24: qty 2

## 2022-05-24 MED ORDER — FUROSEMIDE 10 MG/ML IJ SOLN
40.0000 mg | Freq: Once | INTRAMUSCULAR | Status: AC
Start: 2022-05-24 — End: 2022-05-24
  Administered 2022-05-24: 40 mg via INTRAVENOUS
  Filled 2022-05-24: qty 4

## 2022-05-24 MED ORDER — MELATONIN 3 MG PO TABS
3.0000 mg | ORAL_TABLET | Freq: Every day | ORAL | Status: DC
  Administered 2022-05-24: 3 mg via ORAL
  Filled 2022-05-24 (×2): qty 1

## 2022-05-24 MED ORDER — INSULIN ASPART 100 UNIT/ML IJ SOLN: 0.0000 [IU] | Freq: Three times a day (TID) | INTRAMUSCULAR | Status: DC

## 2022-05-24 MED ORDER — POTASSIUM CHLORIDE CRYS ER 20 MEQ PO TBCR
40.0000 meq | EXTENDED_RELEASE_TABLET | Freq: Two times a day (BID) | ORAL | Status: DC
  Administered 2022-05-24: 40 meq via ORAL
  Filled 2022-05-24: qty 2

## 2022-05-24 MED ORDER — POTASSIUM CHLORIDE CRYS ER 20 MEQ PO TBCR: 40.0000 meq | EXTENDED_RELEASE_TABLET | Freq: Once | ORAL | Status: DC

## 2022-05-24 MED ORDER — NITROGLYCERIN 2 % TD OINT
1.0000 [in_us] | TOPICAL_OINTMENT | Freq: Once | TRANSDERMAL | Status: AC
  Administered 2022-05-24: 1 [in_us] via TOPICAL
  Filled 2022-05-24: qty 1

## 2022-05-24 MED ORDER — FUROSEMIDE 20 MG PO TABS: 60.0000 mg | ORAL_TABLET | Freq: Every day | ORAL | Status: DC

## 2022-05-24 MED ORDER — NITROGLYCERIN 2 % TD OINT: 1.0000 [in_us] | TOPICAL_OINTMENT | Freq: Four times a day (QID) | TRANSDERMAL | Status: DC

## 2022-05-24 NOTE — ED Notes (Signed)
Patient cc of worsening chest pain, nitro patch not helping. Cardiologist made aware. Awaiting new orders.

## 2022-05-24 NOTE — ED Notes (Signed)
Cardiologist at bedside.  

## 2022-05-24 NOTE — Progress Notes (Addendum)
Progress Note  Patient Name: Victoria Gomez Date of Encounter: 05/24/2022  [facility] Cardiologist: Dr. Antoine Poche  Subjective   Patient was admitted overnight for NSTEMI after presenting with chest pain. Patient tells me she has been having epigastric/chest pain that radiates to left side intermittent for about a week. These symptoms started after a fall at home. She states she was standing talking to her son last week when her legs when numb and she loss consciousness and then fell hitting her hip on her son's dresser. She then fell to the ground. She denies any significant hip pain but states she has had intermittent epigastric/chest pain since then.  There is a Charity fundraiser note from this morning at 8:39am in the ED that patient suddenly started crying in pain in her chest and stating "I'm going to die." Repeat EKG showed no significant changes compared to prior tracing.I saw patient when she arrived to the floor from the ED and she is resting comfortably and does not appear to be in any acute distress. I asked her about this and it sounds like patient just go overwhelmed with everything. Patient is still having some epigastric/chest pain that she ranks as about a 6-7/10. After leaving the room, I was called back in by the RN because she had a small about of emesis that looked green. Patients states she is just feeling sick. She is cold on exam. I am concerned for cardiogenic shock.  Inpatient Medications    Scheduled Meds:  ascorbic acid  500 mg Oral Daily   aspirin EC  81 mg Oral Daily   cholecalciferol  2,000 Units Oral Daily   fluticasone  1-2 spray Each Nare Daily   furosemide  40 mg Intravenous Once   insulin aspart  0-4 Units Subcutaneous TID WC   levothyroxine  50 mcg Oral QAC breakfast   loratadine  10 mg Oral Daily   melatonin  3 mg Oral QHS   metoprolol tartrate  12.5 mg Oral BID   pantoprazole  20 mg Oral Daily   rosuvastatin  20 mg Oral Daily   Continuous Infusions:  heparin  750 Units/hr (05/24/22 1937)   nitroGLYCERIN 2 mcg/min (05/24/22 0912)   PRN Meds: acetaminophen, albuterol, ondansetron (ZOFRAN) IV   Vital Signs    Vitals:   05/24/22 0800 05/24/22 0915 05/24/22 0917 05/24/22 0921  BP: 94/64 114/85    Pulse: 85 80    Resp: (!) 23 (!) 31    Temp:   98.6 F (37 C) 97.8 F (36.6 C)  TempSrc:   Oral Oral  SpO2: 100% 100%     No intake or output data in the 24 hours ending 05/24/22 0928    12/12/2021    1:47 PM 12/06/2021    3:21 AM 12/03/2021    3:31 PM  Last 3 Weights  Weight (lbs) 142 lb 142 lb 13.7 oz 143 lb  Weight (kg) 64.411 kg 64.8 kg 64.864 kg      Telemetry    Atrial fibrillation with rate in the 90s.- Personally Reviewed  ECG    Atrial fibrillation, rate 93 bpm, with Q waves in inferior and anterior leads. - Personally Reviewed  Physical Exam   GEN: No acute distress. Somnolent at times. Neck: No JVD. Cardiac: Irregularly irregular rhythm with normal rate. No murmurs, rubs, or gallops.  Respiratory: No increased work of breathing. Decreased breath sounds in bilateral bases with faint crackles. GI: Soft, non-distended, and non-tender. MS: No lower extremity edema. No  deformity. Skin: Upper and lower extremities are cold. Neuro:  No focal deficits. Somnolent at times. Psych: Normal affect  Labs    High Sensitivity Troponin:   Recent Labs  Lab 05/19/2022 2301 05/24/22 0211  TROPONINIHS 1,356* 1,350*     Chemistry Recent Labs  Lab 05/18/2022 2301 05/24/22 0211  NA 143 143  K 2.7* 3.3*  CL 109 107  CO2 23 24  GLUCOSE 122* 129*  BUN 21 22  CREATININE 1.06* 1.16*  CALCIUM 7.9* 8.5*  MG 1.9 2.0  GFRNONAA 48* 43*  ANIONGAP 11 12    Lipids  Recent Labs  Lab 05/24/22 0211  CHOL 144  TRIG 86  HDL 40*  LDLCALC 87  CHOLHDL 3.6    Hematology Recent Labs  Lab 05/17/2022 2301 05/24/22 0211  WBC 6.0 7.3  RBC 3.56* 3.75*  3.69*  HGB 10.6* 11.2*  HCT 32.9* 35.4*  MCV 92.4 94.4  MCH 29.8 29.9  MCHC 32.2 31.6   RDW 13.4 13.4  PLT 203 212   Thyroid No results for input(s): "TSH", "FREET4" in the last 168 hours.  BNP Recent Labs  Lab 05/24/22 0211  BNP 1,666.5*    DDimer No results for input(s): "DDIMER" in the last 168 hours.   Radiology    DG Chest Port 1 View  Result Date: 05/27/2022 CLINICAL DATA:  Worsening chest pain, cough EXAM: PORTABLE CHEST 1 VIEW COMPARISON:  12/03/2021 FINDINGS: Single frontal view of the chest demonstrates stable enlargement the cardiac silhouette. Increased central vascular congestion, with progressive bibasilar interstitial and ground-glass opacities consistent with mild edema. Small bilateral effusions. No pneumothorax. No acute bony abnormalities. IMPRESSION: 1. Mild congestive heart failure. Electronically Signed   By: Sharlet Salina M.D.   On: 05/22/2022 23:41    Cardiac Studies   Echo pending.  Patient Profile     88 y.o. female with a history of CAD s/p PCI to RCA, chronic CHF with mildly reduced EF of 40-45%, permanent atrial fibrillation on Eliquis, thoracic aortic aneurysm, hypertension, hyperlipidemia, type 2 diabetes mellitus, CKD stage III, and hypothyroidism who was admitted on 05/15/2022 with NSTEMI after presenting with chest pain.  Assessment & Plan    NSTEMI Patient presented with intermittent epigastric/chest pain for about the past week that started after a fall and syncopal episode. EKG shows atrail fibrillation with Q waves in inferior and anterior leads. High-sensitivity troponin 1,356 >> 1,350. I got called to bedside earlier this morning for persistent chest pain and low BP on IV Nitro. Patient reports ongoing chest pain pressure and nausea. She is somewhat somnolent at times on exam but is able to be aroused easily. She is cold on exam concerning for cardiogenic shock.  - Echo tech at bedside performing Echo. LVEF looks to be around 20% and anterior wall looks down. - Continue aspirin and statin. - It sounds like patient probably had a  heart attack about 1 week ago and now is in cardiogenic shock. Dr. Shari Prows had a long conversation with her son and patient about goals of care. Patient would like to be treated medically for now. We will continue IV Heparin and start IV Dobutamine 2.5 mcg/kg/min. Will stop IV Nitroglycerin. Will ask Palliative Care to come and see to help guide [place] on escalating care. Patient would like to be made DNR.   Cardiogenic Shock Acute on Chronic Systolic CHF LVEF 40-45% on last Echo in 2018. BNP elevated at 1,666.5. Chest x-ray consistent with mild CHF. Formal Echo read this admission is  pending but looks like EF is down to 20%. Patient is cold on exam and looks to be in cardiogenic shock from suspected cardiac event about 1 weeks ago. - BP soft with systolic BP in the 0000000.  - Ordered lactic acid. - Started on low Lopressor on admission. Will stop this given cardiogenic shock. - Continue hold home ARB and Imdur. Will stop IV Nitroglycerin. - Will start IV Dobumatine 2.52mcg/kg/min. - Prognosis is poor. Have consulted Palliative Care as above.  Permanent Atrial Fibrillation Rate controlled. - Will stop Lopressor due to cardiogenic shock. - Continue IV Heparin for now. On Eliquis at home.  Hyperlipidemia Lipid panel this admission: Total Cholesterol, Triglycerides 86, HDL 40, LDL 87.  - Continue Crestor 20mg  daily for now.  - Patient's prognosis is poor. If patient decides to proceed to comfort care, can stop statin.  Type 2 Diabetes Mellitus Not on any medications at home. Hemoglobin A1c 6.1 this admission. - Continue routine CBG and sliding scale insulin.  Hypokalemia Potassium 2.7 on admission. Repleted and improved to 3.3. - Will give additional potassium this morning.  For questions or updates, please contact [place] Please consult [email or web address] for contact info under        Signed, Darreld Mclean, PA-C  05/24/2022, 9:28 AM    Patient seen and examined and agree with  Sande Rives, PA-C as detailed above.  In brief, the patient is a 88 year old female with history of CAD s/p PCI to RCA, chronic CHF with mildly reduced EF of 40-45%, permanent atrial fibrillation on Eliquis, thoracic aortic aneurysm, hypertension, hyperlipidemia, type 2 diabetes mellitus, CKD stage III, and hypothyroidism who presented with chest pain found to have NSTEMI.  This morning, patient developed worsening chest discomfort and nausea. Blood pressure soft on low dose nitro. On bedside exam, she was cold and mottled with concern for cardiogenic shock. ECG with Afib with anterior and inferior q-waves. Bedside TTE with EF 20% with anterior and anteroseptal akinesis. Patient reports history of chest pain and syncope about 1 week ago with concern that this was the primary cardiac event and we are seeing a late presentation of STEMI. Case discussed with Dr. Irish Lack and given late presentation, advanced age, cardiogenic shock and comorbidities, there is concern that the patient will not do well overall and would unlikely benefit from cath and IABP placement. She is not a candidate for advance MCS. Long discussion held with the patient and her son detailing our concern for overall poor outcome regardless if invasive vs conservative approach pursued. The patient states that she is "ready to go with god" and would like to "proceed with medications at this time."  Will start low dose dobutamine given relative hypotension and start lasix for diuresis. Plan to have palliative care evaluate as well.   GEN: Elderly female, pale appearing  Neck: JVD to the mid-neck Cardiac: Irregularly irregular. No murmurs Respiratory: Bibasilar crackles with diminished breath sounds at bases GI: Soft, nontender, non-distended  MS: No edema; No deformity. Cold Skin: Cold on exam  Neuro:  Nonfocal  Psych: Normal affect    Plan: -Patient in cardiogenic shock with suspected late presentation of anterior MI -Given  advanced age, presence of shock, and medical comorbidities, the patient has overall poor prognosis and likely would not gain much benefit from an aggressive approach with cath and possible IABP placement; this was discussed with Dr. Irish Lack, the patient and her son -Will try to manage medically with temporary trial of dobutamine 2.5 (given  soft blood pressures) and diuresis -Will have palliative care evaluate as well -Code status changed to DNR after discussion with the patient  CRITICAL CARE TIME: I have spent a total of 60 minutes with patient reviewing hospital notes, telemetry, EKGs, labs and examining the patient as well as establishing an assessment and plan that was discussed with the patient.  > 50% of time was spent in direct patient care. The patient is critically ill with multi-organ system failure and requires high complexity decision making for assessment and support, frequent evaluation and titration of therapies, application of advanced monitoring technologies and extensive interpretation of multiple databases.   Gwyndolyn Kaufman, MD [place]

## 2022-05-24 NOTE — ED Notes (Signed)
Patient suddenly started crying in pain in her chest and stating "I'm going to die".  EKG captured and Cardiology paged.

## 2022-05-24 NOTE — Progress Notes (Signed)
Pt is having active chest pain, 12 lead EKG done, Dr Rosita Fire informed, 0.4 mg SL nitro one dose and 4mg  morphine IV given, pt is still moaning with pain, will continue to monitor   , RN

## 2022-05-24 NOTE — ED Notes (Signed)
Admitting paged to RN per her request 

## 2022-05-24 NOTE — H&P (Signed)
Cardiology Admission History and Physical:   Patient ID: [name] MRN: [id number]; DOB: August 25, 1925   Admission date: Jun 21, 2022  PCP:  Victoria Majestic, MD   [facility] Providers Cardiologist:  None        Chief Complaint:  Chest Pain  Patient Profile:   Victoria Gomez is a 88 y.o. 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  History of Present Illness:   Victoria Gomez reports that she was in her usual state of health until earlier yesterday when she developed sudden onset, left-sided chest pressure at rest.  She said that the pain was transient and went away on its own.  Later on that day however, the pain recurred, was more severe and radiated to her left arm.  She denies other associated symptoms including shortness of breath, nausea, vomiting, diaphoresis, or focal weakness.  She further denies fevers, chills, PND, orthopnea, swelling.  She says that she has never had chest pain this bad before.  Of note the patient was hospitalized in 2018 for acute on chronic respiratory failure in the setting of decompensated CHF.  She underwent LHC on 09/16/2017 which showed two-vessel obstructive CAD with a 100% ostial RCA with a 70% OM1.  Medical management was pursued at that time she has not been hospitalized since this event.  She denies tobacco, EtOH, illicit drug use.  In the ED, VS were afebrile, HR 88, BP 131/97, RR 22, satting 100% on RA.  Labs notable for troponin 1356, potassium 2.7, and creatinine 1.06.  EKG without ischemic changes and CXR with mild pulmonary edema.  On my examination the patient she had recurrence of her chest pain that is responsive to SLNGT and Nitropaste.  She was chest pain-free when I left the room.  She is admitted to cardiology for management of NSTEMI.   Past Medical History:  Diagnosis Date    Arthritis    Atrial fibrillation (HCC)    a. Dx 09/2013.   Bladder disorder    Bradycardia 08/2012   a. Eval for symptomatic bradycardia 08/2012 - beta blocker discontinued.   BREAST MASS, BENIGN    CAD (coronary artery disease)    a. s/p PCI to RCA '01. b. repeat PCI to RCA '03 2/2 ISR. c. cath 2012: RCA dz, rx medically.  Occluded RCA.  Nonobstructive disease elsewhere   Chronic combined systolic and diastolic CHF (congestive heart failure) (HCC)    a. EF 40-45% in 2014. b. EF reported to be normal in 03/2015 nuc.   CKD (chronic kidney disease), stage III (HCC)    DIVERTICULOSIS, COLON    Epistaxis    Esophageal reflux    History of breast lump/mass excision 05/23/2009   Qualifier: History of  By: Lovell Sheehan MD, Balinda Quails    HYPERLIPIDEMIA 03/07/2008   HYPERTENSION 08/03/2007   HYPOTHYROIDISM    Internal hemorrhoids    Interstitial cystitis    LBBB (left bundle branch block)    Orthostatic hypotension 08/2012   a. 08/2012 w/ syncope - meds adjusted.    Right hand fracture     Past Surgical History:  Procedure Laterality Date   ABDOMINAL HYSTERECTOMY     ANKLE RECONSTRUCTION     Left   APPENDECTOMY     BREAST SURGERY     benign mass   CARDIOVERSION N/A 10/20/2013   CARDIOVERSION N/A  04/24/2015   Procedure: CARDIOVERSION;  Surgeon: Dorothy Spark, MD;  Location: [facility];  Service: Cardiovascular;  Laterality: N/A;   CATARACT EXTRACTION, BILATERAL     CESAREAN SECTION     CHOLECYSTECTOMY     LEFT HEART CATH AND CORONARY ANGIOGRAPHY N/A 09/16/2017   Procedure: LEFT HEART CATH AND CORONARY ANGIOGRAPHY;  Surgeon: Martinique, Peter M, MD;  Location: [facility];  Service: Cardiovascular;  Laterality: N/A;   TONSILLECTOMY     WRIST RECONSTRUCTION     Right     Medications Prior to Admission: Prior to Admission medications   Medication Sig Start Date End Date Taking? Authorizing Provider  acetaminophen (TYLENOL) 500 MG tablet Take 1,000 mg by mouth every 8 (eight) hours as  needed.    [provider]  albuterol (VENTOLIN HFA) 108 (90 Base) MCG/ACT inhaler Inhale 2 puffs into the lungs every 6 (six) hours as needed for wheezing or shortness of breath. 06/26/21   Nche, Charlene Brooke, NP  apixaban (ELIQUIS) 2.5 MG TABS tablet Take 1 tablet (2.5 mg total) by mouth 2 (two) times daily. TAKE 1 TABLET TWICE A DAY 03/27/22   Minus Breeding, MD  ascorbic acid (VITAMIN C) 500 MG tablet Take 500 mg by mouth daily.    [provider]  benzonatate (TESSALON PERLES) 100 MG capsule Take 1 capsule (100 mg total) by mouth 3 (three) times daily as needed. 11/12/21   Marin Olp, MD  Cholecalciferol (D3) 50 MCG (2000 UT) TABS Take by mouth daily.     [provider]  Cyanocobalamin (B-12) 3000 MCG CAPS Take by mouth.    [provider]  fluticasone (FLONASE) 50 MCG/ACT nasal spray Place 1-2 sprays into both nostrils daily. 11/16/20   Wieters, Hallie C, PA-C  furosemide (LASIX) 20 MG tablet TAKE 3 TABLETS BY MOUTH EVERY DAY 06/27/21   Marin Olp, MD  isosorbide mononitrate (IMDUR) 120 MG 24 hr tablet TAKE 1 TABLET (120 MG TOTAL) BY MOUTH DAILY. NEED OV. 09/16/21   Minus Breeding, MD  KLOR-CON M10 10 MEQ tablet TAKE 1 TABLET BY MOUTH EVERY DAY 01/06/22   Marin Olp, MD  lansoprazole (PREVACID) 30 MG capsule Take 1 capsule (30 mg total) by mouth 2 (two) times daily before a meal. Schedule an appointment for further refills 03/27/22   Minus Breeding, MD  loratadine (CLARITIN) 10 MG tablet Take 1 tablet (10 mg total) by mouth daily. 11/16/20   Wieters, Hallie C, PA-C  nitroGLYCERIN (NITROSTAT) 0.4 MG SL tablet 1 TAB UNDER THE TONGUE EVERY 5 MINS AS NEEDED FOR CHEST PAIN (IF HAVE PAIN AFTER 3 PILLS- CALL 911). 04/19/21   Marin Olp, MD  ondansetron (ZOFRAN) 4 MG tablet Take 1 tablet (4 mg total) by mouth every 6 (six) hours. 12/03/21   Curatolo, Adam, DO  ONETOUCH VERIO test strip USE TO TEST BLOOD SUGARS DAILY. DX: E11.9 01/22/21   Marin Olp, MD  oxyCODONE (ROXICODONE) 5 MG immediate release tablet Take 1 tablet (5 mg total) by mouth every 8 (eight) hours as needed for up to 10 doses for severe pain. 12/03/21   Curatolo, Adam, DO  oxyCODONE (ROXICODONE) 5 MG immediate release tablet Take 1 tablet (5 mg total) by mouth every 8 (eight) hours as needed for severe pain. 12/06/21   Jeanell Sparrow, DO  SYNTHROID 50 MCG tablet TAKE 1 TABLET DAILY BEFORE BREAKFAST. 05/05/21   Marin Olp, MD  traMADol (ULTRAM) 50 MG tablet Take 1  tablet (50 mg total) by mouth every 6 (six) hours as needed. 01/02/22   Sherilyn Cooter, MD  valsartan (DIOVAN) 160 MG tablet TAKE 1 TABLET BY MOUTH EVERY DAY 03/05/22   Marin Olp, MD     Allergies:    Allergies  Allergen Reactions   Codeine Hives and Nausea And Vomiting   Sulfonamide Derivatives Swelling    Turns red    Social History:   Social History   Socioeconomic History   Marital status: Widowed    Spouse name: Not on file   Number of children: 3   Years of education: Not on file   Highest education level: Not on file  Occupational History   Occupation: retired  Tobacco Use   Smoking status: Former    Packs/day: 1.00    Years: 15.00    Total pack years: 15.00    Types: Cigarettes    Quit date: 11/10/1969    Years since quitting: 52.5   Smokeless tobacco: Never  Vaping Use   Vaping Use: Never used  Substance and Sexual Activity   Alcohol use: No    Alcohol/week: 0.0 standard drinks of alcohol   Drug use: No   Sexual activity: Never    Birth control/protection: Post-menopausal  Other Topics Concern   Not on file  Social History Narrative   Widowed 05/2013. 2nd marriage. First husband died of cancer. 3 sons-William Brooke Bonito- 2 sons, 2 twin girls, [name]-1 son, no grandsons. [name]-1 son, 1 daughter, no grandkids.       LIves with youngest son (has cancer). Son drives her but has license. Independent IADLs.       Retired from [employer] after 33 years.       Hobbies:  crocheting, used to bowl with husband, reading      Living will: currently wants resuscitation.    Social Determinants of Health   Financial Resource Strain: Not on file  Food Insecurity: No Food Insecurity (10/10/2020)   Hunger Vital Sign    Worried About Running Out of Food in the Last Year: Never true    Ran Out of Food in the Last Year: Never true  Transportation Needs: Not on file  Physical Activity: Not on file  Stress: Not on file  Social Connections: Not on file  Intimate Partner Violence: Not on file    Family History:   The patient's family history includes Colon cancer in her mother; Colon polyps in her sister; Coronary artery disease in her brother, father, mother, and sister; Diabetes type II in her son; Heart disease in her father and mother.    ROS:  Please see the history of present illness.  All other ROS reviewed and negative.     Physical Exam/Data:   Vitals:   06/07/2022 2345 05/24/22 0000 05/24/22 0015 05/24/22 0045  BP: (!) 136/56 129/87 126/75 116/82  Pulse: (!) 104 (!) 116 (!) 112 (!) 103  Resp: 18 (!) 25 (!) 8 19  Temp:      TempSrc:      SpO2: 94% 96% 96% 100%   No intake or output data in the 24 hours ending 05/24/22 0115    12/12/2021    1:47 PM 12/06/2021    3:21 AM 12/03/2021    3:31 PM  Last 3 Weights  Weight (lbs) 142 lb 142 lb 13.7 oz 143 lb  Weight (kg) 64.411 kg 64.8 kg 64.864 kg     There is no height or weight on file to calculate BMI.  General:  Well nourished, well developed, initially in moderate distress from chest pain but resolved with nitroglycerin HEENT: normal, EOMI, MMM Neck: no JVD Vascular: Distal pulses 2+ bilaterally   Cardiac:  normal S1, S2; RRR; no murmur, rubs or gallops Lungs:  clear to auscultation bilaterally, no wheezing, rhonchi or rales  Abd: soft, nontender, no hepatomegaly  Ext: no edema Musculoskeletal:  No deformities, BUE and BLE strength normal and equal Skin: warm and dry  Neuro:  CNs 2-12 intact, no  focal abnormalities noted Psych:  Normal affect    EKG:  The ECG that was done  was personally reviewed and demonstrates: Afib, LBBB    Relevant CV Studies:   LHC 09/16/17:  Prox LAD lesion is 45% stenosed. 1st Mrg lesion is 70% stenosed. Ost RCA to Prox RCA lesion is 100% stenosed.   1. 2 vessel obstructive CAD    - 100% ostial RCA with left to right collaterals. This is a CTO    - 70% OM1. 2. Normal LVEDP   TTE 09/14/17:  Study Conclusions   - Left ventricle: The cavity size was normal. Wall thickness was    increased in a pattern of moderate LVH. Systolic function was    mildly to moderately reduced. The estimated ejection fraction was    in the range of 40% to 45%. Mid to distal septal, inferior and    apical severe hypokinesis to akinesis. Doppler parameters are    consistent with pseudonormal left ventricular relaxation (grade 2    diastolic dysfunction). The E/e&' ratio is >15, suggesting    elevated LV filling pressure.  - Aortic valve: Sclerosis without stenosis. There was trivial    regurgitation.  - Mitral valve: Mildly thickened leaflets . There was trivial    regurgitation.  - Left atrium: The atrium was normal in size.  - Right ventricle: The cavity size was mildly dilated. Moderately    reduced systolic function.  - Tricuspid valve: There was trivial regurgitation.  - Pulmonary arteries: PA peak pressure: 15 mm Hg (S).  - Inferior vena cava: The vessel was normal in size. The    respirophasic diameter changes were in the normal range (>= 50%),    consistent with normal central venous pressure.   Impressions:   - Compared to a prior study in 2014, the LVEF is unchanged at    40-45% - there are regional wall motion abnormalities of the    inferior wall, distal septum and apex, LV filling pressure is    elevated.   Laboratory Data:  High Sensitivity Troponin:   Recent Labs  Lab 05/28/2022 2301  TROPONINIHS 1,356*      Chemistry Recent Labs  Lab  05/19/2022 2301  NA 143  K 2.7*  CL 109  CO2 23  GLUCOSE 122*  BUN 21  CREATININE 1.06*  CALCIUM 7.9*  MG 1.9  GFRNONAA 48*  ANIONGAP 11    No results for input(s): "PROT", "ALBUMIN", "AST", "ALT", "ALKPHOS", "BILITOT" in the last 168 hours. Lipids No results for input(s): "CHOL", "TRIG", "HDL", "LABVLDL", "LDLCALC", "CHOLHDL" in the last 168 hours. Hematology Recent Labs  Lab 05/11/2022 2301  WBC 6.0  RBC 3.56*  HGB 10.6*  HCT 32.9*  MCV 92.4  MCH 29.8  MCHC 32.2  RDW 13.4  PLT 203   Thyroid No results for input(s): "TSH", "FREET4" in the last 168 hours. BNPNo results for input(s): "BNP", "PROBNP" in the last 168 hours.  DDimer No results for input(s): "DDIMER" in the  last 168 hours.   Radiology/Studies:  DG Chest Port 1 View  Result Date: 05/18/2022 CLINICAL DATA:  Worsening chest pain, cough EXAM: PORTABLE CHEST 1 VIEW COMPARISON:  12/03/2021 FINDINGS: Single frontal view of the chest demonstrates stable enlargement the cardiac silhouette. Increased central vascular congestion, with progressive bibasilar interstitial and ground-glass opacities consistent with mild edema. Small bilateral effusions. No pneumothorax. No acute bony abnormalities. IMPRESSION: 1. Mild congestive heart failure. Electronically Signed   By: Randa Ngo M.D.   On: 05/28/2022 23:41     Assessment and Plan:   Victoria Gomez is a 88 y.o. 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  #NSTEMI, Suspected Type 1 #Obstructive CAD s/p PCI #HLD :: Patient presenting with cute onset left-sided chest pressure found to have markedly elevated troponins.  Her presentation is consistent with an NSTEMI.  The patient is amenable to [facility] and additional therapies.  We will start on systemic anticoagulation and discussed LHC with interventional cardiology. -Discussed  pursuing LHC with interventional cardiology -Start heparin per pharmacy dosed for ACS -s/p ASA load per EMS -Continue ASA 81 mg daily -Continue Nitropaste for angina relief -Start metoprolol tartrate 12.5 mg twice daily -Switch valsartan to irbesartan 75 mg daily (valsartan not on formulary) -Stop pravastatin, start rosuvastatin 20 mg daily -Check lipid panel, A1c, TSH, BNP -TTE  #Permanent Afib :: In atrial fibrillation currently.  On dose reduced apixaban 2.5 mg twice daily at home.  Mildly tachycardic but not symptomatic from her A-fib. -Hold apixaban 2.5 mg twice daily pending LHC -If she undergoes PCI would favor using Plavix for P2Y12 given long-term need for concomitant apixaban use -Telemetry  #HFmrEF (EF = 40-45%) :: Not in acute heart failure clinically. -TTE as above -Continue home Lasix 60 mg daily -Strict I's and O's -Daily weights  #HTN -Irbesartan, metoprolol as above  #CKD3 -Avoid nephrotoxic drugs  #Diet-controlled DM2 :: Last A1c perfectly controlled without the aid of medications.  Will start glucose monitoring inpatient with as needed SSI.  If her blood glucose levels are all within acceptable range then can likely discontinue POC glucose checks. -SSI + POC glucose checks -A1c as above  #Hypokalemia :: Potassium 2.7 on admission.  Unclear etiology but perhaps due to Lasix?  She takes potassium supplementation at home.  We will continue to monitor and replete. -Daily BMP -Holding home p.o. potassium supplementation in the lieu of targeted supplementation while inpatient -Continuing Lasix as above   Risk Assessment/Risk Scores:    TIMI Risk Score for Unstable Angina or Non-ST Elevation MI:   The patient's TIMI risk score is 6, which indicates a 41% risk of all cause mortality, new or recurrent myocardial infarction or need for urgent revascularization in the next 14 days.    CHA2DS2-VASc Score = 6  This indicates a 9.7% annual risk of stroke. The  patient's score is based upon: age, sex, CHF, HTN, diabetes       Severity of Illness: The appropriate patient status for this patient is INPATIENT. Inpatient status is judged to be reasonable and necessary in order to provide the required intensity of service to ensure the patient's safety. The patient's presenting symptoms, physical exam findings, and initial radiographic and laboratory data in the context of their chronic comorbidities is felt to place them at high risk for further clinical deterioration. Furthermore, it is not  anticipated that the patient will be medically stable for discharge from the hospital within 2 midnights of admission.   * I certify that at the point of admission it is my clinical judgment that the patient will require inpatient hospital care spanning beyond 2 midnights from the point of admission due to high intensity of service, high risk for further deterioration and high frequency of surveillance required.*   For questions or updates, please contact [facility] Please consult [email or web address] for contact info under     Signed, Karl Ito, MD  05/24/2022 1:15 AM

## 2022-05-24 NOTE — Progress Notes (Signed)
ANTICOAGULATION CONSULT NOTE - Initial Consult  Pharmacy Consult for Heparin (Apixaban on hold) Indication: chest pain/ACS and atrial fibrillation  Allergies  Allergen Reactions   Codeine Hives and Nausea And Vomiting   Sulfonamide Derivatives Swelling    Turns red    Vital Signs: Temp: 99 F (37.2 C) (07/14 2253) Temp Source: Oral (07/14 2253) BP: 102/60 (07/15 0400) Pulse Rate: 78 (07/15 0400)  Labs: Recent Labs    06/09/2022 2301 05/24/22 0211  HGB 10.6* 11.2*  HCT 32.9* 35.4*  PLT 203 212  LABPROT  --  15.6*  INR  --  1.3*  CREATININE 1.06* 1.16*  TROPONINIHS 1,356* 1,350*    CrCl cannot be calculated (Unknown ideal weight.).   Medical History: Past Medical History:  Diagnosis Date   Arthritis    Atrial fibrillation (HCC)    a. Dx 09/2013.   Bladder disorder    Bradycardia 08/2012   a. Eval for symptomatic bradycardia 08/2012 - beta blocker discontinued.   BREAST MASS, BENIGN    CAD (coronary artery disease)    a. s/p PCI to RCA '01. b. repeat PCI to RCA '03 2/2 ISR. c. cath 2012: RCA dz, rx medically.  Occluded RCA.  Nonobstructive disease elsewhere   Chronic combined systolic and diastolic CHF (congestive heart failure) (HCC)    a. EF 40-45% in 2014. b. EF reported to be normal in 03/2015 nuc.   CKD (chronic kidney disease), stage III (HCC)    DIVERTICULOSIS, COLON    Epistaxis    Esophageal reflux    History of breast lump/mass excision 05/23/2009   Qualifier: History of  By: Lovell Sheehan MD, Balinda Quails    HYPERLIPIDEMIA 03/07/2008   HYPERTENSION 08/03/2007   HYPOTHYROIDISM    Internal hemorrhoids    Interstitial cystitis    LBBB (left bundle branch block)    Orthostatic hypotension 08/2012   a. 08/2012 w/ syncope - meds adjusted.    Right hand fracture     Assessment: 88 y/o F with suspected NSTEMI. Starting heparin. She is on apixaban PTA for afib. Holding apixaban while on heparin. It has been >12 hours since last apixaban dose, will start heparin now.  Anticipate using aPTT to dose for now. Labs above reviewed. PTA meds reviewed.   Goal of Therapy:  Heparin level 0.3-0.7 units/ml aPTT 66-102 seconds Monitor platelets by anticoagulation protocol: Yes   Plan:  Start heparin drip at 750 units/hr 1300 heparin level and aPTT Daily CBC, heparin level, and aPTT Monitor for bleeding  Abran Duke, PharmD, BCPS Clinical Pharmacist Phone: [phone]

## 2022-05-24 NOTE — Consult Note (Signed)
Palliative Medicine Inpatient Consult Note  Consulting Provider: Eppie Gibson  Reason for consult:   [facility] Consult  Reason for Consult? Late presenting MI. Now in cardiogenic shock. Wants to be treated medically   05/24/2022  HPI:  Per intake H&P --> Patient was admitted overnight for NSTEMI after presenting with chest pain. Patient tells me she has been having epigastric/chest pain that radiates to left side intermittent for about a week. These symptoms started after a fall at home. She states she was standing talking to her son last week when her legs when numb and she loss consciousness and then fell hitting her hip on her son's dresser. She then fell to the ground. She denies any significant hip pain but states she has had intermittent epigastric/chest pain since then.  Palliative care was asked to get involved in the setting of his significant NSTEMI to further address goals of care.  Clinical Assessment/Goals of Care:  *Please note that this is a verbal dictation therefore any spelling or grammatical errors are due to the "[employer] One" system interpretation.  I have reviewed medical records including EPIC notes, labs and imaging, received report from bedside RN, assessed the patient who was lying in bed in NAD quite fatigued but appropriate.    I met with Montez Morita to further discuss diagnosis prognosis, GOC, EOL wishes, disposition and options.   I introduced Palliative Medicine as specialized medical care for people living with serious illness. It focuses on providing relief from the symptoms and stress of a serious illness. The goal is to improve quality of life for both the patient and the family.  Medical History Review and Understanding:  Deanie and I reviewed her history of atrial fibrillation, coronary artery disease, chronic kidney disease, arthritis, hyperlipidemia, hypertension, hypothyroidism and heart  failure.  Social History:  Katharina shares with me that she is originally from [place].  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  She is a widow as her husband is deceased.  She has 3 sons, 6 grandchildren and 1 great grandchild.  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  She got great joy out of providing support to people and knee and was very active within her back discharge.  Functional and Nutritional State:  Prior to admission Bryana was living in a single-family home with her son Liliane Channel who has been with her since his divorce.  Lahna shares that she was able to perform all B ADLs independently.  Prior to admission Tyannah expresses that she had a decent appetite.  Advance Directives:  A detailed discussion was had today regarding advanced directives.  Mera has no advanced directives on file.  Code Status:  Keerstin shares with me that she spoke to the cardiology team and has established the desire to be a DO NOT RESUSCITATE/DO NOT INTUBATE CODE STATUS.  Discussion:  A comprehensive discussion was had with [facility] in the setting of her NSTEMI.  She is aware that presently she will receive medication therapy through dobutamine and heparin.  She shares that she knows her likelihood of recovery is poor.  Clemence vocalizes that she knows per discussion with cardiology she is a very low likelihood of making it out of the hospital from this acute event.  For the time being Hatsue would like to at least try medical  therapy to see if improvements could be made.  We discussed worst-case scenarios whereby improvements were not made and Shikita continued to decline.  At that point in time she would desire that comfort measures were instituted and she passed in a dignified and peaceful manner.  For the time being watchful waiting will be instituted to see if improvement in Alani's condition can be  made  Discussed the importance of continued conversation with family and their  medical providers regarding overall plan of care and treatment options, ensuring decisions are within the context of the patients values and GOCs.  _____________________________ Addendum:  I called patient's son, Liliane Channel this afternoon and reviewed the above conversation.  He shares that he is calling family to come visit with her and expresses that if he needs additional support he will reach out to the palliative care team.  Liliane Channel verbally expresses acknowledgment and understanding of the plan.  Decision Maker: Shirline can make decisions for herself though if she were unable to she will rely on her son, Liliane Channel.  SUMMARY OF RECOMMENDATIONS   DNAR/DNI  Continue with present medical management of NSTEMI  Patient and son aware of her high risk for rapid decline -->  if this occurred would want to change focus to comfort and allow to pass away peacefully  Palliative care will continue to offer support throughout hospitalization  Code Status/Advance Care Planning: DNAR/DNI  Palliative Prophylaxis:  Aspiration, Bowel Regimen, Delirium Protocol, Frequent Pain Assessment, Oral Care, Palliative Wound Care, and Turn Reposition  Additional Recommendations (Limitations, Scope, Preferences): Continue current care  Psycho-social/Spiritual:  Desire for further Chaplaincy support: Yes Additional Recommendations: Education on NSTEMI and possible progression of symptoms   Prognosis: Very poor overall very high likelihood of clinical decline  Discharge Planning: Discharge plan is not certain at this time   Vitals:   05/24/22 0959 05/24/22 1130  BP: 97/86 (!) 119/94  Pulse: 93 93  Resp: (!) 26 20  Temp: 97.6 F (36.4 C) 97.6 F (36.4 C)  SpO2: 100% 100%    Intake/Output Summary (Last 24 hours) at 05/24/2022 1321 Last data filed at 05/24/2022 1101 Gross per 24 hour  Intake 200 ml  Output 100 ml  Net 100 ml    Last Weight  Most recent update: 05/24/2022 11:28 AM    Weight  61.7 kg (136 lb)            Gen:  Frail elderly F in NAD HEENT: moist mucous membranes CV: Regular rate and rhythm  PULM: ON 3LPM [place], breathing even and nonlabored ABD: soft/nontender  EXT: No edema  Neuro: Alert and oriented x3 - Hard of hearing  PPS: 40%   This conversation/these recommendations were discussed with patient primary care team, Dr. Johney Frame  Billing based on MDM: High  Problems Addressed: One acute or chronic illness or injury that poses a threat to life or bodily function  Amount and/or Complexity of Data: Category 3:Discussion of management or test interpretation with external physician/other qualified health care professional/appropriate source (not separately reported)  Risks: Decision not to resuscitate or to de-escalate care because of poor prognosis ______________________________________________________ [facility] Team Team Cell Phone: [phone] Please utilize secure chat with additional questions, if there is no response within 30 minutes please call the above phone number  Palliative Medicine Team providers are available by phone from 7am to 7pm daily and can be reached through the team cell phone.  Should this patient require assistance outside of these hours,  please call the patient's attending physician.

## 2022-05-24 NOTE — Progress Notes (Signed)
ANTICOAGULATION CONSULT NOTE - Initial Consult  Pharmacy Consult for Heparin (Apixaban on hold) Indication: chest pain/ACS and atrial fibrillation  Allergies  Allergen Reactions   Codeine Hives and Nausea And Vomiting   Sulfonamide Derivatives Swelling    Turns red    Vital Signs: Temp: 97.6 F (36.4 C) (07/15 1130) Temp Source: Oral (07/15 1130) BP: 119/94 (07/15 1130) Pulse Rate: 93 (07/15 1130)  Labs: Recent Labs    June 17, 2022 2301 05/24/22 0211 05/24/22 1032 05/24/22 1303  HGB 10.6* 11.2*  --   --   HCT 32.9* 35.4*  --   --   PLT 203 212  --   --   APTT  --   --   --  34  LABPROT  --  15.6*  --   --   INR  --  1.3*  --   --   HEPARINUNFRC  --   --   --  0.61  CREATININE 1.06* 1.16* 1.51* 1.58*  TROPONINIHS 1,356* 1,350*  --   --      Estimated Creatinine Clearance: 18.4 mL/min (A) (by C-G formula based on SCr of 1.58 mg/dL (H)).   Medical History: Past Medical History:  Diagnosis Date   Arthritis    Atrial fibrillation (HCC)    a. Dx 09/2013.   Bladder disorder    Bradycardia 08/2012   a. Eval for symptomatic bradycardia 08/2012 - beta blocker discontinued.   BREAST MASS, BENIGN    CAD (coronary artery disease)    a. s/p PCI to RCA '01. b. repeat PCI to RCA '03 2/2 ISR. c. cath 2012: RCA dz, rx medically.  Occluded RCA.  Nonobstructive disease elsewhere   Chronic combined systolic and diastolic CHF (congestive heart failure) (HCC)    a. EF 40-45% in 2014. b. EF reported to be normal in 03/2015 nuc.   CKD (chronic kidney disease), stage III (HCC)    DIVERTICULOSIS, COLON    Epistaxis    Esophageal reflux    History of breast lump/mass excision 05/23/2009   Qualifier: History of  By: Lovell Sheehan MD, Balinda Quails    HYPERLIPIDEMIA 03/07/2008   HYPERTENSION 08/03/2007   HYPOTHYROIDISM    Internal hemorrhoids    Interstitial cystitis    LBBB (left bundle branch block)    Orthostatic hypotension 08/2012   a. 08/2012 w/ syncope - meds adjusted.    Right hand fracture      Assessment: 88 y/o F with suspected NSTEMI. Starting heparin. She is on apixaban PTA for afib. Holding apixaban while on heparin. It has been >12 hours since last apixaban dose, will start heparin now. Anticipate using aPTT to dose for now. Labs above reviewed. PTA meds reviewed.   Heparin level 0.61 but likely falsely elevated from recent Eliquis.  APTT below goal at 34.  No known issues with IV infusion.  No overt bleeding or complications noted.  Goal of Therapy:  Heparin level 0.3-0.7 units/ml aPTT 66-102 seconds Monitor platelets by anticoagulation protocol: Yes   Plan:  Start heparin drip 900 units/hr Recheck heparin level in 8 hrs. Daily CBC, heparin level, and aPTT Monitor for bleeding  Reece Leader, Loura Back BCPS, BCCP Clinical Pharmacist  05/24/2022 3:14 PM   [facility] pharmacy phone numbers are listed on [email or web address]

## 2022-05-24 NOTE — Progress Notes (Signed)
Echocardiogram 2D Echocardiogram has been performed.  Victoria Gomez 05/24/2022, 10:33 AM

## 2022-05-25 DIAGNOSIS — R570 Cardiogenic shock: Secondary | ICD-10-CM

## 2022-05-25 DIAGNOSIS — I482 Chronic atrial fibrillation, unspecified: Secondary | ICD-10-CM

## 2022-05-25 DIAGNOSIS — I50814 Right heart failure due to left heart failure: Secondary | ICD-10-CM

## 2022-05-25 DIAGNOSIS — I2109 ST elevation (STEMI) myocardial infarction involving other coronary artery of anterior wall: Secondary | ICD-10-CM

## 2022-05-25 DIAGNOSIS — I214 Non-ST elevation (NSTEMI) myocardial infarction: Secondary | ICD-10-CM | POA: Diagnosis not present

## 2022-05-25 LAB — CBC
HCT: 40.2 % (ref 36.0–46.0)
Hemoglobin: 12.4 g/dL (ref 12.0–15.0)
MCH: 29.9 pg (ref 26.0–34.0)
MCHC: 30.8 g/dL (ref 30.0–36.0)
MCV: 96.9 fL (ref 80.0–100.0)
Platelets: 243 10*3/uL (ref 150–400)
RBC: 4.15 MIL/uL (ref 3.87–5.11)
RDW: 14 % (ref 11.5–15.5)
WBC: 9 10*3/uL (ref 4.0–10.5)
nRBC: 0 % (ref 0.0–0.2)

## 2022-05-25 LAB — BASIC METABOLIC PANEL
Anion gap: 18 — ABNORMAL HIGH (ref 5–15)
BUN: 34 mg/dL — ABNORMAL HIGH (ref 8–23)
CO2: 16 mmol/L — ABNORMAL LOW (ref 22–32)
Calcium: 8.7 mg/dL — ABNORMAL LOW (ref 8.9–10.3)
Chloride: 108 mmol/L (ref 98–111)
Creatinine, Ser: 2.26 mg/dL — ABNORMAL HIGH (ref 0.44–1.00)
GFR, Estimated: 20 mL/min — ABNORMAL LOW (ref >60)
Glucose, Bld: 192 mg/dL — ABNORMAL HIGH (ref 70–99)
Potassium: 5 mmol/L (ref 3.5–5.1)
Sodium: 142 mmol/L (ref 135–145)

## 2022-05-25 LAB — LIPOPROTEIN A (LPA): Lipoprotein (a): 38.1 nmol/L — ABNORMAL HIGH (ref <75.0)

## 2022-05-25 LAB — APTT: aPTT: 185 seconds (ref 24–36)

## 2022-05-25 MED ORDER — HEPARIN (PORCINE) 25000 UT/250ML-% IV SOLN: 750.0000 [IU]/h | INTRAVENOUS | Status: DC

## 2022-05-25 MED ORDER — HALOPERIDOL LACTATE 5 MG/ML IJ SOLN
2.0000 mg | Freq: Once | INTRAMUSCULAR | Status: AC
  Administered 2022-05-25: 2 mg via INTRAMUSCULAR
  Filled 2022-05-25: qty 1

## 2022-06-02 ENCOUNTER — Ambulatory Visit: Payer: Medicare Other | Admitting: Family Medicine

## 2022-06-10 NOTE — Progress Notes (Signed)
Pt HR dropped to 50s at 0622 and rapidly declined, respirations agonal, peripheral pulses not palpable, Pt non responsive to painful stimului, pupils fixed and not responding to light. Dr Filiberto Pinks was paged multiple times and came to bedside throughout the night about various changes in vital signs. Dr Rosita Fire came to bedside at 0630 when notified of rapid decline and pronounced time of death upon assessment.

## 2022-06-10 NOTE — Death Summary Note (Addendum)
Death Summary    Patient ID: Victoria Gomez MRN: [id number]; DOB: 1925-01-17  Admit Date: Jun 12, 2022 Date of Death: 06/14/22 at 6:37am  Primary Care Provider: Marin Olp, MD  Primary Cardiologist: Dr. Minus Breeding Primary Electrophysiologist:  None   Discharge Diagnoses    Principal Problem:   Suspect late presenting anterior MI Active Problems:   Biventricular heart failure with reduced left ventricular function (HCC)   Cardiogenic shock ([place])   Permanent atrial fibrillation ([place])   Hypertension   Type 2 diabetes mellitus with complication, without long-term current use of insulin ([place])   Hyperlipidemia   Hypothyroidism    Diagnostic Studies/Procedures    Echocardiogram 14-Jun-2022: Impressions:  1. Echo findings consistent with severe biventricular failure with severely elevated filling pressures. Wall motion concerning for LAD infarction.   2. Left ventricular ejection fraction, by estimation, is <20%. The left ventricle has severely decreased function. The left ventricle demonstrates regional wall motion abnormalities (see scoring diagram/findings for  description). There is severe global hypokinesis with akinesis of all anterior, all septal, mid-to-apical inferior wall segments and apex. Diastolic function indeterminant due to Afib.   3. Right ventricular systolic function is severely reduced. The right ventricular size is normal.   4. Left atrial size was severely dilated.   5. Right atrial size was severely dilated.   6. A small pericardial effusion is present. The pericardial effusion is anterior to the right ventricle. Moderate pleural effusion in the left lateral region.   7. The mitral valve is degenerative. Mild to moderate mitral valve regurgitation. Severe mitral annular calcification.   8. Tricuspid valve regurgitation is mild to moderate.   9. The aortic valve is tricuspid. There is mild calcification of the aortic valve. There is moderate thickening of  the aortic valve. Aortic valve regurgitation is mild. Aortic valve sclerosis/calcification is  present, without any evidence of aortic stenosis.  10. Aortic dilatation noted. There is borderline dilatation of the ascending aorta, measuring 37 mm.  11. The inferior vena cava is dilated in size with <50% respiratory  variability, suggesting right atrial pressure of 15 mmHg.  _____________   History of Present Illness     Victoria Gomez is a 88 y.o. female with a history of CAD s/p prior PCI to proximal RCA and LAD with known CTO of proximal RCA within prior stented area with collaterals noted on last cardiac catheterization in 2018 (tread medically), chronic CHF with mildly reduced EF of 40-45% on Echo in 2018, permanent atrial fibrillation on Eliquis, hypertension, hyperlipidemia, type 2 diabetes mellitus, hypothyroidism, and CKD stage III who presented to the [facility] ED in the late hours of 2022/06/12 with chest pain and ruled in for NSTEMI.  Patient told the admitting provider that she was in her usual state of health until earlier yesterday when she developed sudden onset of left sided chest pressure at the rest. She said that the pain was transient and went away on its own. However, later that day, the pain recurred and was more severe and radiated to her left arm. She denied any other associated symptoms including shortness of breath, nausea, vomiting, diaphoresis, or focal weakness. She also denied any fevers, chills, PND, orthopnea, or swelling. She stated she had never had chest pain this bad before.  In the ED, vitals stable. EKG showed atrial fibrillation, rate 85 bpm, with Q waves in inferior and anterior leads and LBBB. High-sensitivity troponin elevated at 1,356 >> 1,350. BNP elevated at 1,666.5. Chest x-ray showed mild  congestive CHF. WBC 6.0, Hgb 10.6, Plts 203. Na 143, K 2.7, Glucose 122, Cr 1.06. Magnesium 1.9. She had recurrent chest pain while in the ED. She was initially treated  with Nitropaste but then switched to IV Nitroglycerin. She was started on IV Heparin and admitted for further management of NSTEMI. Potassium was also repleted with improvement.  Hospital Course   Suspected Late Presenting Anterior MI Cardiogenic Shock Acute Biventricular Failure Patient was admitted with suspected NSTEMI on 05/20/2022 as stated above. She continued to have progressive chest pain in the ED and IV Nitroglycerin was unable to be up-titrated due to soft BP with systolic BP in the 0000000. At one point in the ED, she started crying due to pain in her chest and stated "I'm going to die." She was moved out of the ED and up to the floor shortly after this. Upon arrival to the floor, she stated she was still having chest pain and nausea but appeared more comfortable. However, she was cold on exam and was intermittently somnolent (although could be aroused easily). Patient initially told admitting provider that she started having pain the day prior to admission. However, when she arrived up on the floor, she revealed she actually had been having intermittent epigastric/ chest pain that radiated to her left side for about a week. She stated these symptoms started after she had a syncopal episode and fall at home. Based on exam and this new information, there was concern that she likely had an ACS event about a week ago and was now in cardiogenic shock. Echo was performed and showed severe biventricular failure with LVEF of <20 with severe global hypokinesis as well as akinesis of all anterior, all septal, mid to apical inferior wall segment, and apex. These findings were also suggestive that she likely had an anterior MI about 1 week prior to admission. Lactic acid was checked and came back at 4.5. Patient was discussed with on-call STEMI MD (Dr. Irish Lack) and chance of survival based on available data was felt to be very low. Dr. Johney Frame had a long discussion with both patient and son and decision was  made to avoid any invasive procedure including cardiac catheterization and treat medically. She was made DNR/DNI. IV Nitroglycerin was stopped and she was started on IV Dobutamine and diuresed with IV Lasix. Palliative Care was consulted as well to assist with end of life discussions as there was felt to be a high change of in-hospital death. Patient elected to continue with IV Dobutamine but stated she would desire to move to comfort measures if she continued to decline. Lactic acid improved some after being started on Dobutamine but remained elevated and renal function continued to worsen. Patient continued to have recurrent chest pain throughout the day which was treated with IV Morphine and sublingual Nitroglycerin. She continued to gradually decline overnight. Dr. Laurice Record (Cardiology overnight fellow) was notified that patient had died on morning of 05/31/22. He went to bedside and on arrival patient was pale and not arousable to verbal or tactile stimuli. Her pupils were dilated and non-reactive to light. She had no respiratory effort or peripheral pulses. Time of death was called at 6:37am on 2022-05-31. Family was notified.  Consultants: Palliative Care   Time of death: 6:37am on 2022/05/31 _____________  Labs & Radiologic Studies    CBC Recent Labs    05/22/2022 2301 05/24/22 0211 05-31-2022 0111  WBC 6.0 7.3 9.0  NEUTROABS 3.7  --   --   HGB  10.6* 11.2* 12.4  HCT 32.9* 35.4* 40.2  MCV 92.4 94.4 96.9  PLT 203 212 243   Basic Metabolic Panel Recent Labs    62/22/97 2301 05/24/22 0211 05/24/22 1032 05/24/22 1303 May 30, 2022 0111  NA 143 143   < > 142 142  K 2.7* 3.3*   < > 5.1 5.0  CL 109 107   < > 106 108  CO2 23 24   < > 22 16*  GLUCOSE 122* 129*   < > 175* 192*  BUN 21 22   < > 28* 34*  CREATININE 1.06* 1.16*   < > 1.58* 2.26*  CALCIUM 7.9* 8.5*   < > 8.8* 8.7*  MG 1.9 2.0  --   --   --    < > = values in this interval not displayed.   Liver Function Tests No results for  input(s): "AST", "ALT", "ALKPHOS", "BILITOT", "PROT", "ALBUMIN" in the last 72 hours. No results for input(s): "LIPASE", "AMYLASE" in the last 72 hours. High Sensitivity Troponin:   Recent Labs  Lab 05/26/2022 2301 05/24/22 0211  TROPONINIHS 1,356* 1,350*    BNP Invalid input(s): "POCBNP" D-Dimer No results for input(s): "DDIMER" in the last 72 hours. Hemoglobin A1C Recent Labs    05/24/22 0211  HGBA1C 6.1*   Fasting Lipid Panel Recent Labs    05/24/22 0211  CHOL 144  HDL 40*  LDLCALC 87  TRIG 86  CHOLHDL 3.6   Thyroid Function Tests Recent Labs    05/24/22 1032  TSH 2.140   _____________  ECHOCARDIOGRAM COMPLETE  Result Date: 05/24/2022    ECHOCARDIOGRAM REPORT   Patient Name:   YOLUNDA KLOOS Date of Exam: 05/24/2022 Medical Rec #:  [id number]      Height:       62.0 in Accession #:    [id number]     Weight:       142.0 lb Date of Birth:  [birth year]      BSA:          1.653 m Patient Age:    89 years       BP:           97/86 mmHg Patient Gender: F              HR:           87 bpm. Exam Location:  Inpatient Procedure: 2D Echo, Cardiac Doppler and Color Doppler Indications:    Nstemi  History:        Patient has prior history of Echocardiogram examinations, most                 recent 09/14/2017. CAD, Arrythmias:Atrial Fibrillation; Risk                 Factors:Hypertension, Diabetes and HLD.  Sonographer:    Rodrigo Ran RCS Referring Phys: [id number] Karl Ito IMPRESSIONS  1. Echo findings consistent with severe biventricular failure with severely elevated filling pressures. Wall motion concerning for LAD infarction.  2. Left ventricular ejection fraction, by estimation, is <20%. The left ventricle has severely decreased function. The left ventricle demonstrates regional wall motion abnormalities (see scoring diagram/findings for description). There is severe global hypokinesis with akinesis of all anterior, all septal, mid-to-apical inferior wall segments and apex.  Diastolic function indeterminant due to Afib.  3. Right ventricular systolic function is severely reduced. The right ventricular size is normal.  4. Left atrial size was severely dilated.  5. Right atrial size was  severely dilated.  6. A small pericardial effusion is present. The pericardial effusion is anterior to the right ventricle. Moderate pleural effusion in the left lateral region.  7. The mitral valve is degenerative. Mild to moderate mitral valve regurgitation. Severe mitral annular calcification.  8. Tricuspid valve regurgitation is mild to moderate.  9. The aortic valve is tricuspid. There is mild calcification of the aortic valve. There is moderate thickening of the aortic valve. Aortic valve regurgitation is mild. Aortic valve sclerosis/calcification is present, without any evidence of aortic stenosis. 10. Aortic dilatation noted. There is borderline dilatation of the ascending aorta, measuring 37 mm. 11. The inferior vena cava is dilated in size with <50% respiratory variability, suggesting right atrial pressure of 15 mmHg. FINDINGS  Left Ventricle: There is severe global hypokinesis with akinesis of all anterior and septal walls, apical inferior and apex. Left ventricular ejection fraction, by estimation, is <20%. The left ventricle has severely decreased function. The left ventricle demonstrates regional wall motion abnormalities. The left ventricular internal cavity size was normal in size. There is no left ventricular hypertrophy. Diastolic function indeterminant due to Afib. Right Ventricle: The right ventricular size is normal. No increase in right ventricular wall thickness. Right ventricular systolic function is severely reduced. Left Atrium: Left atrial size was severely dilated. Right Atrium: Right atrial size was severely dilated. Pericardium: A small pericardial effusion is present. The pericardial effusion is anterior to the right ventricle. Mitral Valve: The mitral valve is degenerative  in appearance. There is moderate thickening of the mitral valve leaflet(s). There is moderate calcification of the mitral valve leaflet(s). Severe mitral annular calcification. Mild to moderate mitral valve regurgitation. Tricuspid Valve: The tricuspid valve is normal in structure. Tricuspid valve regurgitation is mild to moderate. Aortic Valve: The aortic valve is tricuspid. There is mild calcification of the aortic valve. There is moderate thickening of the aortic valve. Aortic valve regurgitation is mild. Aortic regurgitation PHT measures 455 msec. Aortic valve sclerosis/calcification is present, without any evidence of aortic stenosis. Aortic valve mean gradient measures 2.0 mmHg. Aortic valve peak gradient measures 4.4 mmHg. Aortic valve area, by VTI measures 1.19 cm. Pulmonic Valve: The pulmonic valve was normal in structure. Pulmonic valve regurgitation is mild. Aorta: Aortic dilatation noted. There is borderline dilatation of the ascending aorta, measuring 37 mm. Venous: The inferior vena cava is dilated in size with less than 50% respiratory variability, suggesting right atrial pressure of 15 mmHg. IAS/Shunts: The atrial septum is grossly normal. Additional Comments: There is a moderate pleural effusion in the left lateral region.  LEFT VENTRICLE PLAX 2D LVIDd:         4.50 cm LVIDs:         4.20 cm LV PW:         0.70 cm LV IVS:        1.10 cm LVOT diam:     1.70 cm LV SV:         21 LV SV Index:   13 LVOT Area:     2.27 cm  LV Volumes (MOD) LV vol d, MOD A2C: 87.3 ml LV vol d, MOD A4C: 49.0 ml LV vol s, MOD A2C: 60.7 ml LV vol s, MOD A4C: 43.8 ml LV SV MOD A2C:     26.6 ml LV SV MOD A4C:     49.0 ml LV SV MOD BP:      15.1 ml RIGHT VENTRICLE            IVC RV  Basal diam:  2.90 cm    IVC diam: 2.40 cm RV Mid diam:    2.30 cm RV S prime:     6.86 cm/s TAPSE (M-mode): 1.0 cm LEFT ATRIUM             Index        RIGHT ATRIUM           Index LA diam:        4.90 cm 2.96 cm/m   RA Area:     14.40 cm LA Vol  (A2C):   64.9 ml 39.27 ml/m  RA Volume:   30.50 ml  18.45 ml/m LA Vol (A4C):   48.2 ml 29.16 ml/m LA Biplane Vol: 57.0 ml 34.49 ml/m  AORTIC VALVE                    PULMONIC VALVE AV Area (Vmax):    1.35 cm     PR End Diast Vel: 8.88 msec AV Area (Vmean):   1.20 cm AV Area (VTI):     1.19 cm AV Vmax:           105.00 cm/s AV Vmean:          71.900 cm/s AV VTI:            0.181 m AV Peak Grad:      4.4 mmHg AV Mean Grad:      2.0 mmHg LVOT Vmax:         62.53 cm/s LVOT Vmean:        37.900 cm/s LVOT VTI:          0.095 m LVOT/AV VTI ratio: 0.52 AI PHT:            455 msec  AORTA Ao Root diam: 3.10 cm Ao Asc diam:  3.70 cm MITRAL VALVE                TRICUSPID VALVE MV Area (PHT): 10.25 cm    TR Peak grad:   25.4 mmHg MV Decel Time: 74 msec      TR Vmax:        252.00 cm/s MV E velocity: 118.00 cm/s MV A velocity: 54.00 cm/s   SHUNTS MV E/A ratio:  2.19         Systemic VTI:  0.09 m                             Systemic Diam: 1.70 cm Laurance Flatten MD Electronically signed by Laurance Flatten MD Signature Date/Time: 05/24/2022/1:33:31 PM    Final    DG Chest Port 1 View  Result Date: 22-Jun-2022 CLINICAL DATA:  Worsening chest pain, cough EXAM: PORTABLE CHEST 1 VIEW COMPARISON:  12/03/2021 FINDINGS: Single frontal view of the chest demonstrates stable enlargement the cardiac silhouette. Increased central vascular congestion, with progressive bibasilar interstitial and ground-glass opacities consistent with mild edema. Small bilateral effusions. No pneumothorax. No acute bony abnormalities. IMPRESSION: 1. Mild congestive heart failure. Electronically Signed   By: Sharlet Salina M.D.   On: 2022-06-22 23:41    Duration of Death Summary Encounter   Greater than 30 minutes including physician time.  Signed, Corrin Parker, PA-C 2022-06-24, 8:20 AM   Patient seen yesterday prior to passing away and agree with Marjie Skiff, PA-C as detailed above.   In brief, the patient is a 88 year old  female with history of CAD s/p PCI to RCA, chronic  CHF with mildly reduced EF of 40-45%, permanent atrial fibrillation on Eliquis, thoracic aortic aneurysm, hypertension, hyperlipidemia, type 2 diabetes mellitus, CKD stage III, and hypothyroidism who presented to the ER with NSTEMI found to be in cardiogenic shock with biventricular failure. Discussed with the patient, family and interventional cardiology, given the degree of biventricular dysfunction, concern for late presentation of STEMI, and low likelihood of significant LV recovery, the decision was made to pursue medical management. She was seen by the palliative care team and transition to DNR. She passed away this morning from cardiogenic shock, severe biventricular failure and late presentation of STEMI. Family was notified.   Gwyndolyn Kaufman, MD

## 2022-06-10 NOTE — Progress Notes (Addendum)
Post Mortem assessment completed, Called to [facility], Family (Son) was in bed side this morning and but stepping out now to call his rest of the family members. Pt's body is in bed, waiting for family to clear [place] to send her to our hospital [facility], will find out about their [place] home, patient placement notified about the incident. Cause of death is Cardiogenic shock and anterior MI, double confirmed with PA Irene Limbo to fill out in a evaluation for donation of an anatomical gift form.  Lonia Farber, RN

## 2022-06-10 NOTE — Progress Notes (Signed)
Pt awoke confused and complained of difficulty breathing. When nursing staff attempted to assess and apply oxygen, she became more agitated and pulled out one of her IV sites. Dr Filiberto Pinks was notified and IM haloperidol was given. Pt became more relaxed and went back to sleep. RN will continue to monitor and keep her comfortable.

## 2022-06-10 NOTE — Plan of Care (Signed)
I was called by staff for pronouncement of death of Tifani Dack. I identified patient by hospital ID wristband. On exam, she appeared pale. She was not rousable to verbal or tactile stimuli. Her pupils were dilated and non-reactive to light. No respiratory effort was noted. Peripheral pulses were absent. Time of death was recorded as 06:37 2022-05-26. I called patient's son Raiford Noble Carder) and I confirmed her death with him. Mr. Stacy Gardner will notify his brother Josefina Do) and plan to come to the hospital. I notified the team.

## 2022-06-10 NOTE — Progress Notes (Signed)
   Palliative Medicine Inpatient Follow Up Note   I stopped in this morning to check in on Victoria Gomez.  Nursing was cleansing her as moments before my entry she had passed away.  Palliative Care will be available if additional support is needed.  No Charge ______________________________________________________________________________________ Lamarr Lulas [facility] Palliative Medicine Team Team Cell Phone: [phone] Please utilize secure chat with additional questions, if there is no response within 30 minutes please call the above phone number  Palliative Medicine Team providers are available by phone from 7am to 7pm daily and can be reached through the team cell phone.  Should this patient require assistance outside of these hours, please call the patient's attending physician.

## 2022-06-10 NOTE — Progress Notes (Signed)
Pt IV heparin stopped due to loss of IV access. Pharmacist notified of loss of IV access and plan for comfort care later today. Pharmacist instructed nurse to hold heparin until day shift MD could assess need for restarting.

## 2022-06-10 NOTE — Progress Notes (Signed)
ANTICOAGULATION CONSULT NOTE   Pharmacy Consult for Heparin (Apixaban on hold) Indication: chest pain/ACS and atrial fibrillation  Allergies  Allergen Reactions   Codeine Hives and Nausea And Vomiting   Sulfonamide Derivatives Swelling    Turns red    Vital Signs: Temp: 98.1 F (36.7 C) (07/16 0033) Temp Source: Oral (07/16 0033) BP: 78/51 (07/16 0033) Pulse Rate: 91 (07/16 0033)  Labs: Recent Labs    06/11/2022 2301 05/24/22 0211 05/24/22 1032 05/24/22 1303 06/01/2022 0111  HGB 10.6* 11.2*  --   --  12.4  HCT 32.9* 35.4*  --   --  40.2  PLT 203 212  --   --  243  APTT  --   --   --  34 185*  LABPROT  --  15.6*  --   --   --   INR  --  1.3*  --   --   --   HEPARINUNFRC  --   --   --  0.61  --   CREATININE 1.06* 1.16* 1.51* 1.58* 2.26*  TROPONINIHS 1,356* 1,350*  --   --   --      Estimated Creatinine Clearance: 12.9 mL/min (A) (by C-G formula based on SCr of 2.26 mg/dL (H)).   Medical History: Past Medical History:  Diagnosis Date   Arthritis    Atrial fibrillation (HCC)    a. Dx 09/2013.   Bladder disorder    Bradycardia 08/2012   a. Eval for symptomatic bradycardia 08/2012 - beta blocker discontinued.   BREAST MASS, BENIGN    CAD (coronary artery disease)    a. s/p PCI to RCA '01. b. repeat PCI to RCA '03 2/2 ISR. c. cath 2012: RCA dz, rx medically.  Occluded RCA.  Nonobstructive disease elsewhere   Chronic combined systolic and diastolic CHF (congestive heart failure) (HCC)    a. EF 40-45% in 2014. b. EF reported to be normal in 03/2015 nuc.   CKD (chronic kidney disease), stage III (HCC)    DIVERTICULOSIS, COLON    Epistaxis    Esophageal reflux    History of breast lump/mass excision 05/23/2009   Qualifier: History of  By: Lovell Sheehan MD, Balinda Quails    HYPERLIPIDEMIA 03/07/2008   HYPERTENSION 08/03/2007   HYPOTHYROIDISM    Internal hemorrhoids    Interstitial cystitis    LBBB (left bundle branch block)    Orthostatic hypotension 08/2012   a. 08/2012 w/  syncope - meds adjusted.    Right hand fracture     Assessment: 88 y/o F with suspected NSTEMI. Starting heparin. She is on apixaban PTA for afib. Holding apixaban while on heparin. It has been >12 hours since last apixaban dose, will start heparin now. Anticipate using aPTT to dose for now. Labs above reviewed. PTA meds reviewed.   7/16 AM update:  aPTT supra-therapeutic   Goal of Therapy:  Heparin level 0.3-0.7 units/ml aPTT 66-102 seconds Monitor platelets by anticoagulation protocol: Yes   Plan:  Hold heparin x 1 hr Re-start heparin at 750 units/hr 1200 heparin level and aPTT  Abran Duke, PharmD, BCPS Clinical Pharmacist Phone: [phone]

## 2022-06-10 DEATH — deceased
